# Patient Record
Sex: Female | Born: 1945 | Race: Black or African American | Hispanic: No | Marital: Married | Smoking: Former smoker
Health system: Southern US, Community
[De-identification: ages and names within clinical notes are randomized; demographics above are authoritative.]

## PROBLEM LIST (undated history)

## (undated) DIAGNOSIS — C801 Malignant (primary) neoplasm, unspecified: Secondary | ICD-10-CM

## (undated) DIAGNOSIS — M7500 Adhesive capsulitis of unspecified shoulder: Secondary | ICD-10-CM

## (undated) DIAGNOSIS — D649 Anemia, unspecified: Secondary | ICD-10-CM

## (undated) DIAGNOSIS — K219 Gastro-esophageal reflux disease without esophagitis: Secondary | ICD-10-CM

## (undated) DIAGNOSIS — E119 Type 2 diabetes mellitus without complications: Secondary | ICD-10-CM

## (undated) DIAGNOSIS — D638 Anemia in other chronic diseases classified elsewhere: Secondary | ICD-10-CM

## (undated) DIAGNOSIS — M75101 Unspecified rotator cuff tear or rupture of right shoulder, not specified as traumatic: Secondary | ICD-10-CM

## (undated) DIAGNOSIS — Z973 Presence of spectacles and contact lenses: Secondary | ICD-10-CM

## (undated) DIAGNOSIS — H409 Unspecified glaucoma: Secondary | ICD-10-CM

## (undated) DIAGNOSIS — E785 Hyperlipidemia, unspecified: Secondary | ICD-10-CM

## (undated) DIAGNOSIS — K222 Esophageal obstruction: Secondary | ICD-10-CM

## (undated) DIAGNOSIS — F419 Anxiety disorder, unspecified: Secondary | ICD-10-CM

## (undated) DIAGNOSIS — I10 Essential (primary) hypertension: Secondary | ICD-10-CM

## (undated) DIAGNOSIS — K449 Diaphragmatic hernia without obstruction or gangrene: Secondary | ICD-10-CM

## (undated) DIAGNOSIS — M1A9XX0 Chronic gout, unspecified, without tophus (tophi): Secondary | ICD-10-CM

## (undated) DIAGNOSIS — Z5189 Encounter for other specified aftercare: Secondary | ICD-10-CM

## (undated) HISTORY — DX: Diaphragmatic hernia without obstruction or gangrene: K44.9

## (undated) HISTORY — PX: COLONOSCOPY: SHX174

## (undated) HISTORY — PX: TUBAL LIGATION: SHX77

## (undated) HISTORY — DX: Anemia in other chronic diseases classified elsewhere: D63.8

## (undated) HISTORY — DX: Chronic gout, unspecified, without tophus (tophi): M1A.9XX0

## (undated) HISTORY — DX: Unspecified glaucoma: H40.9

## (undated) HISTORY — DX: Adhesive capsulitis of unspecified shoulder: M75.00

## (undated) HISTORY — DX: Unspecified rotator cuff tear or rupture of right shoulder, not specified as traumatic: M75.101

## (undated) HISTORY — DX: Encounter for other specified aftercare: Z51.89

## (undated) HISTORY — DX: Hyperlipidemia, unspecified: E78.5

## (undated) HISTORY — DX: Malignant (primary) neoplasm, unspecified: C80.1

## (undated) HISTORY — DX: Anemia, unspecified: D64.9

## (undated) HISTORY — DX: Esophageal obstruction: K22.2

## (undated) HISTORY — PX: REDUCTION MAMMAPLASTY: SUR839

## (undated) HISTORY — DX: Gastro-esophageal reflux disease without esophagitis: K21.9

## (undated) HISTORY — PX: OTHER SURGICAL HISTORY: SHX169

---

## 1988-11-18 HISTORY — PX: MASTECTOMY MODIFIED RADICAL: SUR848

## 1989-11-18 DIAGNOSIS — C801 Malignant (primary) neoplasm, unspecified: Secondary | ICD-10-CM

## 1989-11-18 HISTORY — DX: Malignant (primary) neoplasm, unspecified: C80.1

## 1998-11-18 HISTORY — PX: CATARACT EXTRACTION W/ INTRAOCULAR LENS  IMPLANT, BILATERAL: SHX1307

## 2006-10-02 ENCOUNTER — Encounter: Admission: RE | Admit: 2006-10-02 | Discharge: 2006-10-02 | Payer: Self-pay | Admitting: Obstetrics and Gynecology

## 2007-10-05 ENCOUNTER — Encounter: Admission: RE | Admit: 2007-10-05 | Discharge: 2007-10-05 | Payer: Self-pay | Admitting: Obstetrics & Gynecology

## 2008-10-05 ENCOUNTER — Encounter: Admission: RE | Admit: 2008-10-05 | Discharge: 2008-10-05 | Payer: Self-pay | Admitting: Obstetrics & Gynecology

## 2009-04-03 ENCOUNTER — Ambulatory Visit: Payer: Self-pay | Admitting: Internal Medicine

## 2009-04-03 DIAGNOSIS — E119 Type 2 diabetes mellitus without complications: Secondary | ICD-10-CM

## 2009-04-13 ENCOUNTER — Ambulatory Visit: Payer: Self-pay | Admitting: Internal Medicine

## 2009-04-13 ENCOUNTER — Encounter: Payer: Self-pay | Admitting: Internal Medicine

## 2009-04-15 ENCOUNTER — Encounter: Payer: Self-pay | Admitting: Internal Medicine

## 2009-10-09 ENCOUNTER — Encounter: Admission: RE | Admit: 2009-10-09 | Discharge: 2009-10-09 | Payer: Self-pay | Admitting: Obstetrics & Gynecology

## 2009-11-18 HISTORY — PX: EYE SURGERY: SHX253

## 2010-08-14 ENCOUNTER — Ambulatory Visit (HOSPITAL_COMMUNITY): Admission: RE | Admit: 2010-08-14 | Discharge: 2010-08-15 | Payer: Self-pay | Admitting: Ophthalmology

## 2010-10-17 ENCOUNTER — Encounter: Admission: RE | Admit: 2010-10-17 | Discharge: 2010-10-17 | Payer: Self-pay | Admitting: Obstetrics & Gynecology

## 2011-01-31 LAB — GLUCOSE, CAPILLARY
Glucose-Capillary: 111 mg/dL — ABNORMAL HIGH (ref 70–99)
Glucose-Capillary: 122 mg/dL — ABNORMAL HIGH (ref 70–99)
Glucose-Capillary: 247 mg/dL — ABNORMAL HIGH (ref 70–99)
Glucose-Capillary: 248 mg/dL — ABNORMAL HIGH (ref 70–99)

## 2011-01-31 LAB — CBC
MCH: 26.5 pg (ref 26.0–34.0)
MCHC: 32.4 g/dL (ref 30.0–36.0)
Platelets: 367 10*3/uL (ref 150–400)
RDW: 15.2 % (ref 11.5–15.5)

## 2011-01-31 LAB — BASIC METABOLIC PANEL
BUN: 18 mg/dL (ref 6–23)
CO2: 25 mEq/L (ref 19–32)
Calcium: 10 mg/dL (ref 8.4–10.5)
Creatinine, Ser: 0.92 mg/dL (ref 0.4–1.2)
GFR calc non Af Amer: >60 mL/min (ref >60)
Glucose, Bld: 172 mg/dL — ABNORMAL HIGH (ref 70–99)

## 2011-01-31 LAB — SURGICAL PCR SCREEN
MRSA, PCR: NEGATIVE
Staphylococcus aureus: NEGATIVE

## 2011-02-26 LAB — GLUCOSE, CAPILLARY: Glucose-Capillary: 152 mg/dL — ABNORMAL HIGH (ref 70–99)

## 2011-04-18 ENCOUNTER — Encounter: Payer: Self-pay | Admitting: *Deleted

## 2011-09-13 ENCOUNTER — Other Ambulatory Visit: Payer: Self-pay | Admitting: Obstetrics & Gynecology

## 2011-09-13 DIAGNOSIS — Z1231 Encounter for screening mammogram for malignant neoplasm of breast: Secondary | ICD-10-CM

## 2011-10-21 ENCOUNTER — Ambulatory Visit
Admission: RE | Admit: 2011-10-21 | Discharge: 2011-10-21 | Disposition: A | Discharge status: Home / Self Care | Payer: Self-pay | Source: Ambulatory Visit | Attending: Obstetrics & Gynecology | Admitting: Obstetrics & Gynecology

## 2011-10-21 DIAGNOSIS — Z1231 Encounter for screening mammogram for malignant neoplasm of breast: Secondary | ICD-10-CM

## 2011-11-26 ENCOUNTER — Ambulatory Visit (INDEPENDENT_AMBULATORY_CARE_PROVIDER_SITE_OTHER): Payer: Medicare Other | Admitting: Ophthalmology

## 2011-11-26 DIAGNOSIS — E1165 Type 2 diabetes mellitus with hyperglycemia: Secondary | ICD-10-CM

## 2011-11-26 DIAGNOSIS — E1139 Type 2 diabetes mellitus with other diabetic ophthalmic complication: Secondary | ICD-10-CM

## 2011-11-26 DIAGNOSIS — H43819 Vitreous degeneration, unspecified eye: Secondary | ICD-10-CM

## 2011-11-26 DIAGNOSIS — E11359 Type 2 diabetes mellitus with proliferative diabetic retinopathy without macular edema: Secondary | ICD-10-CM

## 2011-11-26 DIAGNOSIS — H33009 Unspecified retinal detachment with retinal break, unspecified eye: Secondary | ICD-10-CM

## 2012-05-25 ENCOUNTER — Ambulatory Visit (INDEPENDENT_AMBULATORY_CARE_PROVIDER_SITE_OTHER): Payer: Medicare Other | Admitting: Ophthalmology

## 2012-05-25 DIAGNOSIS — H33009 Unspecified retinal detachment with retinal break, unspecified eye: Secondary | ICD-10-CM

## 2012-05-25 DIAGNOSIS — H43819 Vitreous degeneration, unspecified eye: Secondary | ICD-10-CM

## 2012-05-25 DIAGNOSIS — E11359 Type 2 diabetes mellitus with proliferative diabetic retinopathy without macular edema: Secondary | ICD-10-CM

## 2012-05-25 DIAGNOSIS — E1139 Type 2 diabetes mellitus with other diabetic ophthalmic complication: Secondary | ICD-10-CM

## 2012-06-25 DIAGNOSIS — H02409 Unspecified ptosis of unspecified eyelid: Secondary | ICD-10-CM | POA: Insufficient documentation

## 2012-09-22 ENCOUNTER — Other Ambulatory Visit: Payer: Self-pay | Admitting: Obstetrics & Gynecology

## 2012-09-22 DIAGNOSIS — Z1231 Encounter for screening mammogram for malignant neoplasm of breast: Secondary | ICD-10-CM

## 2012-09-22 DIAGNOSIS — Z853 Personal history of malignant neoplasm of breast: Secondary | ICD-10-CM

## 2012-09-22 DIAGNOSIS — Z9011 Acquired absence of right breast and nipple: Secondary | ICD-10-CM

## 2012-10-28 ENCOUNTER — Ambulatory Visit
Admission: RE | Admit: 2012-10-28 | Discharge: 2012-10-28 | Disposition: A | Discharge status: Home / Self Care | Payer: Medicare Other | Source: Ambulatory Visit | Attending: Obstetrics & Gynecology | Admitting: Obstetrics & Gynecology

## 2012-10-28 DIAGNOSIS — Z1231 Encounter for screening mammogram for malignant neoplasm of breast: Secondary | ICD-10-CM

## 2012-10-28 DIAGNOSIS — Z9011 Acquired absence of right breast and nipple: Secondary | ICD-10-CM

## 2012-10-28 DIAGNOSIS — Z853 Personal history of malignant neoplasm of breast: Secondary | ICD-10-CM

## 2012-11-25 ENCOUNTER — Ambulatory Visit (INDEPENDENT_AMBULATORY_CARE_PROVIDER_SITE_OTHER): Payer: Medicare Other | Admitting: Ophthalmology

## 2012-11-25 DIAGNOSIS — E1139 Type 2 diabetes mellitus with other diabetic ophthalmic complication: Secondary | ICD-10-CM

## 2012-11-25 DIAGNOSIS — H33009 Unspecified retinal detachment with retinal break, unspecified eye: Secondary | ICD-10-CM

## 2012-11-25 DIAGNOSIS — I10 Essential (primary) hypertension: Secondary | ICD-10-CM

## 2012-11-25 DIAGNOSIS — H35039 Hypertensive retinopathy, unspecified eye: Secondary | ICD-10-CM

## 2012-11-25 DIAGNOSIS — E1165 Type 2 diabetes mellitus with hyperglycemia: Secondary | ICD-10-CM

## 2012-11-25 DIAGNOSIS — H26499 Other secondary cataract, unspecified eye: Secondary | ICD-10-CM

## 2012-11-25 DIAGNOSIS — H43819 Vitreous degeneration, unspecified eye: Secondary | ICD-10-CM

## 2012-11-25 DIAGNOSIS — E11359 Type 2 diabetes mellitus with proliferative diabetic retinopathy without macular edema: Secondary | ICD-10-CM

## 2012-12-11 ENCOUNTER — Ambulatory Visit (INDEPENDENT_AMBULATORY_CARE_PROVIDER_SITE_OTHER): Payer: Medicare Other | Admitting: Ophthalmology

## 2012-12-11 DIAGNOSIS — H2700 Aphakia, unspecified eye: Secondary | ICD-10-CM

## 2013-06-15 ENCOUNTER — Other Ambulatory Visit: Payer: Self-pay | Admitting: Orthopedic Surgery

## 2013-06-18 ENCOUNTER — Encounter (HOSPITAL_BASED_OUTPATIENT_CLINIC_OR_DEPARTMENT_OTHER): Payer: Self-pay | Admitting: *Deleted

## 2013-06-18 NOTE — Progress Notes (Signed)
To come in for [id number]  

## 2013-06-22 ENCOUNTER — Encounter (HOSPITAL_BASED_OUTPATIENT_CLINIC_OR_DEPARTMENT_OTHER)
Admission: RE | Admit: 2013-06-22 | Discharge: 2013-06-22 | Disposition: A | Discharge status: Home / Self Care | Payer: Medicare Other | Source: Ambulatory Visit | Attending: Orthopedic Surgery | Admitting: Orthopedic Surgery

## 2013-06-22 LAB — BASIC METABOLIC PANEL
CO2: 27 mEq/L (ref 19–32)
Calcium: 10.8 mg/dL — ABNORMAL HIGH (ref 8.4–10.5)
Creatinine, Ser: 1.22 mg/dL — ABNORMAL HIGH (ref 0.50–1.10)
Glucose, Bld: 132 mg/dL — ABNORMAL HIGH (ref 70–99)

## 2013-06-23 NOTE — H&P (Signed)
Teresa Barnes is an 67 y.o. female.   Chief Complaint: c/o chronic and progressive STS symptoms of the right long finger HPI:  She is 67 years-old, right-hand dominant, she had strained her hand when she fell in the garden 04/10/13.  She noted locking of her right long finger. She saw you for evaluation and was on a short course of prednisone without relief. She noted her sugars became rather elevated on prednisone.  She has type II diabetes. She does not recall her hemoglobin A1C.   She is now referred for an upper extremity orthopaedic consult.      Past Medical History  Diagnosis Date  . Diabetes mellitus without complication   . Hypertension   . Wears glasses     Past Surgical History  Procedure Laterality Date  . Tubal ligation    . Colonoscopy    . Eye surgery  2011    lt eye blled    History reviewed. No pertinent family history. Social History:  reports that she quit smoking about 20 years ago. She has never used smokeless tobacco. She reports that she does not drink alcohol or use illicit drugs.  Allergies: No Known Allergies  No prescriptions prior to admission    Results for orders placed during the hospital encounter of 06/24/13 (from the past 48 hour(s))  BASIC METABOLIC PANEL     Status: Abnormal   Collection Time    06/22/13 12:00 PM      Result Value Range   Sodium 138  135 - 145 mEq/L   Potassium 4.3  3.5 - 5.1 mEq/L   Chloride 100  96 - 112 mEq/L   CO2 27  19 - 32 mEq/L   Glucose, Bld 132 (*) 70 - 99 mg/dL   BUN 20  6 - 23 mg/dL   Creatinine, Ser 1.22 (*) 0.50 - 1.10 mg/dL   Calcium 10.8 (*) 8.4 - 10.5 mg/dL   GFR calc non Af Amer 44 (*) >90 mL/min   GFR calc Af Amer 52 (*) >90 mL/min   Comment:            The eGFR has been calculated     using the CKD EPI equation.     This calculation has not been     validated in all clinical     situations.     eGFR's persistently     <90 mL/min signify     possible Chronic Kidney Disease.    No results  found.   Pertinent items are noted in HPI.  Height 5\' 4"  (1.626 m), weight 86.183 kg (190 lb).  General appearance: alert Head: Normocephalic, without obvious abnormality Neck: supple, symmetrical, trachea midline Resp: clear to auscultation bilaterally Cardio: regular rate and rhythm GI: normal findings: bowel sounds normal Extremities:  Inspection of her hands reveals no deformity.  She has active triggering of her right long finger.  She has minor flexion contracture of the PIP joint.  She has full range of motion of her thumbs all fingers left hand and no sign of stenosing tenosynovitis of the first dorsal compartment.  Pulses: 2+ and symmetric Skin: normal Neurologic: Grossly normal  Assessment/Plan Impression: Right long finger STS  Plan: TO the OR for release A-1 pulley right long finger.The procedure, risks,benefits and post-op course were discussed with the patient at length and they were in agreement with the plan.  DASNOIT,Kawanna Christley J 06/23/2013, 1:35 PM   H&P documentation: 06/24/2013  -History and Physical Reviewed  -Patient  has been re-examined  -No change in the plan of care  Cammie Sickle, MD

## 2013-06-24 ENCOUNTER — Ambulatory Visit (HOSPITAL_BASED_OUTPATIENT_CLINIC_OR_DEPARTMENT_OTHER): Payer: Medicare Other | Admitting: Anesthesiology

## 2013-06-24 ENCOUNTER — Ambulatory Visit (HOSPITAL_BASED_OUTPATIENT_CLINIC_OR_DEPARTMENT_OTHER)
Admission: RE | Admit: 2013-06-24 | Discharge: 2013-06-24 | Disposition: A | Discharge status: Home / Self Care | Payer: Medicare Other | Source: Ambulatory Visit | Attending: Orthopedic Surgery | Admitting: Orthopedic Surgery

## 2013-06-24 ENCOUNTER — Encounter (HOSPITAL_BASED_OUTPATIENT_CLINIC_OR_DEPARTMENT_OTHER): Payer: Self-pay | Admitting: *Deleted

## 2013-06-24 ENCOUNTER — Encounter (HOSPITAL_BASED_OUTPATIENT_CLINIC_OR_DEPARTMENT_OTHER)
Admission: RE | Disposition: A | Discharge status: Home / Self Care | Payer: Self-pay | Source: Ambulatory Visit | Attending: Orthopedic Surgery

## 2013-06-24 ENCOUNTER — Encounter (HOSPITAL_BASED_OUTPATIENT_CLINIC_OR_DEPARTMENT_OTHER): Payer: Self-pay | Admitting: Anesthesiology

## 2013-06-24 DIAGNOSIS — Z87891 Personal history of nicotine dependence: Secondary | ICD-10-CM | POA: Insufficient documentation

## 2013-06-24 DIAGNOSIS — M729 Fibroblastic disorder, unspecified: Secondary | ICD-10-CM | POA: Insufficient documentation

## 2013-06-24 DIAGNOSIS — M24549 Contracture, unspecified hand: Secondary | ICD-10-CM | POA: Insufficient documentation

## 2013-06-24 DIAGNOSIS — I10 Essential (primary) hypertension: Secondary | ICD-10-CM | POA: Insufficient documentation

## 2013-06-24 DIAGNOSIS — E119 Type 2 diabetes mellitus without complications: Secondary | ICD-10-CM | POA: Insufficient documentation

## 2013-06-24 DIAGNOSIS — M6530 Trigger finger, unspecified finger: Secondary | ICD-10-CM | POA: Insufficient documentation

## 2013-06-24 HISTORY — PX: TRIGGER FINGER RELEASE: SHX641

## 2013-06-24 HISTORY — DX: Type 2 diabetes mellitus without complications: E11.9

## 2013-06-24 HISTORY — DX: Presence of spectacles and contact lenses: Z97.3

## 2013-06-24 HISTORY — DX: Essential (primary) hypertension: I10

## 2013-06-24 SURGERY — RELEASE, A1 PULLEY, FOR TRIGGER FINGER
Anesthesia: Monitor Anesthesia Care | Site: Hand | Laterality: Right | Wound class: Clean

## 2013-06-24 MED ORDER — HYDROCODONE-ACETAMINOPHEN 5-325 MG PO TABS: 1.0000 | ORAL_TABLET | ORAL | Status: DC | PRN

## 2013-06-24 MED ORDER — CHLORHEXIDINE GLUCONATE 4 % EX LIQD
60.0000 mL | Freq: Once | CUTANEOUS | Status: DC
Start: 2013-06-24 — End: 2013-06-24

## 2013-06-24 MED ORDER — LIDOCAINE HCL 2 % IJ SOLN
INTRAMUSCULAR | Status: DC | PRN
  Administered 2013-06-24: 2.5 mL

## 2013-06-24 MED ORDER — MIDAZOLAM HCL 5 MG/5ML IJ SOLN
INTRAMUSCULAR | Status: DC | PRN
  Administered 2013-06-24: 1 mg via INTRAVENOUS

## 2013-06-24 MED ORDER — OXYCODONE HCL 5 MG/5ML PO SOLN: 5.0000 mg | Freq: Once | ORAL | Status: DC | PRN

## 2013-06-24 MED ORDER — PROPOFOL INFUSION 10 MG/ML OPTIME
INTRAVENOUS | Status: DC | PRN
  Administered 2013-06-24: 100 ug/kg/min via INTRAVENOUS

## 2013-06-24 MED ORDER — DEXAMETHASONE SODIUM PHOSPHATE 10 MG/ML IJ SOLN
INTRAMUSCULAR | Status: DC | PRN
  Administered 2013-06-24: 10 mg via INTRAVENOUS

## 2013-06-24 MED ORDER — FENTANYL CITRATE 0.05 MG/ML IJ SOLN
INTRAMUSCULAR | Status: DC | PRN
  Administered 2013-06-24: 50 ug via INTRAVENOUS

## 2013-06-24 MED ORDER — FENTANYL CITRATE 0.05 MG/ML IJ SOLN: 50.0000 ug | INTRAMUSCULAR | Status: DC | PRN

## 2013-06-24 MED ORDER — LIDOCAINE HCL (CARDIAC) 20 MG/ML IV SOLN
INTRAVENOUS | Status: DC | PRN
  Administered 2013-06-24: 50 mg via INTRAVENOUS

## 2013-06-24 MED ORDER — HYDROMORPHONE HCL PF 1 MG/ML IJ SOLN: 0.2500 mg | INTRAMUSCULAR | Status: DC | PRN

## 2013-06-24 MED ORDER — ONDANSETRON HCL 4 MG/2ML IJ SOLN
INTRAMUSCULAR | Status: DC | PRN
  Administered 2013-06-24: 4 mg via INTRAVENOUS

## 2013-06-24 MED ORDER — ONDANSETRON HCL 4 MG/2ML IJ SOLN: 4.0000 mg | Freq: Once | INTRAMUSCULAR | Status: DC | PRN

## 2013-06-24 MED ORDER — MIDAZOLAM HCL 2 MG/2ML IJ SOLN: 1.0000 mg | INTRAMUSCULAR | Status: DC | PRN

## 2013-06-24 MED ORDER — OXYCODONE HCL 5 MG PO TABS: 5.0000 mg | ORAL_TABLET | Freq: Once | ORAL | Status: DC | PRN

## 2013-06-24 MED ORDER — LACTATED RINGERS IV SOLN
INTRAVENOUS | Status: DC
  Administered 2013-06-24: 07:00:00 via INTRAVENOUS

## 2013-06-24 SURGICAL SUPPLY — 38 items
BANDAGE ADHESIVE 1X3 (GAUZE/BANDAGES/DRESSINGS) IMPLANT
BANDAGE COBAN STERILE 2 (GAUZE/BANDAGES/DRESSINGS) ×2 IMPLANT
BLADE SURG 15 STRL LF DISP TIS (BLADE) ×1 IMPLANT
BLADE SURG 15 STRL SS (BLADE) ×1
BNDG ESMARK 4X9 LF (GAUZE/BANDAGES/DRESSINGS) ×2 IMPLANT
BRUSH SCRUB EZ PLAIN DRY (MISCELLANEOUS) ×2 IMPLANT
CLOTH BEACON ORANGE TIMEOUT ST (SAFETY) ×2 IMPLANT
CORDS BIPOLAR (ELECTRODE) IMPLANT
COVER MAYO STAND STRL (DRAPES) ×2 IMPLANT
COVER TABLE BACK 60X90 (DRAPES) ×2 IMPLANT
CUFF TOURNIQUET SINGLE 18IN (TOURNIQUET CUFF) ×2 IMPLANT
DECANTER SPIKE VIAL GLASS SM (MISCELLANEOUS) IMPLANT
DRAPE EXTREMITY T 121X128X90 (DRAPE) ×2 IMPLANT
DRAPE SURG 17X23 STRL (DRAPES) ×2 IMPLANT
GLOVE BIO SURGEON STRL SZ7 (GLOVE) ×2 IMPLANT
GLOVE BIOGEL M STRL SZ7.5 (GLOVE) IMPLANT
GLOVE BIOGEL PI IND STRL 7.5 (GLOVE) ×1 IMPLANT
GLOVE BIOGEL PI INDICATOR 7.5 (GLOVE) ×1
GLOVE EXAM NITRILE LRG STRL (GLOVE) ×2 IMPLANT
GLOVE ORTHO TXT STRL SZ7.5 (GLOVE) ×2 IMPLANT
GOWN BRE IMP PREV XXLGXLNG (GOWN DISPOSABLE) ×2 IMPLANT
GOWN PREVENTION PLUS XLARGE (GOWN DISPOSABLE) ×2 IMPLANT
NEEDLE 27GAX1X1/2 (NEEDLE) ×2 IMPLANT
PACK BASIN DAY SURGERY FS (CUSTOM PROCEDURE TRAY) ×2 IMPLANT
PADDING CAST ABS 4INX4YD NS (CAST SUPPLIES) ×1
PADDING CAST ABS COTTON 4X4 ST (CAST SUPPLIES) ×1 IMPLANT
SPONGE GAUZE 4X4 12PLY (GAUZE/BANDAGES/DRESSINGS) ×2 IMPLANT
STOCKINETTE 4X48 STRL (DRAPES) ×2 IMPLANT
STRIP CLOSURE SKIN 1/2X4 (GAUZE/BANDAGES/DRESSINGS) ×2 IMPLANT
SUT ETHILON 5 0 P 3 18 (SUTURE)
SUT NYLON ETHILON 5-0 P-3 1X18 (SUTURE) IMPLANT
SUT PROLENE 3 0 PS 2 (SUTURE) IMPLANT
SUT PROLENE 4 0 P 3 18 (SUTURE) ×2 IMPLANT
SYR 3ML 23GX1 SAFETY (SYRINGE) IMPLANT
SYR CONTROL 10ML LL (SYRINGE) ×2 IMPLANT
TOWEL OR 17X24 6PK STRL BLUE (TOWEL DISPOSABLE) ×2 IMPLANT
TRAY DSU PREP LF (CUSTOM PROCEDURE TRAY) ×2 IMPLANT
UNDERPAD 30X30 INCONTINENT (UNDERPADS AND DIAPERS) ×2 IMPLANT

## 2013-06-24 NOTE — Transfer of Care (Signed)
Immediate Anesthesia Transfer of Care Note  Patient: Teresa Barnes  Procedure(s) Performed: Procedure(s): RELEASE TRIGGER FINGER/A-1 PULLEY PIP RIGHT LONG FINGER (Right) 2 Patient Location: PACU  Anesthesia Type:MAC  Level of Consciousness: awake and alert   Airway & Oxygen Therapy: Patient Spontanous Breathing and Patient connected to face mask oxygen  Post-op Assessment: Report given to PACU RN and Post -op Vital signs reviewed and stable  Post vital signs: Reviewed and stable  Complications: No apparent anesthesia complications

## 2013-06-24 NOTE — Anesthesia Preprocedure Evaluation (Signed)
Anesthesia Evaluation  Patient identified by MRN, date of birth, ID band Patient awake    Reviewed: Allergy & Precautions, H&P , NPO status , Patient's Chart, lab work & pertinent test results  Airway Mallampati: I TM Distance: >3 FB Neck ROM: Full    Dental  (+) Teeth Intact and Dental Advisory Given   Pulmonary  breath sounds clear to auscultation        Cardiovascular hypertension, Pt. on medications Rhythm:Regular Rate:Normal     Neuro/Psych    GI/Hepatic   Endo/Other  diabetes, Well Controlled, Type 2, Oral Hypoglycemic Agents  Renal/GU      Musculoskeletal   Abdominal   Peds  Hematology   Anesthesia Other Findings   Reproductive/Obstetrics                           Anesthesia Physical Anesthesia Plan  ASA: III  Anesthesia Plan: MAC   Post-op Pain Management:    Induction: Intravenous  Airway Management Planned: Simple Face Mask  Additional Equipment:   Intra-op Plan:   Post-operative Plan: Extubation in OR  Informed Consent: I have reviewed the patients History and Physical, chart, labs and discussed the procedure including the risks, benefits and alternatives for the proposed anesthesia with the patient or authorized representative who has indicated his/her understanding and acceptance.   Dental advisory given  Plan Discussed with: CRNA, Anesthesiologist and Surgeon  Anesthesia Plan Comments:         Anesthesia Quick Evaluation

## 2013-06-24 NOTE — Op Note (Signed)
[id number] 

## 2013-06-24 NOTE — Anesthesia Postprocedure Evaluation (Signed)
  Anesthesia Post-op Note  Patient: Teresa Barnes  Procedure(s) Performed: Procedure(s): RELEASE TRIGGER FINGER/A-1 PULLEY PIP RIGHT LONG FINGER (Right)  Patient Location: PACU  Anesthesia Type:MAC  Level of Consciousness: awake, alert  and oriented  Airway and Oxygen Therapy: Patient Spontanous Breathing and Patient connected to face mask oxygen  Post-op Pain: none  Post-op Assessment: Post-op Vital signs reviewed  Post-op Vital Signs: Reviewed  Complications: No apparent anesthesia complications

## 2013-06-24 NOTE — Op Note (Signed)
Teresa Barnes                 ACCOUNT NO.:  [id number]  MEDICAL RECORD NO.:  [id number]  LOCATION:                               FACILITY:  [facility]  PHYSICIAN:  Teresa Barnes, M.D. 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  OPERATION:  Release of right long finger A1 pulley and gentle manipulation of the PIP joint.  OPERATING SURGEON:  Teresa Barnes. Brennan Karam, MD  ASSISTANT:  Teresa Barnes.  ANESTHESIA:  A 2% lidocaine flexor sheath block and palmar block right long finger supplemented by IV sedation.  SUPERVISING ANESTHESIOLOGIST:  Teresa Reid, MD.  INDICATION:  Teresa Barnes is a 67 year old woman referred through the courtesy of Teresa Barnes endocrinology/internal medicine physician who has a history of chronic stenosing tenosynovitis of Teresa right long finger at the A1 pulley.  She has a history of over using Teresa hand in the garden at home as well as background diabetes and hypertension.  She was referred by Teresa Barnes after failure of prednisone therapy for this predicament.  Clinical examination revealed a 10 degree flexion fracture of the right long finger PIP joint, palmar fibromatosis forming, and obvious triggering at the A1 pulley of the right long finger.  Due to Teresa. Kilman having an intolerance to steroids as well as with Teresa diabetes care and the flexion contracture being noted we recommended proceeding directly to release of the A1 pulley.  After detailed informed consent, she is brought to the operating room at this time.  PROCEDURE:  Teresa Barnes was  brought to room 6 of the [facility] and placed in supine position on the operating table.  In the holding area, Teresa Barnes was identified per protocol with marking pen and curiously a "restricted extremity"arm band was removed.  It was apparent that this was placed due to a nursing protocol that identified extremities that had been subjected to prior breast surgery.  We understand from a knowledge of the contemporary Medical literature that there was no risk to providing surgical services to a limb having had prior breast surgery.  After questions were invited and answered with Teresa Barnes, IV sedation was provided under Teresa Barnes' direct supervision, followed by infiltration of 2% lidocaine into the path of intended incision and the right long finger flexor sheath.  Following routine Betadine scrub and paint and application of a pneumatic tourniquet to the proximal right brachium, the right hand and arm were exsanguinated with an Esmarch bandage and the arterial tourniquet on proximal brachium inflated to 220 mmHg.  Following routine surgical time-out, procedure commenced with an oblique incision directly over the A1 pulley.  Subcutaneous tissues were carefully divided taking care to identify the pretendinous fibers of the palmar fascia.  These were dissected free and released with scissors.  The fascia overlying  the A1 pulley was excised with a rongeur followed by release of the A1 pulley along its radial border.  The flexor tendons were delivered and found to be invested in a cuff of fibrotic tenosynovium which was excised with scissors dissection.  The finger was then ranged and the flexion contracture decreased to 5 degrees. With the MP joint fully flexed, we gently manipulated the PIP joint in full extension.  Once a full range of motion of the finger was noted, the wound was repaired with intradermal 4-0 Prolene and Steri-Strips.  A compressive dressing  was applied with sterile gauze and Coban.  There were no complications.  For aftercare, Teresa Barnes was provided a prescription for Norco 5 mg 1 p.o. q.4-6 hours p.r.n. pain, 20 tablets without refill.  We will see Teresa back for followup in the office in 1 week for dressing change, suture removal, and advancement to an exercise program.     Teresa Barnes. Jamie Hafford, M.D.     RVS/MEDQ  D:  06/24/2013  T:  06/24/2013  Job:  [id number]  cc:   Teresa Barnes, M.D.

## 2013-06-24 NOTE — Brief Op Note (Signed)
06/24/2013  8:09 AM  PATIENT:  Teresa Barnes  67 y.o. female  PRE-OPERATIVE DIAGNOSIS:  RIGHT LONG STS WITH PIP CONTRACTURE  POST-OPERATIVE DIAGNOSIS:  right long stenosing tenosynovitis with PIP contracture  PROCEDURE:  Procedure(s): RELEASE TRIGGER FINGER/A-1 PULLEY PIP RIGHT LONG FINGER (Right)  SURGEON:  Surgeon(s) and Role:    * Cammie Sickle., MD - Primary  PHYSICIAN ASSISTANT:   ASSISTANTS: Registered nurse  ANESTHESIA:   IV sedation  EBL:  Total I/O In: 800 [I.V.:800] Out: -   BLOOD ADMINISTERED:none  DRAINS: none   LOCAL MEDICATIONS USED:  LIDOCAINE   SPECIMEN:  No Specimen  DISPOSITION OF SPECIMEN:  N/A  COUNTS:  YES  TOURNIQUET:    DICTATION: .Other Dictation: Dictation Number [id number]  PLAN OF CARE: Discharge to home after PACU  PATIENT DISPOSITION:  PACU - hemodynamically stable.   Delay start of Pharmacological VTE agent (>24hrs) due to surgical blood loss or risk of bleeding: not applicable

## 2013-06-25 ENCOUNTER — Encounter (HOSPITAL_BASED_OUTPATIENT_CLINIC_OR_DEPARTMENT_OTHER): Payer: Self-pay | Admitting: Orthopedic Surgery

## 2013-09-10 ENCOUNTER — Ambulatory Visit (INDEPENDENT_AMBULATORY_CARE_PROVIDER_SITE_OTHER): Payer: Medicare Other | Admitting: Ophthalmology

## 2013-09-10 DIAGNOSIS — E11359 Type 2 diabetes mellitus with proliferative diabetic retinopathy without macular edema: Secondary | ICD-10-CM

## 2013-09-10 DIAGNOSIS — H33009 Unspecified retinal detachment with retinal break, unspecified eye: Secondary | ICD-10-CM

## 2013-09-10 DIAGNOSIS — H35039 Hypertensive retinopathy, unspecified eye: Secondary | ICD-10-CM

## 2013-09-10 DIAGNOSIS — E1139 Type 2 diabetes mellitus with other diabetic ophthalmic complication: Secondary | ICD-10-CM

## 2013-09-10 DIAGNOSIS — I10 Essential (primary) hypertension: Secondary | ICD-10-CM

## 2013-09-10 DIAGNOSIS — H43819 Vitreous degeneration, unspecified eye: Secondary | ICD-10-CM

## 2013-10-04 ENCOUNTER — Other Ambulatory Visit: Payer: Self-pay

## 2013-10-04 DIAGNOSIS — Z9011 Acquired absence of right breast and nipple: Secondary | ICD-10-CM

## 2013-10-04 DIAGNOSIS — Z1231 Encounter for screening mammogram for malignant neoplasm of breast: Secondary | ICD-10-CM

## 2013-10-28 ENCOUNTER — Ambulatory Visit: Payer: Self-pay | Admitting: Podiatry

## 2013-10-29 ENCOUNTER — Ambulatory Visit
Admission: RE | Admit: 2013-10-29 | Discharge: 2013-10-29 | Disposition: A | Discharge status: Home / Self Care | Payer: Medicare Other | Source: Ambulatory Visit

## 2013-10-29 DIAGNOSIS — Z1231 Encounter for screening mammogram for malignant neoplasm of breast: Secondary | ICD-10-CM

## 2013-10-29 DIAGNOSIS — Z9011 Acquired absence of right breast and nipple: Secondary | ICD-10-CM

## 2013-11-08 ENCOUNTER — Encounter: Payer: Self-pay | Admitting: Podiatry

## 2013-11-08 ENCOUNTER — Ambulatory Visit (INDEPENDENT_AMBULATORY_CARE_PROVIDER_SITE_OTHER): Payer: Medicare Other | Admitting: Podiatry

## 2013-11-08 VITALS — BP 129/74 | HR 60 | Resp 12

## 2013-11-08 DIAGNOSIS — M79609 Pain in unspecified limb: Secondary | ICD-10-CM

## 2013-11-08 DIAGNOSIS — B351 Tinea unguium: Secondary | ICD-10-CM

## 2013-11-08 NOTE — Progress Notes (Signed)
Subjective:     Patient ID: Teresa Barnes, female   DOB: Mar 09, 1946, 67 y.o.   MRN: [id number]  HPI patient is found to have nail disease with pain 1-5 both feet   Review of Systems     Objective:   Physical Exam Neurovascular status unchanged with no health history changes noted. Nail disease with thickness and pain 1-5 both feet    Assessment:     Mycotic nail infection with pain 1-5 both feet    Plan:     Debridement of painful nail bed 1-5 both feet with no bleeding noted

## 2014-01-31 ENCOUNTER — Ambulatory Visit (INDEPENDENT_AMBULATORY_CARE_PROVIDER_SITE_OTHER): Payer: Medicare Other | Admitting: Podiatry

## 2014-01-31 ENCOUNTER — Encounter: Payer: Self-pay | Admitting: Podiatry

## 2014-01-31 VITALS — BP 109/60 | HR 66 | Resp 12

## 2014-01-31 DIAGNOSIS — B351 Tinea unguium: Secondary | ICD-10-CM

## 2014-01-31 DIAGNOSIS — M79609 Pain in unspecified limb: Secondary | ICD-10-CM

## 2014-02-02 NOTE — Progress Notes (Signed)
Subjective:     Patient ID: Teresa Barnes, female   DOB: 08/05/1946, 68 y.o.   MRN: [id number]  HPI patient presents with thick nailbeds 1-5 both feet that she can not cut and she states her sore in shoes   Review of Systems     Objective:   Physical Exam Patient has thick painful nail bed 1-5 both feet that she cannot cut    Assessment:     Mycotic nail infection with pain 1-5 both feet    Plan:     Debridement painful nailbeds 1-5 both feet with no bleeding noted

## 2014-03-09 ENCOUNTER — Encounter: Payer: Self-pay | Admitting: Internal Medicine

## 2014-03-29 ENCOUNTER — Encounter: Payer: Self-pay | Admitting: Internal Medicine

## 2014-05-09 ENCOUNTER — Ambulatory Visit (INDEPENDENT_AMBULATORY_CARE_PROVIDER_SITE_OTHER): Payer: Medicare Other | Admitting: Podiatry

## 2014-05-09 ENCOUNTER — Encounter: Payer: Self-pay | Admitting: Podiatry

## 2014-05-09 DIAGNOSIS — B351 Tinea unguium: Secondary | ICD-10-CM

## 2014-05-09 DIAGNOSIS — M79673 Pain in unspecified foot: Secondary | ICD-10-CM

## 2014-05-09 DIAGNOSIS — M79609 Pain in unspecified limb: Secondary | ICD-10-CM

## 2014-05-09 NOTE — Progress Notes (Signed)
Subjective:     Patient ID: Teresa Barnes, female   DOB: [birth year], 68 y.o.   MRN: [id number]  HPI patient presents with thick yellow nailbeds 1-5 both feet that she cannot cut herself and are painful   Review of Systems     Objective:   Physical Exam Neurovascular status found to be unchanged with thick yellow brittle toenails 1-5 both feet that are painful    Assessment:     Mycotic nail infection with pain 1-5 both feet    Plan:     Debris painful nailbeds 1-5 both feet with no iatrogenic bleeding noted

## 2014-05-09 NOTE — Patient Instructions (Signed)
Diabetes and Foot Care Diabetes may cause you to have problems because of poor blood supply (circulation) to your feet and legs. This may cause the skin on your feet to become thinner, break easier, and heal more slowly. Your skin may become dry, and the skin may peel and crack. You may also have nerve damage in your legs and feet causing decreased feeling in them. You may not notice minor injuries to your feet that could lead to infections or more serious problems. Taking care of your feet is one of the most important things you can do for yourself.  HOME CARE INSTRUCTIONS  Wear shoes at all times, even in the house. Do not go barefoot. Bare feet are easily injured.  Check your feet daily for blisters, cuts, and redness. If you cannot see the bottom of your feet, use a mirror or ask someone for help.  Wash your feet with warm water (do not use hot water) and mild soap. Then pat your feet and the areas between your toes until they are completely dry. Do not soak your feet as this can dry your skin.  Apply a moisturizing lotion or petroleum jelly (that does not contain alcohol and is unscented) to the skin on your feet and to dry, brittle toenails. Do not apply lotion between your toes.  Trim your toenails straight across. Do not dig under them or around the cuticle. File the edges of your nails with an emery board or nail file.  Do not cut corns or calluses or try to remove them with medicine.  Wear clean socks or stockings every day. Make sure they are not too tight. Do not wear knee-high stockings since they may decrease blood flow to your legs.  Wear shoes that fit properly and have enough cushioning. To break in new shoes, wear them for just a few hours a day. This prevents you from injuring your feet. Always look in your shoes before you put them on to be sure there are no objects inside.  Do not cross your legs. This may decrease the blood flow to your feet.  If you find a minor scrape,  cut, or break in the skin on your feet, keep it and the skin around it clean and dry. These areas may be cleansed with mild soap and water. Do not cleanse the area with peroxide, alcohol, or iodine.  When you remove an adhesive bandage, be sure not to damage the skin around it.  If you have a wound, look at it several times a day to make sure it is healing.  Do not use heating pads or hot water bottles. They may burn your skin. If you have lost feeling in your feet or legs, you may not know it is happening until it is too late.  Make sure your health care provider performs a complete foot exam at least annually or more often if you have foot problems. Report any cuts, sores, or bruises to your health care provider immediately. SEEK MEDICAL CARE IF:   You have an injury that is not healing.  You have cuts or breaks in the skin.  You have an ingrown nail.  You notice redness on your legs or feet.  You feel burning or tingling in your legs or feet.  You have pain or cramps in your legs and feet.  Your legs or feet are numb.  Your feet always feel cold. SEEK IMMEDIATE MEDICAL CARE IF:   There is increasing redness,   swelling, or pain in or around a wound.  There is a red line that goes up your leg.  Pus is coming from a wound.  You develop a fever or as directed by your health care provider.  You notice a bad smell coming from an ulcer or wound. Document Released: 11/01/2000 Document Revised: 07/07/2013 Document Reviewed: 04/13/2013 ExitCare Patient Information 2015 [employer]. This information is not intended to replace advice given to you by your health care provider. Make sure you discuss any questions you have with your health care provider.  

## 2014-06-01 ENCOUNTER — Ambulatory Visit (AMBULATORY_SURGERY_CENTER): Payer: Self-pay | Admitting: *Deleted

## 2014-06-01 VITALS — Ht 65.0 in | Wt 188.0 lb

## 2014-06-01 DIAGNOSIS — Z86010 Personal history of colonic polyps: Secondary | ICD-10-CM

## 2014-06-01 MED ORDER — MOVIPREP 100 G PO SOLR: ORAL | Status: DC

## 2014-06-01 NOTE — Progress Notes (Signed)
Patient denies any allergies to eggs or soy. Patient denies any problems with anesthesia/sedation. Patient denies any oxygen use at home and does not take any diet/weight loss medications. EMMI education assisgned to patient on colonoscopy, this was explained and instructions given to patient. 

## 2014-06-13 ENCOUNTER — Encounter: Payer: Self-pay | Admitting: Internal Medicine

## 2014-06-15 ENCOUNTER — Encounter: Payer: Self-pay | Admitting: Internal Medicine

## 2014-06-15 ENCOUNTER — Ambulatory Visit (AMBULATORY_SURGERY_CENTER): Payer: Medicare Other | Admitting: Internal Medicine

## 2014-06-15 VITALS — BP 104/49 | HR 45 | Temp 96.8°F | Resp 24 | Ht 65.0 in | Wt 188.0 lb

## 2014-06-15 DIAGNOSIS — Z86010 Personal history of colonic polyps: Secondary | ICD-10-CM

## 2014-06-15 LAB — GLUCOSE, CAPILLARY
GLUCOSE-CAPILLARY: 219 mg/dL — AB (ref 70–99)
Glucose-Capillary: 183 mg/dL — ABNORMAL HIGH (ref 70–99)

## 2014-06-15 MED ORDER — SODIUM CHLORIDE 0.9 % IV SOLN: 500.0000 mL | INTRAVENOUS | Status: DC

## 2014-06-15 NOTE — Patient Instructions (Signed)
YOU HAD AN ENDOSCOPIC PROCEDURE TODAY AT THE [facility]: Refer to the procedure report that was given to you for any specific questions about what was found during the examination.  If the procedure report does not answer your questions, please call your gastroenterologist to clarify.  If you requested that your care partner not be given the details of your procedure findings, then the procedure report has been included in a sealed envelope for you to review at your convenience later.  YOU SHOULD EXPECT: Some feelings of bloating in the abdomen. Passage of more gas than usual.  Walking can help get rid of the air that was put into your GI tract during the procedure and reduce the bloating. If you had a lower endoscopy (such as a colonoscopy or flexible sigmoidoscopy) you may notice spotting of blood in your stool or on the toilet paper. If you underwent a bowel prep for your procedure, then you may not have a normal bowel movement for a few days.  DIET: Your first meal following the procedure should be a light meal and then it is ok to progress to your normal diet.  A half-sandwich or bowl of soup is an example of a good first meal.  Heavy or fried foods are harder to digest and may make you feel nauseous or bloated.  Likewise meals heavy in dairy and vegetables can cause extra gas to form and this can also increase the bloating.  Drink plenty of fluids but you should avoid alcoholic beverages for 24 hours.  ACTIVITY: Your care partner should take you home directly after the procedure.  You should plan to take it easy, moving slowly for the rest of the day.  You can resume normal activity the day after the procedure however you should NOT DRIVE or use heavy machinery for 24 hours (because of the sedation medicines used during the test).    SYMPTOMS TO REPORT IMMEDIATELY: A gastroenterologist can be reached at any hour.  During normal business hours, 8:30 AM to 5:00 PM Monday through Friday,  call [phone].  After hours and on weekends, please call the GI answering service at [phone] who will take a message and have the physician on call contact you.   Following lower endoscopy (colonoscopy or flexible sigmoidoscopy):  Excessive amounts of blood in the stool  Significant tenderness or worsening of abdominal pains  Swelling of the abdomen that is new, acute  Fever of 100F or higher   FOLLOW UP: If any biopsies were taken you will be contacted by phone or by letter within the next 1-3 weeks.  Call your gastroenterologist if you have not heard about the biopsies in 3 weeks.  Our staff will call the home number listed on your records the next business day following your procedure to check on you and address any questions or concerns that you may have at that time regarding the information given to you following your procedure. This is a courtesy call and so if there is no answer at the home number and we have not heard from you through the emergency physician on call, we will assume that you have returned to your regular daily activities without incident.  SIGNATURES/CONFIDENTIALITY: You and/or your care partner have signed paperwork which will be entered into your electronic medical record.  These signatures attest to the fact that that the information above on your After Visit Summary has been reviewed and is understood.  Full responsibility of the confidentiality of   this discharge information lies with you and/or your care-partner.    Your blood sugar was 219 in the recovery room. You may resume your current medications today. Please call if any questions or concerns.

## 2014-06-15 NOTE — Op Note (Signed)
[facility]  [address] [place], [id number]   COLONOSCOPY PROCEDURE REPORT  PATIENT: Teresa, Barnes.  MR#: [id number] BIRTHDATE: Sep 21, 1946 , 67  yrs. old GENDER: Female ENDOSCOPIST: Eustace Quail, MD REFERRED [id number] Program Recall PROCEDURE DATE:  06/15/2014 PROCEDURE:   Colonoscopy, surveillance First Screening Colonoscopy - Avg.  risk and is 50 yrs.  old or older - No.  Prior Negative Screening - Now for repeat screening. N/A  History of Adenoma - Now for follow-up colonoscopy & has been > or = to 3 yrs.  Yes hx of adenoma.  Has been 3 or more years since last colonoscopy.  Polyps Removed Today? No.  Recommend repeat exam, <10 yrs? No. ASA CLASS:   Class II INDICATIONS:Patient's personal history of adenomatous colon polyps. Index 03-2009 (small TA) MEDICATIONS: MAC sedation, administered by CRNA and propofol (Diprivan) 170mg  IV  DESCRIPTION OF PROCEDURE:   After the risks benefits and alternatives of the procedure were thoroughly explained, informed consent was obtained.  A digital rectal exam revealed no abnormalities of the rectum.   The [id number]  endoscope was introduced through the anus and advanced to the cecum, which was identified by both the appendix and ileocecal valve. No adverse events experienced.   The quality of the prep was good, using MoviPrep  The instrument was then slowly withdrawn as the colon was fully examined.      COLON FINDINGS: A normal appearing cecum, ileocecal valve, and appendiceal orifice were identified.  The ascending, hepatic flexure, transverse, splenic flexure, descending, sigmoid colon and rectum appeared unremarkable.  No polyps or cancers were seen. Retroflexed views revealed no abnormalities. The time to cecum=4 minutes 41 seconds.  Withdrawal time=10 minutes 15 seconds.  The scope was withdrawn and the procedure completed.  COMPLICATIONS: There were no complications.  ENDOSCOPIC  IMPRESSION: 1. Normal colon  RECOMMENDATIONS: 1. Continue current colorectal surveillance recommendations  with a repeat colonoscopy in 10 years.   eSigned:  Eustace Quail, MD 06/15/2014 9:21 AM   cc: The Patient and Jeanann Lewandowsky, MD

## 2014-06-15 NOTE — Progress Notes (Signed)
A/ox3 pleased with MAC, report to Annette RN 

## 2014-06-15 NOTE — Progress Notes (Signed)
No complaints noted in the recovery room. Maw   

## 2014-06-16 ENCOUNTER — Telehealth: Payer: Self-pay | Admitting: *Deleted

## 2014-06-16 NOTE — Telephone Encounter (Signed)
  Follow up Call-  Call back number 06/15/2014  Post procedure Call Back phone  # [phone]  Permission to leave phone message Yes     Patient questions:  Do you have a fever, pain , or abdominal swelling? No. Pain Score  0 *  Have you tolerated food without any problems? Yes.    Have you been able to return to your normal activities? Yes.    Do you have any questions about your discharge instructions: Diet   No. Medications  No. Follow up visit  No.  Do you have questions or concerns about your Care? No.  Actions: * If pain score is 4 or above: No action needed, pain <4.

## 2014-06-22 ENCOUNTER — Ambulatory Visit (INDEPENDENT_AMBULATORY_CARE_PROVIDER_SITE_OTHER): Payer: Medicare Other | Admitting: Ophthalmology

## 2014-06-22 DIAGNOSIS — H35039 Hypertensive retinopathy, unspecified eye: Secondary | ICD-10-CM

## 2014-06-22 DIAGNOSIS — H43819 Vitreous degeneration, unspecified eye: Secondary | ICD-10-CM

## 2014-06-22 DIAGNOSIS — E11359 Type 2 diabetes mellitus with proliferative diabetic retinopathy without macular edema: Secondary | ICD-10-CM

## 2014-06-22 DIAGNOSIS — E1165 Type 2 diabetes mellitus with hyperglycemia: Secondary | ICD-10-CM | POA: Diagnosis not present

## 2014-06-22 DIAGNOSIS — E1139 Type 2 diabetes mellitus with other diabetic ophthalmic complication: Secondary | ICD-10-CM | POA: Diagnosis not present

## 2014-06-22 DIAGNOSIS — H33009 Unspecified retinal detachment with retinal break, unspecified eye: Secondary | ICD-10-CM

## 2014-06-22 DIAGNOSIS — I10 Essential (primary) hypertension: Secondary | ICD-10-CM

## 2014-07-09 ENCOUNTER — Encounter: Payer: Self-pay | Admitting: Internal Medicine

## 2014-07-27 ENCOUNTER — Encounter: Payer: Self-pay | Admitting: Podiatry

## 2014-07-27 ENCOUNTER — Ambulatory Visit (INDEPENDENT_AMBULATORY_CARE_PROVIDER_SITE_OTHER): Payer: Medicare Other | Admitting: Podiatry

## 2014-07-27 DIAGNOSIS — M79609 Pain in unspecified limb: Secondary | ICD-10-CM

## 2014-07-27 DIAGNOSIS — M79673 Pain in unspecified foot: Secondary | ICD-10-CM

## 2014-07-27 DIAGNOSIS — B351 Tinea unguium: Secondary | ICD-10-CM

## 2014-07-27 NOTE — Patient Instructions (Signed)
Diabetes and Foot Care Diabetes may cause you to have problems because of poor blood supply (circulation) to your feet and legs. This may cause the skin on your feet to become thinner, break easier, and heal more slowly. Your skin may become dry, and the skin may peel and crack. You may also have nerve damage in your legs and feet causing decreased feeling in them. You may not notice minor injuries to your feet that could lead to infections or more serious problems. Taking care of your feet is one of the most important things you can do for yourself.  HOME CARE INSTRUCTIONS  Wear shoes at all times, even in the house. Do not go barefoot. Bare feet are easily injured.  Check your feet daily for blisters, cuts, and redness. If you cannot see the bottom of your feet, use a mirror or ask someone for help.  Wash your feet with warm water (do not use hot water) and mild soap. Then pat your feet and the areas between your toes until they are completely dry. Do not soak your feet as this can dry your skin.  Apply a moisturizing lotion or petroleum jelly (that does not contain alcohol and is unscented) to the skin on your feet and to dry, brittle toenails. Do not apply lotion between your toes.  Trim your toenails straight across. Do not dig under them or around the cuticle. File the edges of your nails with an emery board or nail file.  Do not cut corns or calluses or try to remove them with medicine.  Wear clean socks or stockings every day. Make sure they are not too tight. Do not wear knee-high stockings since they may decrease blood flow to your legs.  Wear shoes that fit properly and have enough cushioning. To break in new shoes, wear them for just a few hours a day. This prevents you from injuring your feet. Always look in your shoes before you put them on to be sure there are no objects inside.  Do not cross your legs. This may decrease the blood flow to your feet.  If you find a minor scrape,  cut, or break in the skin on your feet, keep it and the skin around it clean and dry. These areas may be cleansed with mild soap and water. Do not cleanse the area with peroxide, alcohol, or iodine.  When you remove an adhesive bandage, be sure not to damage the skin around it.  If you have a wound, look at it several times a day to make sure it is healing.  Do not use heating pads or hot water bottles. They may burn your skin. If you have lost feeling in your feet or legs, you may not know it is happening until it is too late.  Make sure your health care provider performs a complete foot exam at least annually or more often if you have foot problems. Report any cuts, sores, or bruises to your health care provider immediately. SEEK MEDICAL CARE IF:   You have an injury that is not healing.  You have cuts or breaks in the skin.  You have an ingrown nail.  You notice redness on your legs or feet.  You feel burning or tingling in your legs or feet.  You have pain or cramps in your legs and feet.  Your legs or feet are numb.  Your feet always feel cold. SEEK IMMEDIATE MEDICAL CARE IF:   There is increasing redness,   swelling, or pain in or around a wound.  There is a red line that goes up your leg.  Pus is coming from a wound.  You develop a fever or as directed by your health care provider.  You notice a bad smell coming from an ulcer or wound. Document Released: 11/01/2000 Document Revised: 07/07/2013 Document Reviewed: 04/13/2013 ExitCare Patient Information 2015 [employer]. This information is not intended to replace advice given to you by your health care provider. Make sure you discuss any questions you have with your health care provider.  

## 2014-07-28 NOTE — Progress Notes (Signed)
Subjective:     Patient ID: Teresa Barnes, female   DOB: 02-Sep-1946, 68 y.o.   MRN: [id number]  HPI patient's found to have thick nailbeds 1-5 both feet that are hard for her to cut and she has tried to take care of them herself   Review of Systems     Objective:   Physical Exam Neurovascular intact with thick yellow brittle nailbeds 1-5 both feet that are painful when pressed    Assessment:     Mycotic nail infection with pain 1-5 both feet    Plan:     Debride painful nailbeds 1-5 both feet with no iatrogenic bleeding noted

## 2014-08-15 ENCOUNTER — Ambulatory Visit: Payer: Medicare Other | Admitting: Podiatry

## 2014-10-03 ENCOUNTER — Other Ambulatory Visit: Payer: Self-pay

## 2014-10-03 DIAGNOSIS — Z1231 Encounter for screening mammogram for malignant neoplasm of breast: Secondary | ICD-10-CM

## 2014-10-26 ENCOUNTER — Ambulatory Visit: Payer: Medicare Other | Admitting: Podiatry

## 2014-10-31 ENCOUNTER — Ambulatory Visit
Admission: RE | Admit: 2014-10-31 | Discharge: 2014-10-31 | Disposition: A | Discharge status: Home / Self Care | Payer: Medicare Other | Source: Ambulatory Visit

## 2014-10-31 DIAGNOSIS — Z1231 Encounter for screening mammogram for malignant neoplasm of breast: Secondary | ICD-10-CM

## 2014-11-07 ENCOUNTER — Ambulatory Visit (INDEPENDENT_AMBULATORY_CARE_PROVIDER_SITE_OTHER): Payer: Medicare Other | Admitting: Podiatry

## 2014-11-07 ENCOUNTER — Encounter: Payer: Self-pay | Admitting: Podiatry

## 2014-11-07 DIAGNOSIS — B351 Tinea unguium: Secondary | ICD-10-CM

## 2014-11-07 DIAGNOSIS — M79673 Pain in unspecified foot: Secondary | ICD-10-CM

## 2014-11-07 NOTE — Progress Notes (Signed)
Subjective:     Patient ID: Teresa Barnes, female   DOB: Sep 12, 1946, 68 y.o.   MRN: [id number]  HPI patient's found to have thick nailbeds 1-5 both feet that are hard for her to cut and she has tried to take care of them herself   Review of Systems     Objective:   Physical Exam Neurovascular intact with thick yellow brittle nailbeds 1-5 both feet that are painful when pressed    Assessment:     Mycotic nail infection with pain 1-5 both feet    Plan:     Debride painful nailbeds 1-5 both feet with no iatrogenic bleeding noted

## 2014-11-07 NOTE — Patient Instructions (Signed)
Diabetes and Foot Care Diabetes may cause you to have problems because of poor blood supply (circulation) to your feet and legs. This may cause the skin on your feet to become thinner, break easier, and heal more slowly. Your skin may become dry, and the skin may peel and crack. You may also have nerve damage in your legs and feet causing decreased feeling in them. You may not notice minor injuries to your feet that could lead to infections or more serious problems. Taking care of your feet is one of the most important things you can do for yourself.  HOME CARE INSTRUCTIONS  Wear shoes at all times, even in the house. Do not go barefoot. Bare feet are easily injured.  Check your feet daily for blisters, cuts, and redness. If you cannot see the bottom of your feet, use a mirror or ask someone for help.  Wash your feet with warm water (do not use hot water) and mild soap. Then pat your feet and the areas between your toes until they are completely dry. Do not soak your feet as this can dry your skin.  Apply a moisturizing lotion or petroleum jelly (that does not contain alcohol and is unscented) to the skin on your feet and to dry, brittle toenails. Do not apply lotion between your toes.  Trim your toenails straight across. Do not dig under them or around the cuticle. File the edges of your nails with an emery board or nail file.  Do not cut corns or calluses or try to remove them with medicine.  Wear clean socks or stockings every day. Make sure they are not too tight. Do not wear knee-high stockings since they may decrease blood flow to your legs.  Wear shoes that fit properly and have enough cushioning. To break in new shoes, wear them for just a few hours a day. This prevents you from injuring your feet. Always look in your shoes before you put them on to be sure there are no objects inside.  Do not cross your legs. This may decrease the blood flow to your feet.  If you find a minor scrape,  cut, or break in the skin on your feet, keep it and the skin around it clean and dry. These areas may be cleansed with mild soap and water. Do not cleanse the area with peroxide, alcohol, or iodine.  When you remove an adhesive bandage, be sure not to damage the skin around it.  If you have a wound, look at it several times a day to make sure it is healing.  Do not use heating pads or hot water bottles. They may burn your skin. If you have lost feeling in your feet or legs, you may not know it is happening until it is too late.  Make sure your health care provider performs a complete foot exam at least annually or more often if you have foot problems. Report any cuts, sores, or bruises to your health care provider immediately. SEEK MEDICAL CARE IF:   You have an injury that is not healing.  You have cuts or breaks in the skin.  You have an ingrown nail.  You notice redness on your legs or feet.  You feel burning or tingling in your legs or feet.  You have pain or cramps in your legs and feet.  Your legs or feet are numb.  Your feet always feel cold. SEEK IMMEDIATE MEDICAL CARE IF:   There is increasing redness,   swelling, or pain in or around a wound.  There is a red line that goes up your leg.  Pus is coming from a wound.  You develop a fever or as directed by your health care provider.  You notice a bad smell coming from an ulcer or wound. Document Released: 11/01/2000 Document Revised: 07/07/2013 Document Reviewed: 04/13/2013 ExitCare Patient Information 2015 [employer]. This information is not intended to replace advice given to you by your health care provider. Make sure you discuss any questions you have with your health care provider.  

## 2014-11-29 DIAGNOSIS — D649 Anemia, unspecified: Secondary | ICD-10-CM | POA: Diagnosis not present

## 2014-11-29 DIAGNOSIS — M150 Primary generalized (osteo)arthritis: Secondary | ICD-10-CM | POA: Diagnosis not present

## 2014-11-29 DIAGNOSIS — I10 Essential (primary) hypertension: Secondary | ICD-10-CM | POA: Diagnosis not present

## 2014-11-29 DIAGNOSIS — E119 Type 2 diabetes mellitus without complications: Secondary | ICD-10-CM | POA: Diagnosis not present

## 2014-12-14 DIAGNOSIS — I10 Essential (primary) hypertension: Secondary | ICD-10-CM | POA: Diagnosis not present

## 2014-12-14 DIAGNOSIS — D649 Anemia, unspecified: Secondary | ICD-10-CM | POA: Diagnosis not present

## 2014-12-14 DIAGNOSIS — M150 Primary generalized (osteo)arthritis: Secondary | ICD-10-CM | POA: Diagnosis not present

## 2014-12-14 DIAGNOSIS — E119 Type 2 diabetes mellitus without complications: Secondary | ICD-10-CM | POA: Diagnosis not present

## 2014-12-21 DIAGNOSIS — H5213 Myopia, bilateral: Secondary | ICD-10-CM | POA: Diagnosis not present

## 2014-12-21 DIAGNOSIS — E11349 Type 2 diabetes mellitus with severe nonproliferative diabetic retinopathy without macular edema: Secondary | ICD-10-CM | POA: Diagnosis not present

## 2014-12-21 DIAGNOSIS — H4011X1 Primary open-angle glaucoma, mild stage: Secondary | ICD-10-CM | POA: Diagnosis not present

## 2014-12-22 DIAGNOSIS — D649 Anemia, unspecified: Secondary | ICD-10-CM | POA: Diagnosis not present

## 2015-02-13 ENCOUNTER — Ambulatory Visit (INDEPENDENT_AMBULATORY_CARE_PROVIDER_SITE_OTHER): Payer: Medicare Other | Admitting: Podiatry

## 2015-02-13 DIAGNOSIS — M79673 Pain in unspecified foot: Secondary | ICD-10-CM

## 2015-02-13 DIAGNOSIS — B351 Tinea unguium: Secondary | ICD-10-CM

## 2015-02-13 NOTE — Patient Instructions (Signed)
Diabetes and Foot Care Diabetes may cause you to have problems because of poor blood supply (circulation) to your feet and legs. This may cause the skin on your feet to become thinner, break easier, and heal more slowly. Your skin may become dry, and the skin may peel and crack. You may also have nerve damage in your legs and feet causing decreased feeling in them. You may not notice minor injuries to your feet that could lead to infections or more serious problems. Taking care of your feet is one of the most important things you can do for yourself.  HOME CARE INSTRUCTIONS  Wear shoes at all times, even in the house. Do not go barefoot. Bare feet are easily injured.  Check your feet daily for blisters, cuts, and redness. If you cannot see the bottom of your feet, use a mirror or ask someone for help.  Wash your feet with warm water (do not use hot water) and mild soap. Then pat your feet and the areas between your toes until they are completely dry. Do not soak your feet as this can dry your skin.  Apply a moisturizing lotion or petroleum jelly (that does not contain alcohol and is unscented) to the skin on your feet and to dry, brittle toenails. Do not apply lotion between your toes.  Trim your toenails straight across. Do not dig under them or around the cuticle. File the edges of your nails with an emery board or nail file.  Do not cut corns or calluses or try to remove them with medicine.  Wear clean socks or stockings every day. Make sure they are not too tight. Do not wear knee-high stockings since they may decrease blood flow to your legs.  Wear shoes that fit properly and have enough cushioning. To break in new shoes, wear them for just a few hours a day. This prevents you from injuring your feet. Always look in your shoes before you put them on to be sure there are no objects inside.  Do not cross your legs. This may decrease the blood flow to your feet.  If you find a minor scrape,  cut, or break in the skin on your feet, keep it and the skin around it clean and dry. These areas may be cleansed with mild soap and water. Do not cleanse the area with peroxide, alcohol, or iodine.  When you remove an adhesive bandage, be sure not to damage the skin around it.  If you have a wound, look at it several times a day to make sure it is healing.  Do not use heating pads or hot water bottles. They may burn your skin. If you have lost feeling in your feet or legs, you may not know it is happening until it is too late.  Make sure your health care provider performs a complete foot exam at least annually or more often if you have foot problems. Report any cuts, sores, or bruises to your health care provider immediately. SEEK MEDICAL CARE IF:   You have an injury that is not healing.  You have cuts or breaks in the skin.  You have an ingrown nail.  You notice redness on your legs or feet.  You feel burning or tingling in your legs or feet.  You have pain or cramps in your legs and feet.  Your legs or feet are numb.  Your feet always feel cold. SEEK IMMEDIATE MEDICAL CARE IF:   There is increasing redness,   swelling, or pain in or around a wound.  There is a red line that goes up your leg.  Pus is coming from a wound.  You develop a fever or as directed by your health care provider.  You notice a bad smell coming from an ulcer or wound. Document Released: 11/01/2000 Document Revised: 07/07/2013 Document Reviewed: 04/13/2013 ExitCare Patient Information 2015 [employer]. This information is not intended to replace advice given to you by your health care provider. Make sure you discuss any questions you have with your health care provider.  

## 2015-02-13 NOTE — Progress Notes (Signed)
Subjective:     Patient ID: Teresa Barnes, female   DOB: [birth year], 69 y.o.   MRN: [id number]  HPI patient's found to have thick nailbeds 1-5 both feet that are hard for her to cut and she has tried to take care of them herself   Review of Systems     Objective:   Physical Exam Neurovascular intact with thick yellow brittle nailbeds 1-5 both feet that are painful when pressed    Assessment:     Mycotic nail infection with pain 1-5 both feet    Plan:     Debride painful nailbeds 1-5 both feet with no iatrogenic bleeding noted

## 2015-03-15 DIAGNOSIS — E119 Type 2 diabetes mellitus without complications: Secondary | ICD-10-CM | POA: Diagnosis not present

## 2015-03-15 DIAGNOSIS — M150 Primary generalized (osteo)arthritis: Secondary | ICD-10-CM | POA: Diagnosis not present

## 2015-03-15 DIAGNOSIS — I10 Essential (primary) hypertension: Secondary | ICD-10-CM | POA: Diagnosis not present

## 2015-03-15 DIAGNOSIS — D649 Anemia, unspecified: Secondary | ICD-10-CM | POA: Diagnosis not present

## 2015-03-20 DIAGNOSIS — M25511 Pain in right shoulder: Secondary | ICD-10-CM | POA: Diagnosis not present

## 2015-03-27 ENCOUNTER — Ambulatory Visit (INDEPENDENT_AMBULATORY_CARE_PROVIDER_SITE_OTHER): Payer: Medicare Other | Admitting: Ophthalmology

## 2015-03-27 DIAGNOSIS — I10 Essential (primary) hypertension: Secondary | ICD-10-CM | POA: Diagnosis not present

## 2015-03-27 DIAGNOSIS — H43813 Vitreous degeneration, bilateral: Secondary | ICD-10-CM | POA: Diagnosis not present

## 2015-03-27 DIAGNOSIS — E11319 Type 2 diabetes mellitus with unspecified diabetic retinopathy without macular edema: Secondary | ICD-10-CM | POA: Diagnosis not present

## 2015-03-27 DIAGNOSIS — H338 Other retinal detachments: Secondary | ICD-10-CM

## 2015-03-27 DIAGNOSIS — E11359 Type 2 diabetes mellitus with proliferative diabetic retinopathy without macular edema: Secondary | ICD-10-CM | POA: Diagnosis not present

## 2015-03-27 DIAGNOSIS — H35033 Hypertensive retinopathy, bilateral: Secondary | ICD-10-CM | POA: Diagnosis not present

## 2015-03-28 DIAGNOSIS — M25511 Pain in right shoulder: Secondary | ICD-10-CM | POA: Diagnosis not present

## 2015-04-21 DIAGNOSIS — G545 Neuralgic amyotrophy: Secondary | ICD-10-CM | POA: Diagnosis not present

## 2015-04-26 DIAGNOSIS — G545 Neuralgic amyotrophy: Secondary | ICD-10-CM | POA: Diagnosis not present

## 2015-04-26 DIAGNOSIS — M6281 Muscle weakness (generalized): Secondary | ICD-10-CM | POA: Diagnosis not present

## 2015-05-17 DIAGNOSIS — D649 Anemia, unspecified: Secondary | ICD-10-CM | POA: Diagnosis not present

## 2015-05-17 DIAGNOSIS — M150 Primary generalized (osteo)arthritis: Secondary | ICD-10-CM | POA: Diagnosis not present

## 2015-05-17 DIAGNOSIS — I10 Essential (primary) hypertension: Secondary | ICD-10-CM | POA: Diagnosis not present

## 2015-05-17 DIAGNOSIS — E119 Type 2 diabetes mellitus without complications: Secondary | ICD-10-CM | POA: Diagnosis not present

## 2015-05-17 DIAGNOSIS — E780 Pure hypercholesterolemia: Secondary | ICD-10-CM | POA: Diagnosis not present

## 2015-06-12 ENCOUNTER — Ambulatory Visit (INDEPENDENT_AMBULATORY_CARE_PROVIDER_SITE_OTHER): Payer: Medicare Other | Admitting: Podiatry

## 2015-06-12 DIAGNOSIS — M79673 Pain in unspecified foot: Secondary | ICD-10-CM | POA: Diagnosis not present

## 2015-06-12 DIAGNOSIS — B351 Tinea unguium: Secondary | ICD-10-CM | POA: Diagnosis not present

## 2015-06-12 NOTE — Progress Notes (Signed)
Subjective: 69 y.o. returns the office today for painful, elongated, thickened toenails which she is unable to trim herself. Denies any redness or drainage around the nails. Denies any acute changes since last appointment and no new complaints today. Denies any systemic complaints such as fevers, chills, nausea, vomiting.   Objective: [name] 3, NAD DP/PT pulses palpable, CRT less than 3 seconds Nails hypertrophic, dystrophic, elongated, brittle, discolored 10. There is tenderness overlying the nails 1-5 bilaterally. There is no surrounding erythema or drainage along the nail sites. No open lesions or pre-ulcerative lesions are identified. No other areas of tenderness bilateral lower extremities. No overlying edema, erythema, increased warmth. No pain with calf compression, swelling, warmth, erythema.  Assessment: Patient presents with symptomatic onychomycosis  Plan: -Treatment options including alternatives, risks, complications were discussed -Nails sharply debrided 10 without complication/bleeding. -Discussed daily foot inspection. If there are any changes, to call the office immediately.  -Follow-up in 3 months or sooner if any problems are to arise. In the meantime, encouraged to call the office with any questions, concerns, changes symptoms.   Celesta Gentile, DPM

## 2015-08-15 DIAGNOSIS — H40003 Preglaucoma, unspecified, bilateral: Secondary | ICD-10-CM | POA: Diagnosis not present

## 2015-08-15 DIAGNOSIS — E08359 Diabetes mellitus due to underlying condition with proliferative diabetic retinopathy without macular edema: Secondary | ICD-10-CM | POA: Diagnosis not present

## 2015-09-19 ENCOUNTER — Encounter: Payer: Self-pay | Admitting: Podiatry

## 2015-09-19 ENCOUNTER — Ambulatory Visit (INDEPENDENT_AMBULATORY_CARE_PROVIDER_SITE_OTHER): Payer: Medicare Other | Admitting: Podiatry

## 2015-09-19 DIAGNOSIS — M79673 Pain in unspecified foot: Secondary | ICD-10-CM

## 2015-09-19 DIAGNOSIS — B351 Tinea unguium: Secondary | ICD-10-CM | POA: Diagnosis not present

## 2015-09-19 NOTE — Progress Notes (Signed)
Patient ID: [name], female   DOB: 07-04-1946, 69 y.o.   MRN: [id number] Complaint:  Visit Type: Patient returns to my office for continued preventative foot care services. Complaint: Patient states" my nails have grown long and thick and become painful to walk and wear shoes" Patient has been diagnosed with DM with no foot complications. The patient presents for preventative foot care services. No changes to ROS  Podiatric Exam: Vascular: dorsalis pedis and posterior tibial pulses are palpable bilateral. Capillary return is immediate. Temperature gradient is WNL. Skin turgor WNL  Sensorium: Normal Semmes Weinstein monofilament test. Normal tactile sensation bilaterally. Nail Exam: Pt has thick disfigured discolored nails with subungual debris noted bilateral entire nail hallux through fifth toenails Ulcer Exam: There is no evidence of ulcer or pre-ulcerative changes or infection. Orthopedic Exam: Muscle tone and strength are WNL. No limitations in general ROM. No crepitus or effusions noted. Foot type and digits show no abnormalities. Bony prominences are unremarkable. Skin: No Porokeratosis. No infection or ulcers  Diagnosis:  Onychomycosis, , Pain in right toe, pain in left toes  Treatment & Plan Procedures and Treatment: Consent by patient was obtained for treatment procedures. The patient understood the discussion of treatment and procedures well. All questions were answered thoroughly reviewed. Debridement of mycotic and hypertrophic toenails, 1 through 5 bilateral and clearing of subungual debris. No ulceration, no infection noted.  Return Visit-Office Procedure: Patient instructed to return to the office for a follow up visit 3 months for continued evaluation and treatment.

## 2015-10-04 DIAGNOSIS — M150 Primary generalized (osteo)arthritis: Secondary | ICD-10-CM | POA: Diagnosis not present

## 2015-10-04 DIAGNOSIS — I10 Essential (primary) hypertension: Secondary | ICD-10-CM | POA: Diagnosis not present

## 2015-10-04 DIAGNOSIS — S80211A Abrasion, right knee, initial encounter: Secondary | ICD-10-CM | POA: Diagnosis not present

## 2015-10-04 DIAGNOSIS — D649 Anemia, unspecified: Secondary | ICD-10-CM | POA: Diagnosis not present

## 2015-10-04 DIAGNOSIS — E119 Type 2 diabetes mellitus without complications: Secondary | ICD-10-CM | POA: Diagnosis not present

## 2015-10-09 DIAGNOSIS — H40003 Preglaucoma, unspecified, bilateral: Secondary | ICD-10-CM | POA: Diagnosis not present

## 2015-10-10 ENCOUNTER — Other Ambulatory Visit: Payer: Self-pay

## 2015-10-10 DIAGNOSIS — Z1231 Encounter for screening mammogram for malignant neoplasm of breast: Secondary | ICD-10-CM

## 2015-10-30 DIAGNOSIS — E119 Type 2 diabetes mellitus without complications: Secondary | ICD-10-CM | POA: Diagnosis not present

## 2015-10-30 DIAGNOSIS — M150 Primary generalized (osteo)arthritis: Secondary | ICD-10-CM | POA: Diagnosis not present

## 2015-10-30 DIAGNOSIS — I10 Essential (primary) hypertension: Secondary | ICD-10-CM | POA: Diagnosis not present

## 2015-10-30 DIAGNOSIS — D649 Anemia, unspecified: Secondary | ICD-10-CM | POA: Diagnosis not present

## 2015-11-24 ENCOUNTER — Other Ambulatory Visit (INDEPENDENT_AMBULATORY_CARE_PROVIDER_SITE_OTHER): Payer: Medicare Other

## 2015-11-24 ENCOUNTER — Encounter: Payer: Self-pay | Admitting: Family Medicine

## 2015-11-24 ENCOUNTER — Ambulatory Visit (INDEPENDENT_AMBULATORY_CARE_PROVIDER_SITE_OTHER)
Admission: RE | Admit: 2015-11-24 | Discharge: 2015-11-24 | Disposition: A | Discharge status: Home / Self Care | Payer: Medicare Other | Source: Ambulatory Visit | Attending: Family Medicine | Admitting: Family Medicine

## 2015-11-24 ENCOUNTER — Ambulatory Visit (INDEPENDENT_AMBULATORY_CARE_PROVIDER_SITE_OTHER): Payer: Medicare Other | Admitting: Family Medicine

## 2015-11-24 VITALS — BP 122/64 | HR 66 | Ht 64.5 in | Wt 181.0 lb

## 2015-11-24 DIAGNOSIS — M25511 Pain in right shoulder: Secondary | ICD-10-CM

## 2015-11-24 DIAGNOSIS — M75101 Unspecified rotator cuff tear or rupture of right shoulder, not specified as traumatic: Secondary | ICD-10-CM | POA: Diagnosis not present

## 2015-11-24 DIAGNOSIS — M629 Disorder of muscle, unspecified: Secondary | ICD-10-CM | POA: Diagnosis not present

## 2015-11-24 DIAGNOSIS — M6289 Other specified disorders of muscle: Secondary | ICD-10-CM | POA: Insufficient documentation

## 2015-11-24 HISTORY — DX: Unspecified rotator cuff tear or rupture of right shoulder, not specified as traumatic: M75.101

## 2015-11-24 NOTE — Progress Notes (Signed)
Pre visit review using our clinic review tool, if applicable. No additional management support is needed unless otherwise documented below in the visit note. 

## 2015-11-24 NOTE — Progress Notes (Signed)
[name] [address] [place], [place] [address] Phone: [phone] Subjective:    I'm seeing this patient by the request  of:  Foye Spurling, MD  CC: shoulder and leg issues  [id number] Teresa Barnes is a 70 y.o. female coming in with complaint of  Right shoulder and right leg pain.  Regarding patient's right shoulder she is had this pain for multiple months. Saw another provider who did have x-rays and an MRI. He told her that there was no rotator cuff tear and she should go to formal physical therapy. When one time and unfortunately was too painful so she never went back. Patient states since then she is noticing increasing weakness and decreasing range of motion. Seems to be getting worse. Has tried over-the-counter medications with minimal benefit. States that the pain wakes her up at night. Rates the severity of pain is 8 out of 10. Becoming unmanageable at this time she states. Affecting daily activities including dressing and washing herself and shower. Past medical history is significant for breast cancer.    Past Medical History  Diagnosis Date  . Diabetes mellitus without complication ([place])   . Hypertension   . Wears glasses   . Glaucoma   . Cancer [facility])     breast   Past Surgical History  Procedure Laterality Date  . Tubal ligation    . Colonoscopy    . Eye surgery  2011    lt eye blled  . Trigger finger release Right 06/24/2013    Procedure: RELEASE TRIGGER FINGER/A-1 PULLEY PIP RIGHT LONG FINGER;  Surgeon: Cammie Sickle., MD;  Location: [facility];  Service: Orthopedics;  Laterality: Right;  . Mastectomy Right 21 years ago   Social History   Social History  . Marital Status: Married    Spouse Name: N/A  . Number of Children: N/A  . Years of Education: N/A   Social History Main Topics  . Smoking status: Former Smoker    Quit date: 06/18/1993  . Smokeless tobacco: Never Used  . Alcohol Use: No  .  Drug Use: No  . Sexual Activity: Not Asked   Other Topics Concern  . None   Social History Narrative   No Known Allergies Family History  Problem Relation Age of Onset  . Colon cancer Neg Hx   . Esophageal cancer Neg Hx   . Stomach cancer Neg Hx   . Rectal cancer Neg Hx     Past medical history, social, surgical and family history all reviewed in electronic medical record.  No pertanent information unless stated regarding to the chief complaint.   Review of Systems: No headache, visual changes, nausea, vomiting, diarrhea, constipation, dizziness, abdominal pain, skin rash, fevers, chills, night sweats, weight loss, swollen lymph nodes, body aches, joint swelling, muscle aches, chest pain, shortness of breath, mood changes.   Objective Blood pressure 122/64, pulse 66, height 5' 4.5" (1.638 m), weight 181 lb (82.101 kg), SpO2 99 %.  General: No apparent distress alert and oriented x3 mood and affect normal, dressed appropriately.  HEENT: Pupils equal, extraocular movements intact  Respiratory: Patient's speak in full sentences and does not appear short of breath  Cardiovascular: No lower extremity edema, non tender, no erythema  Skin: Warm dry intact with no signs of infection or rash on extremities or on axial skeleton.  Abdomen: Soft nontender  Neuro: Cranial nerves II through XII are intact, neurovascularly intact in all extremities with 2+  DTRs and 2+ pulses.  Lymph: No lymphadenopathy of posterior or anterior cervical chain or axillae bilaterally.  Gait normal with good balance and coordination.  MSK:  Non tender with full range of motion and good stability and symmetric strength and tone of elbows, wrist, hip, and ankles bilaterally.  Shoulder: Right Inspection reveals no abnormalities, atrophy or asymmetry. Palpation is normal with no tenderness over AC joint or bicipital groove. Does have limitation in range of motion lacking 5 of external rotation, internal rotation to  sacrum, and forward flexion passively to 140 and actively to 90 Rotator cuff strength 3+ out of 5.full strength contralateral side signs of impingement with positive Neer and Hawkin's tests, but negative empty can sign. Speeds and Yergason's tests normal. No labral pathology noted with negative Obrien's, negative clunk and good stability. Normal scapular function observed. Positive painful arc No apprehension sign Contralateral shoulder unremarkable.  Right knee shows the patient does have some tightness to the hamstring. Nontender on exam today. Full range of motion. Neurovascular intact intact distally. Deep tendon reflexes are 2+ and symmetric. Negative McMurray sign and all ligaments are intact.  MSK US performed of: Right This study was ordered, performed, and interpreted by Charlann Boxer D.O.  Shoulder:   Supraspinatus: patient has an articular side tear that seems to be near full thickness. Hypoechoic changes noted. No significant retraction noted.. Infraspinatus:  Full-thickness tear noted with atrophy. Retraction noted. Subscapularis:  Degenerative changes but no specific tear Teres Minor:  Appears normal on long and transverse views. AC joint:  Mild arthritic changes Glenohumeral Joint:  Amild to moderate arthritic changesn. Glenoid Labrum:  Indegenerative changes but no tear Biceps Tendon:  Appears normal on long and transverse views, no fraying of tendon, tendon located in intertubercular groove, no subluxation with shoulder internal or external rotation.  Impression: near full-thickness tear of the supraspinatus and full-thickness tear with mild retraction of the infraspinatus.  Procedure: Real-time Ultrasound Guided Injection of right glenohumeral joint Device: GE Logiq E  Ultrasound guided injection is preferred based studies that show increased duration, increased effect, greater accuracy, decreased procedural pain, increased response rate with ultrasound guided versus  blind injection.  Verbal informed consent obtained.  Time-out conducted.  Noted no overlying erythema, induration, or other signs of local infection.  Skin prepped in a sterile fashion.  Local anesthesia: Topical Ethyl chloride.  With sterile technique and under real time ultrasound guidance:  Joint visualized.  23g 1  inch needle inserted posterior approach. Pictures taken for needle placement. Patient did have injection of 2 cc of 1% lidocaine, 2 cc of 0.5% Marcaine, and 1.0 cc of Kenalog 40 mg/dL. Completed without difficulty  Pain immediately resolved suggesting accurate placement of the medication.  Advised to call if fevers/chills, erythema, induration, drainage, or persistent bleeding.  Images permanently stored and available for review in the ultrasound unit.  Impression: Technically successful ultrasound guided injection.  Procedure note [id number]; 15 minutes spent for Therapeutic exercises as stated in above notes.  This included exercises focusing on stretching, strengthening, with significant focus on eccentric aspects.  Basic scapular stabilization to include adduction and depression of scapula Scaption, focusing on proper movement and good control Internal and External rotation utilizing a theraband, with elbow tucked at side entire time Rows with theraban Proper technique shown and discussed handout in great detail with ATC.  All questions were discussed and answered.      Impression and Recommendations:     This case required medical decision making of moderate  complexity.      Note: This dictation was prepared with Dragon dictation along with smaller phrase technology. Any transcriptional errors that result from this process are unintentional.

## 2015-11-24 NOTE — Patient Instructions (Signed)
Good to see you.  Ice 20 minutes 2 times daily. Usually after activity and before bed. Exercises 3 times a week.  pennsaid pinkie amount topically 2 times daily as needed.  For the leg I think it is the hamstrings Try a compression sleeve for the thigh (can get at [facility] or [facility]) Try wearing good shoes Xray downstairs today.  See me again in 3 weeks

## 2015-11-24 NOTE — Assessment & Plan Note (Signed)
Patient does have a rotator cuff tear that seems to be noted today. This is different than the reported she gives me on her previous MRI.I do not have the MRI readily available at this time. We will get x-rays. My concern is she would have significant amount of arthritis that would keep her from having rotator cuff repair is a possibility. Patient elected try an injection today with good resolution of the pain but continued to have some limitation in range of motion especially the strength. Discussed with patient will try 3 weeks at home exercises, icing, topical anti-inflammatories, and patient will come back at that time and we'll discuss further. Possible need for repeat of an MRI or possibly send for surgical opinion if no significant improvement or worsening symptoms occur. Patient does respond to conservative therapy we can consider formal physical therapy.

## 2015-11-24 NOTE — Assessment & Plan Note (Signed)
Patient has some tightness. Negative straight leg and no back pain associated with it. Patient knee is unremarkable for any type of pain. Discussed with patient about different given options and patient has elected try compression, home exercises and icing. If no significant improvement we'll consider ultrasound and further workup.

## 2015-11-29 ENCOUNTER — Ambulatory Visit
Admission: RE | Admit: 2015-11-29 | Discharge: 2015-11-29 | Disposition: A | Discharge status: Home / Self Care | Payer: Medicare Other | Source: Ambulatory Visit

## 2015-11-29 DIAGNOSIS — Z1231 Encounter for screening mammogram for malignant neoplasm of breast: Secondary | ICD-10-CM

## 2015-12-15 ENCOUNTER — Ambulatory Visit (INDEPENDENT_AMBULATORY_CARE_PROVIDER_SITE_OTHER): Payer: Medicare Other | Admitting: Family Medicine

## 2015-12-15 ENCOUNTER — Encounter: Payer: Self-pay | Admitting: Family Medicine

## 2015-12-15 VITALS — BP 122/60 | HR 66 | Ht 64.5 in | Wt 178.0 lb

## 2015-12-15 DIAGNOSIS — M75101 Unspecified rotator cuff tear or rupture of right shoulder, not specified as traumatic: Secondary | ICD-10-CM

## 2015-12-15 MED ORDER — TRAMADOL HCL 50 MG PO TABS
50.0000 mg | ORAL_TABLET | Freq: Every evening | ORAL | Status: DC | PRN
Start: 2015-12-15 — End: 2016-07-30

## 2015-12-15 NOTE — Assessment & Plan Note (Signed)
Patient continues to have significant weakness as well as pain. Seems to be worsening over the course of time and has failed conservative therapy. Has tried formal physical therapy in the past but because of the pain and weakness she has not been able to participate well. Patient states that she did have an MRI greater than 6 months ago but this has changed drastically over the course of the last several months icing her. I think that she does have a full-thickness tear and I do think we need to rule out labral pathology. MR arthrogram is ordered at this time. Depending on findings we will see if patient is a surgical candidate and would get her to have this fixed. Patient's arthritis on x-ray only shows mild arthritis I do think patient could be a candidate for arthroscopic procedure.  Spent  25 minutes with patient face-to-face and had greater than 50% of counseling including as described above in assessment and plan.

## 2015-12-15 NOTE — Patient Instructions (Addendum)
I am sorry you are still hurting.  Ice can help MRI of the shoulder with dye is ordered Tramadol at night if needed Take tylenol 500mg  3 times daily See me again 1-2 days after MRI and we will discuss.

## 2015-12-15 NOTE — Progress Notes (Signed)
Pre visit review using our clinic review tool, if applicable. No additional management support is needed unless otherwise documented below in the visit note. 

## 2015-12-15 NOTE — Progress Notes (Signed)
[facility] [address] [place], [place] [address] Phone: [phone] Subjective:    I'm seeing this patient by the request  of:  Foye Spurling, MD  CC: Right shoulder follow up  [id number] [name] is a 70 y.o. female coming in with complaint of  Right shoulder pain. Patient was found to have mild arthritic changes but also had what appeared to be a full-thickness rotator cuff tear with mild retraction. Patient elected try conservative therapy. Patient was given an injection at last visit. Patient was exercises as well as icing protocol. Patient given trial of topical anti-inflammatories. Patient states the injection did not seem to help at all. States that the pain was minimally better. States that it seems to be worsening though over the course of time and has not had any improvement in strength. Patient once again did have workup previously by another physician and she states she did have an MRI of the shoulder. Date told her at that time the rotator cuff looked good. Patient's last ultrasound that was independently visualized by me again today showed the patient did have any full-thickness tear of the supraspinatus. Patient continues to have the pain is waking her up at night and affecting all daily activities. Not responding to the over-the-counter medications     Past Medical History  Diagnosis Date  . Diabetes mellitus without complication ([place])   . Hypertension   . Wears glasses   . Glaucoma   . Cancer [facility])     breast   Past Surgical History  Procedure Laterality Date  . Tubal ligation    . Colonoscopy    . Eye surgery  2011    lt eye blled  . Trigger finger release Right 06/24/2013    Procedure: RELEASE TRIGGER FINGER/A-1 PULLEY PIP RIGHT LONG FINGER;  Surgeon: Cammie Sickle., MD;  Location: [facility];  Service: Orthopedics;  Laterality: Right;  . Mastectomy Right 21 years ago   Social History   Social  History  . Marital Status: Married    Spouse Name: N/A  . Number of Children: N/A  . Years of Education: N/A   Social History Main Topics  . Smoking status: Former Smoker    Quit date: 06/18/1993  . Smokeless tobacco: Never Used  . Alcohol Use: No  . Drug Use: No  . Sexual Activity: Not Asked   Other Topics Concern  . None   Social History Narrative   No Known Allergies Family History  Problem Relation Age of Onset  . Colon cancer Neg Hx   . Esophageal cancer Neg Hx   . Stomach cancer Neg Hx   . Rectal cancer Neg Hx     Past medical history, social, surgical and family history all reviewed in electronic medical record.  No pertanent information unless stated regarding to the chief complaint.   Review of Systems: No headache, visual changes, nausea, vomiting, diarrhea, constipation, dizziness, abdominal pain, skin rash, fevers, chills, night sweats, weight loss, swollen lymph nodes, body aches, joint swelling, muscle aches, chest pain, shortness of breath, mood changes.   Objective Blood pressure 122/60, pulse 66, height 5' 4.5" (1.638 m), weight 178 lb (80.74 kg), SpO2 98 %.  General: No apparent distress alert and oriented x3 mood and affect normal, dressed appropriately.  HEENT: Pupils equal, extraocular movements intact  Respiratory: Patient's speak in full sentences and does not appear short of breath  Cardiovascular: No lower extremity edema, non tender,  no erythema  Skin: Warm dry intact with no signs of infection or rash on extremities or on axial skeleton.  Abdomen: Soft nontender  Neuro: Cranial nerves II through XII are intact, neurovascularly intact in all extremities with 2+ DTRs and 2+ pulses.  Lymph: No lymphadenopathy of posterior or anterior cervical chain or axillae bilaterally.  Gait normal with good balance and coordination.  MSK:  Non tender with full range of motion and good stability and symmetric strength and tone of elbows, wrist, hip, and ankles  bilaterally.  Shoulder: Right Inspection reveals no abnormalities, atrophy or asymmetry. Palpation is normal with no tenderness over AC joint or bicipital groove. Does have limitation in range of motion lacking 5 of external rotation, internal rotation to sacrum, and forward flexion passively to 130 and actively to 90 Rotator cuff strength 3+ out of 5.full strength contralateral side signs of impingement with positive Neer and Hawkin's tests, but negative empty can sign. Speeds and Yergason's tests normal. Positive O'Brien's and labral pathology Normal scapular function observed. Positive painful arc No apprehension sign Contralateral shoulder unremarkable. No change from previous exam and possibly some worsening          Impression and Recommendations:     This case required medical decision making of moderate complexity.

## 2015-12-26 ENCOUNTER — Ambulatory Visit: Payer: Medicare Other | Admitting: Podiatry

## 2015-12-28 ENCOUNTER — Ambulatory Visit
Admission: RE | Admit: 2015-12-28 | Discharge: 2015-12-28 | Disposition: A | Discharge status: Home / Self Care | Payer: Medicare Other | Source: Ambulatory Visit | Attending: Family Medicine | Admitting: Family Medicine

## 2015-12-28 DIAGNOSIS — M75101 Unspecified rotator cuff tear or rupture of right shoulder, not specified as traumatic: Secondary | ICD-10-CM

## 2015-12-28 DIAGNOSIS — M7531 Calcific tendinitis of right shoulder: Secondary | ICD-10-CM | POA: Diagnosis not present

## 2015-12-28 DIAGNOSIS — M25511 Pain in right shoulder: Secondary | ICD-10-CM | POA: Diagnosis not present

## 2015-12-28 MED ORDER — IOHEXOL 180 MG/ML  SOLN: 15.0000 mL | Freq: Once | INTRAMUSCULAR | Status: DC | PRN

## 2015-12-29 ENCOUNTER — Ambulatory Visit (INDEPENDENT_AMBULATORY_CARE_PROVIDER_SITE_OTHER): Payer: Medicare Other | Admitting: Ophthalmology

## 2015-12-29 DIAGNOSIS — H35033 Hypertensive retinopathy, bilateral: Secondary | ICD-10-CM

## 2015-12-29 DIAGNOSIS — E11319 Type 2 diabetes mellitus with unspecified diabetic retinopathy without macular edema: Secondary | ICD-10-CM | POA: Diagnosis not present

## 2015-12-29 DIAGNOSIS — I10 Essential (primary) hypertension: Secondary | ICD-10-CM

## 2015-12-29 DIAGNOSIS — H338 Other retinal detachments: Secondary | ICD-10-CM

## 2015-12-29 DIAGNOSIS — E113593 Type 2 diabetes mellitus with proliferative diabetic retinopathy without macular edema, bilateral: Secondary | ICD-10-CM

## 2016-01-01 ENCOUNTER — Telehealth: Payer: Self-pay | Admitting: Internal Medicine

## 2016-01-01 NOTE — Telephone Encounter (Signed)
lmovm for pt to return call.  

## 2016-01-01 NOTE — Telephone Encounter (Signed)
Pt called in and wants the results of mri but only appt this week is tomorrow at 8:30.  She can not come then.  She wants to know if he can work her in?

## 2016-01-01 NOTE — Telephone Encounter (Signed)
What about 845 on wed  Or otherwise double book anywhere there is 2 new patients in a row.

## 2016-01-02 ENCOUNTER — Ambulatory Visit (INDEPENDENT_AMBULATORY_CARE_PROVIDER_SITE_OTHER): Payer: Medicare Other | Admitting: Family Medicine

## 2016-01-02 ENCOUNTER — Encounter: Payer: Self-pay | Admitting: Family Medicine

## 2016-01-02 ENCOUNTER — Other Ambulatory Visit (INDEPENDENT_AMBULATORY_CARE_PROVIDER_SITE_OTHER): Payer: Medicare Other

## 2016-01-02 VITALS — BP 114/62 | HR 79 | Ht 64.5 in | Wt 178.0 lb

## 2016-01-02 DIAGNOSIS — M25511 Pain in right shoulder: Secondary | ICD-10-CM

## 2016-01-02 DIAGNOSIS — M129 Arthropathy, unspecified: Secondary | ICD-10-CM

## 2016-01-02 DIAGNOSIS — M19011 Primary osteoarthritis, right shoulder: Secondary | ICD-10-CM

## 2016-01-02 NOTE — Assessment & Plan Note (Signed)
Patient given injection today and tolerated the procedure well. We discussed icing regimen. We discussed home exercises. We discussed which activities to do an which was potentially avoid. We discussed continuing with the formal physical therapy. Patient will follow-up with me again in 4-6 weeks for further evaluation.  Spent  25 minutes with patient face-to-face and had greater than 50% of counseling including as described above in assessment and plan.

## 2016-01-02 NOTE — Patient Instructions (Signed)
Good to see you  Keep up with the icing Did injection in your type 3 acromion.  MRI showed arthritis of the [facility] joint and mild of shoulder.  Start the exercises again in 3 days and then do them 3 times a week.  See me again in 3 weeks.

## 2016-01-02 NOTE — Telephone Encounter (Signed)
Pt was seen today by dr smith.  

## 2016-01-02 NOTE — Progress Notes (Signed)
Pre visit review using our clinic review tool, if applicable. No additional management support is needed unless otherwise documented below in the visit note. 

## 2016-01-02 NOTE — Progress Notes (Signed)
[facility] [address], [place] [address] Phone: [phone] Subjective:    I'm seeing this patient by the request  of:  Foye Spurling, MD  CC: Right shoulder follow up  [id number] Teresa Barnes is a 70 y.o. female coming in with complaint of  Right shoulder pain. Patient was found to have mild arthritic changes but also had what appeared to be a full-thickness rotator cuff tear with mild retraction. Patient elected try conservative therapy. Patient was given an injection at last visit. Patient was exercises as well as icing protocol. Patient given trial of topical anti-inflammatories. Patient states the injection did not seem to help at all. States that the pain was minimally better. States that it seems to be worsening though over the course of time and has not had any improvement in strength. Patient once again did have workup previously by another physician and she states she did have an MRI of the shoulder. Date told her at that time the rotator cuff looked good. Patient did have a type III acromion. Mild arthritis and spurring noted. Otherwise unremarkable.     Past Medical History  Diagnosis Date  . Diabetes mellitus without complication ([place])   . Hypertension   . Wears glasses   . Glaucoma   . Cancer [facility])     breast   Past Surgical History  Procedure Laterality Date  . Tubal ligation    . Colonoscopy    . Eye surgery  2011    lt eye blled  . Trigger finger release Right 06/24/2013    Procedure: RELEASE TRIGGER FINGER/A-1 PULLEY PIP RIGHT LONG FINGER;  Surgeon: Cammie Sickle., MD;  Location: [facility];  Service: Orthopedics;  Laterality: Right;  . Mastectomy Right 21 years ago   Social History   Social History  . Marital Status: Married    Spouse Name: N/A  . Number of Children: N/A  . Years of Education: N/A   Social History Main Topics  . Smoking status: Former Smoker    Quit date: 06/18/1993  .  Smokeless tobacco: Never Used  . Alcohol Use: No  . Drug Use: No  . Sexual Activity: Not Asked   Other Topics Concern  . None   Social History Narrative   No Known Allergies Family History  Problem Relation Age of Onset  . Colon cancer Neg Hx   . Esophageal cancer Neg Hx   . Stomach cancer Neg Hx   . Rectal cancer Neg Hx     Past medical history, social, surgical and family history all reviewed in electronic medical record.  No pertanent information unless stated regarding to the chief complaint.   Review of Systems: No headache, visual changes, nausea, vomiting, diarrhea, constipation, dizziness, abdominal pain, skin rash, fevers, chills, night sweats, weight loss, swollen lymph nodes, body aches, joint swelling, muscle aches, chest pain, shortness of breath, mood changes.   Objective Blood pressure 114/62, pulse 79, height 5' 4.5" (1.638 m), weight 178 lb (80.74 kg), SpO2 98 %.  General: No apparent distress alert and oriented x3 mood and affect normal, dressed appropriately.  HEENT: Pupils equal, extraocular movements intact  Respiratory: Patient's speak in full sentences and does not appear short of breath  Cardiovascular: No lower extremity edema, non tender, no erythema  Skin: Warm dry intact with no signs of infection or rash on extremities or on axial skeleton.  Abdomen: Soft nontender  Neuro: Cranial nerves II through XII  are intact, neurovascularly intact in all extremities with 2+ DTRs and 2+ pulses.  Lymph: No lymphadenopathy of posterior or anterior cervical chain or axillae bilaterally.  Gait normal with good balance and coordination.  MSK:  Non tender with full range of motion and good stability and symmetric strength and tone of elbows, wrist, hip, and ankles bilaterally.  Shoulder: Right Inspection reveals no abnormalities, atrophy or asymmetry. Palpation is normal with no tenderness over AC joint or bicipital groove. Does have limitation in range of motion  lacking 5 of external rotation, internal rotation to sacrum, and forward flexion passively to 130 and actively to 90 Rotator cuff strength 4 out of 5.full strength contralateral side signs of impingement with positive Neer and Hawkin's tests, but negative empty can sign. Speeds and Yergason's tests normal. Negative O'Brien's and labral pathology Normal scapular function observed. Positive painful arc No apprehension sign Contralateral shoulder unremarkable. No change from previous exam and possibly some worsening   Procedure: Real-time Ultrasound Guided Injection of right acromioclavicular joint Device: GE Logiq E  Ultrasound guided injection is preferred based studies that show increased duration, increased effect, greater accuracy, decreased procedural pain, increased response rate, and decreased cost with ultrasound guided versus blind injection.  Verbal informed consent obtained.  Time-out conducted.  Noted no overlying erythema, induration, or other signs of local infection.  Skin prepped in a sterile fashion.  Local anesthesia: Topical Ethyl chloride.  With sterile technique and under real time ultrasound guidance:  With a 25-gauge 1 inch needle patient was injected with a total of 0.5 mL of 0.5% Marcaine and 0.5 mL of Kenalog 40 mg/dL this was done from a superior approach Completed without difficulty  Pain immediately resolved suggesting accurate placement of the medication.  Advised to call if fevers/chills, erythema, induration, drainage, or persistent bleeding.  Images permanently stored and available for review in the ultrasound unit.  Impression: Technically successful ultrasound guided injection.       Impression and Recommendations:     This case required medical decision making of moderate complexity.

## 2016-01-05 ENCOUNTER — Ambulatory Visit (INDEPENDENT_AMBULATORY_CARE_PROVIDER_SITE_OTHER): Payer: Medicare Other | Admitting: Podiatry

## 2016-01-05 ENCOUNTER — Encounter: Payer: Self-pay | Admitting: Podiatry

## 2016-01-05 DIAGNOSIS — M79673 Pain in unspecified foot: Secondary | ICD-10-CM | POA: Diagnosis not present

## 2016-01-05 DIAGNOSIS — B351 Tinea unguium: Secondary | ICD-10-CM

## 2016-01-05 NOTE — Progress Notes (Signed)
Patient ID: Teresa Barnes, female   DOB: 09-07-1946, 70 y.o.   MRN: [id number] Complaint:  Visit Type: Patient returns to my office for continued preventative foot care services. Complaint: Patient states" my nails have grown long and thick and become painful to walk and wear shoes" Patient has been diagnosed with DM with no foot complications. The patient presents for preventative foot care services. No changes to ROS  Podiatric Exam: Vascular: dorsalis pedis and posterior tibial pulses are palpable bilateral. Capillary return is immediate. Temperature gradient is WNL. Skin turgor WNL  Sensorium: Normal Semmes Weinstein monofilament test. Normal tactile sensation bilaterally. Nail Exam: Pt has thick disfigured discolored nails with subungual debris noted bilateral entire nail hallux through fifth toenails Ulcer Exam: There is no evidence of ulcer or pre-ulcerative changes or infection. Orthopedic Exam: Muscle tone and strength are WNL. No limitations in general ROM. No crepitus or effusions noted. Foot type and digits show no abnormalities. Bony prominences are unremarkable. Skin: No Porokeratosis. No infection or ulcers  Diagnosis:  Onychomycosis, , Pain in right toe, pain in left toes  Treatment & Plan Procedures and Treatment: Consent by patient was obtained for treatment procedures. The patient understood the discussion of treatment and procedures well. All questions were answered thoroughly reviewed. Debridement of mycotic and hypertrophic toenails, 1 through 5 bilateral and clearing of subungual debris. No ulceration, no infection noted.  Return Visit-Office Procedure: Patient instructed to return to the office for a follow up visit 3 months for continued evaluation and treatment.   Gardiner Barefoot DPM

## 2016-01-23 ENCOUNTER — Encounter: Payer: Self-pay | Admitting: Family Medicine

## 2016-01-23 ENCOUNTER — Ambulatory Visit (INDEPENDENT_AMBULATORY_CARE_PROVIDER_SITE_OTHER): Payer: Medicare Other | Admitting: Family Medicine

## 2016-01-23 VITALS — BP 126/66 | HR 87 | Wt 177.0 lb

## 2016-01-23 DIAGNOSIS — M19011 Primary osteoarthritis, right shoulder: Secondary | ICD-10-CM

## 2016-01-23 DIAGNOSIS — M75101 Unspecified rotator cuff tear or rupture of right shoulder, not specified as traumatic: Secondary | ICD-10-CM | POA: Diagnosis not present

## 2016-01-23 DIAGNOSIS — M5416 Radiculopathy, lumbar region: Secondary | ICD-10-CM | POA: Diagnosis not present

## 2016-01-23 DIAGNOSIS — M129 Arthropathy, unspecified: Secondary | ICD-10-CM

## 2016-01-23 MED ORDER — GABAPENTIN 100 MG PO CAPS: 100.0000 mg | ORAL_CAPSULE | Freq: Every day | ORAL | Status: DC

## 2016-01-23 NOTE — Progress Notes (Signed)
[name] [address], [place] [address] Phone: [phone] Subjective:    I'm seeing this patient by the request  of:  Foye Spurling, MD  CC: Right shoulder follow up  [id number] Teresa Barnes is a 70 y.o. female coming in with complaint of  Right shoulder pain. Patient was found to have mild arthritic changes but also had what appeared to be a full-thickness rotator cuff tear with mild retraction. Patient wanted acromial clavicular joint injection at last follow-up because that was where most of her pain was. States that this has improved. Continues to have some of the weakness. Still does not have any surgical intervention for the shoulder. But states that she can tolerate the pain at this time. Patient has taken her tramadol intermittently but not as severe as what it has been. States that if this is his good as it would get she would be happy with it.  Patient is also having some right leg discomfort. Denies any back pain seems to be associated with it but does have some more tightness than usual. States that after walking for some distance that she can have worsening symptoms on this right leg. Feels that it is heavy. Has weakness. Denies any chronic numbness. Patient rates the severity of discomfort as 5 out of 10. Patient is concerned cousin seems to be worsening.     Past Medical History  Diagnosis Date  . Diabetes mellitus without complication ([place])   . Hypertension   . Wears glasses   . Glaucoma   . Cancer [facility])     breast   Past Surgical History  Procedure Laterality Date  . Tubal ligation    . Colonoscopy    . Eye surgery  2011    lt eye blled  . Trigger finger release Right 06/24/2013    Procedure: RELEASE TRIGGER FINGER/A-1 PULLEY PIP RIGHT LONG FINGER;  Surgeon: Cammie Sickle., MD;  Location: [facility];  Service: Orthopedics;  Laterality: Right;  . Mastectomy Right 21 years ago   Social History    Social History  . Marital Status: Married    Spouse Name: N/A  . Number of Children: N/A  . Years of Education: N/A   Social History Main Topics  . Smoking status: Former Smoker    Quit date: 06/18/1993  . Smokeless tobacco: Never Used  . Alcohol Use: No  . Drug Use: No  . Sexual Activity: Not Asked   Other Topics Concern  . None   Social History Narrative   No Known Allergies Family History  Problem Relation Age of Onset  . Colon cancer Neg Hx   . Esophageal cancer Neg Hx   . Stomach cancer Neg Hx   . Rectal cancer Neg Hx     Past medical history, social, surgical and family history all reviewed in electronic medical record.  No pertanent information unless stated regarding to the chief complaint.   Review of Systems: No headache, visual changes, nausea, vomiting, diarrhea, constipation, dizziness, abdominal pain, skin rash, fevers, chills, night sweats, weight loss, swollen lymph nodes, body aches, joint swelling, muscle aches, chest pain, shortness of breath, mood changes.   Objective Blood pressure 126/66, pulse 87, weight 177 lb (80.287 kg), SpO2 95 %.  General: No apparent distress alert and oriented x3 mood and affect normal, dressed appropriately.  HEENT: Pupils equal, extraocular movements intact  Respiratory: Patient's speak in full sentences and does not appear short of  breath  Cardiovascular: No lower extremity edema, non tender, no erythema  Skin: Warm dry intact with no signs of infection or rash on extremities or on axial skeleton.  Abdomen: Soft nontender  Neuro: Cranial nerves II through XII are intact, neurovascularly intact in all extremities with 2+ DTRs and 2+ pulses.  Lymph: No lymphadenopathy of posterior or anterior cervical chain or axillae bilaterally.  Gait normal with good balance and coordination.  MSK:  Non tender with full range of motion and good stability and symmetric strength and tone of elbows, wrist, hip, and ankles bilaterally.   Shoulder: Right Inspection reveals no abnormalities, atrophy or asymmetry. Palpation is normal with no tenderness over AC joint or bicipital groove. Patient does have improved internal rotation to L5, for flexion of 100 mL degrees which is also improved Rotator cuff strength 4 out of 5.full strength contralateral side signs of impingement with positive Neer and Hawkin's tests, but negative empty can sign. Speeds and Yergason's tests normal. Negative O'Brien's and labral pathology Normal scapular function observed. Improved painful arc No apprehension sign Contralateral shoulder unremarkable. Improvement from previous exam   Back exam shows the patient may have a positive straight leg test on the right side. 4-5 strength of hip flexor compared to the contralateral side. Mild atrophy of the quadriceps compared to the contralateral side. Deep tendon reflexes are 2+ and symmetric to the contralateral side. Minimal tenderness over the right sacroiliac joint and the right. Paraspinal musculature on the right side.      Impression and Recommendations:     This case required medical decision making of moderate complexity.

## 2016-01-23 NOTE — Assessment & Plan Note (Signed)
Concerned with patients rotator cuff tear that likely is going to worsen over the course of time. Patient understands this and does not want have any surgical intervention so no advance imaging will be warranted. Discussed we can repeat injections as needed every 3-4 months. Did not have significant improvement with the injection so likely will hold on this until absolutely necessary.

## 2016-01-23 NOTE — Patient Instructions (Signed)
Good to see you  Thigh compression would be great and try ours.  Gabapentin 100mg  at night to help with the nerve Keep trucking along If no improvement come back in 6 weeks and we may need to make sure this is not coming from your back hip.

## 2016-01-23 NOTE — Assessment & Plan Note (Signed)
Improved after injection. Encourage patient to continue home exercise. If worsening symptoms we can repeat every 3-4 months.

## 2016-01-23 NOTE — Assessment & Plan Note (Signed)
Patient is having some lumbar radiculopathy. I believe that she may even have some possible spinal stenosis with her having worsening symptoms when she is walking and seems to be improved with rest. I do not think that there is any sign of a herniated disc with patient not having any significant back pain. We discussed the possibility of different treatment options. Patient has elected try low dose gabapentin. Warned of potential side effects. We discussed compression sleeve. We discussed home exercises to strengthen the quadriceps muscles. Patient is going to try to do all these and see how she improves. Follow-up in 4 weeks. If continuing have pain we will need to consider further imaging as well as possible EMG and nerve conduction studies.

## 2016-02-19 DIAGNOSIS — M150 Primary generalized (osteo)arthritis: Secondary | ICD-10-CM | POA: Diagnosis not present

## 2016-02-19 DIAGNOSIS — M109 Gout, unspecified: Secondary | ICD-10-CM | POA: Diagnosis not present

## 2016-02-19 DIAGNOSIS — E119 Type 2 diabetes mellitus without complications: Secondary | ICD-10-CM | POA: Diagnosis not present

## 2016-02-19 DIAGNOSIS — I10 Essential (primary) hypertension: Secondary | ICD-10-CM | POA: Diagnosis not present

## 2016-02-19 DIAGNOSIS — M1029 Drug-induced gout, multiple sites: Secondary | ICD-10-CM | POA: Diagnosis not present

## 2016-03-05 ENCOUNTER — Ambulatory Visit: Payer: Medicare Other | Admitting: Family Medicine

## 2016-03-12 ENCOUNTER — Ambulatory Visit (INDEPENDENT_AMBULATORY_CARE_PROVIDER_SITE_OTHER): Payer: Medicare Other | Admitting: Family Medicine

## 2016-03-12 ENCOUNTER — Encounter: Payer: Self-pay | Admitting: Family Medicine

## 2016-03-12 ENCOUNTER — Other Ambulatory Visit (INDEPENDENT_AMBULATORY_CARE_PROVIDER_SITE_OTHER): Payer: Medicare Other

## 2016-03-12 VITALS — BP 110/70 | HR 84 | Ht 64.5 in | Wt 177.0 lb

## 2016-03-12 DIAGNOSIS — M129 Arthropathy, unspecified: Secondary | ICD-10-CM

## 2016-03-12 DIAGNOSIS — M19011 Primary osteoarthritis, right shoulder: Secondary | ICD-10-CM

## 2016-03-12 NOTE — Progress Notes (Signed)
[name] [address] [place], [place] [address] Phone: [phone] Subjective:    I'm seeing this patient by the request  of:  Foye Spurling, MD  CC: Right shoulder follow up  [id number] Teresa Barnes is a 70 y.o. female coming in with complaint of  Right shoulder pain. Patient was found to have mild arthritic changes but also had what appeared to be a full-thickness rotator cuff tear with mild retraction that healed with follow-up MRI.patient did make some improvement with the acromioclavicular injection 2 months ago. Patient had tramadol for break through pain. Was continuing to have weakness. Patient states she is doing very well until last week. Started having increasing pain on the top of her shoulder again. Denies any numbness or tingling. States that only when she makes certain movements or lays on that side. Concerned and she does not want have a severe pain when she had previously. Denies any radiation down the arm. States that she feels that her strength is improved. Has been more active recently.  .Patient did have an MRI showing tendinosis as well as mild glenohumeral degenerative changes with a superior labral degeneration.     Past Medical History  Diagnosis Date  . Diabetes mellitus without complication ([place])   . Hypertension   . Wears glasses   . Glaucoma   . Cancer [facility])     breast   Past Surgical History  Procedure Laterality Date  . Tubal ligation    . Colonoscopy    . Eye surgery  2011    lt eye blled  . Trigger finger release Right 06/24/2013    Procedure: RELEASE TRIGGER FINGER/A-1 PULLEY PIP RIGHT LONG FINGER;  Surgeon: Cammie Sickle., MD;  Location: [facility];  Service: Orthopedics;  Laterality: Right;  . Mastectomy Right 21 years ago   Social History   Social History  . Marital Status: Married    Spouse Name: N/A  . Number of Children: N/A  . Years of Education: N/A   Social History Main  Topics  . Smoking status: Former Smoker    Quit date: 06/18/1993  . Smokeless tobacco: Never Used  . Alcohol Use: No  . Drug Use: No  . Sexual Activity: Not Asked   Other Topics Concern  . None   Social History Narrative   No Known Allergies Family History  Problem Relation Age of Onset  . Colon cancer Neg Hx   . Esophageal cancer Neg Hx   . Stomach cancer Neg Hx   . Rectal cancer Neg Hx     Past medical history, social, surgical and family history all reviewed in electronic medical record.  No pertanent information unless stated regarding to the chief complaint.   Review of Systems: No headache, visual changes, nausea, vomiting, diarrhea, constipation, dizziness, abdominal pain, skin rash, fevers, chills, night sweats, weight loss, swollen lymph nodes, body aches, joint swelling, muscle aches, chest pain, shortness of breath, mood changes.   Objective Blood pressure 110/70, pulse 84, height 5' 4.5" (1.638 m), weight 177 lb (80.287 kg), SpO2 97 %.  General: No apparent distress alert and oriented x3 mood and affect normal, dressed appropriately.  HEENT: Pupils equal, extraocular movements intact  Respiratory: Patient's speak in full sentences and does not appear short of breath  Cardiovascular: No lower extremity edema, non tender, no erythema  Skin: Warm dry intact with no signs of infection or rash on extremities or on axial skeleton.  Abdomen:  Soft nontender  Neuro: Cranial nerves II through XII are intact, neurovascularly intact in all extremities with 2+ DTRs and 2+ pulses.  Lymph: No lymphadenopathy of posterior or anterior cervical chain or axillae bilaterally.  Gait normal with good balance and coordination.  MSK:  Non tender with full range of motion and good stability and symmetric strength and tone of elbows, wrist, hip, and ankles bilaterally.  Shoulder: Right Inspection reveals no abnormalities, atrophy or asymmetry. Increasing discomfort over the acromial  clavicular joint Near full range of motion except for crossover being positive Rotator cuff strength 4+ out of 5.full strength contralateral side Negative impingement but negative empty can sign. Speeds and Yergason's tests normal. Mild positiveO'Brien's and labral pathology Normal scapular function observed. Negative painful arc No apprehension sign Contralateral shoulder unremarkable.    Procedure: Real-time Ultrasound Guided Injection of right acromioclavicular joint Device: GE Logiq E  Ultrasound guided injection is preferred based studies that show increased duration, increased effect, greater accuracy, decreased procedural pain, increased response rate, and decreased cost with ultrasound guided versus blind injection.  Verbal informed consent obtained.  Time-out conducted.  Noted no overlying erythema, induration, or other signs of local infection.  Skin prepped in a sterile fashion.  Local anesthesia: Topical Ethyl chloride.  With sterile technique and under real time ultrasound guidance: With a 25-gauge 1 inch needle patient was injected with a total of 0.5 mL of 0.5% Marcaine and 0.5 mL of Kenalog 40 mg/dL this was done from a superior approach Completed without difficulty  Pain immediately resolved suggesting accurate placement of the medication.  Advised to call if fevers/chills, erythema, induration, drainage, or persistent bleeding.  Images permanently stored and available for review in the ultrasound unit.  Impression: Technically successful ultrasound guided injection.      Impression and Recommendations:     This case required medical decision making of moderate complexity.

## 2016-03-12 NOTE — Progress Notes (Signed)
Pre visit review using our clinic review tool, if applicable. No additional management support is needed unless otherwise documented below in the visit note. 

## 2016-03-12 NOTE — Patient Instructions (Signed)
Good to see you  I am sorry the shoulder is still giving you issues.  Lets inject the [facility] joint again.  Ice still can help 10-20 minutes multiple times a day  Still avoid heavy lifting above the head and try to keep hands within peripheral vision.  See me again when you need me.  Can repeat injection every 3 months.

## 2016-03-12 NOTE — Assessment & Plan Note (Signed)
Patient given another injection today and tolerated the procedure very well. We discussed icing regimen and home exercises. We discussed which activities to do an which was potentially avoid. Patient with continue with the topical anti-inflammatories and has tramadol for breakthrough pain. We discussed icing regimen. Patient declined formal physical therapy. We'll continue to monitor. Discussed with patient that I would like to wait doing this to frequently secondary to the risk of breaking down any excess cartilage. Patient will come back and see me again more on an as-needed basis at this point.  Spent  25 minutes with patient face-to-face and had greater than 50% of counseling including as described above in assessment and plan.

## 2016-04-03 ENCOUNTER — Ambulatory Visit (INDEPENDENT_AMBULATORY_CARE_PROVIDER_SITE_OTHER): Payer: Medicare Other | Admitting: Podiatry

## 2016-04-03 DIAGNOSIS — M79673 Pain in unspecified foot: Secondary | ICD-10-CM

## 2016-04-03 DIAGNOSIS — B351 Tinea unguium: Secondary | ICD-10-CM | POA: Diagnosis not present

## 2016-04-03 NOTE — Progress Notes (Signed)
Patient ID: Teresa Barnes, female   DOB: [birth year], 70 y.o.   MRN: [id number] Complaint:  Visit Type: Patient returns to my office for continued preventative foot care services. Complaint: Patient states" my nails have grown long and thick and become painful to walk and wear shoes" Patient has been diagnosed with DM with no foot complications. The patient presents for preventative foot care services. No changes to ROS  Podiatric Exam: Vascular: dorsalis pedis and posterior tibial pulses are palpable bilateral. Capillary return is immediate. Temperature gradient is WNL. Skin turgor WNL  Sensorium: Normal Semmes Weinstein monofilament test. Normal tactile sensation bilaterally. Nail Exam: Pt has thick disfigured discolored nails with subungual debris noted bilateral entire nail hallux through fifth toenails Ulcer Exam: There is no evidence of ulcer or pre-ulcerative changes or infection. Orthopedic Exam: Muscle tone and strength are WNL. No limitations in general ROM. No crepitus or effusions noted. Foot type and digits show no abnormalities. Bony prominences are unremarkable. Skin: No Porokeratosis. No infection or ulcers  Diagnosis:  Onychomycosis, , Pain in right toe, pain in left toes  Treatment & Plan Procedures and Treatment: Consent by patient was obtained for treatment procedures. The patient understood the discussion of treatment and procedures well. All questions were answered thoroughly reviewed. Debridement of mycotic and hypertrophic toenails, 1 through 5 bilateral and clearing of subungual debris. No ulceration, no infection noted.  Return Visit-Office Procedure: Patient instructed to return to the office for a follow up visit 3 months for continued evaluation and treatment.   Gardiner Barefoot DPM

## 2016-04-12 ENCOUNTER — Ambulatory Visit: Payer: Medicare Other | Admitting: Podiatry

## 2016-04-22 DIAGNOSIS — H40003 Preglaucoma, unspecified, bilateral: Secondary | ICD-10-CM | POA: Diagnosis not present

## 2016-04-22 DIAGNOSIS — H35371 Puckering of macula, right eye: Secondary | ICD-10-CM | POA: Diagnosis not present

## 2016-06-24 ENCOUNTER — Telehealth: Payer: Self-pay | Admitting: Family Medicine

## 2016-06-24 NOTE — Progress Notes (Signed)
[name] [place], [place]  [address] Phone: [phone] Subjective:    I'm seeing this patient by the request  of:  Foye Spurling, MD  CC:  Neck pain   [id number]  Teresa Barnes is a 70 y.o. female coming in with complaint of  Right-sided neck pain. Patient states that this is new. Started 4 days ago. Does not remember doing anything out of the ordinary. States it is more of a tightness. Worse when she tries to rotate her head to the right. States localized. No radiation down the arm. No numbness. Mild increase in headache because of the tightness she states. Has tried over-the-counter medications with no significant improvement. Rates the severity pain is 8 out of 10 and seems to be avulsed worsening.     Past Medical History:  Diagnosis Date  . Cancer ([place])    breast  . Diabetes mellitus without complication ([facility])   . Glaucoma   . Hypertension   . Wears glasses    Past Surgical History:  Procedure Laterality Date  . COLONOSCOPY    . EYE SURGERY  2011   lt eye blled  . MASTECTOMY Right 21 years ago  . TRIGGER FINGER RELEASE Right 06/24/2013   Procedure: RELEASE TRIGGER FINGER/A-1 PULLEY PIP RIGHT LONG FINGER;  Surgeon: Cammie Sickle., MD;  Location: [facility];  Service: Orthopedics;  Laterality: Right;  . TUBAL LIGATION     Social History   Social History  . Marital status: Married    Spouse name: N/A  . Number of children: N/A  . Years of education: N/A   Social History Main Topics  . Smoking status: Former Smoker    Quit date: 06/18/1993  . Smokeless tobacco: Never Used  . Alcohol use No  . Drug use: No  . Sexual activity: Not on file   Other Topics Concern  . Not on file   Social History Narrative  . No narrative on file   No Known Allergies Family History  Problem Relation Age of Onset  . Colon cancer Neg Hx   . Esophageal cancer Neg Hx   . Stomach cancer Neg Hx   . Rectal cancer  Neg Hx     Past medical history, social, surgical and family history all reviewed in electronic medical record.  No pertanent information unless stated regarding to the chief complaint.   Review of Systems: No headache, visual changes, nausea, vomiting, diarrhea, constipation, dizziness, abdominal pain, skin rash, fevers, chills, night sweats, weight loss, swollen lymph nodes, body aches, joint swelling, muscle aches, chest pain, shortness of breath, mood changes.   Objective  There were no vitals taken for this visit.  General: No apparent distress alert and oriented x3 mood and affect normal, dressed appropriately.  HEENT: Pupils equal, extraocular movements intact  Respiratory: Patient's speak in full sentences and does not appear short of breath  Cardiovascular: No lower extremity edema, non tender, no erythema  Skin: Warm dry intact with no signs of infection or rash on extremities or on axial skeleton.  Abdomen: Soft nontender  Neuro: Cranial nerves II through XII are intact, neurovascularly intact in all extremities with 2+ DTRs and 2+ pulses.  Lymph: No lymphadenopathy of posterior or anterior cervical chain or axillae bilaterally.  Gait normal with good balance and coordination.  MSK:  Non tender with full range of motion and good stability and symmetric strength and tone of elbows, wrist, hip, and ankles  bilaterally.  Neck: Inspection unremarkable.patient does have some tightness in the right trapezius No palpable stepoffs. Negative Spurling's maneuver. Lacks last 5 of side bending and rotation to the right. Grip strength and sensation normal in bilateral hands Strength good C4 to T1 distribution No sensory change to C4 to T1 Negative Hoffman sign bilaterally Reflexes normal Significant tightness in the right trapezius muscle.     Impression and Recommendations:     This case required medical decision making of moderate complexity.

## 2016-06-24 NOTE — Telephone Encounter (Signed)
Patient walked in today requesting that we fit her in. She states that she is in pain and cannot wait until the next available appt which is thurdsay. Please advise

## 2016-06-24 NOTE — Telephone Encounter (Signed)
lmovm for pt to return call.  

## 2016-06-24 NOTE — Telephone Encounter (Signed)
Spoke to pt, scheduled her for tomorrow at 845am w/ Dr. [name].

## 2016-06-25 ENCOUNTER — Encounter: Payer: Self-pay | Admitting: Family Medicine

## 2016-06-25 ENCOUNTER — Ambulatory Visit (INDEPENDENT_AMBULATORY_CARE_PROVIDER_SITE_OTHER): Payer: Medicare Other | Admitting: Family Medicine

## 2016-06-25 DIAGNOSIS — M62838 Other muscle spasm: Secondary | ICD-10-CM

## 2016-06-25 DIAGNOSIS — M6248 Contracture of muscle, other site: Secondary | ICD-10-CM

## 2016-06-25 MED ORDER — TIZANIDINE HCL 4 MG PO TABS: 4.0000 mg | ORAL_TABLET | Freq: Every evening | ORAL | 2 refills | Status: AC

## 2016-06-25 MED ORDER — MELOXICAM 15 MG PO TABS: 15.0000 mg | ORAL_TABLET | Freq: Every day | ORAL | 0 refills | Status: DC

## 2016-06-25 NOTE — Patient Instructions (Signed)
Good to see you  Heat 20 minutes, ice 20 minutes then off for 2 hours.  Repeat as many times through the day.  Meloxicam daily for 10 days Zanaflex at night to help with the muscle spasm and sleep OK to do icy hot.  See me again in 7-12 days if still in pain .

## 2016-06-25 NOTE — Assessment & Plan Note (Signed)
Patient does have some tightness of the neck. We discussed icing regimen, home exercise, we discussed which x-rays to do a which was potentially avoid. Patient will try topical over-the-counter medications, given meloxicam as well as a muscle relaxer to help at night. We discussed icing regimen. Patient will do some mild range of motion exercises at home. Patient will follow-up with me again in 2 weeks if not improved and then imaging would be needed likely formal physical therapy.

## 2016-07-03 ENCOUNTER — Ambulatory Visit (INDEPENDENT_AMBULATORY_CARE_PROVIDER_SITE_OTHER): Payer: Medicare Other | Admitting: Podiatry

## 2016-07-03 DIAGNOSIS — B351 Tinea unguium: Secondary | ICD-10-CM

## 2016-07-03 DIAGNOSIS — M79673 Pain in unspecified foot: Secondary | ICD-10-CM

## 2016-07-03 NOTE — Progress Notes (Signed)
[facility] [place], [place] [address] Phone: [phone] Subjective:    I'm seeing this patient by the request  of:  Foye Spurling, MD  CC:  Neck pain f/u   [id number]  Teresa Barnes is a 70 y.o. female coming in with complaint of  Right-sided neck pain. Son has significant tightness and trigger point in the right side of the neck. Patient elected try conservative therapy. Given medications. No side effects. Mild improvement when she is taking the medicines. Still has significant tightness on the right side. Still affecting some daily activities. Difficult to get comfortable at night.     Past Medical History:  Diagnosis Date  . Cancer ([place])    breast  . Diabetes mellitus without complication ([facility])   . Glaucoma   . Hypertension   . Wears glasses    Past Surgical History:  Procedure Laterality Date  . COLONOSCOPY    . EYE SURGERY  2011   lt eye blled  . MASTECTOMY Right 21 years ago  . TRIGGER FINGER RELEASE Right 06/24/2013   Procedure: RELEASE TRIGGER FINGER/A-1 PULLEY PIP RIGHT LONG FINGER;  Surgeon: Cammie Sickle., MD;  Location: [facility];  Service: Orthopedics;  Laterality: Right;  . TUBAL LIGATION     Social History   Social History  . Marital status: Married    Spouse name: N/A  . Number of children: N/A  . Years of education: N/A   Social History Main Topics  . Smoking status: Former Smoker    Quit date: 06/18/1993  . Smokeless tobacco: Never Used  . Alcohol use No  . Drug use: No  . Sexual activity: Not on file   Other Topics Concern  . Not on file   Social History Narrative  . No narrative on file   No Known Allergies Family History  Problem Relation Age of Onset  . Colon cancer Neg Hx   . Esophageal cancer Neg Hx   . Stomach cancer Neg Hx   . Rectal cancer Neg Hx     Past medical history, social, surgical and family history all reviewed in electronic medical record.  No  pertanent information unless stated regarding to the chief complaint.   Review of Systems: No headache, visual changes, nausea, vomiting, diarrhea, constipation, dizziness, abdominal pain, skin rash, fevers, chills, night sweats, weight loss, swollen lymph nodes, body aches, joint swelling, muscle aches, chest pain, shortness of breath, mood changes.   Objective  There were no vitals taken for this visit.  General: No apparent distress alert and oriented x3 mood and affect normal, dressed appropriately.  HEENT: Pupils equal, extraocular movements intact  Respiratory: Patient's speak in full sentences and does not appear short of breath  Cardiovascular: No lower extremity edema, non tender, no erythema  Skin: Warm dry intact with no signs of infection or rash on extremities or on axial skeleton.  Abdomen: Soft nontender  Neuro: Cranial nerves II through XII are intact, neurovascularly intact in all extremities with 2+ DTRs and 2+ pulses.  Lymph: No lymphadenopathy of posterior or anterior cervical chain or axillae bilaterally.  Gait normal with good balance and coordination.  MSK:  Non tender with full range of motion and good stability and symmetric strength and tone of elbows, wrist, hip, and ankles bilaterally.  Neck: Inspection unremarkable.patient does have some tightness in the right trapezius No palpable stepoffs. Negative Spurling's maneuver. Lacks last 5 of side bending and rotation  to the right. Grip strength and sensation normal in bilateral hands Strength good C4 to T1 distribution No sensory change to C4 to T1 Negative Hoffman sign bilaterally Reflexes normal Significant tightness in the right trapezius muscle. 4 trigger points noted Mild worsening from previous exam.   Verbal consent patient was prepped with alcohol swab and with a 25-gauge half-inch needle was injected in for specific trigger points in the right trapezius muscle. Total of 3 mL of 0.5% Marcaine and 1 mL of  Kenalog 40 mg/dL used.     Impression and Recommendations:     This case required medical decision making of moderate complexity.

## 2016-07-03 NOTE — Progress Notes (Signed)
Patient ID: Teresa Barnes, female   DOB: [birth year], 70 y.o.   MRN: [id number] Complaint:  Visit Type: Patient returns to my office for continued preventative foot care services. Complaint: Patient states" my nails have grown long and thick and become painful to walk and wear shoes" Patient has been diagnosed with DM with no foot complications. The patient presents for preventative foot care services. No changes to ROS  Podiatric Exam: Vascular: dorsalis pedis and posterior tibial pulses are palpable bilateral. Capillary return is immediate. Temperature gradient is WNL. Skin turgor WNL  Sensorium: Normal Semmes Weinstein monofilament test. Normal tactile sensation bilaterally. Nail Exam: Pt has thick disfigured discolored nails with subungual debris noted bilateral entire nail hallux through fifth toenails Ulcer Exam: There is no evidence of ulcer or pre-ulcerative changes or infection. Orthopedic Exam: Muscle tone and strength are WNL. No limitations in general ROM. No crepitus or effusions noted. Foot type and digits show no abnormalities. Bony prominences are unremarkable. Skin: No Porokeratosis. No infection or ulcers  Diagnosis:  Onychomycosis, , Pain in right toe, pain in left toes  Treatment & Plan Procedures and Treatment: Consent by patient was obtained for treatment procedures. The patient understood the discussion of treatment and procedures well. All questions were answered thoroughly reviewed. Debridement of mycotic and hypertrophic toenails, 1 through 5 bilateral and clearing of subungual debris. No ulceration, no infection noted.  Return Visit-Office Procedure: Patient instructed to return to the office for a follow up visit 3 months for continued evaluation and treatment.   Gardiner Barefoot DPM

## 2016-07-04 ENCOUNTER — Encounter: Payer: Self-pay | Admitting: Family Medicine

## 2016-07-04 ENCOUNTER — Ambulatory Visit (INDEPENDENT_AMBULATORY_CARE_PROVIDER_SITE_OTHER)
Admission: RE | Admit: 2016-07-04 | Discharge: 2016-07-04 | Disposition: A | Discharge status: Home / Self Care | Payer: Medicare Other | Source: Ambulatory Visit | Attending: Family Medicine | Admitting: Family Medicine

## 2016-07-04 ENCOUNTER — Ambulatory Visit (INDEPENDENT_AMBULATORY_CARE_PROVIDER_SITE_OTHER): Payer: Medicare Other | Admitting: Family Medicine

## 2016-07-04 DIAGNOSIS — M542 Cervicalgia: Secondary | ICD-10-CM | POA: Diagnosis not present

## 2016-07-04 DIAGNOSIS — M62838 Other muscle spasm: Secondary | ICD-10-CM

## 2016-07-04 DIAGNOSIS — M25511 Pain in right shoulder: Secondary | ICD-10-CM

## 2016-07-04 DIAGNOSIS — M6248 Contracture of muscle, other site: Secondary | ICD-10-CM

## 2016-07-04 NOTE — Patient Instructions (Signed)
Good to see you  I am sorry not all the way better Continue the medicines We tried trigger injections today  Physical therapy will be calling you  Lets also get a xray of your neck  Keep up with the exercises.  See me again in 4 weeks.

## 2016-07-04 NOTE — Addendum Note (Signed)
Addended by: Douglass Rivers T on: 07/04/2016 09:23 AM   Modules accepted: Orders

## 2016-07-04 NOTE — Assessment & Plan Note (Signed)
Sent to PT

## 2016-07-04 NOTE — Assessment & Plan Note (Signed)
Responded well to the injections. I do think that there is still a possibility for some cervical radiculopathy and x-rays were ordered today. Patient is going to formal physical therapy and see if other modalities can also be beneficial. We discussed icing regimen. Patient come back and see me again in 3-4 weeks.

## 2016-07-08 DIAGNOSIS — I10 Essential (primary) hypertension: Secondary | ICD-10-CM | POA: Diagnosis not present

## 2016-07-08 DIAGNOSIS — M150 Primary generalized (osteo)arthritis: Secondary | ICD-10-CM | POA: Diagnosis not present

## 2016-07-08 DIAGNOSIS — E119 Type 2 diabetes mellitus without complications: Secondary | ICD-10-CM | POA: Diagnosis not present

## 2016-07-08 DIAGNOSIS — D649 Anemia, unspecified: Secondary | ICD-10-CM | POA: Diagnosis not present

## 2016-07-19 DIAGNOSIS — Z5189 Encounter for other specified aftercare: Secondary | ICD-10-CM

## 2016-07-19 HISTORY — DX: Encounter for other specified aftercare: Z51.89

## 2016-07-30 ENCOUNTER — Inpatient Hospital Stay (HOSPITAL_BASED_OUTPATIENT_CLINIC_OR_DEPARTMENT_OTHER)
Admission: EM | Admit: 2016-07-30 | Discharge: 2016-08-01 | DRG: 381 | Disposition: A | Discharge status: Home / Self Care | Payer: Medicare Other | Attending: Internal Medicine | Admitting: Internal Medicine

## 2016-07-30 ENCOUNTER — Emergency Department (HOSPITAL_BASED_OUTPATIENT_CLINIC_OR_DEPARTMENT_OTHER): Payer: Medicare Other

## 2016-07-30 ENCOUNTER — Encounter (HOSPITAL_BASED_OUTPATIENT_CLINIC_OR_DEPARTMENT_OTHER): Payer: Self-pay

## 2016-07-30 DIAGNOSIS — K2970 Gastritis, unspecified, without bleeding: Secondary | ICD-10-CM | POA: Diagnosis not present

## 2016-07-30 DIAGNOSIS — E1122 Type 2 diabetes mellitus with diabetic chronic kidney disease: Secondary | ICD-10-CM | POA: Diagnosis not present

## 2016-07-30 DIAGNOSIS — K209 Esophagitis, unspecified without bleeding: Secondary | ICD-10-CM

## 2016-07-30 DIAGNOSIS — K2211 Ulcer of esophagus with bleeding: Principal | ICD-10-CM | POA: Diagnosis present

## 2016-07-30 DIAGNOSIS — H409 Unspecified glaucoma: Secondary | ICD-10-CM | POA: Diagnosis not present

## 2016-07-30 DIAGNOSIS — Z79899 Other long term (current) drug therapy: Secondary | ICD-10-CM

## 2016-07-30 DIAGNOSIS — Z853 Personal history of malignant neoplasm of breast: Secondary | ICD-10-CM

## 2016-07-30 DIAGNOSIS — D62 Acute posthemorrhagic anemia: Secondary | ICD-10-CM | POA: Diagnosis not present

## 2016-07-30 DIAGNOSIS — I129 Hypertensive chronic kidney disease with stage 1 through stage 4 chronic kidney disease, or unspecified chronic kidney disease: Secondary | ICD-10-CM | POA: Diagnosis not present

## 2016-07-30 DIAGNOSIS — Z86010 Personal history of colonic polyps: Secondary | ICD-10-CM | POA: Diagnosis not present

## 2016-07-30 DIAGNOSIS — D72829 Elevated white blood cell count, unspecified: Secondary | ICD-10-CM | POA: Diagnosis not present

## 2016-07-30 DIAGNOSIS — Z23 Encounter for immunization: Secondary | ICD-10-CM

## 2016-07-30 DIAGNOSIS — E871 Hypo-osmolality and hyponatremia: Secondary | ICD-10-CM | POA: Diagnosis present

## 2016-07-30 DIAGNOSIS — Z7984 Long term (current) use of oral hypoglycemic drugs: Secondary | ICD-10-CM

## 2016-07-30 DIAGNOSIS — K921 Melena: Secondary | ICD-10-CM | POA: Diagnosis not present

## 2016-07-30 DIAGNOSIS — I10 Essential (primary) hypertension: Secondary | ICD-10-CM | POA: Diagnosis not present

## 2016-07-30 DIAGNOSIS — Z8719 Personal history of other diseases of the digestive system: Secondary | ICD-10-CM | POA: Diagnosis present

## 2016-07-30 DIAGNOSIS — K219 Gastro-esophageal reflux disease without esophagitis: Secondary | ICD-10-CM | POA: Diagnosis present

## 2016-07-30 DIAGNOSIS — R1110 Vomiting, unspecified: Secondary | ICD-10-CM | POA: Diagnosis not present

## 2016-07-30 DIAGNOSIS — N179 Acute kidney failure, unspecified: Secondary | ICD-10-CM | POA: Diagnosis not present

## 2016-07-30 DIAGNOSIS — N182 Chronic kidney disease, stage 2 (mild): Secondary | ICD-10-CM | POA: Diagnosis present

## 2016-07-30 DIAGNOSIS — Z8711 Personal history of peptic ulcer disease: Secondary | ICD-10-CM

## 2016-07-30 DIAGNOSIS — E1165 Type 2 diabetes mellitus with hyperglycemia: Secondary | ICD-10-CM | POA: Diagnosis present

## 2016-07-30 DIAGNOSIS — K449 Diaphragmatic hernia without obstruction or gangrene: Secondary | ICD-10-CM | POA: Diagnosis not present

## 2016-07-30 DIAGNOSIS — R739 Hyperglycemia, unspecified: Secondary | ICD-10-CM

## 2016-07-30 DIAGNOSIS — K2971 Gastritis, unspecified, with bleeding: Secondary | ICD-10-CM | POA: Diagnosis not present

## 2016-07-30 DIAGNOSIS — R42 Dizziness and giddiness: Secondary | ICD-10-CM | POA: Diagnosis present

## 2016-07-30 DIAGNOSIS — D649 Anemia, unspecified: Secondary | ICD-10-CM | POA: Diagnosis not present

## 2016-07-30 DIAGNOSIS — K5900 Constipation, unspecified: Secondary | ICD-10-CM | POA: Diagnosis present

## 2016-07-30 DIAGNOSIS — K922 Gastrointestinal hemorrhage, unspecified: Secondary | ICD-10-CM

## 2016-07-30 DIAGNOSIS — Z87891 Personal history of nicotine dependence: Secondary | ICD-10-CM

## 2016-07-30 LAB — CBC WITH DIFFERENTIAL/PLATELET
BASOS ABS: 0 10*3/uL (ref 0.0–0.1)
BASOS PCT: 0 %
Eosinophils Absolute: 0 10*3/uL (ref 0.0–0.7)
Eosinophils Relative: 0 %
HEMATOCRIT: 22.1 % — AB (ref 36.0–46.0)
HEMOGLOBIN: 7.3 g/dL — AB (ref 12.0–15.0)
Lymphocytes Relative: 7 %
Lymphs Abs: 0.8 10*3/uL (ref 0.7–4.0)
MCH: 29.7 pg (ref 26.0–34.0)
MCHC: 33 g/dL (ref 30.0–36.0)
MCV: 89.8 fL (ref 78.0–100.0)
Monocytes Absolute: 0.4 10*3/uL (ref 0.1–1.0)
Monocytes Relative: 3 %
NEUTROS ABS: 10.2 10*3/uL — AB (ref 1.7–7.7)
NEUTROS PCT: 90 %
Platelets: 396 10*3/uL (ref 150–400)
RBC: 2.46 MIL/uL — ABNORMAL LOW (ref 3.87–5.11)
RDW: 14.3 % (ref 11.5–15.5)
WBC: 11.4 10*3/uL — AB (ref 4.0–10.5)

## 2016-07-30 LAB — COMPREHENSIVE METABOLIC PANEL
ALK PHOS: 46 U/L (ref 38–126)
ALT: 14 U/L (ref 14–54)
ANION GAP: 13 (ref 5–15)
AST: 18 U/L (ref 15–41)
Albumin: 3.7 g/dL (ref 3.5–5.0)
BILIRUBIN TOTAL: 0.6 mg/dL (ref 0.3–1.2)
BUN: 68 mg/dL — ABNORMAL HIGH (ref 6–20)
CALCIUM: 9.8 mg/dL (ref 8.9–10.3)
CO2: 19 mmol/L — AB (ref 22–32)
Chloride: 101 mmol/L (ref 101–111)
Creatinine, Ser: 1.58 mg/dL — ABNORMAL HIGH (ref 0.44–1.00)
GFR calc Af Amer: 37 mL/min — ABNORMAL LOW (ref >60)
GFR calc non Af Amer: 32 mL/min — ABNORMAL LOW (ref >60)
Glucose, Bld: 285 mg/dL — ABNORMAL HIGH (ref 65–99)
Potassium: 4.7 mmol/L (ref 3.5–5.1)
SODIUM: 133 mmol/L — AB (ref 135–145)
TOTAL PROTEIN: 6.8 g/dL (ref 6.5–8.1)

## 2016-07-30 LAB — GLUCOSE, CAPILLARY
GLUCOSE-CAPILLARY: 173 mg/dL — AB (ref 65–99)
Glucose-Capillary: 163 mg/dL — ABNORMAL HIGH (ref 65–99)

## 2016-07-30 LAB — OCCULT BLOOD X 1 CARD TO LAB, STOOL: Fecal Occult Bld: POSITIVE — AB

## 2016-07-30 LAB — ABO/RH: ABO/RH(D): O POS

## 2016-07-30 LAB — PREPARE RBC (CROSSMATCH)

## 2016-07-30 LAB — CBG MONITORING, ED: Glucose-Capillary: 307 mg/dL — ABNORMAL HIGH (ref 65–99)

## 2016-07-30 MED ORDER — INSULIN ASPART 100 UNIT/ML ~~LOC~~ SOLN
0.0000 [IU] | Freq: Three times a day (TID) | SUBCUTANEOUS | Status: DC
  Administered 2016-07-31: 3 [IU] via SUBCUTANEOUS
  Administered 2016-07-31: 2 [IU] via SUBCUTANEOUS
  Administered 2016-08-01: 3 [IU] via SUBCUTANEOUS

## 2016-07-30 MED ORDER — ONDANSETRON HCL 4 MG PO TABS: 4.0000 mg | ORAL_TABLET | Freq: Four times a day (QID) | ORAL | Status: DC | PRN

## 2016-07-30 MED ORDER — ONDANSETRON HCL 4 MG/2ML IJ SOLN: 4.0000 mg | Freq: Four times a day (QID) | INTRAMUSCULAR | Status: DC | PRN

## 2016-07-30 MED ORDER — INFLUENZA VAC SPLIT QUAD 0.5 ML IM SUSY
0.5000 mL | PREFILLED_SYRINGE | INTRAMUSCULAR | Status: AC
Start: 2016-07-31 — End: 2016-08-01
  Administered 2016-08-01: 0.5 mL via INTRAMUSCULAR
  Filled 2016-07-30 (×3): qty 0.5

## 2016-07-30 MED ORDER — PRAVASTATIN SODIUM 20 MG PO TABS
20.0000 mg | ORAL_TABLET | Freq: Every day | ORAL | Status: DC
  Administered 2016-07-31: 20 mg via ORAL
  Filled 2016-07-30 (×2): qty 1

## 2016-07-30 MED ORDER — POLYVINYL ALCOHOL 1.4 % OP SOLN
1.0000 [drp] | Freq: Three times a day (TID) | OPHTHALMIC | Status: DC | PRN
  Administered 2016-07-30 – 2016-08-01 (×3): 1 [drp] via OPHTHALMIC
  Filled 2016-07-30: qty 15

## 2016-07-30 MED ORDER — ALLOPURINOL 100 MG PO TABS
100.0000 mg | ORAL_TABLET | Freq: Every day | ORAL | Status: DC
  Administered 2016-07-30 – 2016-08-01 (×3): 100 mg via ORAL
  Filled 2016-07-30 (×3): qty 1

## 2016-07-30 MED ORDER — SODIUM CHLORIDE 0.9 % IV BOLUS (SEPSIS)
500.0000 mL | Freq: Once | INTRAVENOUS | Status: AC
  Administered 2016-07-30: 500 mL via INTRAVENOUS

## 2016-07-30 MED ORDER — FOLIC ACID 1 MG PO TABS
1.0000 mg | ORAL_TABLET | Freq: Every day | ORAL | Status: DC
  Administered 2016-07-30 – 2016-08-01 (×3): 1 mg via ORAL
  Filled 2016-07-30 (×3): qty 1

## 2016-07-30 MED ORDER — SODIUM CHLORIDE 0.9 % IV SOLN
Freq: Once | INTRAVENOUS | Status: AC
  Administered 2016-07-30: 21:00:00 via INTRAVENOUS

## 2016-07-30 MED ORDER — ACETAMINOPHEN 325 MG PO TABS
650.0000 mg | ORAL_TABLET | Freq: Four times a day (QID) | ORAL | Status: DC | PRN
  Administered 2016-07-30: 650 mg via ORAL
  Filled 2016-07-30: qty 2

## 2016-07-30 MED ORDER — LATANOPROST 0.005 % OP SOLN
1.0000 [drp] | Freq: Every day | OPHTHALMIC | Status: DC
  Administered 2016-07-30 – 2016-07-31 (×2): 1 [drp] via OPHTHALMIC
  Filled 2016-07-30: qty 2.5

## 2016-07-30 MED ORDER — ACETAMINOPHEN 650 MG RE SUPP
650.0000 mg | Freq: Four times a day (QID) | RECTAL | Status: DC | PRN
Start: 2016-07-30 — End: 2016-08-01

## 2016-07-30 MED ORDER — PROPYLENE GLYCOL 0.6 % OP SOLN: 1.0000 [drp] | Freq: Three times a day (TID) | OPHTHALMIC | Status: DC | PRN

## 2016-07-30 MED ORDER — PANTOPRAZOLE SODIUM 40 MG IV SOLR
40.0000 mg | Freq: Two times a day (BID) | INTRAVENOUS | Status: DC
  Administered 2016-07-30 – 2016-07-31 (×2): 40 mg via INTRAVENOUS
  Filled 2016-07-30 (×2): qty 40

## 2016-07-30 MED ORDER — AMLODIPINE BESYLATE 10 MG PO TABS
10.0000 mg | ORAL_TABLET | Freq: Every day | ORAL | Status: DC
  Administered 2016-08-01: 10 mg via ORAL
  Filled 2016-07-30 (×3): qty 1

## 2016-07-30 NOTE — H&P (Signed)
History and Physical    Teresa Barnes WPY:[id number] DOB: 1946/02/02 DOA: 07/30/2016  PCP: Foye Spurling, MD Consultants:  Ophthalmology - [facility] Patient coming from: home - lives with husband and often daughter  Chief Complaint: dizzy, weak   HPI: Teresa Barnes is a 69 y.o. female with medical history significant of remote breast cancer, DM, HTN, and glaucoma.  Patient awoke this AM and felt dizzy and weak.  Felt like sugar level was up.  Son came over and checked - it was 64.  He gave her the usual diabetes medications - Victoza, Metformin and Glipizide (this is a prn medication); she did not take her other medications this AM.  Patient felt fine yesterday, no problems.  Sugar was in 120s yesterday.  Not lightheaded or SOB.  Dizzy and weak today.  Had to have assistance today to move around.  Last BM 7 days ago.  Finally pooped liquid stools x 2 today, dark like coffee.  Takes iron pills for anemia at baseline, but current Hgb is 7.3, down from usual 9 range.  Family brought one of the stools in to the ER and it looked like melena and was heme positive.  She sees Dr. Henrene Pastor for a c-scope every 5 years due to a personal h/o adenomatous polyps.  Last scope was in 05/2014 and was normal.     ED Course: Per Dr. Alvino Chapel:  Patient resents with weakness. Found to be hyperglycemic. Also has had some black stools after constipation. She is however on iron and has a history of chronic anemia.   Hemoglobin Here found to be 7. States she's been lightheaded and dizzy.  Black stool is guaiac positive. will admit to internal medicine after return of the rest of the labs.   Review of Systems: As per HPI; otherwise 10 point review of systems reviewed and negative.    Ambulatory Status:  Walks without assistance  Past Medical History:  Diagnosis Date  . Cancer ([place])    breast  . Diabetes mellitus without complication ([facility])   . Glaucoma   . Hypertension   . Wears glasses     Past Surgical  History:  Procedure Laterality Date  . COLONOSCOPY    . EYE SURGERY  2011   lt eye blled  . MASTECTOMY Right 21 years ago  . TRIGGER FINGER RELEASE Right 06/24/2013   Procedure: RELEASE TRIGGER FINGER/A-1 PULLEY PIP RIGHT LONG FINGER;  Surgeon: Cammie Sickle., MD;  Location: [facility];  Service: Orthopedics;  Laterality: Right;  . TUBAL LIGATION      Social History   Social History  . Marital status: Married    Spouse name: N/A  . Number of children: N/A  . Years of education: N/A   Occupational History  . Not on file.   Social History Main Topics  . Smoking status: Former Smoker    Quit date: 06/18/1993  . Smokeless tobacco: Never Used  . Alcohol use No  . Drug use: No  . Sexual activity: Not on file   Other Topics Concern  . Not on file   Social History Narrative  . No narrative on file    No Known Allergies  Family History  Problem Relation Age of Onset  . Colon cancer Neg Hx   . Esophageal cancer Neg Hx   . Stomach cancer Neg Hx   . Rectal cancer Neg Hx     Prior to Admission medications   Medication Sig Start  Date End Date Taking? Authorizing Provider  allopurinol (ZYLOPRIM) 100 MG tablet  10/26/13  Yes Historical Provider, MD  bimatoprost (LUMIGAN) 0.03 % ophthalmic solution 1 drop at bedtime.    Yes Historical Provider, MD  COMBIGAN 0.2-0.5 % ophthalmic solution  11/05/13  Yes Historical Provider, MD  gabapentin (NEURONTIN) 100 MG capsule Take 1 capsule (100 mg total) by mouth at bedtime. 01/23/16  Yes Lyndal Pulley, DO  glipiZIDE (GLUCOTROL) 5 MG tablet Take 5 mg by mouth daily.   Yes Historical Provider, MD  latanoprost (XALATAN) 0.005 % ophthalmic solution  03/09/16  Yes Historical Provider, MD  lovastatin (MEVACOR) 20 MG tablet  03/04/16  Yes Historical Provider, MD  metFORMIN (GLUCOPHAGE) 1000 MG tablet Take 1,000 mg by mouth 2 (two) times daily with a meal.    Yes Historical Provider, MD  NOVOFINE PLUS 32G X 4 MM MISC  01/25/16  Yes  Historical Provider, MD  VICTOZA 18 MG/3ML SOPN  04/02/16  Yes Historical Provider, MD  Exenatide (BYETTA 10 MCG PEN [place]) Inject 10 mg into the skin 2 (two) times daily.     Historical Provider, MD  HYDROcodone-acetaminophen (NORCO/VICODIN) 5-325 MG per tablet Take 1 tablet by mouth every 4 (four) hours as needed. 06/24/13   Theodis Sato, MD  lisinopril-hydrochlorothiazide (PRINZIDE,ZESTORETIC) 20-12.5 MG per tablet Take 1 tablet by mouth daily.      Historical Provider, MD  meloxicam (MOBIC) 15 MG tablet Take 1 tablet (15 mg total) by mouth daily. 06/25/16   Lyndal Pulley, DO  simvastatin (ZOCOR) 20 MG tablet Take 20 mg by mouth every evening.    Historical Provider, MD  traMADol (ULTRAM) 50 MG tablet Take 1 tablet (50 mg total) by mouth at bedtime as needed. 12/15/15   Lyndal Pulley, DO    Physical Exam: Vitals:   07/30/16 1530 07/30/16 1600 07/30/16 1700 07/30/16 1748  BP: (!) 123/45 (!) 107/49  (!) 117/50  Pulse: 94 92  97  Resp: 23 22  19   Temp:    98.2 F (36.8 C)  TempSrc:    Oral  SpO2: 98% 100%  100%  Weight:   78 kg (172 lb)   Height:          General:  Appears calm and comfortable and is NAD  Eyes:  EOMI, normal lids, iris  ENT:  grossly normal hearing, lips & tongue, mmm  Neck: no LAD, masses or thyromegaly  Cardiovascular: RRR, no m/r/g. No LE edema.   Respiratory: CTA bilaterally, no w/r/r. Normal respiratory effort.  Abdomen: soft, ntnd, NABS  Skin: no rash or induration seen on limited exam  Musculoskeletal:  grossly normal tone BUE/BLE, good ROM, no bony abnormality  Psychiatric:  grossly normal mood and affect, speech fluent and appropriate, AOx3  Neurologic: CN 2-12 grossly intact, moves all extremities in coordinated fashion, sensation intact  Labs on Admission: I have personally reviewed following labs and imaging studies  CBC:  Recent Labs Lab 07/30/16 1425  WBC 11.4*  NEUTROABS 10.2*  HGB 7.3*  HCT 22.1*  MCV 89.8  PLT 425   Basic  Metabolic Panel:  Recent Labs Lab 07/30/16 1425  NA 133*  K 4.7  CL 101  CO2 19*  GLUCOSE 285*  BUN 68*  CREATININE 1.58*  CALCIUM 9.8   GFR: Estimated Creatinine Clearance: 33 mL/min (by C-G formula based on SCr of 1.58 mg/dL). Liver Function Tests:  Recent Labs Lab 07/30/16 1425  AST 18  ALT 14  ALKPHOS 46  BILITOT 0.6  PROT 6.8  ALBUMIN 3.7   No results for input(s): LIPASE, AMYLASE in the last 168 hours. No results for input(s): AMMONIA in the last 168 hours. Coagulation Profile: No results for input(s): INR, PROTIME in the last 168 hours. Cardiac Enzymes: No results for input(s): CKTOTAL, CKMB, CKMBINDEX, TROPONINI in the last 168 hours. BNP (last 3 results) No results for input(s): PROBNP in the last 8760 hours. HbA1C: No results for input(s): HGBA1C in the last 72 hours. CBG:  Recent Labs Lab 07/30/16 1340 07/30/16 1753  GLUCAP 307* 163*   Lipid Profile: No results for input(s): CHOL, HDL, LDLCALC, TRIG, CHOLHDL, LDLDIRECT in the last 72 hours. Thyroid Function Tests: No results for input(s): TSH, T4TOTAL, FREET4, T3FREE, THYROIDAB in the last 72 hours. Anemia Panel: No results for input(s): VITAMINB12, FOLATE, FERRITIN, TIBC, IRON, RETICCTPCT in the last 72 hours. Urine analysis: No results found for: COLORURINE, APPEARANCEUR, LABSPEC, PHURINE, GLUCOSEU, HGBUR, BILIRUBINUR, KETONESUR, PROTEINUR, UROBILINOGEN, NITRITE, LEUKOCYTESUR  Creatinine Clearance: Estimated Creatinine Clearance: 33 mL/min (by C-G formula based on SCr of 1.58 mg/dL).  Sepsis Labs: @LABRCNTIP (procalcitonin:4,lacticidven:4) )No results found for this or any previous visit (from the past 240 hour(s)).   Radiological Exams on Admission: Dg Chest 2 View  Result Date: 07/30/2016 CLINICAL DATA:  Generalized weakness and vomiting starting this morning EXAM: CHEST  2 VIEW COMPARISON:  August 14, 2010 FINDINGS: The heart size and mediastinal contours are within normal limits.  There is no focal infiltrate, pulmonary edema, or pleural effusion. Calcified granulomas of the left lung base are unchanged. Surgical clips are identified in the right axilla. The visualized skeletal structures are unremarkable. IMPRESSION: No active cardiopulmonary disease. Electronically Signed   By: Abelardo Diesel M.D.   On: 07/30/2016 15:00    EKG: Not done  Assessment/Plan Active Problems:   GI bleed Other problems:    Hyponatremia    AKI    DM, suboptimally controlled  GI bleed -Patient most likely has upper GI bleeding - despite no significant abdominal pain, she has dark/tarry stools and an elevated BUN (not proportional to creatinine increase). -Patient was prescribed Mobic on 8/8 and took a few doses, but stopped it because she was concerned about its effect on her kidneys. -Prior Hgb on 06/24/13 was 9.6, now 7.3. -Will transfuse, patient consented and agrees that she would like to have blood products.  - will admit to SDU bed - GI consulted (I spoke with Dr. Carlean Purl), they will see patient in AM and see if they can perform an EGD tomorrow - NPO after midnight for possible EGD - NS at 150 mL/hr when not receiving blood products - Start IV pantoprazole 40 mg bid - Zofran IV for nausea - Avoid NSAIDs and SQ heparin/Lovenox - Maintain IV access (2 large bore IVs if possible). - Monitor closely and follow up cbc post-transfusion and in AM, transfuse as necessary.  Hyponatremia  -Mild, likely related to volume deficiency -Will rehydrate and follow  AKI -Prior creatinine in 8/14 was 1.22, currently 1.58  -While this could indicate progression of CKD, she does appear to have mild volume deficiency and this may improve with hydration -Will follow  DM -Patient reports generally good diabetes control.   -Today's hyperglycemia may be a stress response to acute illness, specifically GI bleed -Patient reports A1c <6 with her 3 non-insulin agents (including only prn  glipizide) -Will cover with SSI but likely able to discharge without changes to home medications  Glaucoma -Continue home meds  HTN -  Hold HCTZ due to AKI but continue Norvasc -Previously prescribed Lisinopril but does not appear to be taking it    DVT prophylaxis:  SCDs Code Status:  Full - confirmed with patient/family Family Communication: Husband present at the end of our visit Disposition Plan: Home once clinically improved Consults called: GI Admission status: Admit to Med Surg     Karmen Bongo MD [facility]  If 7PM-7AM, please contact night-coverage [email or web address] Password [facility]  07/30/2016, 6:25 PM

## 2016-07-30 NOTE — ED Notes (Signed)
Patient transported to X-ray 

## 2016-07-30 NOTE — ED Notes (Signed)
Triage CBG 307.

## 2016-07-30 NOTE — ED Notes (Signed)
Patient presents with weakness, blood glucose in the 300s, and constipation x 1 week.  This AM, she had nausea and vomiting, which looked like digested food. Had large BM at home.  Brought sample with because husband thought "it didn't look right."  Patient sts that she was put on iron tablets in May because of anemia and will go back to her MD in November.

## 2016-07-30 NOTE — ED Notes (Signed)
[facility]--transferring patient to room at [id number]

## 2016-07-30 NOTE — ED Provider Notes (Signed)
[facility] DEPT [facility] Provider Note   CSN: [id number] Arrival date & time: 07/30/16  1335     History   Chief Complaint Chief Complaint  Patient presents with  . Weakness    HPI Teresa Barnes is a 70 y.o. female.  HPI Patient presents with generalized weakness. States she feels lightheaded. She has had occasional nausea and vomiting. States she's had some constipation that has since turned into a black stool. She is on iron. Patient's family member states there was some red in the stool. No fevers. No cough. No chest pain. She has a history of anemia and is on B-12 folate and iron. Primary care doctor is Dr. Carlis Abbott. No headache. The patient's family member brought some black melanotic stool with her in a bag. CBG this morning was 378. Past Medical History:  Diagnosis Date  . Cancer ([place])    breast  . Diabetes mellitus without complication ([place])   . Glaucoma   . Hypertension   . Wears glasses     Patient Active Problem List   Diagnosis Date Noted  . Trigger point of right shoulder region 07/04/2016  . Muscle spasms of neck 06/25/2016  . Right lumbar radiculopathy 01/23/2016  . Arthritis of right acromioclavicular joint 01/02/2016  . Right rotator cuff tear 11/24/2015  . Hamstring tightness of right lower extremity 11/24/2015  . DM 04/03/2009    Past Surgical History:  Procedure Laterality Date  . COLONOSCOPY    . EYE SURGERY  2011   lt eye blled  . MASTECTOMY Right 21 years ago  . TRIGGER FINGER RELEASE Right 06/24/2013   Procedure: RELEASE TRIGGER FINGER/A-1 PULLEY PIP RIGHT LONG FINGER;  Surgeon: Cammie Sickle., MD;  Location: [facility];  Service: Orthopedics;  Laterality: Right;  . TUBAL LIGATION      OB History    No data available       Home Medications    Prior to Admission medications   Medication Sig Start Date End Date Taking? Authorizing Provider  allopurinol (ZYLOPRIM) 100 MG tablet  10/26/13  Yes Historical Provider, MD    bimatoprost (LUMIGAN) 0.03 % ophthalmic solution 1 drop at bedtime.    Yes Historical Provider, MD  COMBIGAN 0.2-0.5 % ophthalmic solution  11/05/13  Yes Historical Provider, MD  gabapentin (NEURONTIN) 100 MG capsule Take 1 capsule (100 mg total) by mouth at bedtime. 01/23/16  Yes Lyndal Pulley, DO  glipiZIDE (GLUCOTROL) 5 MG tablet Take 5 mg by mouth daily.   Yes Historical Provider, MD  latanoprost (XALATAN) 0.005 % ophthalmic solution  03/09/16  Yes Historical Provider, MD  lovastatin (MEVACOR) 20 MG tablet  03/04/16  Yes Historical Provider, MD  metFORMIN (GLUCOPHAGE) 1000 MG tablet Take 1,000 mg by mouth 2 (two) times daily with a meal.    Yes Historical Provider, MD  NOVOFINE PLUS 32G X 4 MM MISC  01/25/16  Yes Historical Provider, MD  VICTOZA 18 MG/3ML SOPN  04/02/16  Yes Historical Provider, MD  Exenatide (BYETTA 10 MCG PEN [place]) Inject 10 mg into the skin 2 (two) times daily.     Historical Provider, MD  HYDROcodone-acetaminophen (NORCO/VICODIN) 5-325 MG per tablet Take 1 tablet by mouth every 4 (four) hours as needed. 06/24/13   Theodis Sato, MD  lisinopril-hydrochlorothiazide (PRINZIDE,ZESTORETIC) 20-12.5 MG per tablet Take 1 tablet by mouth daily.      Historical Provider, MD  meloxicam (MOBIC) 15 MG tablet Take 1 tablet (15 mg total) by mouth daily. 06/25/16  Lyndal Pulley, DO  simvastatin (ZOCOR) 20 MG tablet Take 20 mg by mouth every evening.    Historical Provider, MD  traMADol (ULTRAM) 50 MG tablet Take 1 tablet (50 mg total) by mouth at bedtime as needed. 12/15/15   Lyndal Pulley, DO    Family History Family History  Problem Relation Age of Onset  . Colon cancer Neg Hx   . Esophageal cancer Neg Hx   . Stomach cancer Neg Hx   . Rectal cancer Neg Hx     Social History Social History  Substance Use Topics  . Smoking status: Former Smoker    Quit date: 06/18/1993  . Smokeless tobacco: Never Used  . Alcohol use No     Allergies   Review of patient's allergies indicates no  known allergies.   Review of Systems Review of Systems  Constitutional: Positive for appetite change.  HENT: Negative for drooling.   Eyes: Negative for photophobia.  Respiratory: Negative for shortness of breath.   Cardiovascular: Negative for chest pain.  Gastrointestinal: Positive for blood in stool and constipation. Negative for abdominal pain and rectal pain.  Genitourinary: Negative for flank pain.  Musculoskeletal: Negative for back pain.  Skin: Positive for pallor.  Neurological: Positive for light-headedness. Negative for headaches.  Hematological: Negative for adenopathy.  Psychiatric/Behavioral: Negative for confusion.     Physical Exam Updated Vital Signs BP 101/66 (BP Location: Left Arm)   Pulse 98   Temp 97.6 F (36.4 C) (Oral)   Resp 20   Ht 5\' 4"  (1.626 m)   Wt 172 lb (78 kg)   SpO2 100%   BMI 29.52 kg/m   Physical Exam  Constitutional: She appears well-developed.  HENT:  Head: Atraumatic.  Eyes: EOM are normal.  Neck: Neck supple.  Cardiovascular: Normal rate.   Pulmonary/Chest: Effort normal.  Abdominal: Soft. There is no tenderness. There is no guarding.  Musculoskeletal: She exhibits no deformity.  Neurological: She is alert.  Skin: Skin is warm. There is pallor.  Psychiatric: She has a normal mood and affect.     ED Treatments / Results  Labs (all labs ordered are listed, but only abnormal results are displayed) Labs Reviewed  OCCULT BLOOD X 1 CARD TO LAB, STOOL - Abnormal; Notable for the following:       Result Value   Fecal Occult Bld POSITIVE (*)    All other components within normal limits  CBC WITH DIFFERENTIAL/PLATELET - Abnormal; Notable for the following:    WBC 11.4 (*)    RBC 2.46 (*)    Hemoglobin 7.3 (*)    HCT 22.1 (*)    Neutro Abs 10.2 (*)    All other components within normal limits  CBG MONITORING, ED - Abnormal; Notable for the following:    Glucose-Capillary 307 (*)    All other components within normal limits    COMPREHENSIVE METABOLIC PANEL  URINALYSIS, ROUTINE W REFLEX MICROSCOPIC (NOT AT [facility])    EKG  EKG Interpretation  Date/Time:  Tuesday July 30 2016 14:15:47 EDT Ventricular Rate:  91 PR Interval:    QRS Duration: 72 QT Interval:  347 QTC Calculation: 427 R Axis:   87 Text Interpretation:  Sinus rhythm Borderline right axis deviation Confirmed by Alvino Chapel  MD, Trevaun Rendleman [phone]) on 07/30/2016 2:38:40 PM       Radiology Dg Chest 2 View  Result Date: 07/30/2016 CLINICAL DATA:  Generalized weakness and vomiting starting this morning EXAM: CHEST  2 VIEW COMPARISON:  August 14, 2010 FINDINGS: The heart size and mediastinal contours are within normal limits. There is no focal infiltrate, pulmonary edema, or pleural effusion. Calcified granulomas of the left lung base are unchanged. Surgical clips are identified in the right axilla. The visualized skeletal structures are unremarkable. IMPRESSION: No active cardiopulmonary disease. Electronically Signed   By: Abelardo Diesel M.D.   On: 07/30/2016 15:00    Procedures Procedures (including critical care time)  Medications Ordered in ED Medications  sodium chloride 0.9 % bolus 500 mL (500 mLs Intravenous New Bag/Given 07/30/16 1435)     Initial Impression / Assessment and Plan / ED Course  I have reviewed the triage vital signs and the nursing notes.  Pertinent labs & imaging results that were available during my care of the patient were reviewed by me and considered in my medical decision making (see chart for details).  Clinical Course    Patient resents with weakness. Found to be hyperglycemic. Also has had some black stools after constipation. She is however on iron and has a history of chronic anemia.   Hemoglobin Here found to be 7. States she's been lightheaded and dizzy.  Black stool is guaiac positive. will admit to internal medicine after return of the rest of the labs.  Final Clinical Impressions(s) / ED Diagnoses    Final diagnoses:  Gastrointestinal hemorrhage, unspecified gastritis, unspecified gastrointestinal hemorrhage type  Anemia, unspecified anemia type  Hyperglycemia    New Prescriptions New Prescriptions   No medications on file     Davonna Belling, MD 07/30/16 1511

## 2016-07-30 NOTE — Progress Notes (Signed)
Called by Dr. Alvino Chapel regarding Mrs. Teresa Barnes, 70 yo F with DM, breast Ca, HTN,who presented with weakness and hyperglycemia, found to be hyperglycemic to 300 and the emergency room, however feels like she is having a GI bleed, she was found to have melena with positive fecal occult as well as a hemoglobin of 7.3. TR[facility] was asked to admit for GI bleed, per EDP patient is stable for the floor, we'll consult GI once patient arrives here, except the telemetry.  RN, on patient arrival, please call [phone] or [phone] and let patient placement RN know of the patient's arrival and a hospitalist will be assigned to admit the patient.   Javyon Fontan M. Cruzita Lederer, MD [facility] [phone]

## 2016-07-30 NOTE — ED Triage Notes (Addendum)
PT reports waking up this morning with generalized weakness, hyperglycemia, vomiting. AM CBG 378. Pt complains of constipation.

## 2016-07-31 ENCOUNTER — Encounter (HOSPITAL_COMMUNITY): Payer: Self-pay | Admitting: *Deleted

## 2016-07-31 ENCOUNTER — Inpatient Hospital Stay (HOSPITAL_COMMUNITY): Payer: Medicare Other | Admitting: Anesthesiology

## 2016-07-31 ENCOUNTER — Encounter (HOSPITAL_COMMUNITY)
Admission: EM | Disposition: A | Discharge status: Home / Self Care | Payer: Self-pay | Source: Home / Self Care | Attending: Internal Medicine

## 2016-07-31 DIAGNOSIS — H409 Unspecified glaucoma: Secondary | ICD-10-CM | POA: Diagnosis not present

## 2016-07-31 DIAGNOSIS — K2211 Ulcer of esophagus with bleeding: Secondary | ICD-10-CM | POA: Diagnosis not present

## 2016-07-31 DIAGNOSIS — Z8711 Personal history of peptic ulcer disease: Secondary | ICD-10-CM | POA: Diagnosis not present

## 2016-07-31 DIAGNOSIS — K921 Melena: Secondary | ICD-10-CM

## 2016-07-31 DIAGNOSIS — K209 Esophagitis, unspecified without bleeding: Secondary | ICD-10-CM

## 2016-07-31 DIAGNOSIS — D62 Acute posthemorrhagic anemia: Secondary | ICD-10-CM | POA: Diagnosis not present

## 2016-07-31 DIAGNOSIS — K449 Diaphragmatic hernia without obstruction or gangrene: Secondary | ICD-10-CM | POA: Diagnosis not present

## 2016-07-31 DIAGNOSIS — Z86010 Personal history of colonic polyps: Secondary | ICD-10-CM | POA: Diagnosis not present

## 2016-07-31 DIAGNOSIS — Z853 Personal history of malignant neoplasm of breast: Secondary | ICD-10-CM | POA: Diagnosis not present

## 2016-07-31 DIAGNOSIS — D649 Anemia, unspecified: Secondary | ICD-10-CM | POA: Diagnosis not present

## 2016-07-31 HISTORY — PX: ESOPHAGOGASTRODUODENOSCOPY (EGD) WITH PROPOFOL: SHX5813

## 2016-07-31 LAB — HEMOGLOBIN AND HEMATOCRIT, BLOOD
HCT: 26.7 % — ABNORMAL LOW (ref 36.0–46.0)
HEMATOCRIT: 24.7 % — AB (ref 36.0–46.0)
Hemoglobin: 9.1 g/dL — ABNORMAL LOW (ref 12.0–15.0)
Hemoglobin: 8.4 g/dL — ABNORMAL LOW (ref 12.0–15.0)

## 2016-07-31 LAB — BASIC METABOLIC PANEL
Anion gap: 7 (ref 5–15)
BUN: 65 mg/dL — AB (ref 6–20)
CHLORIDE: 105 mmol/L (ref 101–111)
CO2: 22 mmol/L (ref 22–32)
Calcium: 9.2 mg/dL (ref 8.9–10.3)
Creatinine, Ser: 1.56 mg/dL — ABNORMAL HIGH (ref 0.44–1.00)
GFR calc Af Amer: 37 mL/min — ABNORMAL LOW (ref >60)
GFR calc non Af Amer: 32 mL/min — ABNORMAL LOW (ref >60)
GLUCOSE: 108 mg/dL — AB (ref 65–99)
POTASSIUM: 4.6 mmol/L (ref 3.5–5.1)
Sodium: 134 mmol/L — ABNORMAL LOW (ref 135–145)

## 2016-07-31 LAB — GLUCOSE, CAPILLARY
GLUCOSE-CAPILLARY: 178 mg/dL — AB (ref 65–99)
Glucose-Capillary: 135 mg/dL — ABNORMAL HIGH (ref 65–99)
Glucose-Capillary: 117 mg/dL — ABNORMAL HIGH (ref 65–99)
Glucose-Capillary: 124 mg/dL — ABNORMAL HIGH (ref 65–99)

## 2016-07-31 LAB — CBC
HEMATOCRIT: 26 % — AB (ref 36.0–46.0)
Hemoglobin: 8.8 g/dL — ABNORMAL LOW (ref 12.0–15.0)
MCH: 28.7 pg (ref 26.0–34.0)
MCHC: 33.8 g/dL (ref 30.0–36.0)
MCV: 84.7 fL (ref 78.0–100.0)
Platelets: 281 10*3/uL (ref 150–400)
RBC: 3.07 MIL/uL — ABNORMAL LOW (ref 3.87–5.11)
RDW: 15.5 % (ref 11.5–15.5)
WBC: 10.1 10*3/uL (ref 4.0–10.5)

## 2016-07-31 SURGERY — ESOPHAGOGASTRODUODENOSCOPY (EGD) WITH PROPOFOL
Anesthesia: Monitor Anesthesia Care

## 2016-07-31 MED ORDER — LIDOCAINE 2% (20 MG/ML) 5 ML SYRINGE
INTRAMUSCULAR | Status: DC | PRN
  Administered 2016-07-31: 100 mg via INTRAVENOUS

## 2016-07-31 MED ORDER — PSYLLIUM 95 % PO PACK
1.0000 | PACK | Freq: Two times a day (BID) | ORAL | Status: DC
  Administered 2016-07-31 – 2016-08-01 (×2): 1 via ORAL
  Filled 2016-07-31 (×2): qty 1

## 2016-07-31 MED ORDER — LACTATED RINGERS IV SOLN
INTRAVENOUS | Status: DC
  Administered 2016-07-31: 02:00:00 via INTRAVENOUS

## 2016-07-31 MED ORDER — PROPOFOL 500 MG/50ML IV EMUL
INTRAVENOUS | Status: DC | PRN
  Administered 2016-07-31: 140 ug/kg/min via INTRAVENOUS

## 2016-07-31 MED ORDER — PHENYLEPHRINE 40 MCG/ML (10ML) SYRINGE FOR IV PUSH (FOR BLOOD PRESSURE SUPPORT)
PREFILLED_SYRINGE | INTRAVENOUS | Status: AC
  Filled 2016-07-31: qty 10

## 2016-07-31 MED ORDER — LIDOCAINE 2% (20 MG/ML) 5 ML SYRINGE
INTRAMUSCULAR | Status: AC
  Filled 2016-07-31: qty 5

## 2016-07-31 MED ORDER — SUCRALFATE 1 GM/10ML PO SUSP
1.0000 g | Freq: Four times a day (QID) | ORAL | Status: DC
  Administered 2016-07-31 – 2016-08-01 (×4): 1 g via ORAL
  Filled 2016-07-31 (×4): qty 10

## 2016-07-31 MED ORDER — PROPOFOL 10 MG/ML IV BOLUS
INTRAVENOUS | Status: AC
  Filled 2016-07-31: qty 40

## 2016-07-31 MED ORDER — PANTOPRAZOLE SODIUM 40 MG PO TBEC
40.0000 mg | DELAYED_RELEASE_TABLET | Freq: Two times a day (BID) | ORAL | Status: DC
  Administered 2016-07-31 – 2016-08-01 (×3): 40 mg via ORAL
  Filled 2016-07-31 (×3): qty 1

## 2016-07-31 SURGICAL SUPPLY — 14 items

## 2016-07-31 NOTE — Interval H&P Note (Signed)
History and Physical Interval Note:  07/31/2016 10:34 AM  Teresa Barnes  has presented today for surgery, with the diagnosis of GI bleed  The various methods of treatment have been discussed with the patient and family. After consideration of risks, benefits and other options for treatment, the patient has consented to  Procedure(s): ESOPHAGOGASTRODUODENOSCOPY (EGD) WITH PROPOFOL (N/A) as a surgical intervention .  The patient's history has been reviewed, patient examined, no change in status, stable for surgery.  I have reviewed the patient's chart and labs.  Questions were answered to the patient's satisfaction.     Renelda Loma Teresa Barnes

## 2016-07-31 NOTE — H&P (View-Only) (Signed)
Consultation  Referring Provider: [facility] hospitalist/Dr Doyle Askew Primary Care Physician:  Foye Spurling, MD Primary Gastroenterologist:  Dr.Perry  Reason for Consultation:  GI  Bleed   HPI: Teresa Barnes is a 70 y.o. female with history of diabetes mellitus, hypertension, glaucoma, and remote breast cancer. She is known to Dr. Scarlette Shorts from prior colonoscopies. She has history of adenomatous colon polyps. She underwent last colonoscopy in July 2015 which was a normal exam. Patient has not had any prior history of GI bleeding. She presented to the hospital last evening after onset yesterday morning with weakness and dizziness. She did have an elevated blood sugar in the 360 range but symptoms persisted after correction of her blood sugar. She had not had a bowel movement for about 7 days which was quite unusual for her and when she did go yesterday she passed black tarry stool with 2 separate bowel movements. She also vomited at home 2 with dark blackish material. She denies any recent heartburn indigestion dysphagia or 9 aphasia. She has not been having any abdominal pain or nausea no recent changes in bowel habits other than this one episode. Does take an iron pill chronically for anemia. Her baseline hemoglobin is in the 9 range. Patient not been on PPI. She does take Mobitz on a daily basis for arthritic symptoms no other aspirin or OTC NSAIDs. In Evaluation in the emergency room, patient was hemodynamically stable but hemoglobin down to 7.3. Patient had brought a sample of her stool to the hospital which was melenic and heme positive. On Patient has been stable overnight, she's received 2 units of packed RBCs and hemoglobin is 8.8 today.   Past Medical History:  Diagnosis Date  . Cancer ([place]) 1991   breast  . Diabetes mellitus without complication ([place])   . Glaucoma   . Hypertension   . Wears glasses     Past Surgical History:  Procedure Laterality Date  . COLONOSCOPY    . EYE  SURGERY  2011   lt eye blled  . MASTECTOMY Right 21 years ago  . TRIGGER FINGER RELEASE Right 06/24/2013   Procedure: RELEASE TRIGGER FINGER/A-1 PULLEY PIP RIGHT LONG FINGER;  Surgeon: Cammie Sickle., MD;  Location: [facility];  Service: Orthopedics;  Laterality: Right;  . TUBAL LIGATION      Prior to Admission medications   Medication Sig Start Date End Date Taking? Authorizing Provider  allopurinol (ZYLOPRIM) 100 MG tablet Take 100 mg by mouth daily.  10/26/13  Yes Historical Provider, MD  amLODipine (NORVASC) 10 MG tablet Take 10 mg by mouth daily.  07/16/16  Yes Historical Provider, MD  bimatoprost (LUMIGAN) 0.03 % ophthalmic solution Place 1 drop into both eyes at bedtime.    Yes Historical Provider, MD  folic acid (FOLVITE) 1 MG tablet Take 1 mg by mouth daily.   Yes Historical Provider, MD  glipiZIDE (GLUCOTROL) 5 MG tablet Take 5 mg by mouth daily as needed (high blood sugar).    Yes Historical Provider, MD  hydrochlorothiazide (HYDRODIURIL) 12.5 MG tablet Take 12.5 mg by mouth daily.  07/16/16  Yes Historical Provider, MD  lovastatin (MEVACOR) 20 MG tablet Take 20 mg by mouth at bedtime.  03/04/16  Yes Historical Provider, MD  metFORMIN (GLUCOPHAGE) 1000 MG tablet Take 1,000 mg by mouth daily with breakfast.    Yes Historical Provider, MD  NOVOFINE PLUS 32G X 4 MM MISC  01/25/16  Yes Historical Provider, MD  Propylene Glycol (SYSTANE  BALANCE OP) Place 1 drop into both eyes 3 (three) times daily as needed (dry eyes).   Yes Historical Provider, MD  psyllium (REGULOID) 0.52 g capsule Take 0.52 g by mouth daily as needed (constipation).   Yes Historical Provider, MD  VICTOZA 18 MG/3ML SOPN Inject 18 mg into the skin daily.  04/02/16  Yes Historical Provider, MD  vitamin B-12 (CYANOCOBALAMIN) 250 MCG tablet Take 500 mcg by mouth daily.   Yes Historical Provider, MD    Current Facility-Administered Medications  Medication Dose Route Frequency Provider Last Rate Last Dose  .  acetaminophen (TYLENOL) tablet 650 mg  650 mg Oral Q6H PRN Karmen Bongo, MD   650 mg at 07/30/16 2118   Or  . acetaminophen (TYLENOL) suppository 650 mg  650 mg Rectal Q6H PRN Karmen Bongo, MD      . allopurinol (ZYLOPRIM) tablet 100 mg  100 mg Oral Daily Karmen Bongo, MD   100 mg at 07/30/16 2211  . amLODipine (NORVASC) tablet 10 mg  10 mg Oral Daily Karmen Bongo, MD   Stopped at 07/30/16 2210  . folic acid (FOLVITE) tablet 1 mg  1 mg Oral Daily Karmen Bongo, MD   1 mg at 07/30/16 2211  . Influenza vac split quadrivalent PF (FLUARIX) injection 0.5 mL  0.5 mL Intramuscular Tomorrow-1000 Karmen Bongo, MD      . insulin aspart (novoLOG) injection 0-15 Units  0-15 Units Subcutaneous TID WC Karmen Bongo, MD      . lactated ringers infusion   Intravenous Continuous Karmen Bongo, MD 100 mL/hr at 07/31/16 0218    . latanoprost (XALATAN) 0.005 % ophthalmic solution 1 drop  1 drop Both Eyes QHS Karmen Bongo, MD   1 drop at 07/30/16 2119  . ondansetron (ZOFRAN) tablet 4 mg  4 mg Oral Q6H PRN Karmen Bongo, MD       Or  . ondansetron [facility]) injection 4 mg  4 mg Intravenous Q6H PRN Karmen Bongo, MD      . pantoprazole (PROTONIX) injection 40 mg  40 mg Intravenous Q12H Karmen Bongo, MD   40 mg at 07/30/16 2118  . polyvinyl alcohol (LIQUIFILM TEARS) 1.4 % ophthalmic solution 1 drop  1 drop Both Eyes TID PRN Karmen Bongo, MD   1 drop at 07/30/16 2120  . pravastatin (PRAVACHOL) tablet 20 mg  20 mg Oral q1800 Karmen Bongo, MD   Stopped at 07/30/16 2212    Allergies as of 07/30/2016  . (No Known Allergies)    Family History  Problem Relation Age of Onset  . Colon cancer Neg Hx   . Esophageal cancer Neg Hx   . Stomach cancer Neg Hx   . Rectal cancer Neg Hx     Social History   Social History  . Marital status: Married    Spouse name: N/A  . Number of children: N/A  . Years of education: N/A   Occupational History  . retired    Social History Main Topics  . Smoking  status: Former Smoker    Quit date: 06/18/1993  . Smokeless tobacco: Never Used  . Alcohol use No  . Drug use: No  . Sexual activity: Not on file   Other Topics Concern  . Not on file   Social History Narrative  . No narrative on file    Review of Systems: Pertinent positive and negative review of systems were noted in the above HPI section.  All other review of systems was otherwise negative.  Physical Exam:  Vital signs in last 24 hours: Temp:  [97.6 F (36.4 C)-99 F (37.2 C)] 98.7 F (37.1 C) (09/13 0542) Pulse Rate:  [77-108] 77 (09/13 0542) Resp:  [18-23] 20 (09/13 0542) BP: (101-128)/(34-66) 120/34 (09/13 0542) SpO2:  [98 %-100 %] 100 % (09/13 0542) Weight:  [172 lb (78 kg)] 172 lb (78 kg) (09/12 1700) Last BM Date: 07/30/16 General:   Alert,  Well-developed, well-nourished, pleasant and cooperative in NAD,family at bedside Head:  Normocephalic and atraumatic. Eyes:  Sclera clear, no icterus.   Conjunctiva pink. Ears:  Normal auditory acuity. Nose:  No deformity, discharge,  or lesions. Mouth:  No deformity or lesions.   Neck:  Supple; no masses or thyromegaly. Lungs:  Clear throughout to auscultation.   No wheezes, crackles, or rhonchi. Prior right breast surgery  Heart:  Regular rate and rhythm; no murmurs, clicks, rubs,  or gallops. Abdomen:  Soft,nontender, BS active,nonpalp mass or hsm.   Rectal:  Deferred  Msk:  Symmetrical without gross deformities. . Pulses:  Normal pulses noted. Extremities:  Without clubbing or edema. Neurologic:  Alert and  oriented x4;  grossly normal neurologically. Skin:  Intact without significant lesions or rashes.. Psych:  Alert and cooperative. Normal mood and affect.  Intake/Output from previous day: 09/12 0701 - 09/13 0700 In: 772 [I.V.:170; Blood:602] Out: -  Intake/Output this shift: No intake/output data recorded.  Lab Results:  Recent Labs  07/30/16 1425 07/31/16 0529  WBC 11.4* 10.1  HGB 7.3* 8.8*  HCT 22.1*  26.0*  PLT 396 281   BMET  Recent Labs  07/30/16 1425 07/31/16 0529  NA 133* 134*  K 4.7 4.6  CL 101 105  CO2 19* 22  GLUCOSE 285* 108*  BUN 68* 65*  CREATININE 1.58* 1.56*  CALCIUM 9.8 9.2   LFT  Recent Labs  07/30/16 1425  PROT 6.8  ALBUMIN 3.7  AST 18  ALT 14  ALKPHOS 46  BILITOT 0.6   PT/INR No results for input(s): LABPROT, INR in the last 72 hours. Hepatitis Panel No results for input(s): HEPBSAG, HCVAB, HEPAIGM, HEPBIGM in the last 72 hours.    IMPRESSION:  #72 69 year old African-American female presenting with acute upper GI bleed with melena and coffee-ground emesis onset yesterday. This is in the setting of chronic prescription NSAID use. Suspect NSAID-induced gastropathy/peptic ulcer disease #2 acute on chronic anemia #3 history of adenomatous colon polyps, last colonoscopy July 2015 normal exam #4 diabetes mellitus #5 hypertension # 6 glaucoma    PLAN: Keep patient nothing by mouth this morning Continue IV Protonix 40 twice a day Serial hemoglobins every 8 hours and transfuse for hemoglobin 7.5 or less Stop meloxicam and all NSAIDs Patient is scheduled for EGD later this morning with Dr. Havery Moros. Procedure was discussed in detail with the patient and her family including risks and benefits and she is agreeable to proceed. Thank you for the consult we will follow with you   Josafat Enrico [facility]  07/31/2016, 8:49 AM

## 2016-07-31 NOTE — Progress Notes (Signed)
Patient ID: Teresa Barnes, female   DOB: [birth year], 70 y.o.   MRN: [id number]    PROGRESS NOTE    Teresa Barnes  QVZ:[id number] DOB: November 04, 1946 DOA: 07/30/2016  PCP: Foye Spurling, MD   Brief Narrative:  70 y.o.femalewith medical history significant of remote breast cancer, DM, HTN, and glaucoma, presented for evaluation dizziness and dark, tarry colored stools.   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 - secondary to the above - review of records indicated GFT in 50's about three years ago with baseline Cr 1.2  - allow clear liquids - BMP in AM    Hyponatremia - mild, pre renal - BMP in AM    Leukocytosis - appears to be reactive - no signs of an infectious etiology - CBC in AM    Essential HTN - continue home medical regimen     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   Objective: Vitals:   07/31/16 0855 07/31/16 1027 07/31/16 1140 07/31/16 1243  BP: (!) 121/45 (!) 129/43  (!) 117/43  Pulse: 79 75  66  Resp:  (!) 23  20  Temp:  98.3 F (36.8 C)  97.6 F (36.4 C)  TempSrc:  Oral  Oral  SpO2:  98% 100% 99%  Weight:  78 kg (172 lb)    Height:  5\' 4"  (1.626 m)      Intake/Output Summary (Last 24 hours) at 07/31/16 1258 Last data filed at 07/31/16 1138  Gross per 24 hour    Intake             1622 ml  Output                0 ml  Net             1622 ml   Filed Weights   07/30/16 1346 07/30/16 1700 07/31/16 1027  Weight: 78 kg (172 lb) 78 kg (172 lb) 78 kg (172 lb)    Examination:  General exam: Appears calm and comfortable  Respiratory system: Clear to auscultation. Respiratory effort normal. Cardiovascular system: S1 & S2 heard, RRR. No pedal edema. Gastrointestinal system: Abdomen is nondistended, soft and nontender. No organomegaly or masses felt. Central nervous system: Alert and oriented. No focal neurological deficits. Extremities: Symmetric 5 x 5 power. Skin: No rashes, lesions or ulcers Psychiatry: Judgement and insight appear normal. Mood & affect appropriate.   Data Reviewed: I have personally reviewed following labs and imaging studies  CBC:  Recent Labs Lab 07/30/16 1425 07/31/16 0529  WBC 11.4* 10.1  NEUTROABS 10.2*  --   HGB 7.3* 8.8*  HCT 22.1* 26.0*  MCV 89.8 84.7  PLT 396 329   Basic Metabolic Panel:  Recent Labs Lab 07/30/16 1425 07/31/16 0529  NA 133* 134*  K 4.7 4.6  CL 101 105  CO2 19* 22  GLUCOSE 285* 108*  BUN 68* 65*  CREATININE 1.58* 1.56*  CALCIUM 9.8 9.2   GFR: Estimated Creatinine Clearance: 33.4 mL/min (by C-G formula based on SCr of 1.56 mg/dL (H)). Liver Function Tests:  Recent Labs Lab 07/30/16 1425  AST 18  ALT 14  ALKPHOS 46  BILITOT 0.6  PROT 6.8  ALBUMIN 3.7   CBG:  Recent Labs Lab 07/30/16 1340 07/30/16 1753 07/30/16 2144 07/31/16 0735  GLUCAP 307* 163* 173* 135*   Radiology Studies: Dg Chest 2 View  Result Date: 07/30/2016 CLINICAL DATA:  Generalized weakness and vomiting starting this morning EXAM: CHEST  2 VIEW COMPARISON:  August 14, 2010 FINDINGS: The heart size and mediastinal contours are within normal limits. There is no focal infiltrate, pulmonary edema, or pleural effusion. Calcified granulomas of the left lung base are unchanged. Surgical clips are  identified in the right axilla. The visualized skeletal structures are unremarkable. IMPRESSION: No active cardiopulmonary disease. Electronically Signed   By: Abelardo Diesel M.D.   On: 07/30/2016 15:00      Scheduled Meds: . allopurinol  100 mg Oral Daily  . amLODipine  10 mg Oral Daily  . folic acid  1 mg Oral Daily  . Influenza vac split quadrivalent PF  0.5 mL Intramuscular Tomorrow-1000  . insulin aspart  0-15 Units Subcutaneous TID WC  . latanoprost  1 drop Both Eyes QHS  . pantoprazole  40 mg Oral BID  . pravastatin  20 mg Oral q1800  . sucralfate  1 g Oral Q6H   Continuous Infusions: . lactated ringers 100 mL/hr at 07/31/16 0218     LOS: 1 day    Time spent: 20 minutes    Faye Ramsay, MD [facility] Pager [phone]  If 7PM-7AM, please contact night-coverage [email or web address] Password [facility] 07/31/2016, 12:58 PM

## 2016-07-31 NOTE — Op Note (Signed)
[facility] Patient Name: Teresa Barnes Procedure Date: 07/31/2016 MRN: [id number] Attending MD: Carlota Raspberry. Havery Moros , MD Date of Birth: Mar 25, 1946 CSN: [id number] Age: 70 Admit Type: Inpatient Procedure:                Upper GI endoscopy Indications:              Suspected upper gastrointestinal bleeding Providers:                Remo Lipps P. Havery Moros, MD, Cleda Daub, RN, Despina Pole Tech, Technician, Danley Danker, CRNA Referring MD:              Medicines:                Monitored Anesthesia Care Complications:            No immediate complications. Estimated blood loss:                            Minimal. Estimated Blood Loss:     Estimated blood loss was minimal. Procedure:                Pre-Anesthesia Assessment:                           - Prior to the procedure, a History and Physical                            was performed, and patient medications and                            allergies were reviewed. The patient's tolerance of                            previous anesthesia was also reviewed. The risks                            and benefits of the procedure and the sedation                            options and risks were discussed with the patient.                            All questions were answered, and informed consent                            was obtained. Prior Anticoagulants: The patient has                            taken no previous anticoagulant or antiplatelet                            agents. ASA Grade Assessment: III - A patient with  severe systemic disease. After reviewing the risks                            and benefits, the patient was deemed in                            satisfactory condition to undergo the procedure.                           After obtaining informed consent, the endoscope was                            passed under direct vision. Throughout the                   procedure, the patient's blood pressure, pulse, and                            oxygen saturations were monitored continuously. The                            [id number] [id number]) scope was introduced through the                            mouth, and advanced to the second part of duodenum.                            The upper GI endoscopy was accomplished without                            difficulty. The patient tolerated the procedure                            well. Scope In: Scope Out: Findings:      Esophagogastric landmarks were identified: the Z-line was found at 33       cm, the gastroesophageal junction was found at 33 cm and the upper       extent of the gastric folds was found at 40 cm from the incisors.      A 7 cm hiatal hernia was present.      Severe esophagitis was found in the distal esophagus with large areas of       ulceration. No high risk stigmata of bleeding were noted on initial       inspection, however the patient wretched multiple times during the case       which caused some endoscopic irritation to the area, with mild oozing,       but no high risk stigmata for bleeding noted. It is possible there is       underlying Barrett's esophagus but unable to evaluate for this given       active inflammation.      The exam of the esophagus was otherwise normal.      The entire examined stomach was normal other than large hernia. The       patient had a hard time retaining air due to hiatal hernia.      The duodenal bulb and second portion of  the duodenum were normal. Impression:               - Esophagogastric landmarks identified.                           - 7 cm hiatal hernia.                           - Severe esophagitis which is the likely cause of                            the patient's recent symptoms.                           - Normal stomach.                           - Normal duodenal bulb and second portion of the                             duodenum. Moderate Sedation:      No moderate sedation, case performed with MAC Recommendation:           - Return patient to hospital ward for ongoing care.                           - Clear liquid diet, advance diet tomorrow as                            tolerated                           - Protonix 40mg  twice daily x 1 month, then 40mg                             daily                           - Liquid carafate - 10cc po q 6 hours for 1 week                           - If patient is tolerating liquids and is stable                            later today without any vomiting, can consider                            discharge later today                           - Repeat upper endoscopy as an outpatient in 3                            months for screening for Barrett's esophagus. Procedure Code(s):        --- Professional ---  41583, Esophagogastroduodenoscopy, flexible,                            transoral; diagnostic, including collection of                            specimen(s) by brushing or washing, when performed                            (separate procedure) Diagnosis Code(s):        --- Professional ---                           K44.9, Diaphragmatic hernia without obstruction or                            gangrene                           K20.9, Esophagitis, unspecified CPT copyright 2016 American Medical Association. All rights reserved. The codes documented in this report are preliminary and upon coder review may  be revised to meet current compliance requirements. Remo Lipps P. [name], MD 07/31/2016 11:46:26 AM This report has been signed electronically. Number of Addenda: 0

## 2016-07-31 NOTE — Anesthesia Postprocedure Evaluation (Signed)
Anesthesia Post Note  Patient: Teresa Barnes  Procedure(s) Performed: Procedure(s) (LRB): ESOPHAGOGASTRODUODENOSCOPY (EGD) WITH PROPOFOL (N/A)  Patient location during evaluation: Endoscopy Anesthesia Type: MAC Level of consciousness: awake Pain management: pain level controlled Vital Signs Assessment: post-procedure vital signs reviewed and stable Respiratory status: spontaneous breathing Cardiovascular status: stable Postop Assessment: no signs of nausea or vomiting Anesthetic complications: no    Last Vitals:  Vitals:   07/31/16 1027 07/31/16 1243  BP: (!) 129/43 (!) 117/43  Pulse: 75 66  Resp: (!) 23 20  Temp: 36.8 C 36.4 C    Last Pain:  Vitals:   07/31/16 1243  TempSrc: Oral  PainSc:                  Dalma Panchal

## 2016-07-31 NOTE — Anesthesia Preprocedure Evaluation (Signed)
Anesthesia Evaluation  Patient identified by MRN, date of birth, ID band Patient awake    Reviewed: Allergy & Precautions, NPO status , Patient's Chart, lab work & pertinent test results  History of Anesthesia Complications Negative for: history of anesthetic complications  Airway Mallampati: II  TM Distance: >3 FB Neck ROM: Full    Dental  (+) Teeth Intact   Pulmonary neg pulmonary ROS, former smoker,    breath sounds clear to auscultation       Cardiovascular hypertension, Pt. on medications (-) angina(-) Past MI and (-) CHF (-) dysrhythmias  Rhythm:Regular     Neuro/Psych  Neuromuscular disease negative psych ROS   GI/Hepatic negative GI ROS, Neg liver ROS,   Endo/Other  diabetes, Type 2, Oral Hypoglycemic Agents  Renal/GU negative Renal ROS     Musculoskeletal  (+) Arthritis ,   Abdominal   Peds  Hematology  (+) anemia ,   Anesthesia Other Findings   Reproductive/Obstetrics                             Anesthesia Physical Anesthesia Plan  ASA: II  Anesthesia Plan: MAC   Post-op Pain Management:    Induction: Intravenous  Airway Management Planned: Natural Airway, Nasal Cannula and Simple Face Mask  Additional Equipment: None  Intra-op Plan:   Post-operative Plan:   Informed Consent: I have reviewed the patients History and Physical, chart, labs and discussed the procedure including the risks, benefits and alternatives for the proposed anesthesia with the patient or authorized representative who has indicated his/her understanding and acceptance.   Dental advisory given  Plan Discussed with: CRNA and Surgeon  Anesthesia Plan Comments:         Anesthesia Quick Evaluation

## 2016-07-31 NOTE — Consult Note (Signed)
Consultation  Referring Provider: [facility] hospitalist/Dr Doyle Askew Primary Care Physician:  Foye Spurling, MD Primary Gastroenterologist:  Dr.Perry  Reason for Consultation:  GI  Bleed   HPI: Teresa Barnes is a 70 y.o. female with history of diabetes mellitus, hypertension, glaucoma, and remote breast cancer. She is known to Dr. Scarlette Shorts from prior colonoscopies. She has history of adenomatous colon polyps. She underwent last colonoscopy in July 2015 which was a normal exam. Patient has not had any prior history of GI bleeding. She presented to the hospital last evening after onset yesterday morning with weakness and dizziness. She did have an elevated blood sugar in the 360 range but symptoms persisted after correction of her blood sugar. She had not had a bowel movement for about 7 days which was quite unusual for her and when she did go yesterday she passed black tarry stool with 2 separate bowel movements. She also vomited at home 2 with dark blackish material. She denies any recent heartburn indigestion dysphagia or 9 aphasia. She has not been having any abdominal pain or nausea no recent changes in bowel habits other than this one episode. Does take an iron pill chronically for anemia. Her baseline hemoglobin is in the 9 range. Patient not been on PPI. She does take Mobitz on a daily basis for arthritic symptoms no other aspirin or OTC NSAIDs. In Evaluation in the emergency room, patient was hemodynamically stable but hemoglobin down to 7.3. Patient had brought a sample of her stool to the hospital which was melenic and heme positive. On Patient has been stable overnight, she's received 2 units of packed RBCs and hemoglobin is 8.8 today.   Past Medical History:  Diagnosis Date  . Cancer ([place]) 1991   breast  . Diabetes mellitus without complication ([place])   . Glaucoma   . Hypertension   . Wears glasses     Past Surgical History:  Procedure Laterality Date  . COLONOSCOPY    . EYE  SURGERY  2011   lt eye blled  . MASTECTOMY Right 21 years ago  . TRIGGER FINGER RELEASE Right 06/24/2013   Procedure: RELEASE TRIGGER FINGER/A-1 PULLEY PIP RIGHT LONG FINGER;  Surgeon: Cammie Sickle., MD;  Location: [facility];  Service: Orthopedics;  Laterality: Right;  . TUBAL LIGATION      Prior to Admission medications   Medication Sig Start Date End Date Taking? Authorizing Provider  allopurinol (ZYLOPRIM) 100 MG tablet Take 100 mg by mouth daily.  10/26/13  Yes Historical Provider, MD  amLODipine (NORVASC) 10 MG tablet Take 10 mg by mouth daily.  07/16/16  Yes Historical Provider, MD  bimatoprost (LUMIGAN) 0.03 % ophthalmic solution Place 1 drop into both eyes at bedtime.    Yes Historical Provider, MD  folic acid (FOLVITE) 1 MG tablet Take 1 mg by mouth daily.   Yes Historical Provider, MD  glipiZIDE (GLUCOTROL) 5 MG tablet Take 5 mg by mouth daily as needed (high blood sugar).    Yes Historical Provider, MD  hydrochlorothiazide (HYDRODIURIL) 12.5 MG tablet Take 12.5 mg by mouth daily.  07/16/16  Yes Historical Provider, MD  lovastatin (MEVACOR) 20 MG tablet Take 20 mg by mouth at bedtime.  03/04/16  Yes Historical Provider, MD  metFORMIN (GLUCOPHAGE) 1000 MG tablet Take 1,000 mg by mouth daily with breakfast.    Yes Historical Provider, MD  NOVOFINE PLUS 32G X 4 MM MISC  01/25/16  Yes Historical Provider, MD  Propylene Glycol (SYSTANE  BALANCE OP) Place 1 drop into both eyes 3 (three) times daily as needed (dry eyes).   Yes Historical Provider, MD  psyllium (REGULOID) 0.52 g capsule Take 0.52 g by mouth daily as needed (constipation).   Yes Historical Provider, MD  VICTOZA 18 MG/3ML SOPN Inject 18 mg into the skin daily.  04/02/16  Yes Historical Provider, MD  vitamin B-12 (CYANOCOBALAMIN) 250 MCG tablet Take 500 mcg by mouth daily.   Yes Historical Provider, MD    Current Facility-Administered Medications  Medication Dose Route Frequency Provider Last Rate Last Dose  .  acetaminophen (TYLENOL) tablet 650 mg  650 mg Oral Q6H PRN Karmen Bongo, MD   650 mg at 07/30/16 2118   Or  . acetaminophen (TYLENOL) suppository 650 mg  650 mg Rectal Q6H PRN Karmen Bongo, MD      . allopurinol (ZYLOPRIM) tablet 100 mg  100 mg Oral Daily Karmen Bongo, MD   100 mg at 07/30/16 2211  . amLODipine (NORVASC) tablet 10 mg  10 mg Oral Daily Karmen Bongo, MD   Stopped at 07/30/16 2210  . folic acid (FOLVITE) tablet 1 mg  1 mg Oral Daily Karmen Bongo, MD   1 mg at 07/30/16 2211  . Influenza vac split quadrivalent PF (FLUARIX) injection 0.5 mL  0.5 mL Intramuscular Tomorrow-1000 Karmen Bongo, MD      . insulin aspart (novoLOG) injection 0-15 Units  0-15 Units Subcutaneous TID WC Karmen Bongo, MD      . lactated ringers infusion   Intravenous Continuous Karmen Bongo, MD 100 mL/hr at 07/31/16 0218    . latanoprost (XALATAN) 0.005 % ophthalmic solution 1 drop  1 drop Both Eyes QHS Karmen Bongo, MD   1 drop at 07/30/16 2119  . ondansetron (ZOFRAN) tablet 4 mg  4 mg Oral Q6H PRN Karmen Bongo, MD       Or  . ondansetron [facility]) injection 4 mg  4 mg Intravenous Q6H PRN Karmen Bongo, MD      . pantoprazole (PROTONIX) injection 40 mg  40 mg Intravenous Q12H Karmen Bongo, MD   40 mg at 07/30/16 2118  . polyvinyl alcohol (LIQUIFILM TEARS) 1.4 % ophthalmic solution 1 drop  1 drop Both Eyes TID PRN Karmen Bongo, MD   1 drop at 07/30/16 2120  . pravastatin (PRAVACHOL) tablet 20 mg  20 mg Oral q1800 Karmen Bongo, MD   Stopped at 07/30/16 2212    Allergies as of 07/30/2016  . (No Known Allergies)    Family History  Problem Relation Age of Onset  . Colon cancer Neg Hx   . Esophageal cancer Neg Hx   . Stomach cancer Neg Hx   . Rectal cancer Neg Hx     Social History   Social History  . Marital status: Married    Spouse name: N/A  . Number of children: N/A  . Years of education: N/A   Occupational History  . retired    Social History Main Topics  . Smoking  status: Former Smoker    Quit date: 06/18/1993  . Smokeless tobacco: Never Used  . Alcohol use No  . Drug use: No  . Sexual activity: Not on file   Other Topics Concern  . Not on file   Social History Narrative  . No narrative on file    Review of Systems: Pertinent positive and negative review of systems were noted in the above HPI section.  All other review of systems was otherwise negative.  Physical Exam:  Vital signs in last 24 hours: Temp:  [97.6 F (36.4 C)-99 F (37.2 C)] 98.7 F (37.1 C) (09/13 0542) Pulse Rate:  [77-108] 77 (09/13 0542) Resp:  [18-23] 20 (09/13 0542) BP: (101-128)/(34-66) 120/34 (09/13 0542) SpO2:  [98 %-100 %] 100 % (09/13 0542) Weight:  [172 lb (78 kg)] 172 lb (78 kg) (09/12 1700) Last BM Date: 07/30/16 General:   Alert,  Well-developed, well-nourished, pleasant and cooperative in NAD,family at bedside Head:  Normocephalic and atraumatic. Eyes:  Sclera clear, no icterus.   Conjunctiva pink. Ears:  Normal auditory acuity. Nose:  No deformity, discharge,  or lesions. Mouth:  No deformity or lesions.   Neck:  Supple; no masses or thyromegaly. Lungs:  Clear throughout to auscultation.   No wheezes, crackles, or rhonchi. Prior right breast surgery  Heart:  Regular rate and rhythm; no murmurs, clicks, rubs,  or gallops. Abdomen:  Soft,nontender, BS active,nonpalp mass or hsm.   Rectal:  Deferred  Msk:  Symmetrical without gross deformities. . Pulses:  Normal pulses noted. Extremities:  Without clubbing or edema. Neurologic:  Alert and  oriented x4;  grossly normal neurologically. Skin:  Intact without significant lesions or rashes.. Psych:  Alert and cooperative. Normal mood and affect.  Intake/Output from previous day: 09/12 0701 - 09/13 0700 In: 772 [I.V.:170; Blood:602] Out: -  Intake/Output this shift: No intake/output data recorded.  Lab Results:  Recent Labs  07/30/16 1425 07/31/16 0529  WBC 11.4* 10.1  HGB 7.3* 8.8*  HCT 22.1*  26.0*  PLT 396 281   BMET  Recent Labs  07/30/16 1425 07/31/16 0529  NA 133* 134*  K 4.7 4.6  CL 101 105  CO2 19* 22  GLUCOSE 285* 108*  BUN 68* 65*  CREATININE 1.58* 1.56*  CALCIUM 9.8 9.2   LFT  Recent Labs  07/30/16 1425  PROT 6.8  ALBUMIN 3.7  AST 18  ALT 14  ALKPHOS 46  BILITOT 0.6   PT/INR No results for input(s): LABPROT, INR in the last 72 hours. Hepatitis Panel No results for input(s): HEPBSAG, HCVAB, HEPAIGM, HEPBIGM in the last 72 hours.    IMPRESSION:  #42 70 year old African-American female presenting with acute upper GI bleed with melena and coffee-ground emesis onset yesterday. This is in the setting of chronic prescription NSAID use. Suspect NSAID-induced gastropathy/peptic ulcer disease #2 acute on chronic anemia #3 history of adenomatous colon polyps, last colonoscopy July 2015 normal exam #4 diabetes mellitus #5 hypertension # 6 glaucoma    PLAN: Keep patient nothing by mouth this morning Continue IV Protonix 40 twice a day Serial hemoglobins every 8 hours and transfuse for hemoglobin 7.5 or less Stop meloxicam and all NSAIDs Patient is scheduled for EGD later this morning with Dr. Havery Moros. Procedure was discussed in detail with the patient and her family including risks and benefits and she is agreeable to proceed. Thank you for the consult we will follow with you   Burrel Legrand [facility]  07/31/2016, 8:49 AM

## 2016-07-31 NOTE — Progress Notes (Deleted)
[name] [address] [place], [place] [address] Phone: [phone] Subjective:    I'm seeing this patient by the request  of:  Foye Spurling, MD  CC:  Neck pain f/u   DUK:[id number]  Teresa Barnes is a 70 y.o. female coming in with complaint of  Right-sided neck pain. Son has significant tightness and trigger point in the right side of the neck. Patient elected try conservative therapy. Given medications. No side effects. Mild improvement when she is taking the medicines. Still has significant tightness on the right side. Still affecting some daily activities. Difficult to get comfortable at night.     Past Medical History:  Diagnosis Date  . Cancer ([place]) 1991   breast  . Diabetes mellitus without complication ([place])   . Glaucoma   . Hypertension   . Wears glasses    Past Surgical History:  Procedure Laterality Date  . COLONOSCOPY    . EYE SURGERY  2011   lt eye blled  . MASTECTOMY Right 21 years ago  . TRIGGER FINGER RELEASE Right 06/24/2013   Procedure: RELEASE TRIGGER FINGER/A-1 PULLEY PIP RIGHT LONG FINGER;  Surgeon: Cammie Sickle., MD;  Location: [facility];  Service: Orthopedics;  Laterality: Right;  . TUBAL LIGATION     Social History   Social History  . Marital status: Married    Spouse name: N/A  . Number of children: N/A  . Years of education: N/A   Occupational History  . retired    Social History Main Topics  . Smoking status: Former Smoker    Quit date: 06/18/1993  . Smokeless tobacco: Never Used  . Alcohol use No  . Drug use: No  . Sexual activity: Not on file   Other Topics Concern  . Not on file   Social History Narrative  . No narrative on file   No Known Allergies Family History  Problem Relation Age of Onset  . Colon cancer Neg Hx   . Esophageal cancer Neg Hx   . Stomach cancer Neg Hx   . Rectal cancer Neg Hx     Past medical history, social, surgical and family history all  reviewed in electronic medical record.  No pertanent information unless stated regarding to the chief complaint.   Review of Systems: No headache, visual changes, nausea, vomiting, diarrhea, constipation, dizziness, abdominal pain, skin rash, fevers, chills, night sweats, weight loss, swollen lymph nodes, body aches, joint swelling, muscle aches, chest pain, shortness of breath, mood changes.   Objective  There were no vitals taken for this visit.  General: No apparent distress alert and oriented x3 mood and affect normal, dressed appropriately.  HEENT: Pupils equal, extraocular movements intact  Respiratory: Patient's speak in full sentences and does not appear short of breath  Cardiovascular: No lower extremity edema, non tender, no erythema  Skin: Warm dry intact with no signs of infection or rash on extremities or on axial skeleton.  Abdomen: Soft nontender  Neuro: Cranial nerves II through XII are intact, neurovascularly intact in all extremities with 2+ DTRs and 2+ pulses.  Lymph: No lymphadenopathy of posterior or anterior cervical chain or axillae bilaterally.  Gait normal with good balance and coordination.  MSK:  Non tender with full range of motion and good stability and symmetric strength and tone of elbows, wrist, hip, and ankles bilaterally.  Neck: Inspection unremarkable.patient does have some tightness in the right trapezius No palpable stepoffs. Negative Spurling's maneuver.  Lacks last 5 of side bending and rotation to the right. Grip strength and sensation normal in bilateral hands Strength good C4 to T1 distribution No sensory change to C4 to T1 Negative Hoffman sign bilaterally Reflexes normal Significant tightness in the right trapezius muscle. 4 trigger points noted Mild worsening from previous exam.   Verbal consent patient was prepped with alcohol swab and with a 25-gauge half-inch needle was injected in for specific trigger points in the right trapezius muscle.  Total of 3 mL of 0.5% Marcaine and 1 mL of Kenalog 40 mg/dL used.     Impression and Recommendations:     This case required medical decision making of moderate complexity.

## 2016-07-31 NOTE — Transfer of Care (Signed)
Immediate Anesthesia Transfer of Care Note  Patient: Teresa Barnes  Procedure(s) Performed: Procedure(s): ESOPHAGOGASTRODUODENOSCOPY (EGD) WITH PROPOFOL (N/A)  Patient Location: Endoscopy Unit  Anesthesia Type:MAC  Level of Consciousness: awake and alert   Airway & Oxygen Therapy: Patient Spontanous Breathing and Patient connected to face mask oxygen  Post-op Assessment: Report given to RN and Post -op Vital signs reviewed and stable  Post vital signs: Reviewed and stable  Last Vitals:  Vitals:   07/31/16 0855 07/31/16 1027  BP: (!) 121/45 (!) 129/43  Pulse: 79 75  Resp:  (!) 23  Temp:  36.8 C    Last Pain:  Vitals:   07/31/16 1027  TempSrc: Oral  PainSc:          Complications: No apparent anesthesia complications

## 2016-08-01 ENCOUNTER — Ambulatory Visit: Payer: Medicare Other | Admitting: Family Medicine

## 2016-08-01 ENCOUNTER — Encounter (HOSPITAL_COMMUNITY): Payer: Self-pay | Admitting: Gastroenterology

## 2016-08-01 DIAGNOSIS — Z23 Encounter for immunization: Secondary | ICD-10-CM | POA: Diagnosis not present

## 2016-08-01 DIAGNOSIS — E871 Hypo-osmolality and hyponatremia: Secondary | ICD-10-CM | POA: Diagnosis not present

## 2016-08-01 DIAGNOSIS — N182 Chronic kidney disease, stage 2 (mild): Secondary | ICD-10-CM | POA: Diagnosis not present

## 2016-08-01 DIAGNOSIS — E1122 Type 2 diabetes mellitus with diabetic chronic kidney disease: Secondary | ICD-10-CM | POA: Diagnosis not present

## 2016-08-01 DIAGNOSIS — K219 Gastro-esophageal reflux disease without esophagitis: Secondary | ICD-10-CM | POA: Diagnosis not present

## 2016-08-01 DIAGNOSIS — I129 Hypertensive chronic kidney disease with stage 1 through stage 4 chronic kidney disease, or unspecified chronic kidney disease: Secondary | ICD-10-CM | POA: Diagnosis not present

## 2016-08-01 DIAGNOSIS — D72829 Elevated white blood cell count, unspecified: Secondary | ICD-10-CM | POA: Diagnosis not present

## 2016-08-01 DIAGNOSIS — Z87891 Personal history of nicotine dependence: Secondary | ICD-10-CM | POA: Diagnosis not present

## 2016-08-01 DIAGNOSIS — Z853 Personal history of malignant neoplasm of breast: Secondary | ICD-10-CM | POA: Diagnosis not present

## 2016-08-01 DIAGNOSIS — Z8711 Personal history of peptic ulcer disease: Secondary | ICD-10-CM | POA: Diagnosis not present

## 2016-08-01 DIAGNOSIS — K5900 Constipation, unspecified: Secondary | ICD-10-CM | POA: Diagnosis not present

## 2016-08-01 DIAGNOSIS — D649 Anemia, unspecified: Secondary | ICD-10-CM

## 2016-08-01 DIAGNOSIS — K449 Diaphragmatic hernia without obstruction or gangrene: Secondary | ICD-10-CM | POA: Diagnosis not present

## 2016-08-01 DIAGNOSIS — K2970 Gastritis, unspecified, without bleeding: Secondary | ICD-10-CM | POA: Diagnosis not present

## 2016-08-01 DIAGNOSIS — E1165 Type 2 diabetes mellitus with hyperglycemia: Secondary | ICD-10-CM | POA: Diagnosis not present

## 2016-08-01 DIAGNOSIS — Z86010 Personal history of colonic polyps: Secondary | ICD-10-CM | POA: Diagnosis not present

## 2016-08-01 DIAGNOSIS — Z7984 Long term (current) use of oral hypoglycemic drugs: Secondary | ICD-10-CM | POA: Diagnosis not present

## 2016-08-01 DIAGNOSIS — Z79899 Other long term (current) drug therapy: Secondary | ICD-10-CM | POA: Diagnosis not present

## 2016-08-01 DIAGNOSIS — H409 Unspecified glaucoma: Secondary | ICD-10-CM | POA: Diagnosis not present

## 2016-08-01 DIAGNOSIS — N179 Acute kidney failure, unspecified: Secondary | ICD-10-CM | POA: Diagnosis not present

## 2016-08-01 DIAGNOSIS — K2211 Ulcer of esophagus with bleeding: Secondary | ICD-10-CM | POA: Diagnosis not present

## 2016-08-01 DIAGNOSIS — D62 Acute posthemorrhagic anemia: Secondary | ICD-10-CM | POA: Diagnosis not present

## 2016-08-01 LAB — CBC
HEMATOCRIT: 25.4 % — AB (ref 36.0–46.0)
HEMOGLOBIN: 8.7 g/dL — AB (ref 12.0–15.0)
MCH: 29 pg (ref 26.0–34.0)
MCHC: 34.3 g/dL (ref 30.0–36.0)
MCV: 84.7 fL (ref 78.0–100.0)
Platelets: 294 10*3/uL (ref 150–400)
RBC: 3 MIL/uL — ABNORMAL LOW (ref 3.87–5.11)
RDW: 16.3 % — AB (ref 11.5–15.5)
WBC: 8.8 10*3/uL (ref 4.0–10.5)

## 2016-08-01 LAB — TYPE AND SCREEN
ABO/RH(D): O POS
ANTIBODY SCREEN: NEGATIVE
Unit division: 0
Unit division: 0

## 2016-08-01 LAB — BASIC METABOLIC PANEL
Anion gap: 6 (ref 5–15)
BUN: 34 mg/dL — ABNORMAL HIGH (ref 6–20)
CALCIUM: 9.4 mg/dL (ref 8.9–10.3)
CHLORIDE: 107 mmol/L (ref 101–111)
CO2: 24 mmol/L (ref 22–32)
CREATININE: 1.28 mg/dL — AB (ref 0.44–1.00)
GFR calc Af Amer: 48 mL/min — ABNORMAL LOW (ref >60)
GFR calc non Af Amer: 41 mL/min — ABNORMAL LOW (ref >60)
Glucose, Bld: 110 mg/dL — ABNORMAL HIGH (ref 65–99)
Potassium: 4.5 mmol/L (ref 3.5–5.1)
SODIUM: 137 mmol/L (ref 135–145)

## 2016-08-01 LAB — GLUCOSE, CAPILLARY
Glucose-Capillary: 119 mg/dL — ABNORMAL HIGH (ref 65–99)
Glucose-Capillary: 190 mg/dL — ABNORMAL HIGH (ref 65–99)

## 2016-08-01 LAB — HEMOGLOBIN AND HEMATOCRIT, BLOOD
HCT: 27.4 % — ABNORMAL LOW (ref 36.0–46.0)
Hemoglobin: 9.2 g/dL — ABNORMAL LOW (ref 12.0–15.0)

## 2016-08-01 MED ORDER — SUCRALFATE 1 GM/10ML PO SUSP: 1.0000 g | Freq: Four times a day (QID) | ORAL | 0 refills | Status: DC

## 2016-08-01 MED ORDER — PANTOPRAZOLE SODIUM 40 MG PO TBEC: 40.0000 mg | DELAYED_RELEASE_TABLET | Freq: Two times a day (BID) | ORAL | 1 refills | Status: DC

## 2016-08-01 NOTE — Discharge Instructions (Signed)
Anemia, Nonspecific Anemia is a condition in which the concentration of red blood cells or hemoglobin in the blood is below normal. Hemoglobin is a substance in red blood cells that carries oxygen to the tissues of the body. Anemia results in not enough oxygen reaching these tissues.  CAUSES  Common causes of anemia include:   Excessive bleeding. Bleeding may be internal or external. This includes excessive bleeding from periods (in women) or from the intestine.   Poor nutrition.   Chronic kidney, thyroid, and liver disease.  Bone marrow disorders that decrease red blood cell production.  Cancer and treatments for cancer.  HIV, AIDS, and their treatments.  Spleen problems that increase red blood cell destruction.  Blood disorders.  Excess destruction of red blood cells due to infection, medicines, and autoimmune disorders. SIGNS AND SYMPTOMS   Minor weakness.   Dizziness.   Headache.  Palpitations.   Shortness of breath, especially with exercise.   Paleness.  Cold sensitivity.  Indigestion.  Nausea.  Difficulty sleeping.  Difficulty concentrating. Symptoms may occur suddenly or they may develop slowly.  DIAGNOSIS  Additional blood tests are often needed. These help your health care provider determine the best treatment. Your health care provider will check your stool for blood and look for other causes of blood loss.  TREATMENT  Treatment varies depending on the cause of the anemia. Treatment can include:   Supplements of iron, vitamin B12, or folic acid.   Hormone medicines.   A blood transfusion. This may be needed if blood loss is severe.   Hospitalization. This may be needed if there is significant continual blood loss.   Dietary changes.  Spleen removal. HOME CARE INSTRUCTIONS Keep all follow-up appointments. It often takes many weeks to correct anemia, and having your health care provider check on your condition and your response to  treatment is very important. SEEK IMMEDIATE MEDICAL CARE IF:   You develop extreme weakness, shortness of breath, or chest pain.   You become dizzy or have trouble concentrating.  You develop heavy vaginal bleeding.   You develop a rash.   You have bloody or black, tarry stools.   You faint.   You vomit up blood.   You vomit repeatedly.   You have abdominal pain.  You have a fever or persistent symptoms for more than 2-3 days.   You have a fever and your symptoms suddenly get worse.   You are dehydrated.  MAKE SURE YOU:  Understand these instructions.  Will watch your condition.  Will get help right away if you are not doing well or get worse.   This information is not intended to replace advice given to you by your health care provider. Make sure you discuss any questions you have with your health care provider.   Document Released: 12/12/2004 Document Revised: 07/07/2013 Document Reviewed: 04/30/2013 Elsevier Interactive Patient Education 2016 Elsevier Inc.  

## 2016-08-01 NOTE — Discharge Summary (Signed)
Physician Discharge Summary  RUSSIA SCHEIDERER VOJ:[id number] DOB: 1946/01/22 DOA: 07/30/2016  PCP: Foye Spurling, MD  Admit date: 07/30/2016 Discharge date: 08/01/2016  Recommendations for Outpatient Follow-up:  1. Pt will need to follow up with PCP in 2-3 weeks post discharge 2. Pt advised to continue taking Protonix 40 mg PO BID for one month and after that needs to continue PO QD regimen  3. Plan to repeat EGD in ~ 3 months   Discharge Diagnoses:  Active Problems:   GI bleed   Acute esophagitis  Discharge Condition: Stable  Diet recommendation: Heart healthy diet discussed in details   Brief Narrative:  70 y.o.femalewith medical history significant of remote breast cancer, DM, HTN, and glaucoma, presented for evaluation dizziness and dark, tarry colored stools.   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 - secondary to the above - review of records indicated GFT in 50's about three years ago with baseline Cr 1.2  - Cr at baseline on discharge     Hyponatremia - mild, pre renal - resolved     Leukocytosis - appears to be reactive - no signs of an infectious etiology    Essential HTN - continue home medical regimen     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 There is no focal infiltrate, pulmonary edema, or pleural effusion. Calcified granulomas of the left lung base are unchanged. Surgical clips are identified in the right axilla. The visualized skeletal structures are unremarkable. IMPRESSION: No active cardiopulmonary disease. Electronically Signed   By: Abelardo Diesel M.D.   On: 07/30/2016 15:00   Dg Cervical Spine Complete  Result Date: 07/04/2016 CLINICAL DATA:  70 year old female with right neck and shoulder pain for 1 month with no known injury. Initial encounter. EXAM: CERVICAL SPINE - COMPLETE 4+ VIEW COMPARISON:  Right shoulder arthrogram 12/28/2015. FINDINGS: Normal prevertebral soft tissue contour. Mild straightening of cervical lordosis. Disc space loss and endplate spurring X9-B7 through C6-C7. Bilateral posterior element alignment is within normal limits. Cervicothoracic junction alignment is within normal limits. AP alignment within normal limits. Negative lung apices. Normal C1-C2 alignment and odontoid. Minimal left cervical carotid calcified atherosclerosis. IMPRESSION: 1.  No acute osseous abnormality in the cervical spine. 2. 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 Electronically Signed   By: Genevie Ann M.D.   On: 07/04/2016 09:41   Discharge Exam: Vitals:   07/31/16 2001 08/01/16 0500  BP: (!) 113/48 (!) 128/58  Pulse: 78 64  Resp: 20 18  Temp: 99.4 F (37.4 C) 97.8 F (36.6 C)   Vitals:   07/31/16 1140 07/31/16 1243 07/31/16  2001 08/01/16 0500  BP:  (!) 117/43 (!) 113/48 (!) 128/58  Pulse:  66 78 64  Resp:  20 20 18   Temp:  97.6 F (36.4 C) 99.4 F (37.4 C) 97.8 F (36.6 C)  TempSrc:  Oral Oral Oral  SpO2: 100% 99% 98% 100%  Weight:      Height:        General: Pt is alert, follows commands appropriately, not in acute distress Cardiovascular: Regular rate and rhythm, S1/S2  +, no murmurs, no rubs, no gallops Respiratory: Clear to auscultation bilaterally, no wheezing, no crackles, no rhonchi Abdominal: Soft, non tender, non distended, bowel sounds +, no guarding  Discharge Instructions  Discharge Instructions    Diet - low sodium heart healthy    Complete by:  As directed    Increase activity slowly    Complete by:  As directed        Medication List    TAKE these medications   allopurinol 100 MG tablet Commonly known as:  ZYLOPRIM Take 100 mg by mouth daily.   amLODipine 10 MG tablet Commonly known as:  NORVASC Take 10 mg by mouth daily.   bimatoprost 0.03 % ophthalmic solution Commonly known as:  LUMIGAN Place 1 drop into both eyes at bedtime.   folic acid 1 MG tablet Commonly known as:  FOLVITE Take 1 mg by mouth daily.   glipiZIDE 5 MG tablet Commonly known as:  GLUCOTROL Take 5 mg by mouth daily as needed (high blood sugar).   hydrochlorothiazide 12.5 MG tablet Commonly known as:  HYDRODIURIL Take 12.5 mg by mouth daily.   lovastatin 20 MG tablet Commonly known as:  MEVACOR Take 20 mg by mouth at bedtime.   metFORMIN 1000 MG tablet Commonly known as:  GLUCOPHAGE Take 1,000 mg by mouth daily with breakfast.   NOVOFINE PLUS 32G X 4 MM Misc Generic drug:  Insulin Pen Needle   pantoprazole 40 MG tablet Commonly known as:  PROTONIX Take 1 tablet (40 mg total) by mouth 2 (two) times daily. Continue taking one tablet twice daily for one month and after that continue taking one table once daily.   psyllium 0.52 g capsule Commonly known as:  REGULOID Take 0.52 g by mouth daily as needed (constipation).   sucralfate 1 GM/10ML suspension Commonly known as:  CARAFATE Take 10 mLs (1 g total) by mouth every 6 (six) hours.   SYSTANE BALANCE OP Place 1 drop into both eyes 3 (three) times daily as needed (dry eyes).   VICTOZA 18 MG/3ML Sopn Generic drug:  Liraglutide Inject 18 mg into the skin daily.   vitamin B-12 250 MCG  tablet Commonly known as:  CYANOCOBALAMIN Take 500 mcg by mouth daily.      Follow-up Information    Foye Spurling, MD .   Specialty:  Internal Medicine Contact information: [address] [place] [address] [phone]        Faye Ramsay, MD .   Specialty:  Internal Medicine Why:  call my cell with questions [phone] Contact information: [address] [place] [address] [phone]            The results of significant diagnostics from this hospitalization (including imaging, microbiology, ancillary and laboratory) are listed below for reference.     Microbiology: No results found for this or any previous visit (from the past 240 hour(s)).   Labs: Basic Metabolic Panel:  Recent Labs Lab 07/30/16 1425 07/31/16 0529 08/01/16 0103  NA 133* 134* 137  K 4.7 4.6 4.5  CL 101 105 107  CO2 19* 22 24  GLUCOSE 285* 108* 110*  BUN 68* 65* 34*  CREATININE 1.58* 1.56* 1.28*  CALCIUM 9.8 9.2 9.4   Liver Function Tests:  Recent Labs Lab 07/30/16 1425  AST 18  ALT 14  ALKPHOS 46  BILITOT 0.6  PROT 6.8  ALBUMIN 3.7   CBC:  Recent Labs Lab 07/30/16 1425 07/31/16 0529 07/31/16 1406 07/31/16 1711 08/01/16 0103 08/01/16 0842  WBC 11.4* 10.1  --   --  8.8  --   NEUTROABS 10.2*  --   --   --   --   --   HGB 7.3* 8.8* 9.1* 8.4* 8.7* 9.2*  HCT 22.1* 26.0* 26.7* 24.7* 25.4* 27.4*  MCV 89.8 84.7  --   --  84.7  --   PLT 396 281  --   --  294  --    CBG:  Recent Labs Lab 07/31/16 0735 07/31/16 1307 07/31/16 1611 07/31/16 2156 08/01/16 0800  GLUCAP 135* 178* 117* 124* 119*   SIGNED: Time coordinating discharge: 30 minutes  MAGICK-MYERS, ISKRA, MD  [facility] 08/01/2016, 11:09 AM Pager [phone]  If 7PM-7AM, please contact night-coverage [email or web address] Password [email or web address]

## 2016-08-01 NOTE — Progress Notes (Signed)
      Progress Note   Subjective  Patient did well overnight. Tolerating liquid diet. No bowel movements or vomiting. Hgb improved.    Objective   Vital signs in last 24 hours: Temp:  [97.6 F (36.4 C)-99.4 F (37.4 C)] 97.8 F (36.6 C) (09/14 0500) Pulse Rate:  [64-78] 64 (09/14 0500) Resp:  [18-23] 18 (09/14 0500) BP: (113-129)/(43-58) 128/58 (09/14 0500) SpO2:  [98 %-100 %] 100 % (09/14 0500) Weight:  [172 lb (78 kg)] 172 lb (78 kg) (09/13 1027) Last BM Date: 07/30/16 General:    AA female in NAD Heart:  Regular rate and rhythm; no murmurs Lungs: Respirations even and unlabored, lungs CTA bilaterally Abdomen:  Soft, nontender and nondistended. Normal bowel sounds. Extremities:  Without edema. Neurologic:  Alert and oriented,  grossly normal neurologically. Psych:  Cooperative. Normal mood and affect.  Intake/Output from previous day: 09/13 0701 - 09/14 0700 In: 1251.7 [I.V.:1251.7] Out: -  Intake/Output this shift: No intake/output data recorded.  Lab Results:  Recent Labs  07/30/16 1425 07/31/16 0529  07/31/16 1711 08/01/16 0103 08/01/16 0842  WBC 11.4* 10.1  --   --  8.8  --   HGB 7.3* 8.8*  < > 8.4* 8.7* 9.2*  HCT 22.1* 26.0*  < > 24.7* 25.4* 27.4*  PLT 396 281  --   --  294  --   < > = values in this interval not displayed. BMET  Recent Labs  07/30/16 1425 07/31/16 0529 08/01/16 0103  NA 133* 134* 137  K 4.7 4.6 4.5  CL 101 105 107  CO2 19* 22 24  GLUCOSE 285* 108* 110*  BUN 68* 65* 34*  CREATININE 1.58* 1.56* 1.28*  CALCIUM 9.8 9.2 9.4   LFT  Recent Labs  07/30/16 1425  PROT 6.8  ALBUMIN 3.7  AST 18  ALT 14  ALKPHOS 46  BILITOT 0.6   PT/INR No results for input(s): LABPROT, INR in the last 72 hours.  Studies/Results: Dg Chest 2 View  Result Date: 07/30/2016 CLINICAL DATA:  Generalized weakness and vomiting starting this morning EXAM: CHEST  2 VIEW COMPARISON:  August 14, 2010 FINDINGS: The heart size and mediastinal  contours are within normal limits. There is no focal infiltrate, pulmonary edema, or pleural effusion. Calcified granulomas of the left lung base are unchanged. Surgical clips are identified in the right axilla. The visualized skeletal structures are unremarkable. IMPRESSION: No active cardiopulmonary disease. Electronically Signed   By: Abelardo Diesel M.D.   On: 07/30/2016 15:00       Assessment / Plan:   70 y/o female who presented with symptoms of upper GI bleeding, also with history of frequent reflux symptoms. EGD yesterday showed large hiatal hernia with severe erosive esophagitis. She has had no further symptoms of bleeding on PPI, Hgb stable and BUN significantly downtrended.   At this time recommend the following: - advance to regular diet, stable for discharge home today - protonix 40mg  PO BID x 4 weeks, then once daily - liquid carafate 10cc po q 6 hrs PRN reflux / chest pain - repeat upper endoscopy in approximately 3 months to ensure healing and rule out Barrett's esophagus. Our office will coordinate this exam. - avoid NSAIDs  Please call with questions, concerns.   Haworth Cellar, MD [facility] Gastroenter[facility] Pager [phone]

## 2016-08-14 DIAGNOSIS — H40003 Preglaucoma, unspecified, bilateral: Secondary | ICD-10-CM | POA: Diagnosis not present

## 2016-09-04 ENCOUNTER — Ambulatory Visit: Payer: Medicare Other | Admitting: Podiatry

## 2016-09-23 DIAGNOSIS — E611 Iron deficiency: Secondary | ICD-10-CM | POA: Diagnosis not present

## 2016-09-23 DIAGNOSIS — K922 Gastrointestinal hemorrhage, unspecified: Secondary | ICD-10-CM | POA: Diagnosis not present

## 2016-09-23 DIAGNOSIS — I10 Essential (primary) hypertension: Secondary | ICD-10-CM | POA: Diagnosis not present

## 2016-09-23 DIAGNOSIS — D509 Iron deficiency anemia, unspecified: Secondary | ICD-10-CM | POA: Diagnosis not present

## 2016-09-23 DIAGNOSIS — E119 Type 2 diabetes mellitus without complications: Secondary | ICD-10-CM | POA: Diagnosis not present

## 2016-09-23 DIAGNOSIS — D649 Anemia, unspecified: Secondary | ICD-10-CM | POA: Diagnosis not present

## 2016-10-02 ENCOUNTER — Ambulatory Visit (INDEPENDENT_AMBULATORY_CARE_PROVIDER_SITE_OTHER): Payer: Medicare Other | Admitting: Ophthalmology

## 2016-10-02 DIAGNOSIS — E113592 Type 2 diabetes mellitus with proliferative diabetic retinopathy without macular edema, left eye: Secondary | ICD-10-CM

## 2016-10-02 DIAGNOSIS — H43811 Vitreous degeneration, right eye: Secondary | ICD-10-CM

## 2016-10-02 DIAGNOSIS — H338 Other retinal detachments: Secondary | ICD-10-CM | POA: Diagnosis not present

## 2016-10-02 DIAGNOSIS — H35033 Hypertensive retinopathy, bilateral: Secondary | ICD-10-CM | POA: Diagnosis not present

## 2016-10-02 DIAGNOSIS — E11311 Type 2 diabetes mellitus with unspecified diabetic retinopathy with macular edema: Secondary | ICD-10-CM

## 2016-10-02 DIAGNOSIS — E113511 Type 2 diabetes mellitus with proliferative diabetic retinopathy with macular edema, right eye: Secondary | ICD-10-CM

## 2016-10-02 DIAGNOSIS — I10 Essential (primary) hypertension: Secondary | ICD-10-CM | POA: Diagnosis not present

## 2016-10-23 ENCOUNTER — Other Ambulatory Visit: Payer: Self-pay | Admitting: Family Medicine

## 2016-10-23 DIAGNOSIS — Z1231 Encounter for screening mammogram for malignant neoplasm of breast: Secondary | ICD-10-CM

## 2016-11-04 DIAGNOSIS — D649 Anemia, unspecified: Secondary | ICD-10-CM | POA: Diagnosis not present

## 2016-11-04 DIAGNOSIS — I10 Essential (primary) hypertension: Secondary | ICD-10-CM | POA: Diagnosis not present

## 2016-11-04 DIAGNOSIS — D500 Iron deficiency anemia secondary to blood loss (chronic): Secondary | ICD-10-CM | POA: Diagnosis not present

## 2016-11-04 DIAGNOSIS — E538 Deficiency of other specified B group vitamins: Secondary | ICD-10-CM | POA: Diagnosis not present

## 2016-11-04 DIAGNOSIS — M150 Primary generalized (osteo)arthritis: Secondary | ICD-10-CM | POA: Diagnosis not present

## 2016-11-04 DIAGNOSIS — E119 Type 2 diabetes mellitus without complications: Secondary | ICD-10-CM | POA: Diagnosis not present

## 2016-11-08 ENCOUNTER — Telehealth: Payer: Self-pay | Admitting: Internal Medicine

## 2016-11-12 ENCOUNTER — Encounter: Payer: Self-pay | Admitting: Family Medicine

## 2016-11-12 ENCOUNTER — Ambulatory Visit: Payer: Self-pay

## 2016-11-12 ENCOUNTER — Ambulatory Visit (INDEPENDENT_AMBULATORY_CARE_PROVIDER_SITE_OTHER): Payer: Medicare Other | Admitting: Family Medicine

## 2016-11-12 VITALS — BP 112/60 | HR 72 | Ht 64.5 in | Wt 169.0 lb

## 2016-11-12 DIAGNOSIS — M5416 Radiculopathy, lumbar region: Secondary | ICD-10-CM | POA: Diagnosis not present

## 2016-11-12 DIAGNOSIS — M75101 Unspecified rotator cuff tear or rupture of right shoulder, not specified as traumatic: Secondary | ICD-10-CM

## 2016-11-12 DIAGNOSIS — G8929 Other chronic pain: Secondary | ICD-10-CM

## 2016-11-12 DIAGNOSIS — M25511 Pain in right shoulder: Principal | ICD-10-CM

## 2016-11-12 NOTE — Patient Instructions (Addendum)
Good to see you.  Injected your shoulder today  Ice is your friend.  We will get yuo in with physical therapy.  Keep doing everything else you are doing.  See me again in 4 weeks Happy New Year!

## 2016-11-12 NOTE — Assessment & Plan Note (Addendum)
Worsening symptoms. Patient given an injection today. We discussed with patient at great length. Patient will monitor closely. Patient will be sent to formal physical therapy. We discussed with her that I would like her to potentially consider referral to orthopedic surgery didn't discuss her surgical options which patient declined today. Patient will follow-up again with me in 4 weeks and we'll see how she is responding.

## 2016-11-12 NOTE — Telephone Encounter (Signed)
Ok to schedule EGD with Dr. Henrene Pastor

## 2016-11-12 NOTE — Assessment & Plan Note (Signed)
Patient was not evaluated for this today but would like to do more of physical therapy which she has never done in the past for.

## 2016-11-12 NOTE — Progress Notes (Signed)
[name] [place], [place] [address] Phone: [phone] Subjective:    I'm seeing this patient by the request  of:  Foye Spurling, MD  CC:  Neck pain f/u Right shoulder pain follow-up  [id number]  Teresa Barnes is a 70 y.o. female coming in with complaint of  Right-sided neck pain. Patient was found to have more of a muscle tightness we did attempt her point injections previously. Patient did have x-rays showing significant progression of arthritic changes. These were inability visualized by me. Patient is also been in formal physical therapy. Patient states never started with physical therapy. Having more pain in the right shoulder. Has known rotator cuff tear previously. Did not have it surgically repaired.   x-rays taken 07/04/2016 showed chronic disc and endplate degeneration changes from C4-C7 mild to moderate nature Patient did have an MRI of right shoulder fibular 9 2017. Patient did have a full neck thickness tear of the rotator cuff and mild degenerative changes. Patient does have superior labral degenerative changes as well.  Past Medical History:  Diagnosis Date  . Cancer ([place]) 1991   breast  . Diabetes mellitus without complication ([place])   . Glaucoma   . Hypertension   . Wears glasses    Past Surgical History:  Procedure Laterality Date  . COLONOSCOPY    . ESOPHAGOGASTRODUODENOSCOPY (EGD) WITH PROPOFOL N/A 07/31/2016   Procedure: ESOPHAGOGASTRODUODENOSCOPY (EGD) WITH PROPOFOL;  Surgeon: Manus Gunning, MD;  Location: [facility];  Service: Gastroenterology;  Laterality: N/A;  . EYE SURGERY  2011   lt eye blled  . MASTECTOMY Right 21 years ago  . TRIGGER FINGER RELEASE Right 06/24/2013   Procedure: RELEASE TRIGGER FINGER/A-1 PULLEY PIP RIGHT LONG FINGER;  Surgeon: Cammie Sickle., MD;  Location: [facility];  Service: Orthopedics;  Laterality: Right;  . TUBAL LIGATION     Social History    Social History  . Marital status: Married    Spouse name: N/A  . Number of children: N/A  . Years of education: N/A   Occupational History  . retired    Social History Main Topics  . Smoking status: Former Smoker    Quit date: 06/18/1993  . Smokeless tobacco: Never Used  . Alcohol use No  . Drug use: No  . Sexual activity: Not Asked   Other Topics Concern  . None   Social History Narrative  . None   No Known Allergies Family History  Problem Relation Age of Onset  . Colon cancer Neg Hx   . Esophageal cancer Neg Hx   . Stomach cancer Neg Hx   . Rectal cancer Neg Hx     Past medical history, social, surgical and family history all reviewed in electronic medical record.  No pertanent information unless stated regarding to the chief complaint.   Review of Systems: No headache, visual changes, nausea, vomiting, diarrhea, constipation, dizziness, abdominal pain, skin rash, fevers, chills, night sweats, weight loss, swollen lymph nodes, body aches, joint swelling, muscle aches, chest pain, shortness of breath, mood changes.   Objective  Blood pressure 112/60, pulse 72, height 5' 4.5" (1.638 m), weight 169 lb (76.7 kg), SpO2 98 %.  General: No apparent distress alert and oriented x3 mood and affect normal, dressed appropriately.  HEENT: Pupils equal, extraocular movements intact  Respiratory: Patient's speak in full sentences and does not appear short of breath  Cardiovascular: No lower extremity edema, non tender, no erythema  Skin: Warm dry intact with no signs of infection or rash on extremities or on axial skeleton.  Abdomen: Soft nontender  Neuro: Cranial nerves II through XII are intact, neurovascularly intact in all extremities with 2+ DTRs and 2+ pulses.  Lymph: No lymphadenopathy of posterior or anterior cervical chain or axillae bilaterally.  Gait normal with good balance and coordination.  MSK:  Non tender with full range of motion and good stability and symmetric  strength and tone of elbows, wrist, hip, and ankles bilaterally.  Neck: Inspection unremarkable.patient does have some tightness in the right trapezius No palpable stepoffs. Negative Spurling's maneuver. Lacks last 5 of side bending and rotation to the right. Grip strength and sensation normal in bilateral hands Strength good C4 to T1 distribution No sensory change to C4 to T1 Negative Hoffman sign bilaterally Reflexes normal Shoulder: right  Inspection reveals no abnormalities, atrophy or asymmetry. Palpation is normal with no tenderness over AC joint or bicipital groove. ROM is full in all planes. Rotator cuff strength 4 out of 5 but symmetric Positive impingement Speeds and Yergason's tests normal. No labral pathology noted with negative Obrien's, negative clunk and good stability. Normal scapular function observed. No painful arc and no drop arm sign. No apprehension sign     Procedure: Real-time Ultrasound Guided Injection of right glenohumeral joint Device: GE Logiq E  Ultrasound guided injection is preferred based studies that show increased duration, increased effect, greater accuracy, decreased procedural pain, increased response rate with ultrasound guided versus blind injection.  Verbal informed consent obtained.  Time-out conducted.  Noted no overlying erythema, induration, or other signs of local infection.  Skin prepped in a sterile fashion.  Local anesthesia: Topical Ethyl chloride.  With sterile technique and under real time ultrasound guidance:  Joint visualized.  23g 1  inch needle inserted posterior approach. Pictures taken for needle placement. Patient did have injection of 2 cc of 1% lidocaine, 2 cc of 0.5% Marcaine, and 1.0 cc of Kenalog 40 mg/dL. Completed without difficulty  Pain immediately resolved suggesting accurate placement of the medication.  Advised to call if fevers/chills, erythema, induration, drainage, or persistent bleeding.  Images  permanently stored and available for review in the ultrasound unit.  Impression: Technically successful ultrasound guided injection.      Impression and Recommendations:     This case required medical decision making of moderate complexity.

## 2016-11-13 ENCOUNTER — Encounter: Payer: Self-pay | Admitting: Internal Medicine

## 2016-11-13 ENCOUNTER — Telehealth: Payer: Self-pay | Admitting: Internal Medicine

## 2016-11-13 NOTE — Telephone Encounter (Signed)
Pt called in and said that on the order that Dr [name] put in it was suppose to have the shoulder and the hamstring???  For PT

## 2016-11-14 NOTE — Telephone Encounter (Signed)
Dx of right hamstring strain has been added to PT order.

## 2016-11-14 NOTE — Telephone Encounter (Signed)
Can we add hamstring to PT order please.

## 2016-11-26 ENCOUNTER — Telehealth: Payer: Self-pay | Admitting: Internal Medicine

## 2016-11-26 NOTE — Telephone Encounter (Signed)
Spoke with pt and let her know she could try prilosec or nexium otc. Pt states she is concerned about her kidneys and does not want to take those. Discussed with pt that she could try zantac and see if it helps her. Pt verbalized understanding.

## 2016-11-27 ENCOUNTER — Encounter: Payer: Self-pay | Admitting: Physical Therapy

## 2016-11-27 ENCOUNTER — Ambulatory Visit: Payer: Medicare Other | Attending: Physical Medicine & Rehabilitation | Admitting: Physical Therapy

## 2016-11-27 DIAGNOSIS — M25611 Stiffness of right shoulder, not elsewhere classified: Secondary | ICD-10-CM | POA: Diagnosis not present

## 2016-11-27 DIAGNOSIS — M25651 Stiffness of right hip, not elsewhere classified: Secondary | ICD-10-CM | POA: Insufficient documentation

## 2016-11-27 DIAGNOSIS — M6281 Muscle weakness (generalized): Secondary | ICD-10-CM | POA: Insufficient documentation

## 2016-11-27 DIAGNOSIS — R2689 Other abnormalities of gait and mobility: Secondary | ICD-10-CM | POA: Diagnosis present

## 2016-11-27 NOTE — Patient Instructions (Addendum)
ROM: Flexion - Wand (Supine)    Lie on back holding wand. Raise arms over head.  Repeat __10__ times per set. Do __1__ sets per session. Do ____ sessions per day. 2 http://orth.exer.us/928   Copyright  VHI. All rights reserved.   ROM: External / Internal Rotation - Wand    Holding wand with left hand palm up, push out from body with other hand, palm down. Keep both elbows bent. When stretch is felt, hold __5__ seconds. Repeat to other side, leading with same hand. Keep elbows bent. Repeat _10___ times per set. Do _1___ sets per session. Do __1__ sessions per day.  http://orth.exer.us/748   Copyright  VHI. All rights reserved.  ROM: Extension - Wand (Standing)    Stand holding wand behind back. Raise arms as far as possible. Repeat __10__ times per set. Do ___1_ sets per session. Do _1___ sessions per day.  http://orth.exer.us/930   Copyright  VHI. All rights reserved.  Piriformis Stretch, Sitting    Sit, one ankle on opposite knee, same-side hand on crossed knee. Push down on knee, keeping spine straight. Lean torso forward, with flat back, until tension is felt in hamstrings and gluteals of crossed-leg side. Hold _30__ seconds.  Repeat _2__ times per session. Do _2__ sessions per day.  Copyright  VHI. All rights reserved.  Chair Sitting    Sit at edge of seat, spine straight, one leg extended. Put a hand on each thigh and bend forward from the hip, keeping spine straight. Allow hand on extended leg to reach toward toes. Support upper body with other arm. Hold _30__ seconds. Repeat _2__ times per session. Do __2_ sessions per day.  Copyright  VHI. All rights reserved.  [facility] [address] [place], [place] [address] Phone # [phone] Fax [phone]

## 2016-11-27 NOTE — Therapy (Signed)
[facility] [address] [place], [place], [address] Phone: [phone]   Fax:  [phone]  Physical Therapy Evaluation  Patient Details  Name: Teresa Barnes MRN: [id number] Date of Birth: 1945/11/22 Referring Provider: Dr. Olevia Bowens. [name]  Encounter Date: 11/27/2016      PT End of Session - 11/27/16 1500    Visit Number 1   Number of Visits 10   Date for PT Re-Evaluation 01/22/17   PT Start Time 1500   PT Stop Time 1545   PT Time Calculation (min) 45 min   Activity Tolerance Patient tolerated treatment well   Behavior During Therapy WFL for tasks assessed/performed      Past Medical History:  Diagnosis Date  . Cancer ([place]) 1991   breast  . Diabetes mellitus without complication ([place])   . Glaucoma   . Hypertension   . Wears glasses     Past Surgical History:  Procedure Laterality Date  . COLONOSCOPY    . ESOPHAGOGASTRODUODENOSCOPY (EGD) WITH PROPOFOL N/A 07/31/2016   Procedure: ESOPHAGOGASTRODUODENOSCOPY (EGD) WITH PROPOFOL;  Surgeon: Manus Gunning, MD;  Location: [facility];  Service: Gastroenterology;  Laterality: N/A;  . EYE SURGERY  2011   lt eye blled  . MASTECTOMY Right 21 years ago  . TRIGGER FINGER RELEASE Right 06/24/2013   Procedure: RELEASE TRIGGER FINGER/A-1 PULLEY PIP RIGHT LONG FINGER;  Surgeon: Cammie Sickle., MD;  Location: [facility];  Service: Orthopedics;  Laterality: Right;  . TUBAL LIGATION      There were no vitals filed for this visit.       Subjective Assessment - 11/27/16 1510    Subjective Patient reports 3 years ago she fell and 1.5 years later had problems in right shoulder.  Patient had steriod shots in right shoulder 2 weeks ago. Patient reports when she stands up with her first step the right leg is slow.    How long can you walk comfortably? right legs drags when walking   Diagnostic tests MRI showed 2 torn RTC muscles   Patient  Stated Goals reduce stiffness in right hamstring, reach with right shoulder, walk regularly   Currently in Pain? No/denies            [facility] PT Assessment - 11/27/16 0001      Assessment   Medical Diagnosis M75.101 tear of right rotator cuff, unspecified tear extent; X44.818H Strain of right hamstring   Referring Provider Dr. Olevia Bowens. Smith   Onset Date/Surgical Date 05/18/16   Hand Dominance Right   Prior Therapy None     Precautions   Precautions Other (comment)   Precaution Comments s/p right breast cancer; no UBE; needs head elevated due to haital hernia     Restrictions   Weight Bearing Restrictions No     Balance Screen   Has the patient fallen in the past 6 months No   Has the patient had a decrease in activity level because of a fear of falling?  Yes   Is the patient reluctant to leave their home because of a fear of falling?  No     Home Environment   Living Environment Private residence   Type of [place] to enter   Entrance Stairs-Number of Steps 2   Entrance Stairs-Rails Left;Right   Home Layout Two level     Prior Function   Level of Independence Independent   Vocation Retired  Cognition   Overall Cognitive Status Within Functional Limits for tasks assessed     Observation/Other Assessments   Focus on Therapeutic Outcomes (FOTO)  47% limitation  35% limitation     Posture/Postural Control   Posture/Postural Control No significant limitations     ROM / Strength   AROM / PROM / Strength AROM;PROM;Strength     AROM   AROM Assessment Site Shoulder   Right/Left Shoulder Right   Right Shoulder Extension 35 Degrees   Right Shoulder Flexion 85 Degrees   Right Shoulder ABduction 60 Degrees   Right Shoulder Internal Rotation 30 Degrees  L2   Right Shoulder External Rotation 52 Degrees   Right Hip Extension 0   Right Hip ABduction 10     PROM   PROM Assessment Site Shoulder   Right/Left Shoulder Right   Right Shoulder  Flexion 105 Degrees   Right Shoulder ABduction 90 Degrees   Right Shoulder Internal Rotation 35 Degrees   Right Shoulder External Rotation 55 Degrees   Right Hip External Rotation  40     Strength   Strength Assessment Site Shoulder   Right/Left Shoulder Right   Right Hip Extension 3+/5   Right Hip ABduction 3+/5   Right Knee Flexion 4-/5     Right Hip   Right Hip ABduction 15     Flexibility   Soft Tissue Assessment /Muscle Length yes   Hamstrings on right   Quadriceps on right   Piriformis on right     Palpation   Palpation comment no tenderness     Transfers   Transfers Supine to Sit   Supine to Sit 4: Min assist  has to help patient up      Ambulation/Gait   Ambulation/Gait Yes   Assistive device None   Gait Pattern Decreased stance time - right;Decreased step length - right;Decreased stride length;Decreased weight shift to right                           PT Education - 11/27/16 1545    Education provided Yes   Education Details hamstring stretch, piriformis stretch, shoulder cane exercise for flexion, ER, and extension   Person(s) Educated Patient   Methods Explanation;Verbal cues;Handout;Demonstration   Comprehension Verbalized understanding;Returned demonstration          PT Short Term Goals - 11/27/16 1552      PT SHORT TERM GOAL #1   Title independent with initial HEP   Time 4   Period Weeks   Status New     PT SHORT TERM GOAL #2   Title ability to roll in bed with >/= 25% greater ease due to increase right shoulder ROM and strength   Time 4   Period Weeks   Status New     PT SHORT TERM GOAL #3   Title walk with right leg dragging >/= 25% less due to increase mobility of right hip   Time 4   Period Weeks   Status New     PT SHORT TERM GOAL #4   Title right shoulder strength >/= 3/5 making it easier to reach overhead and behind her back   Time 4   Period Weeks   Status New           PT Long Term Goals - 11/27/16  1534      PT LONG TERM GOAL #1   Title independent with HEP   Time 8   Period  Weeks   Status New     PT LONG TERM GOAL #2   Title walk without dragging right leg  after she gets up from a chair   Time 8   Period Weeks   Status New     PT LONG TERM GOAL #3   Title go up and down steps with step over step using a railing due to right hip and knee strength >/= 4/5   Time 8   Period Weeks   Status New     PT LONG TERM GOAL #4   Title right shoulder AROM for flexion and abduction >/= 120 decrease so she is able to reach overhead   Time 8   Period Weeks   Status New     PT LONG TERM GOAL #5   Title roll in bed with >/= 50% greater ease due right shoulder strength >/= 4/5   Time 8   Period Weeks   Status New     Additional Long Term Goals   Additional Long Term Goals Yes     PT LONG TERM GOAL #6   Title FOTO score is </=35% limitation   Time 8   Period Weeks   Status New               Plan - 11/27/16 1501    Clinical Impression Statement Patient is a 71 year old female who fell 2 years ago injuring her right shoulder and right hamstring.  Patient reports no pain in right hamstring and shoulder.  She had a cortisone shot in her right shoulder which abolished her pain.  Right shoulder AROM and PROM limited by 50% and  right hip ROM is limited by 25%. Right shoulder strength is 2/5.  Right hip abduction is 3+/5 and  extension is 4-/5.  Patient ambulates with a slight limp on right and decreased weightbear on right.  Patient goes up and down stairs with step to step pattern due to fear of falling and decreased weight on right leg. Patient has diificulty rolling in bed due to decreased right shoulder ROM and strength.  Patient is of moderately complex evaluation due to an evoving condition and comorbidities such as s/p right breast cancer with lymphnodes removed, glaucoma and diabetes that could impact care provided. Patient will benefit from skilled therapy to improve right  shoulder and hip strength and ROM to improve function.    Rehab Potential Good   Clinical Impairments Affecting Rehab Potential s/p right mastectomy 21 years ago; Right breast cancer; Glaucoma   PT Frequency 2x / week   PT Duration 8 weeks   PT Treatment/Interventions Electrical Stimulation;Iontophoresis 4mg /ml Dexamethasone;Moist Heat;Cryotherapy;Patient/family education;Neuromuscular re-education;Therapeutic exercise;Therapeutic activities;Manual techniques;Passive range of motion;Dry needling;Energy conservation   PT Next Visit Plan joint mobilization to right shoulder and hip, right shoulder isometrics, right shoulder and hip ROM exercises, strengthening of right shoulder and hip;    PT Home Exercise Plan right shoulder isometrics, hip abduction stretchs   Recommended Other Services None   Consulted and Agree with Plan of Care Patient      Patient will benefit from skilled therapeutic intervention in order to improve the following deficits and impairments:  Decreased mobility, Decreased strength, Pain, Decreased activity tolerance, Decreased endurance, Increased muscle spasms, Decreased range of motion  Visit Diagnosis: Stiffness of right shoulder, not elsewhere classified - Plan: PT plan of care cert/re-cert  Stiffness of right hip, not elsewhere classified - Plan: PT plan of care cert/re-cert  Other abnormalities of gait  and mobility - Plan: PT plan of care cert/re-cert  Muscle weakness (generalized) - Plan: PT plan of care cert/re-cert      G-Codes - 28/20/60 1542    Functional Assessment Tool Used FOTO score is 47% limitation  goal is 35% limitation   Functional Limitation Carrying, moving and handling objects   Carrying, Moving and Handling Objects Current Status (R5615) At least 40 percent but less than 60 percent impaired, limited or restricted   Carrying, Moving and Handling Objects Goal Status (P7943) At least 20 percent but less than 40 percent impaired, limited or  restricted       Problem List Patient Active Problem List   Diagnosis Date Noted  . Absolute anemia   . Acute esophagitis   . GI bleed 07/30/2016  . Trigger point of right shoulder region 07/04/2016  . Muscle spasms of neck 06/25/2016  . Right lumbar radiculopathy 01/23/2016  . Arthritis of right acromioclavicular joint 01/02/2016  . Right rotator cuff tear 11/24/2015  . Hamstring tightness of right lower extremity 11/24/2015  . DM 04/03/2009    Earlie Counts, PT 11/27/16 3:58 PM   [facility] [address] [place], [place], [address] Phone: [phone]   Fax:  [phone]  Name: Teresa Barnes MRN: [id number] Date of Birth: 09/29/1946

## 2016-11-29 ENCOUNTER — Other Ambulatory Visit: Payer: Self-pay | Admitting: Family Medicine

## 2016-11-29 ENCOUNTER — Other Ambulatory Visit: Payer: Self-pay | Admitting: Pediatrics

## 2016-11-29 ENCOUNTER — Ambulatory Visit: Payer: Medicare Other | Admitting: Physical Therapy

## 2016-11-29 ENCOUNTER — Encounter: Payer: Self-pay | Admitting: Physical Therapy

## 2016-11-29 ENCOUNTER — Other Ambulatory Visit: Payer: Self-pay

## 2016-11-29 DIAGNOSIS — R2689 Other abnormalities of gait and mobility: Secondary | ICD-10-CM

## 2016-11-29 DIAGNOSIS — M25611 Stiffness of right shoulder, not elsewhere classified: Secondary | ICD-10-CM

## 2016-11-29 DIAGNOSIS — Z1231 Encounter for screening mammogram for malignant neoplasm of breast: Secondary | ICD-10-CM

## 2016-11-29 DIAGNOSIS — M6281 Muscle weakness (generalized): Secondary | ICD-10-CM

## 2016-11-29 DIAGNOSIS — M25651 Stiffness of right hip, not elsewhere classified: Secondary | ICD-10-CM

## 2016-11-29 NOTE — Patient Instructions (Signed)
Tennis ball or the spikey ball at [employer] exercise  The purpose of this exercise is to release those tight muscles around the shoulder blade and under your arm pit. Find those tight, thick muscles and put the ball there. Lean into the ball and hold it as you relax and breathe. You can stay on that same 5x, or move the ball around to a new spot. Spend about 10 minutes doing this.   THEN... Do the wall walking exercise 10x.   Swings:  Hold onto chair or counter top with your LT hand, bend slightly forward letting your RT arm hang down heavy. Let it swing or cirlcle 10-20 x in any direction. Shoulder: Posterior Capsule    Sit with feet flat, head centered. Keep arm low initially, then when you can bring the arm across the chest. Use other hand to bring arm closer to chest. Keep back straight, head up. Hold __20__ seconds. Repeat __3__ times. Do ___2_ sessions per day. Do this EASY!! CAUTION: Stretch slowly and gently.  Copyright  VHI. All rights reserved.

## 2016-11-29 NOTE — Therapy (Signed)
[facility] [address] [place], [place], [address] Phone: [phone]   Fax:  [phone]  Physical Therapy Treatment  Patient Details  Name: Teresa Barnes MRN: [id number] Date of Birth: [birth year] Referring Provider: Dr. Olevia Bowens. [name]  Encounter Date: 11/29/2016      PT End of Session - 11/29/16 1009    Visit Number 2   Number of Visits 10   Date for PT Re-Evaluation 01/22/17   PT Start Time 6415   PT Stop Time 1058   PT Time Calculation (min) 56 min   Activity Tolerance Patient tolerated treatment well   Behavior During Therapy WFL for tasks assessed/performed      Past Medical History:  Diagnosis Date  . Cancer ([place]) 1991   breast  . Diabetes mellitus without complication ([place])   . Glaucoma   . Hypertension   . Wears glasses     Past Surgical History:  Procedure Laterality Date  . COLONOSCOPY    . ESOPHAGOGASTRODUODENOSCOPY (EGD) WITH PROPOFOL N/A 07/31/2016   Procedure: ESOPHAGOGASTRODUODENOSCOPY (EGD) WITH PROPOFOL;  Surgeon: Manus Gunning, MD;  [facility];  Service: Gastroenterology;  Laterality: N/A;  . EYE SURGERY  2011   lt eye blled  . MASTECTOMY Right 21 years ago  . TRIGGER FINGER RELEASE Right 06/24/2013   Procedure: RELEASE TRIGGER FINGER/A-1 PULLEY PIP RIGHT LONG FINGER;  Surgeon: Cammie Sickle., MD;  Location: [facility];  Service: Orthopedics;  Laterality: Right;  . TUBAL LIGATION      There were no vitals filed for this visit.      Subjective Assessment - 11/29/16 1008    Subjective Hard time doing shoulder exercises due to her esophagus being irritated.    Currently in Pain? No/denies   Multiple Pain Sites No                         [facility] Adult PT Treatment/Exercise - 11/29/16 0001      Knee/Hip Exercises: Stretches   Active Hamstring Stretch Right;3 reps;20 seconds     Knee/Hip Exercises: Aerobic   Nustep L1 x  6 min     Knee/Hip Exercises: Supine   Other Supine Knee/Hip Exercises Review of supine cane exs 10x each     Shoulder Exercises: Standing   Flexion --  Finger ladder 5x with 5 sec hold, PTA assist scap     Shoulder Exercises: Pulleys   Flexion --  10x with 3 sec hold for stretch     Manual Therapy   Manual Therapy Joint mobilization;Passive ROM   Joint Mobilization Inferior/posterior glides RT shoulder   Passive ROM RT shoulder flexion,scapiton, ER/IR                PT Education - 11/29/16 1028    Education provided Yes   Education Details Self soft tissue release at home with tennis ball to lats and periscapular, pendulums   Person(s) Educated Patient   Methods Explanation;Demonstration;Verbal cues;Handout;Tactile cues   Comprehension Returned demonstration;Verbalized understanding          PT Short Term Goals - 11/27/16 1552      PT SHORT TERM GOAL #1   Title independent with initial HEP   Time 4   Period Weeks   Status New     PT SHORT TERM GOAL #2   Title ability to roll in bed with >/= 25% greater ease due to increase right shoulder ROM  and strength   Time 4   Period Weeks   Status New     PT SHORT TERM GOAL #3   Title walk with right leg dragging >/= 25% less due to increase mobility of right hip   Time 4   Period Weeks   Status New     PT SHORT TERM GOAL #4   Title right shoulder strength >/= 3/5 making it easier to reach overhead and behind her back   Time 4   Period Weeks   Status New           PT Long Term Goals - 11/27/16 1534      PT LONG TERM GOAL #1   Title independent with HEP   Time 8   Period Weeks   Status New     PT LONG TERM GOAL #2   Title walk without dragging right leg  after she gets up from a chair   Time 8   Period Weeks   Status New     PT LONG TERM GOAL #3   Title go up and down steps with step over step using a railing due to right hip and knee strength >/= 4/5   Time 8   Period Weeks   Status New      PT LONG TERM GOAL #4   Title right shoulder AROM for flexion and abduction >/= 120 decrease so she is able to reach overhead   Time 8   Period Weeks   Status New     PT LONG TERM GOAL #5   Title roll in bed with >/= 50% greater ease due right shoulder strength >/= 4/5   Time 8   Period Weeks   Status New     Additional Long Term Goals   Additional Long Term Goals Yes     PT LONG TERM GOAL #6   Title FOTO score is </=35% limitation   Time 8   Period Weeks   Status New               Plan - 11/29/16 1014    Clinical Impression Statement Pt states she can only come 1x week. She was having difficulties doing cane exercises but by propping her up a little more we successfully  pereformed and reviewed them. Pt received additional HEP today and was able to perform all exercises without pain just tightness.  Joint remians tight as well as posterior soft tissues. Pt was instructed how to use a tennis ball or something like that to perform daily soft tissue release at home.  First week, so no goals met yet.    Rehab Potential Good   Clinical Impairments Affecting Rehab Potential s/p right mastectomy 21 years ago; Right breast cancer; Glaucoma   PT Frequency 2x / week   PT Duration 8 weeks   PT Treatment/Interventions Electrical Stimulation;Iontophoresis 40m/ml Dexamethasone;Moist Heat;Cryotherapy;Patient/family education;Neuromuscular re-education;Therapeutic exercise;Therapeutic activities;Manual techniques;Passive range of motion;Dry needling;Energy conservation   PT Next Visit Plan Pt reports she can only come 1x week due to her copay being high.    Consulted and Agree with Plan of Care --      Patient will benefit from skilled therapeutic intervention in order to improve the following deficits and impairments:  Decreased mobility, Decreased strength, Pain, Decreased activity tolerance, Decreased endurance, Increased muscle spasms, Decreased range of motion  Visit  Diagnosis: Stiffness of right shoulder, not elsewhere classified  Stiffness of right hip, not elsewhere classified  Other abnormalities of  gait and mobility  Muscle weakness (generalized)     Problem List Patient Active Problem List   Diagnosis Date Noted  . Absolute anemia   . Acute esophagitis   . GI bleed 07/30/2016  . Trigger point of right shoulder region 07/04/2016  . Muscle spasms of neck 06/25/2016  . Right lumbar radiculopathy 01/23/2016  . Arthritis of right acromioclavicular joint 01/02/2016  . Right rotator cuff tear 11/24/2015  . Hamstring tightness of right lower extremity 11/24/2015  . DM 04/03/2009    Avannah Decker, PTA 11/29/2016, 10:58 AM  [facility] [address] [place], [place], [address] Phone: [phone]   Fax:  [phone]  Name: ALLIENE KLUGH MRN: [id number] Date of Birth: 11-17-1946

## 2016-12-03 ENCOUNTER — Encounter: Payer: Self-pay | Admitting: Physical Therapy

## 2016-12-03 ENCOUNTER — Ambulatory Visit: Payer: Medicare Other | Admitting: Physical Therapy

## 2016-12-03 DIAGNOSIS — M25651 Stiffness of right hip, not elsewhere classified: Secondary | ICD-10-CM

## 2016-12-03 DIAGNOSIS — M25611 Stiffness of right shoulder, not elsewhere classified: Secondary | ICD-10-CM | POA: Diagnosis not present

## 2016-12-03 DIAGNOSIS — M6281 Muscle weakness (generalized): Secondary | ICD-10-CM

## 2016-12-03 DIAGNOSIS — R2689 Other abnormalities of gait and mobility: Secondary | ICD-10-CM

## 2016-12-03 NOTE — Patient Instructions (Addendum)
ABDUCTION: Standing (Active)    Stand, feet flat. Lift right leg out to side. Use __0_ lbs. Complete _2__ sets of _15__ repetitions. Perform __1 sessions per day.  http://gtsc.exer.us/110   Copyright  VHI. All rights reserved.  EXTENSION: Standing (Active)    Stand, both feet flat. Draw right leg behind body as far as possible. Use _0__ lbs. Complete _2__ sets of _15__ repetitions. Perform __1_ sessions per day.  http://gtsc.exer.us/76   Copyright  VHI. All rights reserved.  FLEXION: Standing - Stable (Active)    Stand, both feet flat. Lift right knee toward ceiling. Use __0_ lbs. Complete _2__ sets of __15_ repetitions. Perform _1__ sessions per day.  http://gtsc.exer.us/28   Copyright  VHI. All rights reserved.  [facility] [address] [place], [place] [address] Phone # [phone] Fax [phone]

## 2016-12-03 NOTE — Therapy (Signed)
[facility] [address], [place], [place], [address] Phone: [phone]   Fax:  [phone]  Physical Therapy Treatment  Patient Details  Name: Teresa Barnes MRN: [id number] Date of Birth: 1946-07-04 Referring Provider: Dr. Olevia Bowens. [name]  Encounter Date: 12/03/2016      PT End of Session - 12/03/16 1453    Visit Number 3   Number of Visits 10   Date for PT Re-Evaluation 01/22/17   Authorization Type medicare g-code on 10th visit   PT Start Time 1400   PT Stop Time 1445   PT Time Calculation (min) 45 min   Activity Tolerance Patient tolerated treatment well   Behavior During Therapy [facility] for tasks assessed/performed      Past Medical History:  Diagnosis Date  . Cancer ([place]) 1991   breast  . Diabetes mellitus without complication ([place])   . Glaucoma   . Hypertension   . Wears glasses     Past Surgical History:  Procedure Laterality Date  . COLONOSCOPY    . ESOPHAGOGASTRODUODENOSCOPY (EGD) WITH PROPOFOL N/A 07/31/2016   Procedure: ESOPHAGOGASTRODUODENOSCOPY (EGD) WITH PROPOFOL;  Surgeon: Manus Gunning, MD;  [facility];  Service: Gastroenterology;  Laterality: N/A;  . EYE SURGERY  2011   lt eye blled  . MASTECTOMY Right 21 years ago  . TRIGGER FINGER RELEASE Right 06/24/2013   Procedure: RELEASE TRIGGER FINGER/A-1 PULLEY PIP RIGHT LONG FINGER;  Surgeon: Cammie Sickle., MD;  Location: [place];  Service: Orthopedics;  Laterality: Right;  . TUBAL LIGATION      There were no vitals filed for this visit.      Subjective Assessment - 12/03/16 1404    Subjective My esphogus is coming along. My shoulder is better.  I still drag my foot.    How long can you walk comfortably? right legs drags when walking   Diagnostic tests MRI showed 2 torn RTC muscles   Patient Stated Goals reduce stiffness in right hamstring, reach with right shoulder, walk regularly   Currently  in Pain? No/denies   Multiple Pain Sites No            [facility] PT Assessment - 12/03/16 0001      AROM   Right Shoulder Extension 50 Degrees   Right Shoulder Flexion 110 Degrees   Right Shoulder ABduction 80 Degrees                     [facility] Adult PT Treatment/Exercise - 12/03/16 0001      Shoulder Exercises: Pulleys   Flexion 2 minutes   ABduction 2 minutes     Manual Therapy   Manual Therapy Joint mobilization;Soft tissue mobilization   Manual therapy comments contract relax with right shoulder ROM exercises and right hip exercises   Joint Mobilization Inferior/posterior glides RT shoulder   Passive ROM RT shoulder flexion,scapiton, ER/IR, abduction, horizontal abduction; Right hip abduction, rotation, and stretch of posterior and lateral hip                PT Education - 12/03/16 1417    Education provided Yes   Education Details standing hip flexion, abduction, and extension   Person(s) Educated Patient   Methods Explanation;Demonstration;Handout   Comprehension Verbalized understanding;Returned demonstration          PT Short Term Goals - 12/03/16 1458      PT SHORT TERM GOAL #1   Title independent with initial HEP  Time 4   Period Weeks   Status Achieved     PT SHORT TERM GOAL #2   Title ability to roll in bed with >/= 25% greater ease due to increase right shoulder ROM and strength   Time 4   Period Weeks   Status On-going  goes to the left instead of right     PT SHORT TERM GOAL #3   Title walk with right leg dragging >/= 25% less due to increase mobility of right hip   Time 4   Period Weeks   Status On-going     PT SHORT TERM GOAL #4   Title right shoulder strength >/= 3/5 making it easier to reach overhead and behind her back   Time 4   Period Weeks   Status On-going  increase ROM           PT Long Term Goals - 11/27/16 1534      PT LONG TERM GOAL #1   Title independent with HEP   Time 8   Period Weeks    Status New     PT LONG TERM GOAL #2   Title walk without dragging right leg  after she gets up from a chair   Time 8   Period Weeks   Status New     PT LONG TERM GOAL #3   Title go up and down steps with step over step using a railing due to right hip and knee strength >/= 4/5   Time 8   Period Weeks   Status New     PT LONG TERM GOAL #4   Title right shoulder AROM for flexion and abduction >/= 120 decrease so she is able to reach overhead   Time 8   Period Weeks   Status New     PT LONG TERM GOAL #5   Title roll in bed with >/= 50% greater ease due right shoulder strength >/= 4/5   Time 8   Period Weeks   Status New     Additional Long Term Goals   Additional Long Term Goals Yes     PT LONG TERM GOAL #6   Title FOTO score is </=35% limitation   Time 8   Period Weeks   Status New               Plan - 12/03/16 1454    Clinical Impression Statement Patient has increased right shoulder AROM for flexion, abduction, and extension.  Patient reports her right foot still will drag.  Patient will contract her right upper trap with right shoulder flexion and abduction. Patient has tight lateral hip capsule.  Patient will increase ROM with contract relax. Patient will benefit from skilled therapy to improve right shoulder and hip ROM and strength to restore prior function.    Rehab Potential Good   Clinical Impairments Affecting Rehab Potential s/p right mastectomy 21 years ago; Right breast cancer; Glaucoma   PT Frequency 2x / week   PT Duration 8 weeks   PT Treatment/Interventions Electrical Stimulation;Iontophoresis 4mg /ml Dexamethasone;Moist Heat;Cryotherapy;Patient/family education;Neuromuscular re-education;Therapeutic exercise;Therapeutic activities;Manual techniques;Passive range of motion;Dry needling;Energy conservation   PT Next Visit Plan Pt reports she can only come 1x week due to her copay being high. shoulder AROM in sitting with right upper trap relax ; hip  extension with bridges; right shoulder isometrics   PT Home Exercise Plan progress as needed   Consulted and Agree with Plan of Care Patient  Patient will benefit from skilled therapeutic intervention in order to improve the following deficits and impairments:  Decreased mobility, Decreased strength, Pain, Decreased activity tolerance, Decreased endurance, Increased muscle spasms, Decreased range of motion  Visit Diagnosis: Stiffness of right shoulder, not elsewhere classified  Stiffness of right hip, not elsewhere classified  Other abnormalities of gait and mobility  Muscle weakness (generalized)     Problem List Patient Active Problem List   Diagnosis Date Noted  . Absolute anemia   . Acute esophagitis   . GI bleed 07/30/2016  . Trigger point of right shoulder region 07/04/2016  . Muscle spasms of neck 06/25/2016  . Right lumbar radiculopathy 01/23/2016  . Arthritis of right acromioclavicular joint 01/02/2016  . Right rotator cuff tear 11/24/2015  . Hamstring tightness of right lower extremity 11/24/2015  . DM 04/03/2009    Earlie Counts, PT 12/03/16 3:03 PM   [facility] [address] [place], [place], [address] Phone: [phone]   Fax:  [phone]  Name: KAIANA MARION MRN: [id number] Date of Birth: 01-Jun-1946

## 2016-12-09 ENCOUNTER — Encounter: Payer: Self-pay | Admitting: Physical Therapy

## 2016-12-09 ENCOUNTER — Ambulatory Visit: Payer: Medicare Other | Admitting: Physical Therapy

## 2016-12-09 DIAGNOSIS — M25611 Stiffness of right shoulder, not elsewhere classified: Secondary | ICD-10-CM | POA: Diagnosis not present

## 2016-12-09 DIAGNOSIS — M6281 Muscle weakness (generalized): Secondary | ICD-10-CM

## 2016-12-09 DIAGNOSIS — R2689 Other abnormalities of gait and mobility: Secondary | ICD-10-CM

## 2016-12-09 DIAGNOSIS — M25651 Stiffness of right hip, not elsewhere classified: Secondary | ICD-10-CM

## 2016-12-09 NOTE — Therapy (Signed)
[facility] [address], [place], [place], [address] Phone: [phone]   Fax:  [phone]  Physical Therapy Treatment  Patient Details  Name: Teresa Barnes MRN: [id number] Date of Birth: 01-12-1946 Referring Provider: Dr. Olevia Bowens. [name]  Encounter Date: 12/09/2016      PT End of Session - 12/09/16 1445    Visit Number 4   Number of Visits 10   Date for PT Re-Evaluation 01/22/17   Authorization Type medicare g-code on 10th visit   PT Start Time 1400   PT Stop Time 1443   PT Time Calculation (min) 43 min   Activity Tolerance Patient tolerated treatment well   Behavior During Therapy [facility] for tasks assessed/performed      Past Medical History:  Diagnosis Date  . Cancer ([place]) 1991   breast  . Diabetes mellitus without complication ([place])   . Glaucoma   . Hypertension   . Wears glasses     Past Surgical History:  Procedure Laterality Date  . COLONOSCOPY    . ESOPHAGOGASTRODUODENOSCOPY (EGD) WITH PROPOFOL N/A 07/31/2016   Procedure: ESOPHAGOGASTRODUODENOSCOPY (EGD) WITH PROPOFOL;  Surgeon: Manus Gunning, MD;  [facility];  Service: Gastroenterology;  Laterality: N/A;  . EYE SURGERY  2011   lt eye blled  . MASTECTOMY Right 21 years ago  . TRIGGER FINGER RELEASE Right 06/24/2013   Procedure: RELEASE TRIGGER FINGER/A-1 PULLEY PIP RIGHT LONG FINGER;  Surgeon: Cammie Sickle., MD;  Location: [place];  Service: Orthopedics;  Laterality: Right;  . TUBAL LIGATION      There were no vitals filed for this visit.      Subjective Assessment - 12/09/16 1402    Subjective 0I see the doctor tomorrow. Right leg drags 25% less when walking. Right hamstring is not bothering me.    How long can you walk comfortably? right legs drags when walking   Diagnostic tests MRI showed 2 torn RTC muscles   Patient Stated Goals reduce stiffness in right hamstring, reach with right shoulder,  walk regularly   Currently in Pain? No/denies            [facility] PT Assessment - 12/09/16 0001      Assessment   Medical Diagnosis M75.101 tear of right rotator cuff, unspecified tear extent; X32.440N Strain of right hamstring   Referring Provider Dr. Olevia Bowens. Smith   Onset Date/Surgical Date 05/18/16   Hand Dominance Right   Prior Therapy None     Precautions   Precautions Other (comment)   Precaution Comments s/p right breast cancer; no UBE; needs head elevated due to haital hernia     Restrictions   Weight Bearing Restrictions No     Balance Screen   Has the patient fallen in the past 6 months No   Has the patient had a decrease in activity level because of a fear of falling?  No   Is the patient reluctant to leave their home because of a fear of falling?  No     Home Environment   Living Environment Private residence   Type of [place] Access Stairs to enter   Entrance Stairs-Number of Steps 2   Entrance Stairs-Rails Left;Right   Home Layout Two level     Prior Function   Level of Independence Independent   Vocation Retired     Associate Professor   Overall Cognitive Status Within Functional Limits for tasks assessed  AROM   Right Shoulder Extension 50 Degrees   Right Shoulder Flexion 110 Degrees   Right Shoulder ABduction 90 Degrees   Right Shoulder Internal Rotation 70 Degrees   Right Shoulder External Rotation 80 Degrees     PROM   Right Shoulder Flexion 130 Degrees   Right Shoulder ABduction 140 Degrees  scaption   Right Shoulder Internal Rotation 80 Degrees   Right Shoulder External Rotation 90 Degrees     Strength   Right Shoulder Flexion 3-/5   Right Shoulder Extension 4/5   Right Shoulder ABduction 3/5   Right Shoulder Internal Rotation 4/5   Right Shoulder External Rotation 4/5   Right Hip Extension 4-/5   Right Hip ABduction 4-/5   Right Knee Flexion 4/5                     [facility] Adult PT Treatment/Exercise - 12/09/16  0001      Shoulder Exercises: Supine   Flexion Strengthening;Right;10 reps;Weights  2 sets; reclined     Shoulder Exercises: Pulleys   Flexion 2 minutes   ABduction 2 minutes     Manual Therapy   Manual Therapy Soft tissue mobilization   Soft tissue mobilization right hamstring   Passive ROM RT shoulder flexion,scapiton, ER/IR, abduction, horizontal abduction; Right hip abduction, rotation, and stretch of posterior and lateral hip                PT Education - 12/09/16 1445    Education provided Yes   Education Details shoulder strengthening   Person(s) Educated Patient   Methods Explanation;Demonstration;Verbal cues;Handout   Comprehension Returned demonstration;Verbalized understanding          PT Short Term Goals - 12/09/16 1448      PT SHORT TERM GOAL #1   Title independent with initial HEP   Time 4   Period Weeks   Status Achieved     PT SHORT TERM GOAL #2   Title ability to roll in bed with >/= 25% greater ease due to increase right shoulder ROM and strength   Time 4   Period Weeks   Status Achieved     PT SHORT TERM GOAL #3   Title walk with right leg dragging >/= 25% less due to increase mobility of right hip   Time 4   Period Weeks     PT SHORT TERM GOAL #4   Status Achieved           PT Long Term Goals - 11/27/16 1534      PT LONG TERM GOAL #1   Title independent with HEP   Time 8   Period Weeks   Status New     PT LONG TERM GOAL #2   Title walk without dragging right leg  after she gets up from a chair   Time 8   Period Weeks   Status New     PT LONG TERM GOAL #3   Title go up and down steps with step over step using a railing due to right hip and knee strength >/= 4/5   Time 8   Period Weeks   Status New     PT LONG TERM GOAL #4   Title right shoulder AROM for flexion and abduction >/= 120 decrease so she is able to reach overhead   Time 8   Period Weeks   Status New     PT LONG TERM GOAL #5   Title roll in bed  with  >/= 50% greater ease due right shoulder strength >/= 4/5   Time 8   Period Weeks   Status New     Additional Long Term Goals   Additional Long Term Goals Yes     PT LONG TERM GOAL #6   Title FOTO score is </=35% limitation   Time 8   Period Weeks   Status New               Plan - 12/09/16 1446    Clinical Impression Statement Patient has increased strength of right shoulder and hip. Patient has increased ROM for right shoulder.  Patient reports her right shoulder and hip are 25% better.  She is  not draging her right leg with walking as much. Patient had no pain after therapy. Patient will benefit from skilled therapy to improve right shoulder and hip ROM and strength to restore prior function.    Rehab Potential Good   Clinical Impairments Affecting Rehab Potential s/p right mastectomy 21 years ago; Right breast cancer; Glaucoma   PT Frequency 2x / week   PT Duration 8 weeks   PT Treatment/Interventions Electrical Stimulation;Iontophoresis 4mg /ml Dexamethasone;Moist Heat;Cryotherapy;Patient/family education;Neuromuscular re-education;Therapeutic exercise;Therapeutic activities;Manual techniques;Passive range of motion;Dry needling;Energy conservation   PT Next Visit Plan Pt reports she can only come 1x week due to her copay being high. shoulder AROM in sitting with right upper trap relax ; hip extension with bridges; see what MD says   PT Home Exercise Plan progress as needed   Consulted and Agree with Plan of Care Patient      Patient will benefit from skilled therapeutic intervention in order to improve the following deficits and impairments:  Decreased mobility, Decreased strength, Pain, Decreased activity tolerance, Decreased endurance, Increased muscle spasms, Decreased range of motion  Visit Diagnosis: Stiffness of right shoulder, not elsewhere classified  Stiffness of right hip, not elsewhere classified  Other abnormalities of gait and mobility  Muscle weakness  (generalized)     Problem List Patient Active Problem List   Diagnosis Date Noted  . Absolute anemia   . Acute esophagitis   . GI bleed 07/30/2016  . Trigger point of right shoulder region 07/04/2016  . Muscle spasms of neck 06/25/2016  . Right lumbar radiculopathy 01/23/2016  . Arthritis of right acromioclavicular joint 01/02/2016  . Right rotator cuff tear 11/24/2015  . Hamstring tightness of right lower extremity 11/24/2015  . DM 04/03/2009    Earlie Counts, PT 12/09/16 2:49 PM   [facility] [address], [place], [place], [address] Phone: [phone]   Fax:  [phone]  Name: Teresa Barnes MRN: [id number] Date of Birth: [birth year]

## 2016-12-09 NOTE — Patient Instructions (Addendum)
Flexion - Supine (Dumbbell)    Lie with arms along body. Lift arms and shoulders straight up, palms forward. Repeat ___10_ times per set. Do ___2_ sets per session. Do __1__ sessions per week. Use __1__ lb weights.   Copyright  VHI. All rights reserved.   External Rotation: in Abduction - Supine (Dumbbell)    Lie with right elbow out, bent to 90, forearm down. Raise forearm, keeping elbow on surface. Repeat __10__ times per set. Do __2__ sets per session. Do _1___ sessions per week. Use _1___ lb weight.   Copyright  VHI. All rights reserved.  ABDUCTION: Sitting (Active)    Sit with arms hanging down. Lift right arm out to side and up as high as possible, keeping elbow straight. Use _1__ lbs. Complete _2__ sets of _10__ repetitions. Perform _1__ sessions per day.  Copyright  VHI. All rights reserved.  Arm Curl    Sit or stand with feet shoulder width apart, arms straight down at sides, palms forward. Inhale, then exhale while slowly curling weights toward shoulders and keeping elbows touching torso. Slowly return to starting position. Repeat __15__ times per set. Do __2__ sets per session. Do __1__ sessions per week. May be done with dumbbells, tubing or resistive band.  Copyright  VHI. All rights reserved.   [facility] [address], [place], [place] [address] Phone # [phone] Fax [phone]

## 2016-12-10 ENCOUNTER — Encounter: Payer: Self-pay | Admitting: Family Medicine

## 2016-12-10 ENCOUNTER — Ambulatory Visit (AMBULATORY_SURGERY_CENTER): Payer: Self-pay | Admitting: *Deleted

## 2016-12-10 ENCOUNTER — Ambulatory Visit (INDEPENDENT_AMBULATORY_CARE_PROVIDER_SITE_OTHER): Payer: Medicare Other | Admitting: Family Medicine

## 2016-12-10 VITALS — Ht 64.0 in | Wt 164.0 lb

## 2016-12-10 DIAGNOSIS — M75101 Unspecified rotator cuff tear or rupture of right shoulder, not specified as traumatic: Secondary | ICD-10-CM

## 2016-12-10 DIAGNOSIS — K2270 Barrett's esophagus without dysplasia: Secondary | ICD-10-CM

## 2016-12-10 NOTE — Patient Instructions (Signed)
Good to see you  You are doing great  Lets continue physical therapy for now.  I think you are making great progress.  When lifting try to keep your hands within your peripheral vision.  Ice is your friend See me again in 6-8 weeks.

## 2016-12-10 NOTE — Assessment & Plan Note (Signed)
Patient is doing relatively well after the injection previously. Doing extremely well with the physical therapy. Encourage her to finish the next 6 weeks with physical therapy. We discussed icing regimen. We discussed home exercise. Patient will continue be active. Follow-up again in 6-8 weeks.

## 2016-12-10 NOTE — Progress Notes (Signed)
No allergies to eggs or soy. No problems with anesthesia.  No oxygen use  No diet drug use  

## 2016-12-10 NOTE — Progress Notes (Signed)
[name] [place], [place] [address] Phone: [phone] Subjective:    I'm seeing this patient by the request  of:  Foye Spurling, MD  CC:  Neck pain f/u Right shoulder pain follow-up  [id number]  Teresa Barnes is a 71 y.o. female coming in with complaint of  Right-sided neck pain. Patient was found to have more of a muscle tightness we did attempt her point injections previously. Patient did have x-rays showing significant progression of arthritic changes. Patient was given an injection in the right shoulder December. Patient states that that didn't help and has been doing formal physical therapy. Is making significant improvement at this time. Trying to increase activity. Patient states that the pain is not as severe. Daily activities have become somewhat better as well.    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 Patient did have a full neck thickness tear of the rotator cuff and mild degenerative changes. Patient does have superior labral degenerative changes as well.  Past Medical History:  Diagnosis Date  . Anemia   . Blood transfusion without reported diagnosis 07/2016  . Cancer ([place]) 1991   breast  . Diabetes mellitus without complication ([place])   . GERD (gastroesophageal reflux disease)   . Glaucoma   . Hyperlipidemia   . Hypertension   . Wears glasses    Past Surgical History:  Procedure Laterality Date  . CATARACT EXTRACTION W/ INTRAOCULAR LENS  IMPLANT, BILATERAL  2000  . COLONOSCOPY    . ESOPHAGOGASTRODUODENOSCOPY (EGD) WITH PROPOFOL N/A 07/31/2016   Procedure: ESOPHAGOGASTRODUODENOSCOPY (EGD) WITH PROPOFOL;  Surgeon: Manus Gunning, MD;  [facility];  Service: Gastroenterology;  Laterality: N/A;  . EYE SURGERY  2011   lt eye blled  . MASTECTOMY Right 21 years ago  . TRIGGER  FINGER RELEASE Right 06/24/2013   Procedure: RELEASE TRIGGER FINGER/A-1 PULLEY PIP RIGHT LONG FINGER;  Surgeon: Cammie Sickle., MD;  Location: [place];  Service: Orthopedics;  Laterality: Right;  . TUBAL LIGATION     Social History   Social History  . Marital status: Married    Spouse name: N/A  . Number of children: N/A  . Years of education: N/A   Occupational History  . retired    Social History Main Topics  . Smoking status: Former Smoker    Quit date: 06/18/1993  . Smokeless tobacco: Never Used  . Alcohol use No  . Drug use: No  . Sexual activity: Not Asked   Other Topics Concern  . None   Social History Narrative  . None   No Known Allergies Family History  Problem Relation Age of Onset  . Colon cancer Neg Hx   . Esophageal cancer Neg Hx   . Stomach cancer Neg Hx   . Rectal cancer Neg Hx     Past medical history, social, surgical and family history all reviewed in electronic medical record.  No pertanent information unless stated regarding to the chief complaint.   Review of Systems: No headache, visual changes, nausea, vomiting, diarrhea, constipation, dizziness, abdominal pain, skin rash, fevers, chills, night sweats, weight loss, swollen lymph nodes,chest pain, shortness of breath, mood changes.    Objective  Blood pressure 122/62, pulse 77, height 5' 4.5" (1.638 m), weight 163 lb (73.9 kg), SpO2 97 %.  Systems examined below as of 12/10/16  General: NAD A&O x3 mood, affect normal  HEENT: Pupils equal, extraocular movements intact no nystagmus Respiratory: not short of breath at rest or with speaking Cardiovascular: No lower extremity edema, non tender Skin: Warm dry intact with no signs of infection or rash on extremities or on axial skeleton. Abdomen: Soft nontender, no masses Neuro: Cranial nerves  intact, neurovascularly intact in all extremities with 2+ DTRs and 2+ pulses. Lymph: No lymphadenopathy appreciated today  Gait normal  with good balance and coordination.  MSK: Non tender with full range of motion and good stability and symmetric strength and tone of  elbows, wrist,  knee hips and ankles bilaterally.   Neck: Inspection unremarkable. No palpable stepoffs. Negative Spurling's maneuver. Full neck range of motion Grip strength and sensation normal in bilateral hands Strength good C4 to T1 distribution No sensory change to C4 to T1 Negative Hoffman sign bilaterally Reflexes normal Shoulder: right  Inspection reveals no abnormalities, atrophy or asymmetry. Palpation is normal with no tenderness over AC joint or bicipital groove. ROM is full in all planes. Rotator cuff strength 4+ out of 5 but symmetric Positive impingement still noted but improved Speeds and Yergason's tests normal. No labral pathology noted with negative Obrien's, negative clunk and good stability. Normal scapular function observed. No painful arc and no drop arm sign. No apprehension sign Contralateral shoulder unremarkable.      Impression and Recommendations:     This case required medical decision making of moderate complexity.

## 2016-12-16 ENCOUNTER — Encounter: Payer: Self-pay | Admitting: Physical Therapy

## 2016-12-16 ENCOUNTER — Ambulatory Visit: Payer: Medicare Other | Admitting: Physical Therapy

## 2016-12-16 DIAGNOSIS — M6281 Muscle weakness (generalized): Secondary | ICD-10-CM

## 2016-12-16 DIAGNOSIS — M25611 Stiffness of right shoulder, not elsewhere classified: Secondary | ICD-10-CM

## 2016-12-16 DIAGNOSIS — R2689 Other abnormalities of gait and mobility: Secondary | ICD-10-CM

## 2016-12-16 NOTE — Therapy (Signed)
[facility] [address] [place], [place], [address] Phone: [phone]   Fax:  [phone]  Physical Therapy Treatment  Patient Details  Name: Teresa Barnes MRN: [id number] Date of Birth: 16-May-1946 Referring Provider: Dr. Olevia Bowens. [name]  Encounter Date: 12/16/2016      PT End of Session - 12/16/16 1528    Visit Number 5   Number of Visits 10   Date for PT Re-Evaluation 01/22/17   Authorization Type medicare g-code on 10th visit   PT Start Time 1445   PT Stop Time 1528   PT Time Calculation (min) 43 min   Activity Tolerance Patient tolerated treatment well   Behavior During Therapy [facility] for tasks assessed/performed      Past Medical History:  Diagnosis Date  . Anemia   . Blood transfusion without reported diagnosis 07/2016  . Cancer ([place]) 1991   breast  . Diabetes mellitus without complication ([place])   . GERD (gastroesophageal reflux disease)   . Glaucoma   . Hyperlipidemia   . Hypertension   . Wears glasses     Past Surgical History:  Procedure Laterality Date  . CATARACT EXTRACTION W/ INTRAOCULAR LENS  IMPLANT, BILATERAL  2000  . COLONOSCOPY    . ESOPHAGOGASTRODUODENOSCOPY (EGD) WITH PROPOFOL N/A 07/31/2016   Procedure: ESOPHAGOGASTRODUODENOSCOPY (EGD) WITH PROPOFOL;  Surgeon: Manus Gunning, MD;  Location: [facility];  Service: Gastroenterology;  Laterality: N/A;  . EYE SURGERY  2011   lt eye blled  . MASTECTOMY Right 21 years ago  . TRIGGER FINGER RELEASE Right 06/24/2013   Procedure: RELEASE TRIGGER FINGER/A-1 PULLEY PIP RIGHT LONG FINGER;  Surgeon: Cammie Sickle., MD;  Location: [facility];  Service: Orthopedics;  Laterality: Right;  . TUBAL LIGATION      There were no vitals filed for this visit.      Subjective Assessment - 12/16/16 1454    Subjective Patient reports she is not having hip pain.  Her walking is better.    How long can you walk comfortably?  right legs drags when walking   Diagnostic tests MRI showed 2 torn RTC muscles   Patient Stated Goals reduce stiffness in right hamstring, reach with right shoulder, walk regularly   Currently in Pain? No/denies                         [facility] Adult PT Treatment/Exercise - 12/16/16 0001      Shoulder Exercises: Seated   Flexion 20 reps;Strengthening;AAROM;Right  tactile cues to relax trap and move scapula correctly   Flexion Limitations in front of mirror   Abduction 20 reps;Strengthening;Right;AAROM  tactle cues to relax trap and move scapula correctly   ABduction Limitations in front of mirror   Other Seated Exercises shoulder shrugs moveing through the full range especially downward 20 x in front of mirror; Bilateral shoulder flexion 10x with head movement   Other Seated Exercises sit in front of mirror with PNF pattern D1 and D2 with assistance on correct motion 20x each     Shoulder Exercises: Pulleys   Flexion 2 minutes   ABduction 2 minutes     Manual Therapy   Manual Therapy Soft tissue mobilization;Joint mobilization   Joint Mobilization C6-T5 upslide and down slide glide to improve mobility of shoulder ROM in sitting   Soft tissue mobilization right interscapular area; right upper trap and right posterior axilla  PT Education - 12/16/16 1528    Education provided No          PT Short Term Goals - 12/16/16 1531      PT SHORT TERM GOAL #1   Title independent with initial HEP   Time 4   Period Weeks   Status Achieved     PT SHORT TERM GOAL #2   Title ability to roll in bed with >/= 25% greater ease due to increase right shoulder ROM and strength   Time 4   Period Weeks   Status Achieved     PT SHORT TERM GOAL #3   Title walk with right leg dragging >/= 25% less due to increase mobility of right hip   Time 4   Period Weeks   Status Achieved     PT SHORT TERM GOAL #4   Title right shoulder strength >/= 3/5 making it  easier to reach overhead and behind her back   Period Weeks           PT Long Term Goals - 11/27/16 1534      PT LONG TERM GOAL #1   Title independent with HEP   Time 8   Period Weeks   Status New     PT LONG TERM GOAL #2   Title walk without dragging right leg  after she gets up from a chair   Time 8   Period Weeks   Status New     PT LONG TERM GOAL #3   Title go up and down steps with step over step using a railing due to right hip and knee strength >/= 4/5   Time 8   Period Weeks   Status New     PT LONG TERM GOAL #4   Title right shoulder AROM for flexion and abduction >/= 120 decrease so she is able to reach overhead   Time 8   Period Weeks   Status New     PT LONG TERM GOAL #5   Title roll in bed with >/= 50% greater ease due right shoulder strength >/= 4/5   Time 8   Period Weeks   Status New     Additional Long Term Goals   Additional Long Term Goals Yes     PT LONG TERM GOAL #6   Title FOTO score is </=35% limitation   Time 8   Period Weeks   Status New               Plan - 12/16/16 1528    Clinical Impression Statement Patient had no hip pain.  Her walking has improved.  Patient will contract her upper traps with shoulder movement.  Patient has tightness in C6-T5 restricting shoulder movement.  After releasing the restrictions she was able to move her shoulder with greater ease. Patient does well with exercise in front of mirror to see where she is compensating.  Patient will benefit from skilled therapy to improve right shoulder and hip ROM and strength to restore prior function.    Rehab Potential Good   Clinical Impairments Affecting Rehab Potential s/p right mastectomy 21 years ago; Right breast cancer; Glaucoma   PT Frequency 2x / week   PT Duration 8 weeks   PT Treatment/Interventions Electrical Stimulation;Iontophoresis 4mg /ml Dexamethasone;Moist Heat;Cryotherapy;Patient/family education;Neuromuscular re-education;Therapeutic  exercise;Therapeutic activities;Manual techniques;Passive range of motion;Dry needling;Energy conservation   PT Next Visit Plan shoulder motion with relaxion of the upper trap; incorportate cervical motion with exercise, hip extension with  bridges   PT Home Exercise Plan progress as needed   Consulted and Agree with Plan of Care Patient      Patient will benefit from skilled therapeutic intervention in order to improve the following deficits and impairments:  Decreased mobility, Decreased strength, Pain, Decreased activity tolerance, Decreased endurance, Increased muscle spasms, Decreased range of motion  Visit Diagnosis: Stiffness of right shoulder, not elsewhere classified  Other abnormalities of gait and mobility  Muscle weakness (generalized)     Problem List Patient Active Problem List   Diagnosis Date Noted  . Absolute anemia   . Acute esophagitis   . GI bleed 07/30/2016  . Trigger point of right shoulder region 07/04/2016  . Muscle spasms of neck 06/25/2016  . Right lumbar radiculopathy 01/23/2016  . Arthritis of right acromioclavicular joint 01/02/2016  . Right rotator cuff tear 11/24/2015  . Hamstring tightness of right lower extremity 11/24/2015  . DM 04/03/2009    Earlie Counts, PT 12/16/16 3:32 PM   [facility] [address] [place], [place], [address] Phone: [phone]   Fax:  [phone]  Name: ARISBETH PURRINGTON MRN: [id number] Date of Birth: 10-Aug-1946

## 2016-12-19 ENCOUNTER — Ambulatory Visit (AMBULATORY_SURGERY_CENTER): Payer: Medicare Other | Admitting: Internal Medicine

## 2016-12-19 ENCOUNTER — Encounter: Payer: Self-pay | Admitting: Internal Medicine

## 2016-12-19 VITALS — BP 140/60 | HR 59 | Temp 97.3°F | Resp 11 | Ht 64.0 in | Wt 164.0 lb

## 2016-12-19 DIAGNOSIS — K2100 Gastro-esophageal reflux disease with esophagitis, without bleeding: Secondary | ICD-10-CM

## 2016-12-19 DIAGNOSIS — K2270 Barrett's esophagus without dysplasia: Secondary | ICD-10-CM

## 2016-12-19 DIAGNOSIS — K222 Esophageal obstruction: Secondary | ICD-10-CM

## 2016-12-19 LAB — GLUCOSE, CAPILLARY
GLUCOSE-CAPILLARY: 98 mg/dL (ref 65–99)
Glucose-Capillary: 116 mg/dL — ABNORMAL HIGH (ref 65–99)

## 2016-12-19 MED ORDER — SODIUM CHLORIDE 0.9 % IV SOLN: 500.0000 mL | INTRAVENOUS | Status: DC

## 2016-12-19 MED ORDER — OMEPRAZOLE 40 MG PO CPDR: 40.0000 mg | DELAYED_RELEASE_CAPSULE | Freq: Every day | ORAL | 11 refills | Status: DC

## 2016-12-19 NOTE — Patient Instructions (Signed)
YOU HAD AN ENDOSCOPIC PROCEDURE TODAY AT [facility]:   Refer to the procedure report that was given to you for any specific questions about what was found during the examination.  If the procedure report does not answer your questions, please call your gastroenterologist to clarify.  If you requested that your care partner not be given the details of your procedure findings, then the procedure report has been included in a sealed envelope for you to review at your convenience later.  YOU SHOULD EXPECT: Some feelings of bloating in the abdomen. Passage of more gas than usual.  Walking can help get rid of the air that was put into your GI tract during the procedure and reduce the bloating. If you had a lower endoscopy (such as a colonoscopy or flexible sigmoidoscopy) you may notice spotting of blood in your stool or on the toilet paper. If you underwent a bowel prep for your procedure, you may not have a normal bowel movement for a few days.  Please Note:  You might notice some irritation and congestion in your nose or some drainage.  This is from the oxygen used during your procedure.  There is no need for concern and it should clear up in a day or so.  SYMPTOMS TO REPORT IMMEDIATELY:    Following upper endoscopy (EGD)  Vomiting of blood or coffee ground material  New chest pain or pain under the shoulder blades  Painful or persistently difficult swallowing  New shortness of breath  Fever of 100F or higher  Black, tarry-looking stools  For urgent or emergent issues, a gastroenterologist can be reached at any hour by calling [phone].   DIET:  We do recommend a small meal at first, but then you may proceed to your regular diet.  Drink plenty of fluids but you should avoid alcoholic beverages for 24 hours.  ACTIVITY:  You should plan to take it easy for the rest of today and you should NOT DRIVE or use heavy machinery until tomorrow (because of the sedation medicines used  during the test).    FOLLOW UP: Our staff will call the number listed on your records the next business day following your procedure to check on you and address any questions or concerns that you may have regarding the information given to you following your procedure. If we do not reach you, we will leave a message.  However, if you are feeling well and you are not experiencing any problems, there is no need to return our call.  We will assume that you have returned to your regular daily activities without incident.  If any biopsies were taken you will be contacted by phone or by letter within the next 1-3 weeks.  Please call us at [phone] if you have not heard about the biopsies in 3 weeks.    SIGNATURES/CONFIDENTIALITY: You and/or your care partner have signed paperwork which will be entered into your electronic medical record.  These signatures attest to the fact that that the information above on your After Visit Summary has been reviewed and is understood.  Full responsibility of the confidentiality of this discharge information lies with you and/or your care-partner.  Resume medications. Information given on Hiatal Hernia and Esophagitis.

## 2016-12-19 NOTE — Progress Notes (Signed)
Patient awakening,vss,report to rn 

## 2016-12-19 NOTE — Op Note (Signed)
[place] Patient Name: Teresa Barnes Procedure Date: 12/19/2016 11:02 AM MRN: [id number] Endoscopist: Docia Chuck. Henrene Pastor , MD Age: 71 Referring MD:  Date of Birth: 11/06/1946 Gender: Female Account #: [id number] Procedure:                Upper GI endoscopy Indications:              Dysphagia, Esophageal reflux. had GI bleed                            in-hospital. Upper endoscopy with Dr. Havery Moros in                            September revealed severe erosive esophagitis with                            questionable Barrett's. Patient was to have been on                            PPI but has not been. She is still experiencing                            active reflux symptoms and dysphagia Medicines:                Monitored Anesthesia Care Procedure:                Pre-Anesthesia Assessment:                           - Prior to the procedure, a History and Physical                            was performed, and patient medications and                            allergies were reviewed. The patient's tolerance of                            previous anesthesia was also reviewed. The risks                            and benefits of the procedure and the sedation                            options and risks were discussed with the patient.                            All questions were answered, and informed consent                            was obtained. Prior Anticoagulants: The patient has                            taken no previous anticoagulant or antiplatelet  agents. ASA Grade Assessment: II - A patient with                            mild systemic disease. After reviewing the risks                            and benefits, the patient was deemed in                            satisfactory condition to undergo the procedure.                           After obtaining informed consent, the endoscope was                            passed under direct  vision. Throughout the                            procedure, the patient's blood pressure, pulse, and                            oxygen saturations were monitored continuously. The                            Model GIF-HQ190 [id number]) scope was introduced                            through the mouth, and advanced to the second part                            of duodenum. The upper GI endoscopy was                            accomplished without difficulty. The patient                            tolerated the procedure well. Scope In: Scope Out: Findings:                 LA Grade D (one or more mucosal breaks involving at                            least 75% of esophageal circumference) esophagitis                            was found.                           One moderate benign-appearing, intrinsic stenosis                            was found. This measured 1.4 cm (inner diameter)                            and was traversed.  A 5 cm hiatal hernia was present.                           The exam of the stomach was otherwise normal.                           The examined duodenum was normal. Complications:            No immediate complications. Estimated Blood Loss:     Estimated blood loss: none. Impression:               - LA Grade D reflux esophagitis.                           - Benign-appearing esophageal stenosis.                           - 5 cm hiatal hernia.                           - Normal examined duodenum.                           - No specimens collected. Recommendation:           - Prescribe omeprazole 40 mg; #30; 1 by mouth                            daily; 11 refills. Begin this today.                           - Resume previous diet.                           - Continue other medications.                           - Repeat upper endoscopy in 8 weeks to evaluate the                            response to therapy and dilate esophageal  stricture. Docia Chuck. Henrene Pastor, MD 12/19/2016 11:27:10 AM This report has been signed electronically.

## 2016-12-20 ENCOUNTER — Telehealth: Payer: Self-pay

## 2016-12-20 NOTE — Telephone Encounter (Signed)
  Follow up Call-  Call back number 12/19/2016 06/15/2014  Post procedure Call Back phone  # [phone]  Permission to leave phone message Yes Yes  Some recent data might be hidden     Patient questions:  Do you have a fever, pain , or abdominal swelling? No. Pain Score  0 *  Have you tolerated food without any problems? Yes.    Have you been able to return to your normal activities? Yes.    Do you have any questions about your discharge instructions: Diet   No. Medications  No. Follow up visit  No.  Do you have questions or concerns about your Care? No.  Actions: * If pain score is 4 or above: No action needed, pain <4.

## 2016-12-23 ENCOUNTER — Ambulatory Visit
Admission: RE | Admit: 2016-12-23 | Discharge: 2016-12-23 | Disposition: A | Discharge status: Home / Self Care | Payer: Medicare Other | Source: Ambulatory Visit | Attending: Family Medicine | Admitting: Family Medicine

## 2016-12-23 DIAGNOSIS — Z1231 Encounter for screening mammogram for malignant neoplasm of breast: Secondary | ICD-10-CM

## 2016-12-25 ENCOUNTER — Ambulatory Visit: Payer: Medicare Other | Attending: Physical Medicine & Rehabilitation | Admitting: Physical Therapy

## 2016-12-25 DIAGNOSIS — M25651 Stiffness of right hip, not elsewhere classified: Secondary | ICD-10-CM | POA: Insufficient documentation

## 2016-12-25 DIAGNOSIS — R2689 Other abnormalities of gait and mobility: Secondary | ICD-10-CM | POA: Insufficient documentation

## 2016-12-25 DIAGNOSIS — M6281 Muscle weakness (generalized): Secondary | ICD-10-CM | POA: Insufficient documentation

## 2016-12-25 DIAGNOSIS — M25611 Stiffness of right shoulder, not elsewhere classified: Secondary | ICD-10-CM | POA: Insufficient documentation

## 2017-01-01 ENCOUNTER — Ambulatory Visit: Payer: Medicare Other | Admitting: Physical Therapy

## 2017-01-01 ENCOUNTER — Encounter: Payer: Self-pay | Admitting: Physical Therapy

## 2017-01-01 DIAGNOSIS — M25611 Stiffness of right shoulder, not elsewhere classified: Secondary | ICD-10-CM

## 2017-01-01 DIAGNOSIS — M25651 Stiffness of right hip, not elsewhere classified: Secondary | ICD-10-CM

## 2017-01-01 DIAGNOSIS — M6281 Muscle weakness (generalized): Secondary | ICD-10-CM

## 2017-01-01 DIAGNOSIS — R2689 Other abnormalities of gait and mobility: Secondary | ICD-10-CM

## 2017-01-01 NOTE — Therapy (Signed)
[facility] [address] [place], [place], [address] Phone: [phone]   Fax:  [phone]  Physical Therapy Treatment  Patient Details  Name: Teresa Barnes MRN: [id number] Date of Birth: 1946-04-10 Referring Provider: Dr. Olevia Bowens. Teresa Barnes  Encounter Date: 01/01/2017      PT End of Session - 01/01/17 1418    Visit Number 6   Number of Visits 10   Date for PT Re-Evaluation 01/22/17   Authorization Type medicare g-code on 10th visit   PT Start Time 1404   PT Stop Time 1442   PT Time Calculation (min) 38 min   Activity Tolerance Patient tolerated treatment well   Behavior During Therapy WFL for tasks assessed/performed      Past Medical History:  Diagnosis Date  . Anemia   . Blood transfusion without reported diagnosis 07/2016  . Cancer ([place]) 1991   breast  . Diabetes mellitus without complication ([place])   . GERD (gastroesophageal reflux disease)   . Glaucoma   . Hyperlipidemia   . Hypertension   . Wears glasses     Past Surgical History:  Procedure Laterality Date  . CATARACT EXTRACTION W/ INTRAOCULAR LENS  IMPLANT, BILATERAL  2000  . COLONOSCOPY    . ESOPHAGOGASTRODUODENOSCOPY (EGD) WITH PROPOFOL N/A 07/31/2016   Procedure: ESOPHAGOGASTRODUODENOSCOPY (EGD) WITH PROPOFOL;  Surgeon: Manus Gunning, MD;  Location: [facility];  Service: Gastroenterology;  Laterality: N/A;  . EYE SURGERY  2011   lt eye blled  . MASTECTOMY Right 21 years ago  . TRIGGER FINGER RELEASE Right 06/24/2013   Procedure: RELEASE TRIGGER FINGER/A-1 PULLEY PIP RIGHT LONG FINGER;  Surgeon: Cammie Sickle., MD;  Location: [facility];  Service: Orthopedics;  Laterality: Right;  . TUBAL LIGATION      There were no vitals filed for this visit.      Subjective Assessment - 01/01/17 1406    Subjective MD was happy with my ROM.  I have a cold today so I may not tolerate exercise.    How long can you walk  comfortably? right legs drags when walking   Diagnostic tests MRI showed 2 torn RTC muscles   Patient Stated Goals reduce stiffness in right hamstring, reach with right shoulder, walk regularly   Multiple Pain Sites No            [facility] PT Assessment - 01/01/17 0001      Assessment   Medical Diagnosis M75.101 tear of right rotator cuff, unspecified tear extent; Y22.336P Strain of right hamstring   Referring Provider Dr. Olevia Bowens. [place]   Onset Date/Surgical Date 05/18/16   Hand Dominance Right   Prior Therapy None     Precautions   Precautions Other (comment)   Precaution Comments s/p right breast cancer; no UBE; needs head elevated due to haital hernia     Restrictions   Weight Bearing Restrictions No     Balance Screen   Has the patient fallen in the past 6 months No   Has the patient had a decrease in activity level because of a fear of falling?  No   Is the patient reluctant to leave their home because of a fear of falling?  No     Home Environment   Living Environment Private residence   Type of [place] Access Stairs to enter   Entrance Stairs-Number of Steps 2   Entrance Stairs-Rails Left;Right   Home Layout Two level  Prior Function   Level of Independence Independent   Vocation Retired     Charity fundraiser Status Within Functional Limits for tasks assessed     Observation/Other Assessments   Focus on Therapeutic Outcomes (FOTO)  42% limitation     Posture/Postural Control   Posture/Postural Control No significant limitations     AROM   Right Shoulder Extension 50 Degrees   Right Shoulder Flexion 122 Degrees   Right Shoulder ABduction 105 Degrees   Right Shoulder Internal Rotation 70 Degrees   Right Shoulder External Rotation 80 Degrees     PROM   Right Shoulder Flexion 130 Degrees   Right Shoulder ABduction 140 Degrees   Right Shoulder Internal Rotation 80 Degrees   Right Shoulder External Rotation 90 Degrees      Strength   Right Shoulder Flexion 3/5   Right Shoulder Extension 5/5   Right Shoulder ABduction 3/5   Right Shoulder Internal Rotation 4/5   Right Shoulder External Rotation 4/5   Right Hip Extension 4-/5   Right Hip ABduction 4-/5   Right Knee Flexion 5/5     Ambulation/Gait   Ambulation/Gait No                     [facility] Adult PT Treatment/Exercise - 01/01/17 0001      Knee/Hip Exercises: Aerobic   Nustep level 1 15 min  while discussing patient HEP and progress     Shoulder Exercises: Pulleys   Flexion 3 minutes   ABduction 3 minutes                PT Education - 01/01/17 1425    Education provided Yes   Education Details Verbally reviewed HEP verbally due to her feeling too weak to do too much activity   Person(s) Educated Patient   Methods Explanation   Comprehension Verbalized understanding          PT Short Term Goals - 12/16/16 1531      PT SHORT TERM GOAL #1   Title independent with initial HEP   Time 4   Period Weeks   Status Achieved     PT SHORT TERM GOAL #2   Title ability to roll in bed with >/= 25% greater ease due to increase right shoulder ROM and strength   Time 4   Period Weeks   Status Achieved     PT SHORT TERM GOAL #3   Title walk with right leg dragging >/= 25% less due to increase mobility of right hip   Time 4   Period Weeks   Status Achieved     PT SHORT TERM GOAL #4   Title right shoulder strength >/= 3/5 making it easier to reach overhead and behind her back   Period Weeks           PT Long Term Goals - 01/01/17 1407      PT LONG TERM GOAL #1   Title independent with HEP   Time 8   Period Weeks   Status Achieved     PT LONG TERM GOAL #2   Title walk without dragging right leg  after she gets up from a chair   Time 8   Period Weeks   Status Not Met  50% better and no pain     PT LONG TERM GOAL #3   Title go up and down steps with step over step using a railing due to right hip and knee  strength >/= 4/5   Time 8   Period Weeks   Status Achieved     PT LONG TERM GOAL #4   Title right shoulder AROM for flexion and abduction >/= 120 decrease so she is able to reach overhead   Time 8   Period Weeks   Status Achieved     PT LONG TERM GOAL #5   Title roll in bed with >/= 50% greater ease due right shoulder strength >/= 4/5   Time 8   Period Weeks   Status Achieved     PT LONG TERM GOAL #6   Title FOTO score is </=35% limitation   Time 8   Period Weeks   Status Not Met               Plan - 01-26-17 1407    Clinical Impression Statement Patient has increased strength of right shoulder and hip.  Patient has increased rigth shoulder ROM.  Patient FOTO improved from 47% to 42% limitation. Patient understands her HEP.  Patient  wants to be discharged  because she is happy with her progress.  Patient reports she will drag her right leg sporadically. Patient able to go up and down stairs with step over step pattern. Patient will roll to left side due to right shoulder has some pain.    Rehab Potential Good   Clinical Impairments Affecting Rehab Potential s/p right mastectomy 21 years ago; Right breast cancer; Glaucoma   PT Frequency 2x / week   PT Duration 8 weeks   PT Treatment/Interventions Electrical Stimulation;Iontophoresis 12m/ml Dexamethasone;Moist Heat;Cryotherapy;Patient/family education;Neuromuscular re-education;Therapeutic exercise;Therapeutic activities;Manual techniques;Passive range of motion;Dry needling;Energy conservation   PT [place]   Consulted and Agree with Plan of Care Patient      Patient will benefit from skilled therapeutic intervention in order to improve the following deficits and impairments:  Decreased mobility, Decreased strength, Pain, Decreased activity tolerance, Decreased endurance, Increased muscle spasms, Decreased range of motion  Visit Diagnosis: Stiffness of  right shoulder, not elsewhere classified  Muscle weakness (generalized)  Other abnormalities of gait and mobility  Stiffness of right hip, not elsewhere classified       G-Codes - 02018/03/111439    Functional Assessment Tool Used FOTO score is 42% limitation   Functional Limitation Carrying, moving and handling objects   Carrying, Moving and Handling Objects Goal Status (([id number] At least 20 percent but less than 40 percent impaired, limited or restricted   Carrying, Moving and Handling Objects Discharge Status ([id number] At least 40 percent but less than 60 percent impaired, limited or restricted      Problem List Patient Active Problem List   Diagnosis Date Noted  . Absolute anemia   . Acute esophagitis   . GI bleed 07/30/2016  . Trigger point of right shoulder region 07/04/2016  . Muscle spasms of neck 06/25/2016  . Right lumbar radiculopathy 01/23/2016  . Arthritis of right acromioclavicular joint 011-Mar-2017 . Right rotator cuff tear 11/24/2015  . Hamstring tightness of right lower extremity 11/24/2015  . DM 04/03/2009    CEarlie Counts PT 0March 11, 20182:43 PM   [facility] [address] [place] [address]Phone: [phone]  Fax:  [phone] Name: SZALAYA ASTARITAMRN: [id number] of Birth: 1Aug 17, 1947 PHYSICAL THERAPY DISCHARGE SUMMARY  Visits from Start of Care: 6  Current functional level related to  goals / functional outcomes: See above.    Remaining deficits: See above.   Education / Equipment: HEP Plan: Patient agrees to discharge.  Patient goals were partially met. Patient is being discharged due to being pleased with the current functional level.  Thank you for the referral. Earlie Counts, PT 01/01/17 2:43 PM  ?????

## 2017-01-30 ENCOUNTER — Ambulatory Visit (AMBULATORY_SURGERY_CENTER): Payer: Self-pay | Admitting: *Deleted

## 2017-01-30 VITALS — Ht 64.0 in | Wt 166.4 lb

## 2017-01-30 DIAGNOSIS — R1310 Dysphagia, unspecified: Secondary | ICD-10-CM

## 2017-01-30 NOTE — Progress Notes (Signed)
Pt denies allergies to eggs or soy products. Denies difficulty with sedation or anesthesia. Denies any diet or weight loss medications. Denies use of supplemental oxygen.  Emmi instructions refused by patient.

## 2017-01-31 ENCOUNTER — Encounter: Payer: Self-pay | Admitting: Internal Medicine

## 2017-02-03 NOTE — Progress Notes (Signed)
[name] [place], [place] [address] Phone: [phone] Subjective:    I'm seeing this patient by the request  of:  Foye Spurling, MD  CC:  Neck pain f/u Right shoulder pain follow-up  SEG:[id number]  Teresa Barnes is a 71 y.o. female coming in with complaint of  Right-sided neck pain. PAtient was found to have RTC tear as well. Was doing well with PT and HEP.  Patient states Was doing better than the right shoulder injection 11/14/2016. This is the last time we have seen patient. Patient states overall is doing better. States that she has notice me increasing range of motion of the shoulder at this time. Patient feel she has done relatively well. Stas some weakness compared to the coral side.   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 Patient did have a full neck thickness tear of the rotator cuff and mild degenerative changes. Patient does have superior labral degenerative changes as well.  Past Medical History:  Diagnosis Date  . Anemia   . Blood transfusion without reported diagnosis 07/2016  . Cancer ([place]) 1991   breast  . Diabetes mellitus without complication ([place])   . GERD (gastroesophageal reflux disease)   . Glaucoma   . Hyperlipidemia   . Hypertension   . Wears glasses    Past Surgical History:  Procedure Laterality Date  . CATARACT EXTRACTION W/ INTRAOCULAR LENS  IMPLANT, BILATERAL  2000  . COLONOSCOPY    . cyst on spine    . ESOPHAGOGASTRODUODENOSCOPY (EGD) WITH PROPOFOL N/A 07/31/2016   Procedure: ESOPHAGOGASTRODUODENOSCOPY (EGD) WITH PROPOFOL;  Surgeon: Manus Gunning, MD;  [facility];  Service: Gastroenterology;  Laterality: N/A;  . EYE SURGERY  2011   lt eye blled  . MASTECTOMY Right 21 years ago  . TRIGGER FINGER RELEASE Right 06/24/2013   Procedure: RELEASE TRIGGER  FINGER/A-1 PULLEY PIP RIGHT LONG FINGER;  Surgeon: Cammie Sickle., MD;  Location: [facility];  Service: Orthopedics;  Laterality: Right;  . TUBAL LIGATION     Social History   Social History  . Marital status: Married    Spouse name: N/A  . Number of children: N/A  . Years of education: N/A   Occupational History  . retired    Social History Main Topics  . Smoking status: Former Smoker    Quit date: 06/18/1993  . Smokeless tobacco: Never Used  . Alcohol use No  . Drug use: No  . Sexual activity: Not on file   Other Topics Concern  . Not on file   Social History Narrative  . No narrative on file   No Known Allergies Family History  Problem Relation Age of Onset  . Diabetes Mother   . Diabetes Daughter   . Diabetes Son   . Colon cancer Neg Hx     Past medical history, social, surgical and family history all reviewed in electronic medical record.  No pertanent information unless stated regarding to the chief complaint.   Review of Systems: No headache, visual changes, nausea, vomiting, diarrhea, constipation, dizziness, abdominal pain, skin rash, fevers, chills, night sweats, weight loss, swollen lymph nodes, body aches, joint swelling, muscle aches, chest pain, shortness of breath, mood changes.     Objective  There were no vitals taken for this visit.  Systems examined below as of 02/04/17  General: NAD A&O x3 mood, affect normal  HEENT: Pupils equal, extraocular movements intact no nystagmus Respiratory: not short of breath at rest or with speaking Cardiovascular: No lower extremity edema, non tender Skin: Warm dry intact with no signs of infection or rash on extremities or on axial skeleton. Abdomen: Soft nontender, no masses Neuro: Cranial nerves  intact, neurovascularly intact in all extremities with 2+ DTRs and 2+ pulses. Lymph: No lymphadenopathy appreciated today  Gait normal with good balance and coordination.  MSK: Non tender with full  range of motion and good stability and symmetric strength and tone of , elbows, wrist,  knee hips and ankles bilaterally.    Neck: Inspection unremarkable. No palpable stepoffs. Negative Spurling's maneuver.Mild limitation in right-sided rotation and left-sided side bending  Grip strength and sensation normal in bilateral hands Strength good C4 to T1 distribution No sensory change to C4 to T1 Negative Hoffman sign bilaterally Reflexes normal Shoulder: Right Inspection reveals no abnormalities, atrophy or asymmetry. Palpation is normal with no tenderness over AC joint or bicipital groove. Mild lack of forward flexion of the last 10. Rotator cuff strength 4-5 compared to the contralateral side No signs of impingement with negative Neer and Hawkin's tests, empty can sign. Speeds and Yergason's tests normal. No labral pathology noted with negative Obrien's, negative clunk and good stability. Normal scapular function observed. Mild positive painful arc         Impression and Recommendations:     This case required medical decision making of moderate complexity.

## 2017-02-04 ENCOUNTER — Encounter: Payer: Self-pay | Admitting: Family Medicine

## 2017-02-04 ENCOUNTER — Ambulatory Visit (INDEPENDENT_AMBULATORY_CARE_PROVIDER_SITE_OTHER): Payer: Medicare Other | Admitting: Family Medicine

## 2017-02-04 DIAGNOSIS — M75101 Unspecified rotator cuff tear or rupture of right shoulder, not specified as traumatic: Secondary | ICD-10-CM

## 2017-02-04 NOTE — Assessment & Plan Note (Signed)
Continues to present length patient does have a rotator cuff tear. Does have weakness compared to the contralateral side. Differential includes a cervical radiculopathy. We discussed the possibility of an MRI of the neck and epidurals patient declined. Patient about the idea of another injection but with patient not having any pain and only some weakness I do not feel he would be beneficial at this time. Encourage her to continue to do conservative therapy. Discussed icing regimen. Patient will come back and see me again as needed.

## 2017-02-11 ENCOUNTER — Ambulatory Visit (AMBULATORY_SURGERY_CENTER): Payer: Medicare Other | Admitting: Internal Medicine

## 2017-02-11 ENCOUNTER — Encounter: Payer: Self-pay | Admitting: Internal Medicine

## 2017-02-11 VITALS — BP 109/63 | HR 61 | Temp 97.1°F | Resp 17 | Ht 64.0 in | Wt 166.0 lb

## 2017-02-11 DIAGNOSIS — K222 Esophageal obstruction: Secondary | ICD-10-CM

## 2017-02-11 DIAGNOSIS — K2100 Gastro-esophageal reflux disease with esophagitis, without bleeding: Secondary | ICD-10-CM

## 2017-02-11 DIAGNOSIS — R1310 Dysphagia, unspecified: Secondary | ICD-10-CM | POA: Diagnosis present

## 2017-02-11 MED ORDER — SODIUM CHLORIDE 0.9 % IV SOLN: 500.0000 mL | INTRAVENOUS | Status: DC

## 2017-02-11 NOTE — Patient Instructions (Signed)
YOU HAD AN ENDOSCOPIC PROCEDURE TODAY AT [facility]:   Refer to the procedure report that was given to you for any specific questions about what was found during the examination.  If the procedure report does not answer your questions, please call your gastroenterologist to clarify.  If you requested that your care partner not be given the details of your procedure findings, then the procedure report has been included in a sealed envelope for you to review at your convenience later.  YOU SHOULD EXPECT: Some feelings of bloating in the abdomen. Passage of more gas than usual.  Walking can help get rid of the air that was put into your GI tract during the procedure and reduce the bloating. If you had a lower endoscopy (such as a colonoscopy or flexible sigmoidoscopy) you may notice spotting of blood in your stool or on the toilet paper. If you underwent a bowel prep for your procedure, you may not have a normal bowel movement for a few days.  Please Note:  You might notice some irritation and congestion in your nose or some drainage.  This is from the oxygen used during your procedure.  There is no need for concern and it should clear up in a day or so.  SYMPTOMS TO REPORT IMMEDIATELY:     Following upper endoscopy (EGD)  Vomiting of blood or coffee ground material  New chest pain or pain under the shoulder blades  Painful or persistently difficult swallowing  New shortness of breath  Fever of 100F or higher  Black, tarry-looking stools  For urgent or emergent issues, a gastroenterologist can be reached at any hour by calling [phone].   DIET:  FOLLOW POST DILATATION DIET TODAY  ACTIVITY:  You should plan to take it easy for the rest of today and you should NOT DRIVE or use heavy machinery until tomorrow (because of the sedation medicines used during the test).    FOLLOW UP: Our staff will call the number listed on your records the next business day following your  procedure to check on you and address any questions or concerns that you may have regarding the information given to you following your procedure. If we do not reach you, we will leave a message.  However, if you are feeling well and you are not experiencing any problems, there is no need to return our call.  We will assume that you have returned to your regular daily activities without incident.  If any biopsies were taken you will be contacted by phone or by letter within the next 1-3 weeks.  Please call us at [phone] if you have not heard about the biopsies in 3 weeks.    SIGNATURES/CONFIDENTIALITY: You and/or your care partner have signed paperwork which will be entered into your electronic medical record.  These signatures attest to the fact that that the information above on your After Visit Summary has been reviewed and is understood.  Full responsibility of the confidentiality of this discharge information lies with you and/or your care-partner.   POST DILATATION DIET HANDOUT GIVEN TO YOU TODAY,FOLLOW THIS DIET TODAY

## 2017-02-11 NOTE — Progress Notes (Signed)
Called to room to assist during endoscopic procedure.  Patient ID and intended procedure confirmed with present staff. Received instructions for my participation in the procedure from the performing physician.  

## 2017-02-11 NOTE — Op Note (Signed)
[facility] Patient Name: Teresa Barnes Procedure Date: 02/11/2017 10:29 AM MRN: [id number] Endoscopist: Docia Chuck. Henrene Pastor , MD Age: 71 Referring MD:  Date of Birth: April 30, 1946 Gender: Female Account #: [id number] Procedure:                Upper GI endoscopy, with balloon dilation of the                            esophagus Indications:              Dysphagia.Evaluate erosive esophagitis Medicines:                Monitored Anesthesia Care Procedure:                Pre-Anesthesia Assessment:                           - Prior to the procedure, a History and Physical                            was performed, and patient medications and                            allergies were reviewed. The patient's tolerance of                            previous anesthesia was also reviewed. The risks                            and benefits of the procedure and the sedation                            options and risks were discussed with the patient.                            All questions were answered, and informed consent                            was obtained. Prior Anticoagulants: The patient has                            taken no previous anticoagulant or antiplatelet                            agents. ASA Grade Assessment: II - A patient with                            mild systemic disease. After reviewing the risks                            and benefits, the patient was deemed in                            satisfactory condition to undergo the procedure.  After obtaining informed consent, the endoscope was                            passed under direct vision. Throughout the                            procedure, the patient's blood pressure, pulse, and                            oxygen saturations were monitored continuously. The                            Endoscope was introduced through the mouth, and                            advanced to the second part  of duodenum. The upper                            GI endoscopy was accomplished without difficulty.                            The patient tolerated the procedure well. Scope In: Scope Out: Findings:                 One mild benign-appearing, intrinsic stenosis was                            found 35 cm from the incisors. This measured 1.5 cm                            (inner diameter) and was traversed. A TTS dilator                            was passed through the scope. Dilation with an                            18-19-20 mm balloon dilator was performed to 20 mm.                            The dilation site was examined following endoscope                            reinsertion and showed moderate improvement in                            luminal narrowing.                           The exam of the esophagus was otherwise normal.                            Previous erosive change has healed.  A 5 cm hiatal hernia was present.                           The exam of the stomach was otherwise normal.                           The examined duodenum was normal.                           The cardia and gastric fundus were normal on                            retroflexion. Complications:            No immediate complications. Estimated Blood Loss:     Estimated blood loss: none. Impression:               - Benign-appearing esophageal stenosis. Dilated.                           - 5 cm hiatal hernia.                           - Normal examined duodenum.                           - No specimens collected. Recommendation:           - Patient has a contact number available for                            emergencies. The signs and symptoms of potential                            delayed complications were discussed with the                            patient. Return to normal activities tomorrow.                            Written discharge instructions were provided to  the                            patient.                           - Post dilation diet.                           - Continue present medications.                           - Scheduled routine office follow-up with Dr. Henrene Pastor                            in about 3 months Docia Chuck. Henrene Pastor, MD 02/11/2017 10:59:53 AM This report has been signed electronically.

## 2017-02-11 NOTE — Progress Notes (Signed)
TO PACU VSS, Report to RN.

## 2017-02-12 ENCOUNTER — Telehealth: Payer: Self-pay

## 2017-02-12 NOTE — Telephone Encounter (Signed)
  Follow up Call-  Call back number 02/11/2017 12/19/2016 06/15/2014  Post procedure Call Back phone  # [phone]  Permission to leave phone message Yes Yes Yes  Some recent data might be hidden     Patient questions:  Do you have a fever, pain , or abdominal swelling? No. Pain Score  0 *  Have you tolerated food without any problems? Yes.    Have you been able to return to your normal activities? Yes.    Do you have any questions about your discharge instructions: Diet   No. Medications  No. Follow up visit  No.  Do you have questions or concerns about your Care? No.  Actions: * If pain score is 4 or above: No action needed, pain <4.  No problems noted per pt. maw

## 2017-05-09 ENCOUNTER — Ambulatory Visit (INDEPENDENT_AMBULATORY_CARE_PROVIDER_SITE_OTHER): Payer: Medicare Other | Admitting: Internal Medicine

## 2017-05-09 ENCOUNTER — Encounter: Payer: Self-pay | Admitting: Internal Medicine

## 2017-05-09 ENCOUNTER — Ambulatory Visit: Payer: Medicare Other | Admitting: Internal Medicine

## 2017-05-09 ENCOUNTER — Encounter (INDEPENDENT_AMBULATORY_CARE_PROVIDER_SITE_OTHER): Payer: Self-pay

## 2017-05-09 VITALS — BP 90/62 | HR 60 | Ht 64.0 in | Wt 169.0 lb

## 2017-05-09 DIAGNOSIS — K2100 Gastro-esophageal reflux disease with esophagitis, without bleeding: Secondary | ICD-10-CM

## 2017-05-09 DIAGNOSIS — K222 Esophageal obstruction: Secondary | ICD-10-CM | POA: Diagnosis not present

## 2017-05-09 DIAGNOSIS — R1310 Dysphagia, unspecified: Secondary | ICD-10-CM

## 2017-05-09 NOTE — Progress Notes (Signed)
HISTORY OF PRESENT ILLNESS:  Teresa Barnes is a 71 y.o. female with past medical history as listed below who presents today for post endoscopy follow-up regarding management of GERD, complicated by erosive esophagitis and esophageal stricture. Patient had upper GI bleeding for which she underwent upper endoscopy in the hospital with Dr. Havery Moros September 2017. She was found to have severe esophagitis and placed on PPI and Carafate. She was set up to see me in follow-up and underwent follow-up upper endoscopy in February 2018. Unfortunately, she had been off her PPI. She was again found to have erosive esophagitis (grade D) and peptic stricture. She was placed on omeprazole 40 mg daily and about 6-8 weeks later (02/11/2017 underwent repeat upper endoscopy. Previously noted severe esophagitis had healed. No Barrett's. The distal esophageal stricture was dilated with a balloon up to 20 mm. She does have a moderately large hiatal hernia. She presents today for follow-up. She has been compliant with omeprazole 40 mg daily. She is delighted to report that she has had absolutely no reflux symptoms or recurrent dysphagia. No medication side effects. She did have several questions. GI review of systems is otherwise negative. Her last colonoscopy was in 2015. This was normal  REVIEW OF SYSTEMS:  All non-GI ROS negative thus otherwise stated in the history of present illness   Past Medical History:  Diagnosis Date  . Anemia   . Blood transfusion without reported diagnosis 07/2016  . Cancer ([place]) 1991   breast  . Diabetes mellitus without complication ([place])   . GERD (gastroesophageal reflux disease)   . Glaucoma   . Hyperlipidemia   . Hypertension   . Wears glasses     Past Surgical History:  Procedure Laterality Date  . CATARACT EXTRACTION W/ INTRAOCULAR LENS  IMPLANT, BILATERAL  2000  . COLONOSCOPY    . cyst on spine    . ESOPHAGOGASTRODUODENOSCOPY (EGD) WITH PROPOFOL N/A 07/31/2016   Procedure:  ESOPHAGOGASTRODUODENOSCOPY (EGD) WITH PROPOFOL;  Surgeon: Manus Gunning, MD;  [facility];  Service: Gastroenterology;  Laterality: N/A;  . EYE SURGERY  2011   lt eye blled  . MASTECTOMY Right 21 years ago  . TRIGGER FINGER RELEASE Right 06/24/2013   Procedure: RELEASE TRIGGER FINGER/A-1 PULLEY PIP RIGHT LONG FINGER;  Surgeon: Cammie Sickle., MD;  Location: [facility];  Service: Orthopedics;  Laterality: Right;  . TUBAL LIGATION      Social History Teresa Barnes  reports that she quit smoking about 23 years ago. She has never used smokeless tobacco. She reports that she does not drink alcohol or use drugs.  family history includes Diabetes in her daughter, mother, and son.  No Known Allergies     PHYSICAL EXAMINATION: Vital signs: BP 90/62   Pulse 60   Ht 5\' 4"  (1.626 m)   Wt 169 lb (76.7 kg)   BMI 29.01 kg/m   Constitutional: generally well-appearing, no acute distress Psychiatric: alert and oriented x3, cooperative Eyes: extraocular movements intact, anicteric, conjunctiva pink Mouth: oral pharynx moist, no lesions Neck: supple no lymphadenopathy Cardiovascular: heart regular rate and rhythm, no murmur Lungs: clear to auscultation bilaterally Abdomen: soft, nontender, nondistended, no obvious ascites, no peritoneal signs, normal bowel sounds, no organomegaly Rectal: Omitted Extremities: no clubbing cyanosis or lower extremity edema bilaterally Skin: no lesions on visible extremities Neuro: No focal deficits. Cranial nerves intact  ASSESSMENT:  #1. GERD, complicated by severe erosive esophagitis and peptic stricture. Currently asymptomatic post dilation on PPI #2.  Normal colonoscopy 2015   PLAN:  #1. Reflux precautions #2. Continue omeprazole 40 mg daily #3. Routine office follow-up one year #4. Repeat screening colonoscopy to be considered around 2025  15 minutes spent face-to-face with the patient. Greater than 50% a time was  use for counseling regarding her complicated GERD, its medical therapy, and the role for repeat endoscopy moving forward

## 2017-05-09 NOTE — Patient Instructions (Signed)
Please follow up in one year 

## 2017-07-02 ENCOUNTER — Ambulatory Visit (INDEPENDENT_AMBULATORY_CARE_PROVIDER_SITE_OTHER): Payer: Medicare Other | Admitting: Ophthalmology

## 2017-07-02 DIAGNOSIS — E113593 Type 2 diabetes mellitus with proliferative diabetic retinopathy without macular edema, bilateral: Secondary | ICD-10-CM | POA: Diagnosis not present

## 2017-07-02 DIAGNOSIS — H43813 Vitreous degeneration, bilateral: Secondary | ICD-10-CM

## 2017-07-02 DIAGNOSIS — E11319 Type 2 diabetes mellitus with unspecified diabetic retinopathy without macular edema: Secondary | ICD-10-CM | POA: Diagnosis not present

## 2017-07-02 DIAGNOSIS — H338 Other retinal detachments: Secondary | ICD-10-CM

## 2017-07-02 DIAGNOSIS — I10 Essential (primary) hypertension: Secondary | ICD-10-CM | POA: Diagnosis not present

## 2017-07-02 DIAGNOSIS — H35033 Hypertensive retinopathy, bilateral: Secondary | ICD-10-CM | POA: Diagnosis not present

## 2017-07-02 NOTE — Progress Notes (Signed)
[facility] [address] [place], [place] [address] Phone: [phone] Subjective:     CC:  Right leg pain   [name]  [name] is a 71 y.o. female coming in with complaint of Right leg pain.  Patient states somewhat different. Patient is having more pain localized on the knee. Some mild instability. Has not no swelling. Notices maybe some catching going on. Starting to stop her from activity. Concerned with this. Once to be active. Denies any recent injury. Rates the severity pain is 7 out of 10      Past Medical History:  Diagnosis Date  . Anemia   . Blood transfusion without reported diagnosis 07/2016  . Cancer ([place]) 1991   breast  . Diabetes mellitus without complication ([place])   . GERD (gastroesophageal reflux disease)   . Glaucoma   . Hyperlipidemia   . Hypertension   . Wears glasses    Past Surgical History:  Procedure Laterality Date  . CATARACT EXTRACTION W/ INTRAOCULAR LENS  IMPLANT, BILATERAL  2000  . COLONOSCOPY    . cyst on spine    . ESOPHAGOGASTRODUODENOSCOPY (EGD) WITH PROPOFOL N/A 07/31/2016   Procedure: ESOPHAGOGASTRODUODENOSCOPY (EGD) WITH PROPOFOL;  Surgeon: Manus Gunning, MD;  [facility];  Service: Gastroenterology;  Laterality: N/A;  . EYE SURGERY  2011   lt eye blled  . MASTECTOMY Right 21 years ago  . TRIGGER FINGER RELEASE Right 06/24/2013   Procedure: RELEASE TRIGGER FINGER/A-1 PULLEY PIP RIGHT LONG FINGER;  Surgeon: Cammie Sickle., MD;  Location: [facility];  Service: Orthopedics;  Laterality: Right;  . TUBAL LIGATION     Social History   Social History  . Marital status: Married    Spouse name: N/A  . Number of children: N/A  . Years of education: N/A   Occupational History  . retired    Social History Main Topics  . Smoking status: Former Smoker    Quit date: 06/18/1993  . Smokeless tobacco: Never Used  . Alcohol use No  . Drug use: No  . Sexual activity:  Not on file   Other Topics Concern  . Not on file   Social History Narrative  . No narrative on file   No Known Allergies Family History  Problem Relation Age of Onset  . Diabetes Mother   . Diabetes Daughter   . Diabetes Son   . Colon cancer Neg Hx      Past medical history, social, surgical and family history all reviewed in electronic medical record.  No pertanent information unless stated regarding to the chief complaint.   Review of Systems:Review of systems updated and as accurate as of 07/02/17  No headache, visual changes, nausea, vomiting, diarrhea, constipation, dizziness, abdominal pain, skin rash, fevers, chills, night sweats, weight loss, swollen lymph nodes, body aches,  chest pain, shortness of breath, mood changes. Positive muscle aches, joint swelling  Objective  There were no vitals taken for this visit. Systems examined below as of 07/02/17   General: No apparent distress alert and oriented x3 mood and affect normal, dressed appropriately.  HEENT: Pupils equal, extraocular movements intact  Respiratory: Patient's speak in full sentences and does not appear short of breath  Cardiovascular: No lower extremity edema, non tender, no erythema  Skin: Warm dry intact with no signs of infection or rash on extremities or on axial skeleton.  Abdomen: Soft nontender  Neuro: Cranial nerves II through XII are intact,  neurovascularly intact in all extremities with 2+ DTRs and 2+ pulses.  Lymph: No lymphadenopathy of posterior or anterior cervical chain or axillae bilaterally.  Gait normal with good balance and coordination.  MSK:  Non tender with full range of motion and good stability and symmetric strength and tone of shoulders, elbows, wrist, hip, and ankles bilaterally. Mild arthritis of multiple joints Knee: Right Mild valgus deformity noted.  Tender to palpation over medial and PF joint line.  ROM full in flexion and extension and lower leg rotation. instability  with valgus force.  painful patellar compression. Patellar glide with moderate crepitus. Patellar and quadriceps tendons unremarkable. Hamstring and quadriceps strength is normal. Contralateral knee shows minimal arthritic changes  After informed written and verbal consent, patient was seated on exam table. Right knee was prepped with alcohol swab and utilizing anterolateral approach, patient's right knee space was injected with 4:1  marcaine 0.5%: Kenalog 40mg /dL. Patient tolerated the procedure well without immediate complications.   Impression and Recommendations:     This case required medical decision making of moderate complexity.      Note: This dictation was prepared with Dragon dictation along with smaller phrase technology. Any transcriptional errors that result from this process are unintentional.

## 2017-07-03 ENCOUNTER — Encounter: Payer: Self-pay | Admitting: Family Medicine

## 2017-07-03 ENCOUNTER — Ambulatory Visit (INDEPENDENT_AMBULATORY_CARE_PROVIDER_SITE_OTHER): Payer: Medicare Other | Admitting: Family Medicine

## 2017-07-03 DIAGNOSIS — M1711 Unilateral primary osteoarthritis, right knee: Secondary | ICD-10-CM | POA: Insufficient documentation

## 2017-07-03 MED ORDER — GABAPENTIN 100 MG PO CAPS: 200.0000 mg | ORAL_CAPSULE | Freq: Every day | ORAL | 3 refills | Status: DC

## 2017-07-03 NOTE — Patient Instructions (Signed)
Good to see you  Teresa Barnes is your friend.  pennsaid pinkie amount topically 2 times daily as needed.   Gabapentin 200mg  at night Can try 100mg  at first  If you go to a gym biking may be better the the treadmill . See me again in 3-4 weeks.

## 2017-07-03 NOTE — Assessment & Plan Note (Signed)
She was given an injection today. We discussed icing regimen, home exercises, which activities doing which ones to avoid. Patient will start increasing activity slowly. CMP she responds. Discussed the possibility of other injections. Vision has had a history of more of a lumbar radiculopathy with radicular symptoms and will monitor for any signs and symptoms that correspond with this as well. Follow-up again in 4 weeks

## 2017-08-04 ENCOUNTER — Ambulatory Visit: Payer: Medicare Other | Admitting: Family Medicine

## 2017-08-05 NOTE — Progress Notes (Signed)
[facility] [address], [place] [address] Phone: [phone] Subjective:     CC:  Right leg pain f/u  [id number]  Teresa Barnes is a 71 y.o. female coming in with complaint of Right leg pain.  Patient had signs and symptoms that is mostly consistent with her knee arthritis. Was given an injection. Patient was given home exercises, icing regimen. Patient states Her leg pain did not seem to improve. States that she is having increasing weakness. Patient is having more the radicular symptoms again. More back spasming as well. Does like she is having worsening symptoms overall that is affecting daily activities.    that her left shoulder is hurting. Noticing decreasing range of motion. Patient states that she has not lost strength and she did on the right side when she had a rotator cuff tear. Patient states that certain Activities such as dressing has become more difficult as well. Waking her up at night.      Past Medical History:  Diagnosis Date  . Anemia   . Blood transfusion without reported diagnosis 07/2016  . Cancer ([place]) 1991   breast  . Diabetes mellitus without complication ([place])   . GERD (gastroesophageal reflux disease)   . Glaucoma   . Hyperlipidemia   . Hypertension   . Wears glasses    Past Surgical History:  Procedure Laterality Date  . CATARACT EXTRACTION W/ INTRAOCULAR LENS  IMPLANT, BILATERAL  2000  . COLONOSCOPY    . cyst on spine    . ESOPHAGOGASTRODUODENOSCOPY (EGD) WITH PROPOFOL N/A 07/31/2016   Procedure: ESOPHAGOGASTRODUODENOSCOPY (EGD) WITH PROPOFOL;  Surgeon: Manus Gunning, MD;  [facility];  Service: Gastroenterology;  Laterality: N/A;  . EYE SURGERY  2011   lt eye blled  . MASTECTOMY Right 21 years ago  . TRIGGER FINGER RELEASE Right 06/24/2013   Procedure: RELEASE TRIGGER FINGER/A-1 PULLEY PIP RIGHT LONG FINGER;  Surgeon: Cammie Sickle., MD;  Location: [facility];   Service: Orthopedics;  Laterality: Right;  . TUBAL LIGATION     Social History   Social History  . Marital status: Married    Spouse name: N/A  . Number of children: N/A  . Years of education: N/A   Occupational History  . retired    Social History Main Topics  . Smoking status: Former Smoker    Quit date: 06/18/1993  . Smokeless tobacco: Never Used  . Alcohol use No  . Drug use: No  . Sexual activity: Not Asked   Other Topics Concern  . None   Social History Narrative  . None   No Known Allergies Family History  Problem Relation Age of Onset  . Diabetes Mother   . Diabetes Daughter   . Diabetes Son   . Colon cancer Neg Hx      Past medical history, social, surgical and family history all reviewed in electronic medical record.  No pertanent information unless stated regarding to the chief complaint.   Review of Systems:Review of systems updated and as accurate as of 08/06/17  No headache, visual changes, nausea, vomiting, diarrhea, constipation, dizziness, abdominal pain, skin rash, fevers, chills, night sweats, weight loss, swollen lymph nodes, body aches,  chest pain, shortness of breath, mood changes. Positive muscle aches, joint swelling  Objective  Blood pressure 130/60, pulse (!) 57, height 5\' 4"  (1.626 m), weight 178 lb (80.7 kg), SpO2 97 %.   Systems examined below as of 08/06/17 General:  NAD A&O x3 mood, affect normal  HEENT: Pupils equal, extraocular movements intact no nystagmus Respiratory: not short of breath at rest or with speaking Cardiovascular: No lower extremity edema, non tender Skin: Warm dry intact with no signs of infection or rash on extremities or on axial skeleton. Abdomen: Soft nontender, no masses Neuro: Cranial nerves  intact, neurovascularly intact in all extremities with 2+ DTRs and 2+ pulses. Lymph: No lymphadenopathy appreciated today  Gait Antalgic MSK: Non tender with full range of motion and good stability and symmetric  strength and tone of  elbows, wrist,  knee hips and ankles bilaterally.  Mild arthritic changes of multiple joints. Right-sided upper extremity weakness compared to contralateral sign. Shoulder: Left Inspection reveals no abnormalities, atrophy or asymmetry. Diffuse tenderness to palpation Loss of range of motion. Lacking 15 of passive forward flexion, external rotation and only has internal rotation to lateral hip Rotator cuff strength normal throughout. No signs of impingement with negative Neer and Hawkin's tests, empty can sign. Speeds and Yergason's tests normal. Positive O'Brien's Normal scapular function observed. No painful arc and no drop arm sign. No apprehension sign  Contralateral shoulder and does have weakness secondary to rotator cuff tear  Back Exam:  Inspection: Unremarkable  Motion: Flexion 35 deg with worsening symptoms, Extension 45 deg, Side Bending to 45 deg bilaterally,  Rotation to 45 deg bilaterally  SLR laying: Positive right XSLR laying: Negative  Palpable tenderness: Severe tenderness to palpation on the right sign.Marland Kitchen FABER: Positive right. Sensory change: Gross sensation intact to all lumbar and sacral dermatomes.  Reflexes: 2+ at both patellar tendons, 2+ at achilles tendons, Babinski's downgoing.  Strength at foot  Mild weakness with plantarflexion of 4 out of 5 strength compared to contralateral sign.   Procedure 94709; 15 additional minutes spent for Therapeutic exercises as stated in above notes.  This included exercises focusing on stretching, strengthening, with significant focus on eccentric aspects.   Long term goals include an improvement in range of motion, strength, endurance as well as avoiding reinjury. Patient's frequency would include in 1-2 times a day, 3-5 times a week for a duration of 6-12 weeks. Shoulder Exercises that included:  Basic scapular stabilization to include adduction and depression of scapula Scaption, focusing on proper  movement and good control Internal and External rotation utilizing a theraband, with elbow tucked at side entire time Rows with theraband which was given today   Proper technique shown and discussed handout in great detail with ATC.  All questions were discussed and answered.      Impression and Recommendations:     This case required medical decision making of moderate complexity.      Note: This dictation was prepared with Dragon dictation along with smaller phrase technology. Any transcriptional errors that result from this process are unintentional.

## 2017-08-06 ENCOUNTER — Ambulatory Visit (INDEPENDENT_AMBULATORY_CARE_PROVIDER_SITE_OTHER): Payer: Medicare Other | Admitting: Family Medicine

## 2017-08-06 ENCOUNTER — Ambulatory Visit (INDEPENDENT_AMBULATORY_CARE_PROVIDER_SITE_OTHER)
Admission: RE | Admit: 2017-08-06 | Discharge: 2017-08-06 | Disposition: A | Discharge status: Home / Self Care | Payer: Medicare Other | Source: Ambulatory Visit | Attending: Family Medicine | Admitting: Family Medicine

## 2017-08-06 ENCOUNTER — Encounter: Payer: Self-pay | Admitting: Family Medicine

## 2017-08-06 VITALS — BP 130/60 | HR 57 | Ht 64.0 in | Wt 178.0 lb

## 2017-08-06 DIAGNOSIS — G8929 Other chronic pain: Secondary | ICD-10-CM

## 2017-08-06 DIAGNOSIS — M1711 Unilateral primary osteoarthritis, right knee: Secondary | ICD-10-CM

## 2017-08-06 DIAGNOSIS — M5441 Lumbago with sciatica, right side: Secondary | ICD-10-CM | POA: Diagnosis not present

## 2017-08-06 DIAGNOSIS — M7502 Adhesive capsulitis of left shoulder: Secondary | ICD-10-CM

## 2017-08-06 DIAGNOSIS — M5416 Radiculopathy, lumbar region: Secondary | ICD-10-CM | POA: Diagnosis not present

## 2017-08-06 DIAGNOSIS — M7500 Adhesive capsulitis of unspecified shoulder: Secondary | ICD-10-CM

## 2017-08-06 HISTORY — DX: Adhesive capsulitis of unspecified shoulder: M75.00

## 2017-08-06 NOTE — Assessment & Plan Note (Signed)
Seems stable. Not likely will was contributing to most of her right leg pain and more of a lumbar radiculopathy. We'll continue to monitor.

## 2017-08-06 NOTE — Patient Instructions (Addendum)
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   Ice 20 minutes 2 times daily. Usually after activity and before bed. We will get back xray today  COntinue the gabapentin and see if that helps the nerve pain  If not better by Monday call and we will get MRI of your back. Depending on that then we will consider epidural  For the shoulder see me again in 4-6 weeks.

## 2017-08-06 NOTE — Assessment & Plan Note (Signed)
Patient is having worsening radicular symptoms at this time. X-rays ordered today. We discussed if any increasing weakness we will need advance imaging such as an MRI. Patient is willing to do this and would be a candidate for epidural steroid injections are possible. We discussed the icing regimen, restarting the gabapentin on a more regular basis. Patient will follow-up with me again in 1-2 weeks

## 2017-08-06 NOTE — Assessment & Plan Note (Signed)
Noticing decreasing range of motion. Patient given injection today. Hopefully this will be beneficial. Home exercises given, discussed an icing regimen. Discuss importance of vitamin D and controller patient's blood sugars. Follow-up again in 4 weeks

## 2017-08-07 ENCOUNTER — Telehealth: Payer: Self-pay | Admitting: Family Medicine

## 2017-08-07 DIAGNOSIS — M5416 Radiculopathy, lumbar region: Secondary | ICD-10-CM

## 2017-08-07 NOTE — Telephone Encounter (Signed)
Pt called stating she was seen yesterday and her leg is still bothering her, she would like a call back  In regard,

## 2017-08-21 ENCOUNTER — Ambulatory Visit
Admission: RE | Admit: 2017-08-21 | Discharge: 2017-08-21 | Disposition: A | Discharge status: Home / Self Care | Payer: Medicare Other | Source: Ambulatory Visit | Attending: Family Medicine | Admitting: Family Medicine

## 2017-08-21 DIAGNOSIS — M5416 Radiculopathy, lumbar region: Secondary | ICD-10-CM

## 2017-08-22 ENCOUNTER — Other Ambulatory Visit: Payer: Self-pay

## 2017-08-22 DIAGNOSIS — M5416 Radiculopathy, lumbar region: Secondary | ICD-10-CM

## 2017-08-22 NOTE — Progress Notes (Signed)
Spoke with patient per result. She would like to go ahead with the epidural. Told her [facility] will be calling her.

## 2017-09-04 ENCOUNTER — Ambulatory Visit
Admission: RE | Admit: 2017-09-04 | Discharge: 2017-09-04 | Disposition: A | Discharge status: Home / Self Care | Payer: Medicare Other | Source: Ambulatory Visit | Attending: Family Medicine | Admitting: Family Medicine

## 2017-09-04 DIAGNOSIS — M5416 Radiculopathy, lumbar region: Secondary | ICD-10-CM

## 2017-09-04 MED ORDER — IOPAMIDOL (ISOVUE-M 200) INJECTION 41%
10.0000 mL | Freq: Once | INTRAMUSCULAR | Status: AC
  Administered 2017-09-04: 10 mL via EPIDURAL

## 2017-09-04 MED ORDER — METHYLPREDNISOLONE ACETATE 40 MG/ML INJ SUSP (RADIOLOG
120.0000 mg | Freq: Once | INTRAMUSCULAR | Status: AC
  Administered 2017-09-04: 120 mg via EPIDURAL

## 2017-09-04 NOTE — Discharge Instructions (Signed)

## 2017-10-27 ENCOUNTER — Other Ambulatory Visit: Payer: Self-pay | Admitting: Family Medicine

## 2017-10-28 NOTE — Telephone Encounter (Signed)
Refill done.  

## 2017-11-15 NOTE — Progress Notes (Signed)
[name] [address] [place], [place] [address] Phone: [phone] Subjective:      CC: Low back and left shoulder follow-up  [id number]  Teresa Barnes is a 71 y.o. female coming in with complaint of back pain.  Patient has been seen before and has had difficulty with her back.  Patient was having right leg pain which seemed to be more of the right knee arthritis.  Patient responded a little bit to an injection in the right knee.  Continued to have right leg pain that seems to be more of a radiculopathy.  Started on gabapentin.  Patient states she had an epidural since last visit which caused pain and swelling in both legs. She still complains of her right leg being "slower."   Patient also was seen and had more of the left shoulder pain 3 months ago.  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 She said that the injection in her shoulder has been doing well.  No pain at all at this time and full range of motion.     Past Medical History:  Diagnosis Date  . Anemia   . Blood transfusion without reported diagnosis 07/2016  . Cancer ([place]) 1991   breast  . Diabetes mellitus without complication ([place])   . GERD (gastroesophageal reflux disease)   . Glaucoma   . Hyperlipidemia   . Hypertension   . Wears glasses    Past Surgical History:  Procedure Laterality Date  . CATARACT EXTRACTION W/ INTRAOCULAR LENS  IMPLANT, BILATERAL  2000  . COLONOSCOPY    . cyst on spine    . ESOPHAGOGASTRODUODENOSCOPY (EGD) WITH PROPOFOL N/A 07/31/2016   Procedure: ESOPHAGOGASTRODUODENOSCOPY (EGD) WITH PROPOFOL;  Surgeon: Manus Gunning, MD;  [facility];  Service: Gastroenterology;  Laterality: N/A;  . EYE SURGERY  2011   lt eye blled  . MASTECTOMY Right 21 years ago  . TRIGGER FINGER RELEASE Right 06/24/2013   Procedure: RELEASE TRIGGER FINGER/A-1 PULLEY PIP RIGHT LONG FINGER;  Surgeon: Cammie Sickle., MD;  Location: [facility];  Service:  Orthopedics;  Laterality: Right;  . TUBAL LIGATION     Social History   Socioeconomic History  . Marital status: Married    Spouse name: None  . Number of children: None  . Years of education: None  . Highest education level: None  Social Needs  . Financial resource strain: None  . Food insecurity - worry: None  . Food insecurity - inability: None  . Transportation needs - medical: None  . Transportation needs - non-medical: None  Occupational History  . Occupation: retired  Tobacco Use  . Smoking status: Former Smoker    Last attempt to quit: 06/18/1993    Years since quitting: 24.4  . Smokeless tobacco: Never Used  Substance and Sexual Activity  . Alcohol use: No  . Drug use: No  . Sexual activity: None  Other Topics Concern  . None  Social History Narrative  . None   No Known Allergies Family History  Problem Relation Age of Onset  . Diabetes Mother   . Diabetes Daughter   . Diabetes Son   . Colon cancer Neg Hx      Past medical history, social, surgical and family history all reviewed in electronic medical record.  No pertanent information unless stated regarding to the chief complaint.   Review of Systems:Review of systems updated and as accurate as of 11/17/17  No headache, visual  changes, nausea, vomiting, diarrhea, constipation, dizziness, abdominal pain, skin rash, fevers, chills, night sweats, weight loss, swollen lymph nodes, body aches, joint swelling, chest pain, shortness of breath, mood changes.  Positive muscle aches  Objective  Blood pressure 124/62, pulse 63, height 5\' 4"  (1.626 m), weight 184 lb (83.5 kg), SpO2 97 %. Systems examined below as of 11/17/17   General: No apparent distress alert and oriented x3 mood and affect normal, dressed appropriately.  HEENT: Pupils equal, extraocular movements intact  Respiratory: Patient's speak in full sentences and does not appear short of breath  Cardiovascular: No lower extremity edema, non tender, no  erythema  Skin: Warm dry intact with no signs of infection or rash on extremities or on axial skeleton.  Abdomen: Soft nontender  Neuro: Cranial nerves II through XII are intact, neurovascularly intact in all extremities with 2+ DTRs and 2+ pulses.  Lymph: No lymphadenopathy of posterior or anterior cervical chain or axillae bilaterally.  Gait antalgic gait MSK:  Non tender with full range of motion and good stability and symmetric strength and tone of shoulders, elbows, wrist, , knee and ankles bilaterally.  Mild arthritic changes of multiple joints  Right hip exam shows the patient does have some discomfort over the greater trochanteric area.  Still has a positive straight leg test.  4 out of 5 strength to hip flexion and dorsiflexion of the foot compared to the contralateral side.  Deep tendon reflexes are intact.  No muscle atrophy.  Severe tenderness over the greater trochanteric area on the right side    Procedure: Real-time Ultrasound Guided Injection of right greater trochanteric bursitis secondary to patient's body habitus Device: GE Logiq Q7 Ultrasound guided injection is preferred based studies that show increased duration, increased effect, greater accuracy, decreased procedural pain, increased response rate, and decreased cost with ultrasound guided versus blind injection.  Verbal informed consent obtained.  Time-out conducted.  Noted no overlying erythema, induration, or other signs of local infection.  Skin prepped in a sterile fashion.  Local anesthesia: Topical Ethyl chloride.  With sterile technique and under real time ultrasound guidance:  Greater trochanteric area was visualized and patient's bursa was noted. A 22-gauge 3 inch needle was inserted and 4 cc of 0.5% Marcaine and 1 cc of Kenalog 40 mg/dL was injected. Pictures taken Completed without difficulty  Pain immediately resolved suggesting accurate placement of the medication.  Advised to call if fevers/chills,  erythema, induration, drainage, or persistent bleeding.  Images permanently stored and available for review in the ultrasound unit.  Impression: Technically successful ultrasound guided injection.   Impression and Recommendations:     This case required medical decision making of moderate complexity.      Note: This dictation was prepared with Dragon dictation along with smaller phrase technology. Any transcriptional errors that result from this process are unintentional.

## 2017-11-17 ENCOUNTER — Encounter: Payer: Self-pay | Admitting: Family Medicine

## 2017-11-17 ENCOUNTER — Ambulatory Visit: Payer: Medicare Other | Admitting: Family Medicine

## 2017-11-17 DIAGNOSIS — M7061 Trochanteric bursitis, right hip: Secondary | ICD-10-CM | POA: Diagnosis not present

## 2017-11-17 DIAGNOSIS — M5416 Radiculopathy, lumbar region: Secondary | ICD-10-CM | POA: Diagnosis not present

## 2017-11-17 NOTE — Assessment & Plan Note (Signed)
Patient given injection today.  New exercises working on hip abductors.  We discussed positioning with sitting as well as lying down.  We discussed icing regimen.  Follow-up again with me in 4-6 weeks

## 2017-11-17 NOTE — Patient Instructions (Signed)
Good to see you  You will do great in the long run Continue the exercises Ice is your friend.  Read about a low dose of effexor for the nerve pain  Or we can do a greater trochanteric injection in the hip if you would like The medicine I use is kenalog.  Call us [phone] when you decide Happy New Year!

## 2017-11-17 NOTE — Assessment & Plan Note (Signed)
Stable.  Patient did not respond to the epidurals.  Does not want to retry them at this time.  Patient did have some swelling of the lower extremities previously.  We discussed different medications and patient encouraged to continue with the gabapentin.  Follow-up again in 3-4 weeks.

## 2017-11-21 ENCOUNTER — Other Ambulatory Visit: Payer: Self-pay | Admitting: Family Medicine

## 2017-11-21 DIAGNOSIS — Z1231 Encounter for screening mammogram for malignant neoplasm of breast: Secondary | ICD-10-CM

## 2017-12-09 ENCOUNTER — Other Ambulatory Visit: Payer: Self-pay | Admitting: Internal Medicine

## 2017-12-09 ENCOUNTER — Encounter: Payer: Self-pay | Admitting: Family Medicine

## 2017-12-09 ENCOUNTER — Ambulatory Visit (INDEPENDENT_AMBULATORY_CARE_PROVIDER_SITE_OTHER): Payer: Medicare Other | Admitting: Family Medicine

## 2017-12-09 ENCOUNTER — Other Ambulatory Visit: Payer: Self-pay | Admitting: *Deleted

## 2017-12-09 VITALS — BP 102/58 | HR 58 | Temp 98.1°F | Ht 62.25 in | Wt 182.5 lb

## 2017-12-09 DIAGNOSIS — Z23 Encounter for immunization: Secondary | ICD-10-CM

## 2017-12-09 DIAGNOSIS — K2100 Gastro-esophageal reflux disease with esophagitis, without bleeding: Secondary | ICD-10-CM

## 2017-12-09 DIAGNOSIS — Z853 Personal history of malignant neoplasm of breast: Secondary | ICD-10-CM | POA: Insufficient documentation

## 2017-12-09 DIAGNOSIS — E1159 Type 2 diabetes mellitus with other circulatory complications: Secondary | ICD-10-CM | POA: Diagnosis not present

## 2017-12-09 DIAGNOSIS — Z1159 Encounter for screening for other viral diseases: Secondary | ICD-10-CM

## 2017-12-09 DIAGNOSIS — E119 Type 2 diabetes mellitus without complications: Secondary | ICD-10-CM | POA: Diagnosis not present

## 2017-12-09 DIAGNOSIS — I152 Hypertension secondary to endocrine disorders: Secondary | ICD-10-CM

## 2017-12-09 DIAGNOSIS — E782 Mixed hyperlipidemia: Secondary | ICD-10-CM

## 2017-12-09 DIAGNOSIS — I10 Essential (primary) hypertension: Secondary | ICD-10-CM

## 2017-12-09 DIAGNOSIS — K449 Diaphragmatic hernia without obstruction or gangrene: Secondary | ICD-10-CM | POA: Diagnosis not present

## 2017-12-09 DIAGNOSIS — H409 Unspecified glaucoma: Secondary | ICD-10-CM | POA: Insufficient documentation

## 2017-12-09 DIAGNOSIS — M1A9XX0 Chronic gout, unspecified, without tophus (tophi): Secondary | ICD-10-CM | POA: Insufficient documentation

## 2017-12-09 DIAGNOSIS — K222 Esophageal obstruction: Secondary | ICD-10-CM | POA: Insufficient documentation

## 2017-12-09 HISTORY — DX: Diaphragmatic hernia without obstruction or gangrene: K44.9

## 2017-12-09 HISTORY — DX: Chronic gout, unspecified, without tophus (tophi): M1A.9XX0

## 2017-12-09 HISTORY — DX: Esophageal obstruction: K22.2

## 2017-12-09 LAB — CBC WITH DIFFERENTIAL/PLATELET
BASOS PCT: 1.2 % (ref 0.0–3.0)
Basophils Absolute: 0.1 10*3/uL (ref 0.0–0.1)
EOS PCT: 3.8 % (ref 0.0–5.0)
Eosinophils Absolute: 0.3 10*3/uL (ref 0.0–0.7)
HEMATOCRIT: 34.2 % — AB (ref 36.0–46.0)
HEMOGLOBIN: 11.3 g/dL — AB (ref 12.0–15.0)
LYMPHS PCT: 23.6 % (ref 12.0–46.0)
Lymphs Abs: 1.6 10*3/uL (ref 0.7–4.0)
MCHC: 32.9 g/dL (ref 30.0–36.0)
MCV: 95.5 fl (ref 78.0–100.0)
MONOS PCT: 8.4 % (ref 3.0–12.0)
Monocytes Absolute: 0.6 10*3/uL (ref 0.1–1.0)
Neutro Abs: 4.4 10*3/uL (ref 1.4–7.7)
Neutrophils Relative %: 63 % (ref 43.0–77.0)
Platelets: 354 10*3/uL (ref 150.0–400.0)
RBC: 3.58 Mil/uL — ABNORMAL LOW (ref 3.87–5.11)
RDW: 15 % (ref 11.5–15.5)
WBC: 7 10*3/uL (ref 4.0–10.5)

## 2017-12-09 LAB — LIPID PANEL
CHOLESTEROL: 203 mg/dL — AB (ref 0–200)
HDL: 57.6 mg/dL (ref >39.00)
LDL Cholesterol: 125 mg/dL — ABNORMAL HIGH (ref 0–99)
NonHDL: 145.75
TRIGLYCERIDES: 103 mg/dL (ref 0.0–149.0)
Total CHOL/HDL Ratio: 4
VLDL: 20.6 mg/dL (ref 0.0–40.0)

## 2017-12-09 LAB — COMPREHENSIVE METABOLIC PANEL
ALBUMIN: 4.1 g/dL (ref 3.5–5.2)
ALK PHOS: 65 U/L (ref 39–117)
ALT: 9 U/L (ref 0–35)
AST: 13 U/L (ref 0–37)
BUN: 30 mg/dL — AB (ref 6–23)
CALCIUM: 10.2 mg/dL (ref 8.4–10.5)
CO2: 29 mEq/L (ref 19–32)
Chloride: 101 mEq/L (ref 96–112)
Creatinine, Ser: 1.46 mg/dL — ABNORMAL HIGH (ref 0.40–1.20)
GFR: 45.13 mL/min — AB (ref >60.00)
Glucose, Bld: 177 mg/dL — ABNORMAL HIGH (ref 70–99)
POTASSIUM: 4.5 meq/L (ref 3.5–5.1)
SODIUM: 137 meq/L (ref 135–145)
Total Bilirubin: 0.6 mg/dL (ref 0.2–1.2)
Total Protein: 7 g/dL (ref 6.0–8.3)

## 2017-12-09 LAB — HEMOGLOBIN A1C: Hgb A1c MFr Bld: 7.9 % — ABNORMAL HIGH (ref 4.6–6.5)

## 2017-12-09 NOTE — Patient Instructions (Addendum)
Please return in 3 months to recheck your diabetes and blood pressure  It was a pleasure meeting you today! Thank you for choosing [place] to meet your healthcare needs! I truly look forward to working with you. If you have any questions or concerns, please send me a message via Mychart or call the office at [phone].   Type 2 Diabetes Mellitus, Self Care, Adult When you have type 2 diabetes (type 2 diabetes mellitus), you must keep your blood sugar (glucose) under control. You can do this with:  Nutrition.  Exercise.  Lifestyle changes.  Medicines or insulin, if needed.  Support from your doctors and others.  How do I manage my blood sugar?  Check your blood sugar level every day, as often as told.  Call your doctor if your blood sugar is above your goal numbers for 2 tests in a row.  Have your A1c (hemoglobin A1c) level checked at least two times a year. Have it checked more often if your doctor tells you to. Your doctor will set treatment goals for you. Generally, you should have these blood sugar levels:  Before meals (preprandial): 80-130 mg/dL (4.4-7.2 mmol/L).  After meals (postprandial): lower than 180 mg/dL (10 mmol/L).  A1c level: less than 7%.  What do I need to know about high blood sugar? High blood sugar is called hyperglycemia. Know the signs of high blood sugar. Signs may include:  Feeling: ? Thirsty. ? Hungry. ? Very tired.  Needing to pee (urinate) more than usual.  Blurry vision.  What do I need to know about low blood sugar? Low blood sugar is called hypoglycemia. This is when blood sugar is at or below 70 mg/dL (3.9 mmol/L). Symptoms may include:  Feeling: ? Hungry. ? Worried or nervous (anxious). ? Sweaty and clammy. ? Confused. ? Dizzy. ? Sleepy. ? Sick to your stomach (nauseous).  Having: ? A fast heartbeat (palpitations). ? A headache. ? A change in your vision. ? Jerky movements that you cannot control  (seizure). ? Nightmares. ? Tingling or no feeling (numbness) around the mouth, lips, or tongue.  Having trouble with: ? Talking. ? Paying attention (concentrating). ? Moving (coordination). ? Sleeping.  Shaking.  Passing out (fainting).  Getting upset easily (irritability).  Treating low blood sugar  To treat low blood sugar, eat or drink something sugary right away. If you can think clearly and swallow safely, follow the 15:15 rule:  Take 15 grams of a fast-acting carb (carbohydrate). Some fast-acting carbs are: ? 1 tube of glucose gel. ? 3 sugar tablets (glucose pills). ? 6-8 pieces of hard candy. ? 4 oz (120 mL) of fruit juice. ? 4 oz (120 mL) regular (not diet) soda.  Check your blood sugar 15 minutes after you take the carb.  If your blood sugar is still at or below 70 mg/dL (3.9 mmol/L), take 15 grams of a carb again.  If your blood sugar does not go above 70 mg/dL (3.9 mmol/L) after 3 tries, get help right away.  After your blood sugar goes back to normal, eat a meal or a snack within 1 hour.  Treating very low blood sugar If your blood sugar is at or below 54 mg/dL (3 mmol/L), you have very low blood sugar (severe hypoglycemia). This is an emergency. Do not wait to see if the symptoms will go away. Get medical help right away. Call your local emergency services (911 in the [place] Do not drive yourself to the hospital. If you have very  low blood sugar and you cannot eat or drink, you may need a glucagon shot (injection). A family member or friend should learn how to check your blood sugar and how to give you a glucagon shot. Ask your doctor if you need to have a glucagon shot kit at home. What else is important to manage my diabetes? Medicine Follow these instructions about insulin and diabetes medicines:  Take them as told by your doctor.  Adjust them as told by your doctor.  Do not run out of them.  Having diabetes can raise your risk for other long-term  conditions. These include heart or kidney disease. Your doctor may prescribe medicines to help prevent problems from diabetes. Food   Make healthy food choices. These include: ? Chicken, fish, egg whites, and beans. ? Oats, whole wheat, bulgur, brown rice, quinoa, and millet. ? Fresh fruits and vegetables. ? Low-fat dairy products. ? Nuts, avocado, olive oil, and canola oil.  Make a food plan with a specialist (dietitian).  Follow instructions from your doctor about what you cannot eat or drink.  Drink enough fluid to keep your pee (urine) clear or pale yellow.  Eat healthy snacks between healthy meals.  Keep track of carbs that you eat. Read food labels. Learn food serving sizes.  Follow your sick day plan when you cannot eat or drink normally. Make this plan with your doctor so it is ready to use. Activity  Exercise at least 3 times a week.  Do not go more than 2 days without exercising.  Talk with your doctor before you start a new exercise. Your doctor may need to adjust your insulin, medicines, or food. Lifestyle   Do not use any tobacco products. These include cigarettes, chewing tobacco, and e-cigarettes.If you need help quitting, ask your doctor.  Ask your doctor how much alcohol is safe for you.  Learn to deal with stress. If you need help with this, ask your doctor. Body care  Stay up to date with your shots (immunizations).  Have your eyes and feet checked by a doctor as often as told.  Check your skin and feet every day. Check for cuts, bruises, redness, blisters, or sores.  Brush your teeth and gums two times a day.  Floss at least one time a day.  Go to the dentist least one time every 6 months.  Stay at a healthy weight. General instructions   Take over-the-counter and prescription medicines only as told by your doctor.  Share your diabetes care plan with: ? Your work or school. ? People you live with.  Check your pee (urine) for  ketones: ? When you are sick. ? As told by your doctor.  Carry a card or wear jewelry that says that you have diabetes.  Ask your doctor: ? Do I need to meet with a diabetes educator? ? Where can I find a support group for people with diabetes?  Keep all follow-up visits as told by your doctor. This is important. Where to find more information: To learn more about diabetes, visit:  American Diabetes Association: www.diabetes.org  American Association of Diabetes Educators: www.diabeteseducator.org/patient-resources  This information is not intended to replace advice given to you by your health care provider. Make sure you discuss any questions you have with your health care provider. Document Released: 02/26/2016 Document Revised: 04/11/2016 Document Reviewed: 12/08/2015 Elsevier Interactive Patient Education  [employer].

## 2017-12-09 NOTE — Progress Notes (Signed)
Subjective  CC:  Chief Complaint  Patient presents with  . Establish Care    Last Physical in November 2018, No complaints  . Diabetes    Patient would like Referral to Endocrinology.     HPI: Teresa Barnes is a 72 y.o. female who presents to [place] at [facility] today to establish care with me as a new patient. Former PCP, Dr. Jeanann Lewandowsky retired. She had been seeing him for the last 13 years. We have requested records but not yet available for my review. I did review records from GI, SM, and her hospitalization. She has the following concerns or needs:   Pleasant 72 year old female with past medical history documented below in her problem list.  She had her most recent physical in November with her prior PCP.  Overall she is doing well.  Past medical history is significant for well-controlled type 2 diabetes, hypertension, hyperlipidemia, osteoarthritis, lumbar radiculopathy treated by sports medicine, and history of erosive esophagitis and upper GI bleed that is now resolved on chronic PPI therapy.   Diabetes-patient denies symptoms of hypoglycemia.  He has been well controlled for years.  Most recent A1c was 6.1 in November.  However, status post steroid injections which elevate her sugars.  She denies peripheral neuropathy symptoms or history of foot sores.  Her diet is fair.  She remains active throughout her day.  She denies diabetic retinopathy.  She does suffer from glaucoma.  She is on metformin and Victoza.  She was instructed to use glipizide on an as-needed basis for sugars greater than 150.  I believe this was related to hyperglycemia secondary to steroid use.   Hypertension-she reports that she had been on ACE inhibitor but this was stopped.  She is on hydrochlorthiazide and amlodipine.  Her blood pressure is low but she denies symptoms of hypotension.  She denies chest pain, heart disease, symptoms of CHF.   Hyperlipidemia on statin.  Well  controlled by her report.  No myalgias.  Compliance is good   Arthritis and bursitis: I reviewed sports medicine notes.  Multiple procedures and imaging studies.  Currently she is improving.  She is on gabapentin for history of lumbar radiculopathy.   Esophagitis: Most recent EGD was about a year ago with healed esophagitis and esophageal stricture status post dilation.  She continues on PPI therapy.  She denies symptoms of reflux  We updated and reviewed the patient's past history in detail and it is documented below.  Patient Active Problem List   Diagnosis Date Noted  . Mixed hyperlipidemia 12/09/2017    Priority: High  . Hypertension associated with diabetes ([place]) 12/09/2017    Priority: High  . H/O: upper GI bleed 07/30/2016    Priority: High  . Controlled type 2 diabetes mellitus without complication, without long-term current use of insulin ([place]) 04/03/2009    Priority: High  . History of right breast cancer 12/09/2017    Priority: Medium  . Degenerative arthritis of right knee 07/03/2017    Priority: Medium  . Right lumbar radiculopathy 01/23/2016    Priority: Medium  . Arthritis of right acromioclavicular joint 01/02/2016    Priority: Medium  . Greater trochanteric bursitis of right hip 11/17/2017    Priority: Low  . Absolute anemia     Priority: Low  . Hiatal hernia 12/09/2017  . Esophageal stricture 12/09/2017  . Chronic gout 12/09/2017  . Glaucoma 12/09/2017   Health Maintenance  Topic Date Due  . Hepatitis C  Screening  1946-05-26  . URINE MICROALBUMIN  08/28/1954  . DEXA SCAN  08/28/2009  . TETANUS/TDAP  12/09/2018 (Originally 08/29/1965)  . HEMOGLOBIN A1C  03/18/2018  . OPHTHALMOLOGY EXAM  05/18/2018  . FOOT EXAM  12/09/2018  . PNA vac Low Risk Adult (2 of 2 - PPSV23) 12/09/2018  . MAMMOGRAM  12/23/2018  . COLONOSCOPY  06/15/2024  . INFLUENZA VACCINE  Completed   Immunization History  Administered Date(s) Administered  . Influenza, High Dose Seasonal  PF 08/18/2017  . Influenza,inj,Quad PF,6+ Mos 08/01/2016  . Influenza-Unspecified 08/18/2014  . Pneumococcal Conjugate-13 12/09/2017   Current Meds  Medication Sig  . allopurinol (ZYLOPRIM) 100 MG tablet Take 100 mg by mouth daily.   Marland Kitchen amLODipine (NORVASC) 10 MG tablet Take 5 mg by mouth daily.  . bimatoprost (LUMIGAN) 0.01 % SOLN Place 1 drop into both eyes nightly. 1 drop both eyes  . ferrous sulfate 325 (65 FE) MG EC tablet Take 325 mg by mouth 3 (three) times daily with meals.  . folic acid (FOLVITE) 1 MG tablet Take 1 mg by mouth daily.  Marland Kitchen gabapentin (NEURONTIN) 100 MG capsule TAKE 2 CAPSULES BY MOUTH AT BEDTIME  . hydrochlorothiazide (HYDRODIURIL) 12.5 MG tablet Take 12.5 mg by mouth daily.   Marland Kitchen lovastatin (MEVACOR) 20 MG tablet Take 20 mg by mouth at bedtime.   . metFORMIN (GLUCOPHAGE) 1000 MG tablet Take 1,000 mg by mouth daily with breakfast.   . naproxen sodium (ALEVE) 220 MG tablet Take by mouth.  Marland Kitchen NOVOFINE PLUS 32G X 4 MM MISC   . omeprazole (PRILOSEC) 40 MG capsule   . Propylene Glycol (SYSTANE BALANCE OP) Place 1 drop into both eyes 3 (three) times daily as needed (dry eyes).  Marland Kitchen VICTOZA 18 MG/3ML SOPN Inject 18 mg into the skin daily.   . vitamin B-12 (CYANOCOBALAMIN) 250 MCG tablet Take 500 mcg by mouth daily.  . [DISCONTINUED] glipiZIDE (GLUCOTROL XL) 2.5 MG 24 hr tablet Take 2.5 mg by mouth as needed (If sugar is over 150).     Allergies: Patient is allergic to prednisone and tramadol. 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 Past Surgical History Patient  has a past  surgical history that includes Tubal ligation; Colonoscopy; Eye surgery (2011); Trigger finger release (Right, 06/24/2013); Esophagogastroduodenoscopy (egd) with propofol (N/A, 07/31/2016); Cataract extraction w/ intraocular lens  implant, bilateral (2000); cyst on spine; Mastectomy modified radical (Right, 1990); and Trigger finger release. Family History: Patient family history includes Arthritis in her daughter and son; Diabetes in her daughter, mother, sister, and son; Hyperlipidemia in her daughter. Social History:  Patient  reports that she quit smoking about 24 years ago. Her smoking use included cigarettes. She has a 2.50 pack-year smoking history. she has never used smokeless tobacco. She reports that she does not drink alcohol or use drugs.  Review of Systems: Constitutional: negative for fever or malaise Ophthalmic: negative for photophobia, double vision or loss of vision Cardiovascular: negative for chest pain, dyspnea on exertion, or new LE swelling Respiratory: negative for SOB or persistent cough Gastrointestinal: negative for abdominal pain, change in bowel habits or melena Genitourinary: negative for dysuria or gross hematuria Musculoskeletal: negative for new gait disturbance or muscular weakness, + right  hip pain Integumentary: negative for new or persistent rashes Neurological: negative for TIA or stroke symptoms Psychiatric: negative for SI or delusions Allergic/Immunologic: negative for hives  Patient Care Team    Relationship Specialty Notifications Start End  Leamon Arnt, MD PCP - General Family Medicine  12/09/17   Lyndal Pulley, DO Consulting Physician Family Medicine  12/09/17   Irene Shipper, MD Consulting Physician Gastroenterology  12/09/17     Objective  Vitals: BP (!) 102/58 (BP Location: Left Arm, Patient Position: Sitting, Cuff Size: Normal)   Pulse (!) 58   Temp 98.1 F (36.7 C) (Oral)   Ht 5' 2.25" (1.581 m)   Wt 182 lb 8 oz (82.8 kg)   SpO2 97%    BMI 33.11 kg/m  General:  Well developed, well nourished, no acute distress  Psych:  Alert and oriented,normal mood and affect HEENT:  Normocephalic, atraumatic, non-icteric sclera, PERRL, oropharynx is without mass or exudate, supple neck without adenopathy, mass or thyromegaly Cardiovascular:  RRR without gallop, rub or murmur, nondisplaced PMI Respiratory:  Good breath sounds bilaterally, CTAB with normal respiratory effort Gastrointestinal: normal bowel sounds, soft, non-tender, no noted masses. No HSM MSK: no deformities, contusions. Joints are without erythema or swelling Skin:  Warm, no rashes or suspicious lesions noted Neurologic:    Mental status is normal. Gross motor and sensory exams are normal. Normal gait Diabetic Foot Exam: Appearance - no lesions, ulcers or calluses Skin - no sigificant pallor or erythema Monofilament testing - sensitive bilaterally in following locations:  Right - Great toe, medial, central, lateral ball and posterior foot intact  Left - Great toe, medial, central, lateral ball and posterior foot intact Pulses - +2 distally bilaterally   Assessment  1. Controlled type 2 diabetes mellitus without complication, without long-term current use of insulin ([place])   2. Mixed hyperlipidemia   3. Need for hepatitis C screening test   4. Hypertension associated with diabetes ([place])   5. Hiatal hernia      Plan   Diabetic control has been good: Recheck today after steroid injections.  Stop glipizide.  Continue metformin and Victoza.  Adjust medications as needed based on A1c findings.  Continue diabetic diet.  Updated Prevnar today.  Shot is up-to-date.  Check urine microalbuminuria at next visit since patient cannot give sample today.  Restart ACE inhibitor at next visit counseling done  Hypertension: Blood pressure today without symptoms.  Decrease amlodipine to 5 mg daily.  Recheck next visit and institute ACE inhibitor again at that time.  Check renal  function and electrolytes  Hyperlipidemia on statin: Check for controlled today.  Hiatal hernia and history of esophagitis and chronic PPI therapy.  Continue  Health maintenance: Screen for hepatitis C.  Annual wellness visit is up-to-date, last done November 2018.  Health screenings are up-to-date.  Will be due for Pneumovax in 1 year.  Follow up:  Return in about 3 months (around 03/09/2018) for follow up of diabetes and hypertension.  Commons side effects, risks, benefits, and alternatives for medications and treatment plan prescribed today were discussed, and the patient expressed understanding of the given instructions. Patient is instructed to call or message via MyChart if he/she has any questions or concerns regarding our treatment plan. No barriers to understanding were identified. We discussed Red Flag symptoms and signs in detail. Patient expressed understanding regarding what to do in case of urgent or emergency type symptoms.   Medication list was reconciled, printed and provided to the  patient in AVS. Patient instructions and summary information was reviewed with the patient as documented in the AVS. This note was prepared with assistance of Dragon voice recognition software. Occasional wrong-word or sound-a-like substitutions may have occurred due to the inherent limitations of voice recognition software  Orders Placed This Encounter  Procedures  . Pneumococcal conjugate vaccine 13-valent  . HgB A1c  . CBC with Differential/Platelet  . Comprehensive metabolic panel  . Hepatitis C antibody  . Lipid panel   No orders of the defined types were placed in this encounter.

## 2017-12-10 ENCOUNTER — Other Ambulatory Visit: Payer: Self-pay | Admitting: Family Medicine

## 2017-12-10 DIAGNOSIS — D649 Anemia, unspecified: Secondary | ICD-10-CM

## 2017-12-10 LAB — HEPATITIS C ANTIBODY
HEP C AB: NONREACTIVE
SIGNAL TO CUT-OFF: 0.05 (ref <1.00)

## 2017-12-10 NOTE — Progress Notes (Signed)
Please call patient: I have reviewed his/her lab results. There are several things I'd like to discuss with her about her results. Please ask her to schedule a follow up appointment in 2-3 weeks to go over the results and discuss medication changes. I'm hoping to have her old records by that time. Also, please add iron studies; i've ordered them. As well will need urine test at her follow up visit. Thanks.

## 2017-12-24 ENCOUNTER — Ambulatory Visit: Payer: Medicare Other

## 2018-01-06 ENCOUNTER — Encounter: Payer: Self-pay | Admitting: Family Medicine

## 2018-01-06 ENCOUNTER — Ambulatory Visit (INDEPENDENT_AMBULATORY_CARE_PROVIDER_SITE_OTHER): Payer: Medicare Other | Admitting: Family Medicine

## 2018-01-06 VITALS — BP 104/68 | HR 63 | Temp 97.7°F | Wt 181.2 lb

## 2018-01-06 DIAGNOSIS — D649 Anemia, unspecified: Secondary | ICD-10-CM

## 2018-01-06 DIAGNOSIS — E119 Type 2 diabetes mellitus without complications: Secondary | ICD-10-CM

## 2018-01-06 DIAGNOSIS — M899 Disorder of bone, unspecified: Secondary | ICD-10-CM | POA: Diagnosis not present

## 2018-01-06 DIAGNOSIS — I10 Essential (primary) hypertension: Secondary | ICD-10-CM | POA: Diagnosis not present

## 2018-01-06 DIAGNOSIS — E1159 Type 2 diabetes mellitus with other circulatory complications: Secondary | ICD-10-CM

## 2018-01-06 DIAGNOSIS — E782 Mixed hyperlipidemia: Secondary | ICD-10-CM | POA: Diagnosis not present

## 2018-01-06 MED ORDER — LISINOPRIL 5 MG PO TABS: 5.0000 mg | ORAL_TABLET | Freq: Every day | ORAL | 3 refills | Status: DC

## 2018-01-06 NOTE — Patient Instructions (Addendum)
Please return in 3 months to recheck diabetes and cholesterol levels, come fasting please.   If you have any questions or concerns, please don't hesitate to send me a message via MyChart or call the office at [phone]. Thank you for visiting with Teresa Barnes today! It's our pleasure caring for you.  Today, we are changing your medications: STOP amlodipine and HCTZ. START lisinopril 5mg  daily. INCREASE your metformin to twice a day.  INCREASE your lovastatin to two tablets nightly.   Diabetes Mellitus and Nutrition When you have diabetes (diabetes mellitus), it is very important to have healthy eating habits because your blood sugar (glucose) levels are greatly affected by what you eat and drink. Eating healthy foods in the appropriate amounts, at about the same times every day, can help you:  Control your blood glucose.  Lower your risk of heart disease.  Improve your blood pressure.  Reach or maintain a healthy weight.  Every person with diabetes is different, and each person has different needs for a meal plan. Your health care provider may recommend that you work with a diet and nutrition specialist (dietitian) to make a meal plan that is best for you. Your meal plan may vary depending on factors such as:  The calories you need.  The medicines you take.  Your weight.  Your blood glucose, blood pressure, and cholesterol levels.  Your activity level.  Other health conditions you have, such as heart or kidney disease.  How do carbohydrates affect me? Carbohydrates affect your blood glucose level more than any other type of food. Eating carbohydrates naturally increases the amount of glucose in your blood. Carbohydrate counting is a method for keeping track of how many carbohydrates you eat. Counting carbohydrates is important to keep your blood glucose at a healthy level, especially if you use insulin or take certain oral diabetes medicines. It is important to know how many  carbohydrates you can safely have in each meal. This is different for every person. Your dietitian can help you calculate how many carbohydrates you should have at each meal and for snack. Foods that contain carbohydrates include:  Bread, cereal, rice, pasta, and crackers.  Potatoes and corn.  Peas, beans, and lentils.  Milk and yogurt.  Fruit and juice.  Desserts, such as cakes, cookies, ice cream, and candy.  How does alcohol affect me? Alcohol can cause a sudden decrease in blood glucose (hypoglycemia), especially if you use insulin or take certain oral diabetes medicines. Hypoglycemia can be a life-threatening condition. Symptoms of hypoglycemia (sleepiness, dizziness, and confusion) are similar to symptoms of having too much alcohol. If your health care provider says that alcohol is safe for you, follow these guidelines:  Limit alcohol intake to no more than 1 drink per day for nonpregnant women and 2 drinks per day for men. One drink equals 12 oz of beer, 5 oz of wine, or 1 oz of hard liquor.  Do not drink on an empty stomach.  Keep yourself hydrated with water, diet soda, or unsweetened iced tea.  Keep in mind that regular soda, juice, and other mixers may contain a lot of sugar and must be counted as carbohydrates.  What are tips for following this plan? Reading food labels  Start by checking the serving size on the label. The amount of calories, carbohydrates, fats, and other nutrients listed on the label are based on one serving of the food. Many foods contain more than one serving per package.  Check the total grams (g)  of carbohydrates in one serving. You can calculate the number of servings of carbohydrates in one serving by dividing the total carbohydrates by 15. For example, if a food has 30 g of total carbohydrates, it would be equal to 2 servings of carbohydrates.  Check the number of grams (g) of saturated and trans fats in one serving. Choose foods that have low  or no amount of these fats.  Check the number of milligrams (mg) of sodium in one serving. Most people should limit total sodium intake to less than 2,300 mg per day.  Always check the nutrition information of foods labeled as "low-fat" or "nonfat". These foods may be higher in added sugar or refined carbohydrates and should be avoided.  Talk to your dietitian to identify your daily goals for nutrients listed on the label. Shopping  Avoid buying canned, premade, or processed foods. These foods tend to be high in fat, sodium, and added sugar.  Shop around the outside edge of the grocery store. This includes fresh fruits and vegetables, bulk grains, fresh meats, and fresh dairy. Cooking  Use low-heat cooking methods, such as baking, instead of high-heat cooking methods like deep frying.  Cook using healthy oils, such as olive, canola, or sunflower oil.  Avoid cooking with butter, cream, or high-fat meats. Meal planning  Eat meals and snacks regularly, preferably at the same times every day. Avoid going long periods of time without eating.  Eat foods high in fiber, such as fresh fruits, vegetables, beans, and whole grains. Talk to your dietitian about how many servings of carbohydrates you can eat at each meal.  Eat 4-6 ounces of lean protein each day, such as lean meat, chicken, fish, eggs, or tofu. 1 ounce is equal to 1 ounce of meat, chicken, or fish, 1 egg, or 1/4 cup of tofu.  Eat some foods each day that contain healthy fats, such as avocado, nuts, seeds, and fish. Lifestyle   Check your blood glucose regularly.  Exercise at least 30 minutes 5 or more days each week, or as told by your health care provider.  Take medicines as told by your health care provider.  Do not use any products that contain nicotine or tobacco, such as cigarettes and e-cigarettes. If you need help quitting, ask your health care provider.  Work with a Social worker or diabetes educator to identify  strategies to manage stress and any emotional and social challenges. What are some questions to ask my health care provider?  Do I need to meet with a diabetes educator?  Do I need to meet with a dietitian?  What number can I call if I have questions?  When are the best times to check my blood glucose? Where to find more information:  American Diabetes Association: diabetes.org/food-and-fitness/food  Academy of Nutrition and Dietetics: PokerClues.dk  [employer] of Diabetes and Digestive and Kidney Diseases (NIH): ContactWire.be Summary  A healthy meal plan will help you control your blood glucose and maintain a healthy lifestyle.  Working with a diet and nutrition specialist (dietitian) can help you make a meal plan that is best for you.  Keep in mind that carbohydrates and alcohol have immediate effects on your blood glucose levels. It is important to count carbohydrates and to use alcohol carefully. This information is not intended to replace advice given to you by your health care provider. Make sure you discuss any questions you have with your health care provider. Document Released: 08/01/2005 Document Revised: 12/09/2016 Document Reviewed: 12/09/2016 Elsevier  Interactive Patient Education  [employer].

## 2018-01-06 NOTE — Addendum Note (Signed)
Addended by: Katina Dung on: 01/06/2018 11:49 AM   Modules accepted: Orders

## 2018-01-06 NOTE — Progress Notes (Signed)
Subjective  CC:  Chief Complaint  Patient presents with  . Diabetes  . Hypertension  . Hyperlipidemia    HPI: Teresa Barnes is a 72 y.o. female who presents to the office today for follow up of diabetes and problems listed above in the chief complaint. First visit with me was last month; here for short term f/u to go over results and discuss several medication changes.  Diabetes follow up: Her diabetic control is reported as Unchanged. Control is fair with a1c at 7.8. In part, worse due to repetitive steroid use in recent past.  She denies exertional CP or SOB or symptomatic hypoglycemia. She denies foot sores or paresthesias.   Hyperlipidemia - ldl is above goal on lovastatin 20 nightly.   Hypertension - runs low. On amlodipine and hctz but no ace. Needs urine MAC ratio testing.   Possible ckd - not on ace. Has chronic htn and dm.   Anemia - h/o iron deficiency due to gastric blood loss on iron and vit B12, but now with normocytic. Possible anemia due to chronic disease.   Immunization History  Administered Date(s) Administered  . Influenza, High Dose Seasonal PF 08/18/2017  . Influenza,inj,Quad PF,6+ Mos 08/01/2016  . Influenza-Unspecified 08/18/2014  . Pneumococcal Conjugate-13 12/09/2017    Diabetes Related Lab Review: Lab Results  Component Value Date   HGBA1C 7.9 (H) 12/09/2017   No results found for: Derl Barrow Lab Results  Component Value Date   CREATININE 1.46 (H) 12/09/2017   BUN 30 (H) 12/09/2017   NA 137 12/09/2017   K 4.5 12/09/2017   CL 101 12/09/2017   CO2 29 12/09/2017   Lab Results  Component Value Date   CHOL 203 (H) 12/09/2017   Lab Results  Component Value Date   HDL 57.60 12/09/2017   Lab Results  Component Value Date   LDLCALC 125 (H) 12/09/2017   Lab Results  Component Value Date   TRIG 103.0 12/09/2017   Lab Results  Component Value Date   CHOLHDL 4 12/09/2017   No results found for: LDLDIRECT The 10-year ASCVD  risk score Mikey Bussing DC Jr., et al., 2013) is: 22.8%   Values used to calculate the score:     Age: 22 years     Sex: Female     Is Non-Hispanic African American: Yes     Diabetic: Yes     Tobacco smoker: No     Systolic Blood Pressure: 361 mmHg     Is BP treated: Yes     HDL Cholesterol: 57.6 mg/dL     Total Cholesterol: 203 mg/dL I have reviewed the [place], [facility] and Soc history. Patient Active Problem List   Diagnosis Date Noted  . Mixed hyperlipidemia 12/09/2017    Priority: High  . Hypertension associated with diabetes ([place]) 12/09/2017    Priority: High  . H/O: upper GI bleed 07/30/2016    Priority: High  . Controlled type 2 diabetes mellitus without complication, without long-term current use of insulin ([place]) 04/03/2009    Priority: High  . History of right breast cancer 12/09/2017    Priority: Medium  . Degenerative arthritis of right knee 07/03/2017    Priority: Medium  . Right lumbar radiculopathy 01/23/2016    Priority: Medium  . Arthritis of right acromioclavicular joint 01/02/2016    Priority: Medium  . Greater trochanteric bursitis of right hip 11/17/2017    Priority: Low  . Normochromic anemia     Priority: Low  . Hiatal hernia  12/09/2017  . Esophageal stricture 12/09/2017  . Chronic gout 12/09/2017  . Glaucoma 12/09/2017    Social History: Patient  reports that she quit smoking about 24 years ago. Her smoking use included cigarettes. She has a 2.50 pack-year smoking history. she has never used smokeless tobacco. She reports that she does not drink alcohol or use drugs.  Review of Systems: Ophthalmic: negative for eye pain, loss of vision or double vision Cardiovascular: negative for chest pain Respiratory: negative for SOB or persistent cough Gastrointestinal: negative for abdominal pain Genitourinary: negative for dysuria or gross hematuria MSK: negative for foot lesions Neurologic: negative for weakness or gait disturbance  Objective  Vitals: BP 104/68 (BP  Location: Left Arm, Patient Position: Sitting, Cuff Size: Normal)   Pulse 63   Temp 97.7 F (36.5 C) (Oral)   Wt 181 lb 3.2 oz (82.2 kg)   BMI 32.88 kg/m  General: well appearing, no acute distress   No visits with results within 1 Day(s) from this visit.  Latest known visit with results is:  Office Visit on 12/09/2017  Component Date Value Ref Range Status  . Hgb A1c MFr Bld 12/09/2017 7.9* 4.6 - 6.5 % Final  . WBC 12/09/2017 7.0  4.0 - 10.5 K/uL Final  . RBC 12/09/2017 3.58* 3.87 - 5.11 Mil/uL Final  . Hemoglobin 12/09/2017 11.3* 12.0 - 15.0 g/dL Final  . HCT 12/09/2017 34.2* 36.0 - 46.0 % Final  . MCV 12/09/2017 95.5  78.0 - 100.0 fl Final  . MCHC 12/09/2017 32.9  30.0 - 36.0 g/dL Final  . RDW 12/09/2017 15.0  11.5 - 15.5 % Final  . Platelets 12/09/2017 354.0  150.0 - 400.0 K/uL Final  . Neutrophils Relative % 12/09/2017 63.0  43.0 - 77.0 % Final  . Lymphocytes Relative 12/09/2017 23.6  12.0 - 46.0 % Final  . Monocytes Relative 12/09/2017 8.4  3.0 - 12.0 % Final  . Eosinophils Relative 12/09/2017 3.8  0.0 - 5.0 % Final  . Basophils Relative 12/09/2017 1.2  0.0 - 3.0 % Final  . Neutro Abs 12/09/2017 4.4  1.4 - 7.7 K/uL Final  . Lymphs Abs 12/09/2017 1.6  0.7 - 4.0 K/uL Final  . Monocytes Absolute 12/09/2017 0.6  0.1 - 1.0 K/uL Final  . Eosinophils Absolute 12/09/2017 0.3  0.0 - 0.7 K/uL Final  . Basophils Absolute 12/09/2017 0.1  0.0 - 0.1 K/uL Final  . Sodium 12/09/2017 137  135 - 145 mEq/L Final  . Potassium 12/09/2017 4.5  3.5 - 5.1 mEq/L Final  . Chloride 12/09/2017 101  96 - 112 mEq/L Final  . CO2 12/09/2017 29  19 - 32 mEq/L Final  . Glucose, Bld 12/09/2017 177* 70 - 99 mg/dL Final  . BUN 12/09/2017 30* 6 - 23 mg/dL Final  . Creatinine, Ser 12/09/2017 1.46* 0.40 - 1.20 mg/dL Final  . Total Bilirubin 12/09/2017 0.6  0.2 - 1.2 mg/dL Final  . Alkaline Phosphatase 12/09/2017 65  39 - 117 U/L Final  . AST 12/09/2017 13  0 - 37 U/L Final  . ALT 12/09/2017 9  0 - 35 U/L  Final  . Total Protein 12/09/2017 7.0  6.0 - 8.3 g/dL Final  . Albumin 12/09/2017 4.1  3.5 - 5.2 g/dL Final  . Calcium 12/09/2017 10.2  8.4 - 10.5 mg/dL Final  . GFR 12/09/2017 45.13* >60.00 mL/min Final  . Hepatitis C Ab 12/09/2017 NON-REACTIVE  NON-REACTI Final  . SIGNAL TO CUT-OFF 12/09/2017 0.05  <1.00 Final  . Cholesterol 12/09/2017  203* 0 - 200 mg/dL Final  . Triglycerides 12/09/2017 103.0  0.0 - 149.0 mg/dL Final  . HDL 12/09/2017 57.60  >39.00 mg/dL Final  . VLDL 12/09/2017 20.6  0.0 - 40.0 mg/dL Final  . LDL Cholesterol 12/09/2017 125* 0 - 99 mg/dL Final  . Total CHOL/HDL Ratio 12/09/2017 4   Final  . NonHDL 12/09/2017 145.75   Final     Assessment  1. Controlled type 2 diabetes mellitus without complication, without long-term current use of insulin ([place])   2. Hypertension associated with diabetes ([place])   3. Mixed hyperlipidemia   4. Normochromic anemia      Plan   Diabetes is currently poorly controlled. Increase metformin to bid. Recheck 3 months.continue victoza.  Diabetic education: ongoing education regarding chronic disease management for diabetes was given today. We continue to reinforce the ABC's of diabetic management: A1c (<7 or 8 dependent upon patient), tight blood pressure control, and cholesterol management with goal LDL < 100 minimally. We discuss diet strategies, exercise recommendations, medication options and possible side effects. At each visit, we review recommended immunizations and preventive care recommendations for diabetics and stress that good diabetic control can prevent other problems. See below for this patient's data.  Hyperlipidemia: not at goal LDL. Increase lovastatin to 40 po qhs. Recheck 12 weeks. IF myalgias, then change to crestor.   HTN: stop meds; start low dose ace and follow.   CKD: recheck renal function at next visit; start ace. Need urine sample next visit; pt could not go today.   Anemia: further work up at next lab draw. Likely  will be able to stop iron supplements  HM - ordered dexa scan. Her first..   Follow up: 3 months.   Commons side effects, risks, benefits, and alternatives for medications and treatment plan prescribed today were discussed, and the patient expressed understanding of the given instructions. Patient is instructed to call or message via MyChart if he/she has any questions or concerns regarding our treatment plan. No barriers to understanding were identified. We discussed Red Flag symptoms and signs in detail. Patient expressed understanding regarding what to do in case of urgent or emergency type symptoms.   Medication list was reconciled, printed and provided to the patient in AVS. Patient instructions and summary information was reviewed with the patient as documented in the AVS. This note was prepared with assistance of Dragon voice recognition software. Occasional wrong-word or sound-a-like substitutions may have occurred due to the inherent limitations of voice recognition software  Orders Placed This Encounter  Procedures  . Microalbumin / creatinine urine ratio   No orders of the defined types were placed in this encounter.

## 2018-01-12 ENCOUNTER — Telehealth: Payer: Self-pay | Admitting: Emergency Medicine

## 2018-01-12 DIAGNOSIS — E2839 Other primary ovarian failure: Secondary | ICD-10-CM

## 2018-01-12 NOTE — Telephone Encounter (Signed)
Estrogen deficiency. You can always use this one for postmenopausal if they don't already have the dx of osteoporosis or osteopenia (from Problem list). thanks

## 2018-01-12 NOTE — Telephone Encounter (Signed)
Dr Jonni Sanger,    Please see CRM and advise which diagnosis code to use.    Thanks  AP  Reason for CRM: thew order for the dexa has the wrong diagnosis - it can be osteopenia, osteoporosis, or estrogen deficiency  - these are the only dx that medicare will cover.  Cb [id number] option 1, then 2

## 2018-01-12 NOTE — Telephone Encounter (Signed)
Order resent to [facility] with corrected diagnostic code. Per Dr. Jonni Sanger, use Estrogen Deficiency

## 2018-01-12 NOTE — Addendum Note (Signed)
Addended bySigurd Sos on: 01/12/2018 08:38 AM   Modules accepted: Orders

## 2018-01-13 ENCOUNTER — Other Ambulatory Visit: Payer: Self-pay | Admitting: Family Medicine

## 2018-01-13 NOTE — Telephone Encounter (Signed)
Copied from [place] [id number]. Topic: Quick Communication - Rx Refill/Question >> Jan 13, 2018 10:29 AM Percell Belt A wrote: Medication:  metFORMIN (GLUCOPHAGE) 1000 MG tablet [[id number]]   Has the patient contacted their pharmacy? {no   (Agent: If no, request that the patient contact the pharmacy for the refill.)   Preferred Pharmacy (with phone number or street name): [facility] -- requesting 90 day supply    Agent: Please be advised that RX refills may take up to 3 business days. We ask that you follow-up with your pharmacy.

## 2018-01-13 NOTE — Telephone Encounter (Signed)
Pt requesting a 90 day supply of Metformin, which was not previously prescribed by Dr. Jonni Sanger.  LOV: 01/06/18  PCP: Dr. Jonni Sanger  Pharmacy: [facility]

## 2018-01-14 MED ORDER — METFORMIN HCL 1000 MG PO TABS: 1000.0000 mg | ORAL_TABLET | Freq: Two times a day (BID) | ORAL | 3 refills | Status: DC

## 2018-01-20 ENCOUNTER — Ambulatory Visit: Payer: Medicare Other

## 2018-02-10 ENCOUNTER — Ambulatory Visit: Payer: Medicare Other

## 2018-02-16 ENCOUNTER — Ambulatory Visit
Admission: RE | Admit: 2018-02-16 | Discharge: 2018-02-16 | Disposition: A | Discharge status: Home / Self Care | Payer: Medicare Other | Source: Ambulatory Visit | Attending: Family Medicine | Admitting: Family Medicine

## 2018-02-16 DIAGNOSIS — E2839 Other primary ovarian failure: Secondary | ICD-10-CM

## 2018-02-16 DIAGNOSIS — Z1231 Encounter for screening mammogram for malignant neoplasm of breast: Secondary | ICD-10-CM

## 2018-02-16 NOTE — Progress Notes (Signed)
Please call patient: I have reviewed his/her lab results. Bone density results look good. They are normal. We will repeat this in 3-5 years.

## 2018-03-09 ENCOUNTER — Other Ambulatory Visit (INDEPENDENT_AMBULATORY_CARE_PROVIDER_SITE_OTHER): Payer: Medicare Other

## 2018-03-09 ENCOUNTER — Other Ambulatory Visit: Payer: Self-pay

## 2018-03-09 ENCOUNTER — Encounter: Payer: Self-pay | Admitting: Family Medicine

## 2018-03-09 ENCOUNTER — Ambulatory Visit (INDEPENDENT_AMBULATORY_CARE_PROVIDER_SITE_OTHER): Payer: Medicare Other | Admitting: Family Medicine

## 2018-03-09 VITALS — BP 132/78 | HR 68 | Temp 98.0°F | Ht 62.0 in | Wt 183.6 lb

## 2018-03-09 DIAGNOSIS — E1159 Type 2 diabetes mellitus with other circulatory complications: Secondary | ICD-10-CM | POA: Diagnosis not present

## 2018-03-09 DIAGNOSIS — E119 Type 2 diabetes mellitus without complications: Secondary | ICD-10-CM

## 2018-03-09 DIAGNOSIS — D649 Anemia, unspecified: Secondary | ICD-10-CM

## 2018-03-09 DIAGNOSIS — M1A9XX0 Chronic gout, unspecified, without tophus (tophi): Secondary | ICD-10-CM | POA: Diagnosis not present

## 2018-03-09 DIAGNOSIS — E782 Mixed hyperlipidemia: Secondary | ICD-10-CM | POA: Diagnosis not present

## 2018-03-09 DIAGNOSIS — I10 Essential (primary) hypertension: Secondary | ICD-10-CM

## 2018-03-09 DIAGNOSIS — I152 Hypertension secondary to endocrine disorders: Secondary | ICD-10-CM

## 2018-03-09 LAB — COMPREHENSIVE METABOLIC PANEL
ALBUMIN: 4.2 g/dL (ref 3.5–5.2)
ALK PHOS: 54 U/L (ref 39–117)
ALT: 12 U/L (ref 0–35)
AST: 16 U/L (ref 0–37)
BUN: 29 mg/dL — AB (ref 6–23)
CO2: 22 mEq/L (ref 19–32)
CREATININE: 1.2 mg/dL (ref 0.40–1.20)
Calcium: 9.7 mg/dL (ref 8.4–10.5)
Chloride: 100 mEq/L (ref 96–112)
GFR: 56.55 mL/min — ABNORMAL LOW (ref >60.00)
Glucose, Bld: 146 mg/dL — ABNORMAL HIGH (ref 70–99)
POTASSIUM: 4.6 meq/L (ref 3.5–5.1)
SODIUM: 138 meq/L (ref 135–145)
TOTAL PROTEIN: 6.7 g/dL (ref 6.0–8.3)
Total Bilirubin: 0.6 mg/dL (ref 0.2–1.2)

## 2018-03-09 LAB — IRON,TIBC AND FERRITIN PANEL
%SAT: 32 % (calc) (ref 11–50)
FERRITIN: 21 ng/mL (ref 20–288)
Iron: 103 ug/dL (ref 45–160)
TIBC: 323 mcg/dL (calc) (ref 250–450)

## 2018-03-09 LAB — MICROALBUMIN / CREATININE URINE RATIO
CREATININE, U: 48.9 mg/dL
MICROALB UR: 8.1 mg/dL — AB (ref 0.0–1.9)
Microalb Creat Ratio: 16.6 mg/g (ref 0.0–30.0)

## 2018-03-09 LAB — LIPID PANEL
CHOLESTEROL: 175 mg/dL (ref 0–200)
HDL: 64.3 mg/dL (ref >39.00)
LDL Cholesterol: 95 mg/dL (ref 0–99)
NonHDL: 110.92
Total CHOL/HDL Ratio: 3
Triglycerides: 82 mg/dL (ref 0.0–149.0)
VLDL: 16.4 mg/dL (ref 0.0–40.0)

## 2018-03-09 LAB — IBC PANEL
IRON: 110 ug/dL (ref 42–145)
SATURATION RATIOS: 30.3 % (ref 20.0–50.0)
TRANSFERRIN: 259 mg/dL (ref 212.0–360.0)

## 2018-03-09 LAB — URIC ACID: URIC ACID, SERUM: 6.1 mg/dL (ref 2.4–7.0)

## 2018-03-09 LAB — VITAMIN B12: Vitamin B-12: 618 pg/mL (ref 211–911)

## 2018-03-09 LAB — FERRITIN: FERRITIN: 19.5 ng/mL (ref 10.0–291.0)

## 2018-03-09 LAB — POCT GLYCOSYLATED HEMOGLOBIN (HGB A1C): Hemoglobin A1C: 6.1

## 2018-03-09 MED ORDER — ALLOPURINOL 100 MG PO TABS: 100.0000 mg | ORAL_TABLET | Freq: Every day | ORAL | 3 refills | Status: DC

## 2018-03-09 NOTE — Progress Notes (Unsigned)
[facility] 

## 2018-03-09 NOTE — Progress Notes (Signed)
Subjective  CC:  Chief Complaint  Patient presents with  . Diabetes    needs refills on metformin & allopurinol     HPI: Teresa Barnes is a 72 y.o. female who presents to the office today for follow up of diabetes and problems listed above in the chief complaint.   Diabetes follow up: Her diabetic control is reported as Improved. Since stopping all steroids and eating well, sugars are better.  She denies exertional CP or SOB or symptomatic hypoglycemia. She denies foot sores or paresthesias.   Hyperlipidemia: f/u after doubling statin dose 8 weeks ago. Tolerating fine. Goal LDL < 70  CKD: new dx for patient. Now on ace. Check urine levels and recheck creatinine today. No edema.   HTN: stopped amlodipine and hctz and started ace. Tolerating well. bp is fairly well controlled.   Gout: last flare years ago: on allopurinol. Once daily. Needs refill.   Anemia: needs further eval. On vit b12 only. Stopped iron. Feels fine.   Immunization History  Administered Date(s) Administered  . Influenza, High Dose Seasonal PF 08/18/2017  . Influenza,inj,Quad PF,6+ Mos 08/01/2016  . Influenza-Unspecified 08/18/2014  . Pneumococcal Conjugate-13 12/09/2017    Diabetes Related Lab Review: Lab Results  Component Value Date   HGBA1C 6.1 03/09/2018   HGBA1C 7.9 (H) 12/09/2017      No results found for: Derl Barrow Lab Results  Component Value Date   CREATININE 1.46 (H) 12/09/2017   BUN 30 (H) 12/09/2017   NA 137 12/09/2017   K 4.5 12/09/2017   CL 101 12/09/2017   CO2 29 12/09/2017   Lab Results  Component Value Date   CHOL 203 (H) 12/09/2017   Lab Results  Component Value Date   HDL 57.60 12/09/2017   Lab Results  Component Value Date   LDLCALC 125 (H) 12/09/2017   Lab Results  Component Value Date   TRIG 103.0 12/09/2017   Lab Results  Component Value Date   CHOLHDL 4 12/09/2017   No results found for: LDLDIRECT The 10-year ASCVD risk score Mikey Bussing DC Jr., et  al., 2013) is: 32.2%   Values used to calculate the score:     Age: 7 years     Sex: Female     Is Non-Hispanic African American: Yes     Diabetic: Yes     Tobacco smoker: No     Systolic Blood Pressure: 621 mmHg     Is BP treated: Yes     HDL Cholesterol: 57.6 mg/dL     Total Cholesterol: 203 mg/dL I have reviewed the [place], Fam and Soc history. Patient Active Problem List   Diagnosis Date Noted  . Mixed hyperlipidemia 12/09/2017    Priority: High  . Hypertension associated with diabetes ([place]) 12/09/2017    Priority: High  . H/O: upper GI bleed 07/30/2016    Priority: High    Due to severe erosive esophagitis; 2017; healed with PPI by egd f/u; has [place] and stricture; s/p blood transfusion. Negative for [place] esophagitis   . Controlled type 2 diabetes mellitus without complication, without long-term current use of insulin ([place]) 04/03/2009    Priority: High    dxd 1990; well controlled    . History of right breast cancer 12/09/2017    Priority: Medium    S/p mastectomy 1990   . Degenerative arthritis of right knee 07/03/2017    Priority: Medium    Injected 07/03/2017   . Right lumbar radiculopathy 01/23/2016    Priority:  Medium  . Arthritis of right acromioclavicular joint 01/02/2016    Priority: Medium    Injected 01/02/2016 Repeat injection 03/12/2016   . Greater trochanteric bursitis of right hip 11/17/2017    Priority: Low    Injected November 17, 2017   . Normochromic anemia     Priority: Low  . Hiatal hernia 12/09/2017  . Esophageal stricture 12/09/2017    S/p dilation by GI: Dr. Scarlette Shorts; due to h/o severe recurrent erosive esophagitis.   . Chronic gout 12/09/2017    On allopurinol   . Glaucoma 12/09/2017    Social History: Patient  reports that she quit smoking about 24 years ago. Her smoking use included cigarettes. She has a 2.50 pack-year smoking history. She has never used smokeless tobacco. She reports that she does not drink alcohol or use  drugs.  Review of Systems: Ophthalmic: negative for eye pain, loss of vision or double vision Cardiovascular: negative for chest pain Respiratory: negative for SOB or persistent cough Gastrointestinal: negative for abdominal pain Genitourinary: negative for dysuria or gross hematuria MSK: negative for foot lesions Neurologic: negative for weakness or gait disturbance  Objective  Vitals: BP 132/78   Pulse 68   Temp 98 F (36.7 C)   Ht 5\' 2"  (1.575 m)   Wt 183 lb 9.6 oz (83.3 kg)   BMI 33.58 kg/m  General: well appearing, no acute distress  Psych:  Alert and oriented, normal mood and affect HEENT:  Normocephalic, atraumatic, moist mucous membranes, supple neck  Cardiovascular:  Nl S1 and S2, RRR without murmur, gallop or rub. no edema Respiratory:  Good breath sounds bilaterally, CTAB with normal effort, no rales Gastrointestinal: normal BS, soft, nontender Skin:  Warm, no rashes Neurologic:   Mental status is normal. normal gait Foot exam: no erythema, pallor, or cyanosis visible nl proprioception and sensation to monofilament testing bilaterally, +2 distal pulses bilaterally    Assessment  1. Controlled type 2 diabetes mellitus without complication, without long-term current use of insulin ([place])   2. Hypertension associated with diabetes ([place])   3. Mixed hyperlipidemia   4. Normochromic anemia   5. Chronic gout without tophus, unspecified cause, unspecified site      Plan   Diabetes is currently very well controlled. Continue same now.   Recheck lipids on higher dose statin. May need stronger statin to get to goal.  BP is fairly well controlled on ace. Recheck 3 months. Check renal function and electrolytes.  Anemia: start eval.   Gout: check uric acid and refilled.  Diabetic education: ongoing education regarding chronic disease management for diabetes was given today. We continue to reinforce the ABC's of diabetic management: A1c (<7 or 8 dependent upon patient),  tight blood pressure control, and cholesterol management with goal LDL < 100 minimally. We discuss diet strategies, exercise recommendations, medication options and possible side effects. At each visit, we review recommended immunizations and preventive care recommendations for diabetics and stress that good diabetic control can prevent other problems. See below for this patient's data.  Follow up: 3 months for recheck DM, HTN, lipids, and anemia.   Commons side effects, risks, benefits, and alternatives for medications and treatment plan prescribed today were discussed, and the patient expressed understanding of the given instructions. Patient is instructed to call or message via MyChart if he/she has any questions or concerns regarding our treatment plan. No barriers to understanding were identified. We discussed Red Flag symptoms and signs in detail. Patient expressed understanding regarding what to  do in case of urgent or emergency type symptoms.   Medication list was reconciled, printed and provided to the patient in AVS. Patient instructions and summary information was reviewed with the patient as documented in the AVS. This note was prepared with assistance of Dragon voice recognition software. Occasional wrong-word or sound-a-like substitutions may have occurred due to the inherent limitations of voice recognition software  Orders Placed This Encounter  Procedures  . Comprehensive metabolic panel  . Lipid panel  . Vitamin B12  . Folate RBC  . Microalbumin / creatinine urine ratio  . Uric acid  . POCT glycosylated hemoglobin (Hb A1C)   Meds ordered this encounter  Medications  . allopurinol (ZYLOPRIM) 100 MG tablet    Sig: Take 1 tablet (100 mg total) by mouth daily.    Dispense:  90 tablet    Refill:  3

## 2018-03-09 NOTE — Patient Instructions (Signed)
Please return in 3 months for recheck diabetes, blood pressure, anemia  If you have any questions or concerns, please don't hesitate to send me a message via MyChart or call the office at [phone]. Thank you for visiting with [place] today! It's our pleasure caring for you.  We did not change your medications today.

## 2018-03-10 LAB — FOLATE RBC: RBC FOLATE: 996 ng/mL (ref >280)

## 2018-03-10 NOTE — Progress Notes (Signed)
Please call patient: I have reviewed his/her lab results. Everything looks better. Renal function has improved. Iron levels are ok as are vit B12 levels. No medication changes recommended at this time. We will recheck the blood counts at her follow up visit to ensure she is stable off iron. tjhanks.

## 2018-03-18 ENCOUNTER — Telehealth: Payer: Self-pay | Admitting: Emergency Medicine

## 2018-03-18 NOTE — Telephone Encounter (Signed)
Prescription from Pharmacy for Lovastatin 20mg . Patient states she takes twice daily. I do not see where twice daily was ordered. Please advise.

## 2018-03-20 ENCOUNTER — Telehealth: Payer: Self-pay | Admitting: Family Medicine

## 2018-03-20 ENCOUNTER — Telehealth: Payer: Self-pay | Admitting: Endocrinology

## 2018-03-20 MED ORDER — LOVASTATIN 20 MG PO TABS: 40.0000 mg | ORAL_TABLET | Freq: Every day | ORAL | 3 refills | Status: DC

## 2018-03-20 NOTE — Telephone Encounter (Signed)
I can't see where I have ever seen this pt.

## 2018-03-20 NOTE — Telephone Encounter (Signed)
I informed pharmacy that patient had never been seen here.

## 2018-03-20 NOTE — Telephone Encounter (Signed)
lovastatin (MEVACOR) 20 MG tablet  [facility] pharmacy would like a call back to change the MG of the patient prescription.   Please advise   [phone] (Phone)  [place], [place] - [address]

## 2018-03-20 NOTE — Telephone Encounter (Signed)
Copied from [place] [phone]. Topic: Quick Communication - Rx Refill/Question >> Mar 20, 2018  9:50 AM Arletha Grippe wrote: Medication: lovastatin (MEVACOR) 20 MG tablet Has the patient contacted their pharmacy? Yes.   (Agent: If no, request that the patient contact the pharmacy for the refill.) Preferred Pharmacy (with phone number or street name): [facility] [address]  Agent: Please be advised that RX refills may take up to 3 business days. We ask that you follow-up with your pharmacy. Pt was told to increase dose, too soon to get refill at pharm.  Pt is now taking 40 mg

## 2018-03-20 NOTE — Telephone Encounter (Signed)
Please refill. She is on simvastatin 20mg , but takes 40mg  daily; she should be taking both pills together at night. Needs 180 with 3 refills.

## 2018-03-20 NOTE — Telephone Encounter (Signed)
Prescription sent in for 180 with 3 RF's  Alorah Mcree,  LPN

## 2018-03-20 NOTE — Telephone Encounter (Signed)
Refill sent on 45/3/19 to requested pharmacy

## 2018-03-23 ENCOUNTER — Telehealth: Payer: Self-pay | Admitting: Family Medicine

## 2018-03-23 NOTE — Telephone Encounter (Signed)
Request from Pharmacy, Insurance will not pay for 2- a day. Patient needs 40mg  tablets.   Okay to send?

## 2018-03-23 NOTE — Telephone Encounter (Signed)
Copied from [place] [id number]. Topic: Quick Communication - Rx Refill/Question >> Mar 23, 2018 11:06 AM Lennox Solders wrote: Medication: pt needs lovastatin 40 mg not 20 mg Has the patient contacted their pharmacy?yes  Preferred Pharmacy (with phone number [facility]): [facility] [address] Agent: Please be advised that RX refills may take up to 3 business days. We ask that you follow-up with your pharmacy. Pt needs new rx lovastatin 40 mg. Pt was told to increase 20 mg to 40 mg. Pt insurance will not pay for 20 mg take 2 pills

## 2018-03-23 NOTE — Telephone Encounter (Signed)
yes

## 2018-03-24 ENCOUNTER — Other Ambulatory Visit: Payer: Self-pay

## 2018-03-24 MED ORDER — LOVASTATIN 40 MG PO TABS: 40.0000 mg | ORAL_TABLET | Freq: Every day | ORAL | 0 refills | Status: DC

## 2018-03-24 NOTE — Telephone Encounter (Signed)
I do not see that I have this encounter open??

## 2018-03-24 NOTE — Telephone Encounter (Signed)
This medication has been sent to the [facility] on [address].

## 2018-03-24 NOTE — Telephone Encounter (Signed)
Please close this encounter

## 2018-03-26 ENCOUNTER — Telehealth: Payer: Self-pay | Admitting: Family Medicine

## 2018-03-26 NOTE — Telephone Encounter (Signed)
Copied from [place] [phone]. Topic: Quick Communication - See Telephone Encounter >> Mar 26, 2018  9:34 AM Ivar Drape wrote: CRM for notification. See Telephone encounter for: 03/26/18. Patient would like a refill on her [place] 74 MG/3ML SOPN medication and sent to her preferred pharmacy [facility] [address]. Patient is completely out of the medication.

## 2018-03-27 ENCOUNTER — Other Ambulatory Visit: Payer: Self-pay | Admitting: Emergency Medicine

## 2018-03-27 MED ORDER — VICTOZA 18 MG/3ML ~~LOC~~ SOPN: 1.8000 mg | PEN_INJECTOR | Freq: Every day | SUBCUTANEOUS | 3 refills | Status: DC

## 2018-03-27 NOTE — Telephone Encounter (Signed)
Medication filled on 5/10

## 2018-03-27 NOTE — Telephone Encounter (Signed)
Received and reviewed medication refill request.  Request is appropriate and was approved.  Please see medication orders for details.  

## 2018-03-27 NOTE — Telephone Encounter (Signed)
Rx Request for Victoza 18mg /36ml   Ok for Refill?

## 2018-04-07 ENCOUNTER — Ambulatory Visit: Payer: Medicare Other | Admitting: Family Medicine

## 2018-04-21 NOTE — Progress Notes (Deleted)
[name] [place], [place] [address] Phone: [phone] Subjective:    I'm seeing this patient by the request  of:    CC:   [id number]  Teresa Barnes is a 72 y.o. female coming in with complaint of ***  Onset-  Location Duration-  Character- Aggravating factors- Reliving factors-  Therapies tried-  Severity-     Past Medical History:  Diagnosis Date  . Anemia   . Blood transfusion without reported diagnosis 07/2016  . Cancer ([place]) 1991   breast  . Chronic gout 12/09/2017   On allopurinol  . Diabetes mellitus without complication ([place])   . Esophageal stricture 12/09/2017   S/p dilation by GI: Dr. Scarlette Shorts; due to h/o severe recurrent erosive esophagitis.  . 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 GERD (gastroesophageal reflux disease)   . Glaucoma   . Hiatal hernia 12/09/2017  . Hyperlipidemia   . Hypertension   . Right rotator cuff tear 11/24/2015   Injected this and 26 2017  . Wears glasses    Past Surgical History:  Procedure Laterality Date  . CATARACT EXTRACTION W/ INTRAOCULAR LENS  IMPLANT, BILATERAL  2000  . COLONOSCOPY    . cyst on spine    . ESOPHAGOGASTRODUODENOSCOPY (EGD) WITH PROPOFOL N/A 07/31/2016   Procedure: ESOPHAGOGASTRODUODENOSCOPY (EGD) WITH PROPOFOL;  Surgeon: Manus Gunning, MD;  Location: [facility];  Service: Gastroenterology;  Laterality: N/A;  . EYE SURGERY  2011   lt eye blled  . MASTECTOMY MODIFIED RADICAL Right 1990  . REDUCTION MAMMAPLASTY Left   . TRIGGER FINGER RELEASE Right 06/24/2013   Procedure: RELEASE TRIGGER FINGER/A-1 PULLEY PIP RIGHT LONG FINGER;  Surgeon: Cammie Sickle., MD;  Location: [facility];  Service: Orthopedics;  Laterality: Right;  . TRIGGER FINGER RELEASE    . TUBAL LIGATION     Social History   Socioeconomic History  . Marital status: Married    Spouse name: robert  . Number of children: 2  .  Years of education: Not on file  . Highest education level: Not on file  Occupational History  . Occupation: retired Educational psychologist; [employer]    Comment: 2006  Social Needs  . Financial resource strain: Not on file  . Food insecurity:    Worry: Not on file    Inability: Not on file  . Transportation needs:    Medical: Not on file    Non-medical: Not on file  Tobacco Use  . Smoking status: Former Smoker    Packs/day: 0.25    Years: 10.00    Pack years: 2.50    Types: Cigarettes    Last attempt to quit: 06/18/1993    Years since quitting: 24.8  . Smokeless tobacco: Never Used  Substance and Sexual Activity  . Alcohol use: No  . Drug use: No  . Sexual activity: Never  Lifestyle  . Physical activity:    Days per week: Not on file    Minutes per session: Not on file  . Stress: Not on file  Relationships  . Social connections:    Talks on phone: Not on file    Gets together: Not on file    Attends religious service: Not on file    Active member of club or organization: Not on file    Attends meetings of clubs or organizations: Not on file    Relationship status: Not on file  Other Topics Concern  . Not on file  Social History Narrative   Originally from [place], [place]; worked in [employer] for [place]; longevity in family   Allergies  Allergen Reactions  . Prednisone Nausea And Vomiting  . Tramadol Nausea Only   Family History  Problem Relation Age of Onset  . Diabetes Mother   . Diabetes Daughter   . Arthritis Daughter   . Hyperlipidemia Daughter   . Diabetes Son   . Arthritis Son   . Diabetes Sister   . Colon cancer Neg Hx   . Cancer Neg Hx   . Heart disease Neg Hx   . Breast cancer Neg Hx      Past medical history, social, surgical and family history all reviewed in electronic medical record.  No pertanent information unless stated regarding to the chief complaint.   Review of Systems:Review of systems updated and as accurate as of 04/21/18  No headache,  visual changes, nausea, vomiting, diarrhea, constipation, dizziness, abdominal pain, skin rash, fevers, chills, night sweats, weight loss, swollen lymph nodes, body aches, joint swelling, muscle aches, chest pain, shortness of breath, mood changes.   Objective  There were no vitals taken for this visit. Systems examined below as of 04/21/18   General: No apparent distress alert and oriented x3 mood and affect normal, dressed appropriately.  HEENT: Pupils equal, extraocular movements intact  Respiratory: Patient's speak in full sentences and does not appear short of breath  Cardiovascular: No lower extremity edema, non tender, no erythema  Skin: Warm dry intact with no signs of infection or rash on extremities or on axial skeleton.  Abdomen: Soft nontender  Neuro: Cranial nerves II through XII are intact, neurovascularly intact in all extremities with 2+ DTRs and 2+ pulses.  Lymph: No lymphadenopathy of posterior or anterior cervical chain or axillae bilaterally.  Gait normal with good balance and coordination.  MSK:  Non tender with full range of motion and good stability and symmetric strength and tone of shoulders, elbows, wrist, hip, knee and ankles bilaterally.     Impression and Recommendations:     This case required medical decision making of moderate complexity.      Note: This dictation was prepared with Dragon dictation along with smaller phrase technology. Any transcriptional errors that result from this process are unintentional.

## 2018-04-22 ENCOUNTER — Ambulatory Visit: Payer: Medicare Other | Admitting: Family Medicine

## 2018-04-29 ENCOUNTER — Other Ambulatory Visit: Payer: Self-pay | Admitting: Family Medicine

## 2018-04-29 MED ORDER — NOVOFINE PLUS 32G X 4 MM MISC: 2 refills | Status: DC

## 2018-04-29 NOTE — Telephone Encounter (Signed)
Rx refill request : Novofine Plus  32Gx 4 mm  Historical prescription   LOV: 03/09/18  PCP: [place]: verified

## 2018-04-29 NOTE — Telephone Encounter (Signed)
Copied from [place] [id number]. Topic: Quick Communication - Rx Refill/Question >> Apr 29, 2018 11:04 AM Neva Seat wrote: NOVOFINE PLUS 32G X 4 MM (16) [place] refills  [facility] [place], [place] [address] [place]  [place] [address] Phone: [phone] Fax: [phone]

## 2018-05-05 ENCOUNTER — Encounter: Payer: Self-pay | Admitting: Family Medicine

## 2018-05-05 ENCOUNTER — Ambulatory Visit: Payer: Medicare Other | Admitting: Family Medicine

## 2018-05-05 ENCOUNTER — Encounter

## 2018-05-05 ENCOUNTER — Ambulatory Visit: Payer: Self-pay

## 2018-05-05 VITALS — BP 124/50 | HR 67 | Ht 62.0 in | Wt 183.0 lb

## 2018-05-05 DIAGNOSIS — M25551 Pain in right hip: Secondary | ICD-10-CM

## 2018-05-05 DIAGNOSIS — M1711 Unilateral primary osteoarthritis, right knee: Secondary | ICD-10-CM | POA: Diagnosis not present

## 2018-05-05 DIAGNOSIS — M7061 Trochanteric bursitis, right hip: Secondary | ICD-10-CM

## 2018-05-05 NOTE — Assessment & Plan Note (Signed)
Significant arthritic changes noted.  Discussed icing regimen.  Likely contributing to some of the discomfort pain

## 2018-05-05 NOTE — Assessment & Plan Note (Signed)
Patient given repeat injection.  I do believe that there is underlying arthritis is likely contributing.  I also believe that there is a nerve root impingement patient does not want any type of injection does not want any surgical intervention.  Patient does have some good relief whenever she does have this injection.  We discussed icing regimen and topical anti-inflammatories.  Patient will try to make certain changes and come back and see me again in 4 weeks.

## 2018-05-05 NOTE — Patient Instructions (Signed)
Good to see you  I am sorry I do not have the magic wand.  As long as the injection works we can repeat every 3 months  Ice 20 minutes 2 times daily. Usually after activity and before bed. pennsaid pinkie amount topically 2 times daily as needed.  See me again in 3 months if you need me

## 2018-05-05 NOTE — Progress Notes (Signed)
[name] [address] [place], [place] [address] Phone: [phone] Subjective:     CC: Pain follow-up  OIN:[id number]  Teresa Barnes is a 72 y.o. female coming in with complaint of right hip pain.  Has had this before.  Has responded somewhat to greater trochanteric injections.  Last one was 7 months ago.  Also has had back pain.  Did not respond well to epidurals.  Known arthritic changes of the right knee but states that the knee is not giving her more problems.  Denies any increasing instability.  Pain seems to be worse at night.  Denies any fevers chills or any abnormal weight loss     Past Medical History:  Diagnosis Date  . Anemia   . Blood transfusion without reported diagnosis 07/2016  . Cancer ([place]) 1991   breast  . Chronic gout 12/09/2017   On allopurinol  . Diabetes mellitus without complication ([place])   . Esophageal stricture 12/09/2017   S/p dilation by GI: Dr. Scarlette Shorts; due to h/o severe recurrent erosive esophagitis.  . 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 GERD (gastroesophageal reflux disease)   . Glaucoma   . Hiatal hernia 12/09/2017  . Hyperlipidemia   . Hypertension   . Right rotator cuff tear 11/24/2015   Injected this and 26 2017  . Wears glasses    Past Surgical History:  Procedure Laterality Date  . CATARACT EXTRACTION W/ INTRAOCULAR LENS  IMPLANT, BILATERAL  2000  . COLONOSCOPY    . cyst on spine    . ESOPHAGOGASTRODUODENOSCOPY (EGD) WITH PROPOFOL N/A 07/31/2016   Procedure: ESOPHAGOGASTRODUODENOSCOPY (EGD) WITH PROPOFOL;  Surgeon: Manus Gunning, MD;  Location: [facility];  Service: Gastroenterology;  Laterality: N/A;  . EYE SURGERY  2011   lt eye blled  . MASTECTOMY MODIFIED RADICAL Right 1990  . REDUCTION MAMMAPLASTY Left   . TRIGGER FINGER RELEASE Right 06/24/2013   Procedure: RELEASE TRIGGER FINGER/A-1 PULLEY PIP RIGHT LONG FINGER;  Surgeon: Cammie Sickle., MD;   Location: [facility];  Service: Orthopedics;  Laterality: Right;  . TRIGGER FINGER RELEASE    . TUBAL LIGATION     Social History   Socioeconomic History  . Marital status: Married    Spouse name: robert  . Number of children: 2  . Years of education: Not on file  . Highest education level: Not on file  Occupational History  . Occupation: retired Educational psychologist; [employer]ologist    Comment: 2006  Social Needs  . Financial resource strain: Not on file  . Food insecurity:    Worry: Not on file    Inability: Not on file  . Transportation needs:    Medical: Not on file    Non-medical: Not on file  Tobacco Use  . Smoking status: Former Smoker    Packs/day: 0.25    Years: 10.00    Pack years: 2.50    Types: Cigarettes    Last attempt to quit: 06/18/1993    Years since quitting: 24.8  . Smokeless tobacco: Never Used  Substance and Sexual Activity  . Alcohol use: No  . Drug use: No  . Sexual activity: Never  Lifestyle  . Physical activity:    Days per week: Not on file    Minutes per session: Not on file  . Stress: Not on file  Relationships  . Social connections:    Talks on phone: Not on  file    Gets together: Not on file    Attends religious service: Not on file    Active member of club or organization: Not on file    Attends meetings of clubs or organizations: Not on file    Relationship status: Not on file  Other Topics Concern  . Not on file  Social History Narrative   Originally from [place], [place]; worked in [employer] for [place]; longevity in family   Allergies  Allergen Reactions  . Prednisone Nausea And Vomiting  . Tramadol Nausea Only   Family History  Problem Relation Age of Onset  . Diabetes Mother   . Diabetes Daughter   . Arthritis Daughter   . Hyperlipidemia Daughter   . Diabetes Son   . Arthritis Son   . Diabetes Sister   . Colon cancer Neg Hx   . Cancer Neg Hx   . Heart disease Neg Hx   . Breast cancer Neg Hx      Past medical  history, social, surgical and family history all reviewed in electronic medical record.  No pertanent information unless stated regarding to the chief complaint.   Review of Systems:Review of systems updated and as accurate as of 05/05/18  No headache, visual changes, nausea, vomiting, diarrhea, constipation, dizziness, abdominal pain, skin rash, fevers, chills, night sweats, weight loss, swollen lymph nodes,, chest pain, shortness of breath, mood changes.  Positive muscle aches, joint swelling and body aches  Objective  Blood pressure (!) 124/50, pulse 67, height 5\' 2"  (1.575 m), weight 183 lb (83 kg), SpO2 96 %. Systems examined below as of 05/05/18   General: No apparent distress alert and oriented x3 mood and affect normal, dressed appropriately.  HEENT: Pupils equal, extraocular movements intact  Respiratory: Patient's speak in full sentences and does not appear short of breath  Cardiovascular: No lower extremity edema, non tender, no erythema  Skin: Warm dry intact with no signs of infection or rash on extremities or on axial skeleton.  Abdomen: Soft nontender  Neuro: Cranial nerves II through XII are intact, neurovascularly intact in all extremities with 2+ DTRs and 2+ pulses.  Lymph: No lymphadenopathy of posterior or anterior cervical chain or axillae bilaterally.  Gait antalgic MSK:  Non tender with full range of motion and good stability and symmetric strength and tone of shoulders, elbows, wrist,  and ankles bilaterally.  Right hip exam shows severe tenderness over the right greater trochanteric area.  Mild limited range of motion in [place] as well as internal range of motion.  Patient does have significant instability of the right knee with arthritic changes.  Moderate to severe tenderness over the right sacroiliac joint and the paraspinal musculature lumbar spine   Procedure: Real-time Ultrasound Guided Injection of right greater trochanteric bursitis secondary to patient's body  habitus Device: GE Logiq Q7 Ultrasound guided injection is preferred based studies that show increased duration, increased effect, greater accuracy, decreased procedural pain, increased response rate, and decreased cost with ultrasound guided versus blind injection.  Verbal informed consent obtained.  Time-out conducted.  Noted no overlying erythema, induration, or other signs of local infection.  Skin prepped in a sterile fashion.  Local anesthesia: Topical Ethyl chloride.  With sterile technique and under real time ultrasound guidance:  Greater trochanteric area was visualized and patient's bursa was noted. A 22-gauge 3 inch needle was inserted and 4 cc of 0.5% Marcaine and 1 cc of Kenalog 40 mg/dL was injected. Pictures taken Completed without difficulty  Pain immediately  resolved suggesting accurate placement of the medication.  Advised to call if fevers/chills, erythema, induration, drainage, or persistent bleeding.  Images permanently stored and available for review in the ultrasound unit.  Impression: Technically successful ultrasound guided injection.    Impression and Recommendations:     This case required medical decision making of moderate complexity.      Note: This dictation was prepared with Dragon dictation along with smaller phrase technology. Any transcriptional errors that result from this process are unintentional.

## 2018-05-07 ENCOUNTER — Telehealth: Payer: Self-pay | Admitting: Emergency Medicine

## 2018-05-07 NOTE — Telephone Encounter (Signed)
Patient came in this afternoon stating that she was at Sports Medicine on Tuesday and her blood pressure was 124/50. She states that she had just taken her medication about an hour before. She just wanted to get her Blood Pressure Checked. Blood Pressure today was 118/60. Patient states that she is feeling fine, I advised her if she experiences any symptoms to let us know. Patient verbalized understanding.  Doloris Hall,  LPN

## 2018-05-07 NOTE — Telephone Encounter (Signed)
Noted  

## 2018-05-26 ENCOUNTER — Encounter: Payer: Self-pay | Admitting: Sports Medicine

## 2018-05-26 ENCOUNTER — Ambulatory Visit: Payer: Medicare Other | Admitting: Sports Medicine

## 2018-05-26 DIAGNOSIS — B351 Tinea unguium: Secondary | ICD-10-CM | POA: Diagnosis not present

## 2018-05-26 DIAGNOSIS — M79675 Pain in left toe(s): Secondary | ICD-10-CM

## 2018-05-26 DIAGNOSIS — M79674 Pain in right toe(s): Secondary | ICD-10-CM | POA: Diagnosis not present

## 2018-05-26 DIAGNOSIS — E119 Type 2 diabetes mellitus without complications: Secondary | ICD-10-CM | POA: Diagnosis not present

## 2018-05-26 NOTE — Progress Notes (Signed)
Subjective: Teresa Barnes is a 72 y.o. female patient with history of diabetes who presents to office today complaining of long,mildly painful nails  while ambulating in shoes; unable to trim. Patient states that she sometimes get pain at left hallux lateral corner and wants the nail trimmed. Reports the glucose reading this morning was 145. Last A1c 6.1. Patient denies any new changes in medication or new problems. Patient denies any new cramping, numbness, burning or tingling in the legs. No other issues noted.  Patient Active Problem List   Diagnosis Date Noted  . History of right breast cancer 12/09/2017  . Mixed hyperlipidemia 12/09/2017  . Hypertension associated with diabetes ([place]) 12/09/2017  . Hiatal hernia 12/09/2017  . Esophageal stricture 12/09/2017  . Chronic gout 12/09/2017  . Glaucoma 12/09/2017  . Greater trochanteric bursitis of right hip 11/17/2017  . Degenerative arthritis of right knee 07/03/2017  . Normochromic anemia   . H/O: upper GI bleed 07/30/2016  . Right lumbar radiculopathy 01/23/2016  . Arthritis of right acromioclavicular joint 01/02/2016  . Controlled type 2 diabetes mellitus without complication, without long-term current use of insulin ([place]) 04/03/2009   Current Outpatient Medications on File Prior to Visit  Medication Sig Dispense Refill  . allopurinol (ZYLOPRIM) 100 MG tablet Take 1 tablet (100 mg total) by mouth daily. 90 tablet 3  . bimatoprost (LUMIGAN) 0.01 % SOLN Place 1 drop into both eyes nightly. 1 drop both eyes    . COMBIGAN 0.2-0.5 % ophthalmic solution   3  . folic acid (FOLVITE) 1 MG tablet Take 1 mg by mouth daily.    Marland Kitchen gabapentin (NEURONTIN) 100 MG capsule TAKE 2 CAPSULES BY MOUTH AT BEDTIME 180 capsule 1  . lisinopril (PRINIVIL,ZESTRIL) 5 MG tablet Take 1 tablet (5 mg total) by mouth daily. 90 tablet 3  . lovastatin (MEVACOR) 40 MG tablet Take 1 tablet (40 mg total) by mouth at bedtime. 90 tablet 0  . metFORMIN (GLUCOPHAGE) 1000 MG  tablet Take 1 tablet (1,000 mg total) by mouth 2 (two) times daily. 90 tablet 3  . NOVOFINE PLUS 32G X 4 MM MISC Check sugars once per day. 100 each 2  . omeprazole (PRILOSEC) 40 MG capsule TAKE ONE CAPSULE BY MOUTH ONCE DAILY. 30 capsule 6  . SUPER B COMPLEX/C PO Take by mouth.    Marland Kitchen VICTOZA 18 MG/3ML SOPN Inject 0.3 mLs (1.8 mg total) into the skin daily. 9 pen 3   No current facility-administered medications on file prior to visit.    Allergies  Allergen Reactions  . Prednisone Nausea And Vomiting  . Tramadol Nausea Only    Recent Results (from the past 2160 hour(s))  Comprehensive metabolic panel     Status: Abnormal   Collection Time: 03/09/18 11:15 AM  Result Value Ref Range   Sodium 138 135 - 145 mEq/L   Potassium 4.6 3.5 - 5.1 mEq/L   Chloride 100 96 - 112 mEq/L   CO2 22 19 - 32 mEq/L   Glucose, Bld 146 (H) 70 - 99 mg/dL   BUN 29 (H) 6 - 23 mg/dL   Creatinine, Ser 1.20 0.40 - 1.20 mg/dL   Total Bilirubin 0.6 0.2 - 1.2 mg/dL   Alkaline Phosphatase 54 39 - 117 U/L   AST 16 0 - 37 U/L   ALT 12 0 - 35 U/L   Total Protein 6.7 6.0 - 8.3 g/dL   Albumin 4.2 3.5 - 5.2 g/dL   Calcium 9.7 8.4 - 10.5 mg/dL  GFR 56.55 (L) >60.00 mL/min  Lipid panel     Status: None   Collection Time: 03/09/18 11:15 AM  Result Value Ref Range   Cholesterol 175 0 - 200 mg/dL    Comment: ATP III Classification       Desirable:  < 200 mg/dL               Borderline High:  200 - 239 mg/dL          High:  > = 240 mg/dL   Triglycerides 82.0 0.0 - 149.0 mg/dL    Comment: Normal:  <150 mg/dLBorderline High:  150 - 199 mg/dL   HDL 64.30 >39.00 mg/dL   VLDL 16.4 0.0 - 40.0 mg/dL   LDL Cholesterol 95 0 - 99 mg/dL   Total CHOL/HDL Ratio 3     Comment:                Men          Women1/2 Average Risk     3.4          3.3Average Risk          5.0          4.42X Average Risk          9.6          7.13X Average Risk          15.0          11.0                       NonHDL 110.92     Comment: NOTE:  Non-HDL  goal should be 30 mg/dL higher than patient's LDL goal (i.e. LDL goal of < 70 mg/dL, would have non-HDL goal of < 100 mg/dL)  Vitamin B12     Status: None   Collection Time: 03/09/18 11:15 AM  Result Value Ref Range   Vitamin B-12 618 211 - 911 pg/mL  Folate RBC     Status: None   Collection Time: 03/09/18 11:15 AM  Result Value Ref Range   RBC Folate 996 >280 ng/mL RBC  Microalbumin / creatinine urine ratio     Status: Abnormal   Collection Time: 03/09/18 11:15 AM  Result Value Ref Range   Microalb, Ur 8.1 (H) 0.0 - 1.9 mg/dL   Creatinine,U 48.9 mg/dL   Microalb Creat Ratio 16.6 0.0 - 30.0 mg/g  Uric acid     Status: None   Collection Time: 03/09/18 11:15 AM  Result Value Ref Range   Uric Acid, Serum 6.1 2.4 - 7.0 mg/dL  POCT glycosylated hemoglobin (Hb A1C)     Status: Normal   Collection Time: 03/09/18 11:40 AM  Result Value Ref Range   Hemoglobin A1C 6.1   Iron, TIBC and Ferritin Panel     Status: None   Collection Time: 03/09/18 12:11 PM  Result Value Ref Range   Iron 103 45 - 160 mcg/dL   TIBC 323 250 - 450 mcg/dL (calc)   %SAT 32 11 - 50 % (calc)   Ferritin 21 20 - 288 ng/mL  Ferritin     Status: None   Collection Time: 03/09/18  1:23 PM  Result Value Ref Range   Ferritin 19.5 10.0 - 291.0 ng/mL  IBC panel([facility])     Status: None   Collection Time: 03/09/18  1:23 PM  Result Value Ref Range   Iron 110 42 - 145 ug/dL   Transferrin 259.0  212.0 - 360.0 mg/dL   Saturation Ratios 30.3 20.0 - 50.0 %    Objective: General: Patient is awake, alert, and oriented x 3 and in no acute distress.  Integument: Skin is warm, dry and supple bilateral. Nails are tender, long, thickened and dystrophic with subungual debris, consistent with onychomycosis, 1-5 bilateral. No signs of infection. No open lesions or preulcerative lesions present bilateral. Remaining integument unremarkable.  Vasculature:  Dorsalis Pedis pulse 1/4 bilateral. Posterior Tibial pulse  1/4 bilateral.   Capillary fill time <3 sec 1-5 bilateral. Positive hair growth to the level of the digits. Temperature gradient within normal limits. No varicosities present bilateral. No edema present bilateral.   Neurology: The patient has intact sensation measured with a 5.07/10g Semmes Weinstein Monofilament at all pedal sites bilateral . Vibratory sensation diminished bilateral with tuning fork. No Babinski sign present bilateral.   Musculoskeletal: No symptomatic pedal deformities noted bilateral. Muscular strength 5/5 in all lower extremity muscular groups bilateral without pain on range of motion . No tenderness with calf compression bilateral.  Assessment and Plan: Problem List Items Addressed This Visit    None    Visit Diagnoses    Pain due to onychomycosis of toenails of both feet    -  Primary   Diabetes mellitus without complication ([place])          -Examined patient. -Discussed and educated patient on diabetic foot care, especially with  regards to the vascular, neurological and musculoskeletal systems.  -Stressed the importance of good glycemic control and the detriment of not  controlling glucose levels in relation to the foot. -Mechanically debrided all nails 1-5 bilateral using sterile nail nipper and filed with dremel without incident  -Answered all patient questions -Patient to return  in 3 months for at risk foot care -Patient advised to call the office if any problems or questions arise in the meantime.  Landis Martins, DPM

## 2018-06-08 ENCOUNTER — Ambulatory Visit: Payer: Medicare Other | Admitting: Family Medicine

## 2018-06-10 ENCOUNTER — Other Ambulatory Visit: Payer: Self-pay | Admitting: Family Medicine

## 2018-06-22 ENCOUNTER — Ambulatory Visit (INDEPENDENT_AMBULATORY_CARE_PROVIDER_SITE_OTHER): Payer: Medicare Other | Admitting: Family Medicine

## 2018-06-22 ENCOUNTER — Other Ambulatory Visit: Payer: Self-pay

## 2018-06-22 ENCOUNTER — Encounter: Payer: Self-pay | Admitting: Family Medicine

## 2018-06-22 VITALS — BP 118/72 | HR 78 | Temp 98.1°F | Ht 62.0 in | Wt 181.8 lb

## 2018-06-22 DIAGNOSIS — E782 Mixed hyperlipidemia: Secondary | ICD-10-CM | POA: Diagnosis not present

## 2018-06-22 DIAGNOSIS — E119 Type 2 diabetes mellitus without complications: Secondary | ICD-10-CM

## 2018-06-22 DIAGNOSIS — E1159 Type 2 diabetes mellitus with other circulatory complications: Secondary | ICD-10-CM | POA: Diagnosis not present

## 2018-06-22 DIAGNOSIS — I10 Essential (primary) hypertension: Secondary | ICD-10-CM | POA: Diagnosis not present

## 2018-06-22 DIAGNOSIS — D649 Anemia, unspecified: Secondary | ICD-10-CM | POA: Diagnosis not present

## 2018-06-22 LAB — POCT GLYCOSYLATED HEMOGLOBIN (HGB A1C): HEMOGLOBIN A1C: 6.3 % — AB (ref 4.0–5.6)

## 2018-06-22 LAB — CBC WITH DIFFERENTIAL/PLATELET
BASOS ABS: 0.1 10*3/uL (ref 0.0–0.1)
Basophils Relative: 1.1 % (ref 0.0–3.0)
EOS ABS: 0.2 10*3/uL (ref 0.0–0.7)
EOS PCT: 2.9 % (ref 0.0–5.0)
HCT: 31.1 % — ABNORMAL LOW (ref 36.0–46.0)
HEMOGLOBIN: 10.5 g/dL — AB (ref 12.0–15.0)
LYMPHS ABS: 2.3 10*3/uL (ref 0.7–4.0)
Lymphocytes Relative: 32.6 % (ref 12.0–46.0)
MCHC: 33.6 g/dL (ref 30.0–36.0)
MCV: 95.5 fl (ref 78.0–100.0)
MONO ABS: 0.4 10*3/uL (ref 0.1–1.0)
Monocytes Relative: 5.9 % (ref 3.0–12.0)
NEUTROS PCT: 57.5 % (ref 43.0–77.0)
Neutro Abs: 4.1 10*3/uL (ref 1.4–7.7)
Platelets: 362 10*3/uL (ref 150.0–400.0)
RBC: 3.26 Mil/uL — ABNORMAL LOW (ref 3.87–5.11)
RDW: 14.3 % (ref 11.5–15.5)
WBC: 7.1 10*3/uL (ref 4.0–10.5)

## 2018-06-22 MED ORDER — LOVASTATIN 40 MG PO TABS: 40.0000 mg | ORAL_TABLET | Freq: Every day | ORAL | 3 refills | Status: DC

## 2018-06-22 NOTE — Assessment & Plan Note (Signed)
Good control. On ace. Stable renal function by most recent labs. Nl potassium

## 2018-06-22 NOTE — Patient Instructions (Signed)
Please return in 3 months for diabetes follow up   If you have any questions or concerns, please don't hesitate to send me a message via MyChart or call the office at [phone]. Thank you for visiting with [place] today! It's our pleasure caring for you.  We will call you with your lab test results.  Your diabetes looks great!

## 2018-06-22 NOTE — Assessment & Plan Note (Signed)
Good control on current meds. No changes today. Nl foot exam. Eye exam up to date. bp at goal. Lipids at goal. Recheck 3 months.

## 2018-06-22 NOTE — Assessment & Plan Note (Signed)
On vit B complex but has been off iron for 3-4 months. Energy is good. No melena. Due for recheck of iron studies and cbc.

## 2018-06-22 NOTE — Progress Notes (Signed)
Subjective  CC:  Chief Complaint  Patient presents with  . Diabetes    last A1c, 03/09/2018, doing well     HPI: Teresa Barnes is a 72 y.o. female who presents to the office today for follow up of diabetes and problems listed above in the chief complaint. And f/u htn, hld and anemiz  Diabetes follow up: Her diabetic control is reported as Unchanged. Tolerating meds and feels fine. imms up to date. Due flu next month. Eye exam fine: sees retina specialist and reg Ophtho.  She denies exertional CP or SOB or symptomatic hypoglycemia. She denies foot sores or paresthesias.   HTN and HLD remain well controlled on meds. No AEs. No cp or sob. No palpitations or sxs of CHF  Anemia: due for recheck. Borderline low iron on last check in April.   Assessment  1. Controlled type 2 diabetes mellitus without complication, without long-term current use of insulin ([place])   2. Normochromic anemia   3. Hypertension associated with diabetes ([place])   4. Mixed hyperlipidemia      Plan   Diabetes is currently very well controlled. See below  Anemia: recheck lab work  HTN an HLD well controlled. No change in meds. See belwo.   Follow up: Return in about 3 months (around 09/22/2018) for follow up of diabetes and hypertension.. Orders Placed This Encounter  Procedures  . Iron, TIBC and Ferritin Panel  . CBC with Differential/Platelet  . POCT glycosylated hemoglobin (Hb A1C)   Meds ordered this encounter  Medications  . lovastatin (MEVACOR) 40 MG tablet    Sig: Take 1 tablet (40 mg total) by mouth at bedtime.    Dispense:  90 tablet    Refill:  3      Immunization History  Administered Date(s) Administered  . Influenza, High Dose Seasonal PF 08/18/2017  . Influenza,inj,Quad PF,6+ Mos 08/01/2016  . Influenza-Unspecified 08/18/2014  . Pneumococcal Conjugate-13 12/09/2017    Diabetes Related Lab Review: Lab Results  Component Value Date   HGBA1C 6.3 (A) 06/22/2018   HGBA1C 6.1  03/09/2018   HGBA1C 7.9 (H) 12/09/2017    Lab Results  Component Value Date   MICROALBUR 8.1 (H) 03/09/2018   Lab Results  Component Value Date   CREATININE 1.20 03/09/2018   BUN 29 (H) 03/09/2018   NA 138 03/09/2018   K 4.6 03/09/2018   CL 100 03/09/2018   CO2 22 03/09/2018   Lab Results  Component Value Date   CHOL 175 03/09/2018   CHOL 203 (H) 12/09/2017   Lab Results  Component Value Date   HDL 64.30 03/09/2018   HDL 57.60 12/09/2017   Lab Results  Component Value Date   LDLCALC 95 03/09/2018   LDLCALC 125 (H) 12/09/2017   Lab Results  Component Value Date   TRIG 82.0 03/09/2018   TRIG 103.0 12/09/2017   Lab Results  Component Value Date   CHOLHDL 3 03/09/2018   CHOLHDL 4 12/09/2017   No results found for: LDLDIRECT The 10-year ASCVD risk score Mikey Bussing DC Jr., et al., 2013) is: 25%   Values used to calculate the score:     Age: 80 years     Sex: Female     Is Non-Hispanic African American: Yes     Diabetic: Yes     Tobacco smoker: No     Systolic Blood Pressure: 382 mmHg     Is BP treated: Yes     HDL Cholesterol: 64.3 mg/dL  Total Cholesterol: 175 mg/dL I have reviewed the PMH, Fam and Soc history. Patient Active Problem List   Diagnosis Date Noted  . Mixed hyperlipidemia 12/09/2017    Priority: High  . Hypertension associated with diabetes ([place]) 12/09/2017    Priority: High  . H/O: upper GI bleed 07/30/2016    Priority: High    Due to severe erosive esophagitis; 2017; healed with PPI by egd f/u; has [place] and stricture; s/p blood transfusion. Negative for Barretts esophagitis   . Controlled type 2 diabetes mellitus without complication, without long-term current use of insulin ([place]) 04/03/2009    Priority: High    dxd 1990; well controlled    . History of right breast cancer 12/09/2017    Priority: Medium    S/p mastectomy 1990   . Degenerative arthritis of right knee 07/03/2017    Priority: Medium    Injected 07/03/2017   . Right  lumbar radiculopathy 01/23/2016    Priority: Medium  . Arthritis of right acromioclavicular joint 01/02/2016    Priority: Medium    Injected 01/02/2016 Repeat injection 03/12/2016   . Greater trochanteric bursitis of right hip 11/17/2017    Priority: Low    Injected November 17, 2017 Repeat injection May 05, 2018   . Normochromic anemia     Priority: Low  . Hiatal hernia 12/09/2017  . Esophageal stricture 12/09/2017    S/p dilation by GI: Dr. Scarlette Shorts; due to h/o severe recurrent erosive esophagitis.   . Chronic gout 12/09/2017    On allopurinol   . Glaucoma 12/09/2017    Social History: Patient  reports that she quit smoking about 25 years ago. Her smoking use included cigarettes. She has a 2.50 pack-year smoking history. She has never used smokeless tobacco. She reports that she does not drink alcohol or use drugs.  Review of Systems: Ophthalmic: negative for eye pain, loss of vision or double vision Cardiovascular: negative for chest pain Respiratory: negative for SOB or persistent cough Gastrointestinal: negative for abdominal pain Genitourinary: negative for dysuria or gross hematuria MSK: negative for foot lesions Neurologic: negative for weakness or gait disturbance Wt Readings from Last 3 Encounters:  06/22/18 181 lb 12.8 oz (82.5 kg)  05/05/18 183 lb (83 kg)  03/09/18 183 lb 9.6 oz (83.3 kg)    Objective  Vitals: BP 118/72   Pulse 78   Temp 98.1 F (36.7 C)   Ht 5\' 2"  (1.575 m)   Wt 181 lb 12.8 oz (82.5 kg)   SpO2 98%   BMI 33.25 kg/m  General: well appearing, no acute distress  Psych:  Alert and oriented, normal mood and affect HEENT:  Normocephalic, atraumatic, moist mucous membranes, supple neck  Cardiovascular:  Nl S1 and S2, RRR without murmur, gallop or rub. no edema Respiratory:  Good breath sounds bilaterally, CTAB with normal effort, no rales Gastrointestinal: normal BS, soft, nontender Skin:  Warm, no rashes Neurologic:   Mental status  is normal. normal gait Foot exam: no erythema, pallor, or cyanosis visible nl proprioception and sensation to monofilament testing bilaterally, +2 distal pulses bilaterally    Diabetic education: ongoing education regarding chronic disease management for diabetes was given today. We continue to reinforce the ABC's of diabetic management: A1c (<7 or 8 dependent upon patient), tight blood pressure control, and cholesterol management with goal LDL < 100 minimally. We discuss diet strategies, exercise recommendations, medication options and possible side effects. At each visit, we review recommended immunizations and preventive care recommendations for diabetics and  stress that good diabetic control can prevent other problems. See below for this patient's data.    Commons side effects, risks, benefits, and alternatives for medications and treatment plan prescribed today were discussed, and the patient expressed understanding of the given instructions. Patient is instructed to call or message via MyChart if he/she has any questions or concerns regarding our treatment plan. No barriers to understanding were identified. We discussed Red Flag symptoms and signs in detail. Patient expressed understanding regarding what to do in case of urgent or emergency type symptoms.   Medication list was reconciled, printed and provided to the patient in AVS. Patient instructions and summary information was reviewed with the patient as documented in the AVS. This note was prepared with assistance of Dragon voice recognition software. Occasional wrong-word or sound-a-like substitutions may have occurred due to the inherent limitations of voice recognition software

## 2018-06-23 LAB — IRON,TIBC AND FERRITIN PANEL
%SAT: 23 % (ref 16–45)
FERRITIN: 20 ng/mL (ref 16–288)
Iron: 69 ug/dL (ref 45–160)
TIBC: 301 mcg/dL (calc) (ref 250–450)

## 2018-06-23 NOTE — Progress Notes (Signed)
Please call patient: I have reviewed his/her lab results. Anemia is mildly worsening; iron studies are ok but a little lower. Please continue with vit B12 complex and add back oral iron (otc) once daily. This should keep her levels stable. Thanks.

## 2018-07-02 ENCOUNTER — Encounter (INDEPENDENT_AMBULATORY_CARE_PROVIDER_SITE_OTHER): Payer: Medicare Other | Admitting: Ophthalmology

## 2018-07-02 DIAGNOSIS — H35033 Hypertensive retinopathy, bilateral: Secondary | ICD-10-CM | POA: Diagnosis not present

## 2018-07-02 DIAGNOSIS — I10 Essential (primary) hypertension: Secondary | ICD-10-CM | POA: Diagnosis not present

## 2018-07-02 DIAGNOSIS — H338 Other retinal detachments: Secondary | ICD-10-CM

## 2018-07-02 DIAGNOSIS — E113593 Type 2 diabetes mellitus with proliferative diabetic retinopathy without macular edema, bilateral: Secondary | ICD-10-CM

## 2018-07-02 DIAGNOSIS — H43811 Vitreous degeneration, right eye: Secondary | ICD-10-CM

## 2018-07-02 DIAGNOSIS — E11319 Type 2 diabetes mellitus with unspecified diabetic retinopathy without macular edema: Secondary | ICD-10-CM | POA: Diagnosis not present

## 2018-07-10 ENCOUNTER — Other Ambulatory Visit: Payer: Self-pay | Admitting: Internal Medicine

## 2018-07-10 DIAGNOSIS — K2100 Gastro-esophageal reflux disease with esophagitis, without bleeding: Secondary | ICD-10-CM

## 2018-07-14 NOTE — Progress Notes (Signed)
Subjective:   Teresa Barnes is a 72 y.o. female who presents for Medicare Annual (Subsequent) preventive examination.  Review of Systems:  No ROS.  Medicare Wellness Visit. Additional risk factors are reflected in the social history.  Cardiac Risk Factors include: advanced age (>35men, >23 women);obesity (BMI >30kg/m2);diabetes mellitus;hypertension;dyslipidemia;sedentary lifestyle;family history of premature cardiovascular disease   Sleep patterns: Sleeps 8 hours.  Home Safety/Smoke Alarms: Feels safe in home. Smoke alarms in place.  Living environment; residence and Firearm Safety: Lives with husband in 2 story home, bedroom downstairs.  Seat Belt Safety/Bike Helmet: Wears seat belt.   Female:   Pap-N/A      Mammo-02/16/2018, BI-RADS CATEGORY  1: Negative.         Dexa scan-02/16/2018, normal.        CCS-Colonoscopy 06/15/2014, normal. Recall 10 years.      Objective:     Vitals: BP (!) 122/58 (BP Location: Left Arm, Patient Position: Sitting, Cuff Size: Normal)   Pulse 63   Ht 5\' 2"  (1.575 m)   Wt 184 lb 2 oz (83.5 kg)   SpO2 98%   BMI 33.68 kg/m   Body mass index is 33.68 kg/m.  Advanced Directives 07/15/2018 02/11/2017 01/30/2017 12/10/2016 11/27/2016 07/30/2016 06/15/2014  Does Patient Have a Medical Advance Directive? No No No No Yes No Patient does not have advance directive  Type of Advance Directive - - - - Living will - -  Does patient want to make changes to medical advance directive? - - - - No - Patient declined - -  Would patient like information on creating a medical advance directive? No - Patient declined - - - - No - patient declined information -    Tobacco Social History   Tobacco Use  Smoking Status Former Smoker  . Packs/day: 0.25  . Years: 10.00  . Pack years: 2.50  . Types: Cigarettes  . Last attempt to quit: 06/18/1993  . Years since quitting: 25.0  Smokeless Tobacco Never Used     Counseling given: Not Answered    Past Medical History:    Diagnosis Date  . Anemia   . Blood transfusion without reported diagnosis 07/2016  . Cancer ([place]) 1991   breast  . Chronic gout 12/09/2017   On allopurinol  . Diabetes mellitus without complication ([place])   . Esophageal stricture 12/09/2017   S/p dilation by GI: Dr. Scarlette Shorts; due to h/o severe recurrent erosive esophagitis.  . 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 GERD (gastroesophageal reflux disease)   . Glaucoma   . Hiatal hernia 12/09/2017  . Hyperlipidemia   . Hypertension   . Right rotator cuff tear 11/24/2015   Injected this and 26 2017  . Wears glasses    Past Surgical History:  Procedure Laterality Date  . CATARACT EXTRACTION W/ INTRAOCULAR LENS  IMPLANT, BILATERAL  2000  . COLONOSCOPY    . cyst on spine    . ESOPHAGOGASTRODUODENOSCOPY (EGD) WITH PROPOFOL N/A 07/31/2016   Procedure: ESOPHAGOGASTRODUODENOSCOPY (EGD) WITH PROPOFOL;  Surgeon: Manus Gunning, MD;  Location: [facility];  Service: Gastroenterology;  Laterality: N/A;  . EYE SURGERY  2011   lt eye blled  . MASTECTOMY MODIFIED RADICAL Right 1990  . REDUCTION MAMMAPLASTY Left   . TRIGGER FINGER RELEASE Right 06/24/2013   Procedure: RELEASE TRIGGER FINGER/A-1 PULLEY PIP RIGHT LONG FINGER;  Surgeon: Cammie Sickle., MD;  Location: [facility];  Service: Orthopedics;  Laterality: Right;  . TRIGGER FINGER RELEASE    . TUBAL LIGATION     Family History  Problem Relation Age of Onset  . Diabetes Mother   . Diabetes Daughter   . Arthritis Daughter   . Hyperlipidemia Daughter   . Diabetes Son   . Arthritis Son   . Diabetes Sister   . Colon cancer Neg Hx   . Cancer Neg Hx   . Heart disease Neg Hx   . Breast cancer Neg Hx    Social History   Socioeconomic History  . Marital status: Married    Spouse name: robert  . Number of children: 2  . Years of education: Not on file  . Highest education level: Not on file  Occupational History  .  Occupation: retired Educational psychologist; [employer]    Comment: 2006  Social Needs  . Financial resource strain: Not on file  . Food insecurity:    Worry: Not on file    Inability: Not on file  . Transportation needs:    Medical: Not on file    Non-medical: Not on file  Tobacco Use  . Smoking status: Former Smoker    Packs/day: 0.25    Years: 10.00    Pack years: 2.50    Types: Cigarettes    Last attempt to quit: 06/18/1993    Years since quitting: 25.0  . Smokeless tobacco: Never Used  Substance and Sexual Activity  . Alcohol use: No  . Drug use: No  . Sexual activity: Never  Lifestyle  . Physical activity:    Days per week: Not on file    Minutes per session: Not on file  . Stress: Not on file  Relationships  . Social connections:    Talks on phone: Not on file    Gets together: Not on file    Attends religious service: Not on file    Active member of club or organization: Not on file    Attends meetings of clubs or organizations: Not on file    Relationship status: Not on file  Other Topics Concern  . Not on file  Social History Narrative   Originally from [place], [place]; worked in [employer] for [place]; longevity in family    Outpatient Encounter Medications as of 07/15/2018  Medication Sig  . allopurinol (ZYLOPRIM) 100 MG tablet Take 1 tablet (100 mg total) by mouth daily.  . bimatoprost (LUMIGAN) 0.01 % SOLN Place 1 drop into both eyes nightly. 1 drop both eyes  . COMBIGAN 0.2-0.5 % ophthalmic solution   . folic acid (FOLVITE) 1 MG tablet Take 1 mg by mouth daily.  Marland Kitchen gabapentin (NEURONTIN) 100 MG capsule TAKE 2 CAPSULES BY MOUTH AT BEDTIME  . lisinopril (PRINIVIL,ZESTRIL) 5 MG tablet Take 1 tablet (5 mg total) by mouth daily.  Marland Kitchen lovastatin (MEVACOR) 40 MG tablet Take 1 tablet (40 mg total) by mouth at bedtime.  . metFORMIN (GLUCOPHAGE) 1000 MG tablet Take 1 tablet (1,000 mg total) by mouth 2 (two) times daily.  Marland Kitchen NOVOFINE PLUS 32G X 4 MM MISC Check sugars once per day.  Marland Kitchen  omeprazole (PRILOSEC) 40 MG capsule TAKE 1 CAPSULE BY MOUTH ONCE DAILY  . SUPER B COMPLEX/C PO Take by mouth.  Marland Kitchen VICTOZA 18 MG/3ML SOPN Inject 0.3 mLs (1.8 mg total) into the skin daily.   No facility-administered encounter medications on file as of 07/15/2018.     Activities of Daily Living In your present state of health, do you have any  difficulty performing the following activities: 07/15/2018  Hearing? N  Vision? N  Difficulty concentrating or making decisions? N  Walking or climbing stairs? N  Dressing or bathing? N  Doing errands, shopping? N  Preparing Food and eating ? N  Using the Toilet? N  In the past six months, have you accidently leaked urine? N  Do you have problems with loss of bowel control? N  Managing your Medications? N  Managing your Finances? N  Housekeeping or managing your Housekeeping? N  Some recent data might be hidden    Patient Care Team: Leamon Arnt, MD as PCP - General (Family Medicine) Lyndal Pulley, DO as Consulting Physician (Family Medicine) Irene Shipper, MD as Consulting Physician (Gastroenterology) Hayden Pedro, MD as Consulting Physician (Ophthalmology)    Assessment:   This is a routine wellness examination for Preston.  Exercise Activities and Dietary recommendations Current Exercise Habits: The patient does not participate in regular exercise at present(Typically attends [employer]- walking and bike. ), Exercise limited by: orthopedic condition(s)   Diet (meal preparation, eat out, water intake, caffeinated beverages, dairy products, fruits and vegetables): Drinks water and occasional soda.   Brunch: Boiled egg; salads Dinner:  Protein and vegetables.     Goals    . Weight (lb) < 165 lb (74.8 kg)     Lose weight by watching calorie intake.        Fall Risk Fall Risk  07/15/2018 12/09/2017  Falls in the past year? No No    Depression Screen PHQ 2/9 Scores 07/15/2018 03/09/2018 12/09/2017  PHQ - 2 Score 0 0 0  PHQ- 9 Score  - 0 -     Cognitive Function MMSE - Mini Mental State Exam 07/15/2018  Orientation to time 5  Orientation to Place 5  Registration 3  Attention/ Calculation 5  Recall 0  Language- name 2 objects 2  Language- repeat 1  Language- follow 3 step command 3  Language- read & follow direction 1  Write a sentence 1  Copy design 1  Total score 27        Immunization History  Administered Date(s) Administered  . Influenza, High Dose Seasonal PF 08/18/2017, 07/15/2018  . Influenza,inj,Quad PF,6+ Mos 08/01/2016  . Influenza-Unspecified 08/18/2014  . Pneumococcal Conjugate-13 12/09/2017    Screening Tests Health Maintenance  Topic Date Due  . INFLUENZA VACCINE  06/18/2018  . PNA vac Low Risk Adult (2 of 2 - PPSV23) 12/09/2018  . HEMOGLOBIN A1C  12/23/2018  . MAMMOGRAM  02/17/2019  . OPHTHALMOLOGY EXAM  05/19/2019  . FOOT EXAM  06/23/2019  . DEXA SCAN  02/17/2023  . COLONOSCOPY  06/15/2024  . Hepatitis C Screening  Completed       Plan:    Continue doing brain stimulating activities (puzzles, reading, adult coloring books, staying active) to keep memory sharp.   I have personally reviewed and noted the following in the patient's chart:   . Medical and social history . Use of alcohol, tobacco or illicit drugs  . Current medications and supplements . Functional ability and status . Nutritional status . Physical activity . Advanced directives . List of other physicians . Hospitalizations, surgeries, and ER visits in previous 12 months . Vitals . Screenings to include cognitive, depression, and falls . Referrals and appointments  In addition, I have reviewed and discussed with patient certain preventive protocols, quality metrics, and best practice recommendations. A written personalized care plan for preventive services as well as general preventive  health recommendations were provided to patient.     Gerilyn Nestle, RN  07/15/2018  PCP Notes: -Pt requesting  prescription (hard copy) for mastectomy bra, telephone note sent.

## 2018-07-15 ENCOUNTER — Ambulatory Visit (INDEPENDENT_AMBULATORY_CARE_PROVIDER_SITE_OTHER): Payer: Medicare Other

## 2018-07-15 ENCOUNTER — Other Ambulatory Visit: Payer: Self-pay

## 2018-07-15 ENCOUNTER — Telehealth: Payer: Self-pay

## 2018-07-15 VITALS — BP 122/58 | HR 63 | Ht 62.0 in | Wt 184.1 lb

## 2018-07-15 DIAGNOSIS — E669 Obesity, unspecified: Secondary | ICD-10-CM | POA: Diagnosis not present

## 2018-07-15 DIAGNOSIS — Z0000 Encounter for general adult medical examination without abnormal findings: Secondary | ICD-10-CM | POA: Diagnosis not present

## 2018-07-15 DIAGNOSIS — Z23 Encounter for immunization: Secondary | ICD-10-CM

## 2018-07-15 NOTE — Telephone Encounter (Signed)
Pt in for AWV today, requesting prescription (hard copy) for Right Mastectomy bra.

## 2018-07-15 NOTE — Progress Notes (Signed)
I have reviewed the documentation from the recent AWV done by Kim Broome; I agree with the documentation and will follow up on any recommendations or abnormal findings as suggested.  

## 2018-07-15 NOTE — Patient Instructions (Addendum)
Continue doing brain stimulating activities (puzzles, reading, adult coloring books, staying active) to keep memory sharp.   Health Maintenance, Female Adopting a healthy lifestyle and getting preventive care can go a long way to promote health and wellness. Talk with your health care provider about what schedule of regular examinations is right for you. This is a good chance for you to check in with your provider about disease prevention and staying healthy. In between checkups, there are plenty of things you can do on your own. Experts have done a lot of research about which lifestyle changes and preventive measures are most likely to keep you healthy. Ask your health care provider for more information. Weight and diet Eat a healthy diet  Be sure to include plenty of vegetables, fruits, low-fat dairy products, and lean protein.  Do not eat a lot of foods high in solid fats, added sugars, or salt.  Get regular exercise. This is one of the most important things you can do for your health. ? Most adults should exercise for at least 150 minutes each week. The exercise should increase your heart rate and make you sweat (moderate-intensity exercise). ? Most adults should also do strengthening exercises at least twice a week. This is in addition to the moderate-intensity exercise.  Maintain a healthy weight  Body mass index (BMI) is a measurement that can be used to identify possible weight problems. It estimates body fat based on height and weight. Your health care provider can help determine your BMI and help you achieve or maintain a healthy weight.  For females 67 years of age and older: ? A BMI below 18.5 is considered underweight. ? A BMI of 18.5 to 24.9 is normal. ? A BMI of 25 to 29.9 is considered overweight. ? A BMI of 30 and above is considered obese.  Watch levels of cholesterol and blood lipids  You should start having your blood tested for lipids and cholesterol at 74 years of  age, then have this test every 5 years.  You may need to have your cholesterol levels checked more often if: ? Your lipid or cholesterol levels are high. ? You are older than 74 years of age. ? You are at high risk for heart disease.  Cancer screening Lung Cancer  Lung cancer screening is recommended for adults 6-47 years old who are at high risk for lung cancer because of a history of smoking.  A yearly low-dose CT scan of the lungs is recommended for people who: ? Currently smoke. ? Have quit within the past 15 years. ? Have at least a 30-pack-year history of smoking. A pack year is smoking an average of one pack of cigarettes a day for 1 year.  Yearly screening should continue until it has been 15 years since you quit.  Yearly screening should stop if you develop a health problem that would prevent you from having lung cancer treatment.  Breast Cancer  Practice breast self-awareness. This means understanding how your breasts normally appear and feel.  It also means doing regular breast self-exams. Let your health care provider know about any changes, no matter how small.  If you are in your 20s or 30s, you should have a clinical breast exam (CBE) by a health care provider every 1-3 years as part of a regular health exam.  If you are 21 or older, have a CBE every year. Also consider having a breast X-ray (mammogram) every year.  If you have a family history of  breast cancer, talk to your health care provider about genetic screening.  If you are at high risk for breast cancer, talk to your health care provider about having an MRI and a mammogram every year.  Breast cancer gene (BRCA) assessment is recommended for women who have family members with BRCA-related cancers. BRCA-related cancers include: ? Breast. ? Ovarian. ? Tubal. ? Peritoneal cancers.  Results of the assessment will determine the need for genetic counseling and BRCA1 and BRCA2 testing.  Cervical  Cancer Your health care provider may recommend that you be screened regularly for cancer of the pelvic organs (ovaries, uterus, and vagina). This screening involves a pelvic examination, including checking for microscopic changes to the surface of your cervix (Pap test). You may be encouraged to have this screening done every 3 years, beginning at age 12.  For women ages 62-65, health care providers may recommend pelvic exams and Pap testing every 3 years, or they may recommend the Pap and pelvic exam, combined with testing for human papilloma virus (HPV), every 5 years. Some types of HPV increase your risk of cervical cancer. Testing for HPV may also be done on women of any age with unclear Pap test results.  Other health care providers may not recommend any screening for nonpregnant women who are considered low risk for pelvic cancer and who do not have symptoms. Ask your health care provider if a screening pelvic exam is right for you.  If you have had past treatment for cervical cancer or a condition that could lead to cancer, you need Pap tests and screening for cancer for at least 20 years after your treatment. If Pap tests have been discontinued, your risk factors (such as having a new sexual partner) need to be reassessed to determine if screening should resume. Some women have medical problems that increase the chance of getting cervical cancer. In these cases, your health care provider may recommend more frequent screening and Pap tests.  Colorectal Cancer  This type of cancer can be detected and often prevented.  Routine colorectal cancer screening usually begins at 74 years of age and continues through 74 years of age.  Your health care provider may recommend screening at an earlier age if you have risk factors for colon cancer.  Your health care provider may also recommend using home test kits to check for hidden blood in the stool.  A small camera at the end of a tube can be used to  examine your colon directly (sigmoidoscopy or colonoscopy). This is done to check for the earliest forms of colorectal cancer.  Routine screening usually begins at age 46.  Direct examination of the colon should be repeated every 5-10 years through 74 years of age. However, you may need to be screened more often if early forms of precancerous polyps or small growths are found.  Skin Cancer  Check your skin from head to toe regularly.  Tell your health care provider about any new moles or changes in moles, especially if there is a change in a mole's shape or color.  Also tell your health care provider if you have a mole that is larger than the size of a pencil eraser.  Always use sunscreen. Apply sunscreen liberally and repeatedly throughout the day.  Protect yourself by wearing long sleeves, pants, a wide-brimmed hat, and sunglasses whenever you are outside.  Heart disease, diabetes, and high blood pressure  High blood pressure causes heart disease and increases the risk of stroke. High blood  blood pressure is more likely to develop in: ? People who have blood pressure in the high end of the normal range (130-139/85-89 mm Hg). ? People who are overweight or obese. ? People who are African American.  If you are 18-39 years of age, have your blood pressure checked every 3-5 years. If you are 40 years of age or older, have your blood pressure checked every year. You should have your blood pressure measured twice-once when you are at a hospital or clinic, and once when you are not at a hospital or clinic. Record the average of the two measurements. To check your blood pressure when you are not at a hospital or clinic, you can use: ? An automated blood pressure machine at a pharmacy. ? A home blood pressure monitor.  If you are between 55 years and 79 years old, ask your health care provider if you should take aspirin to prevent strokes.  Have regular diabetes screenings. This involves taking a  blood sample to check your fasting blood sugar level. ? If you are at a normal weight and have a low risk for diabetes, have this test once every three years after 74 years of age. ? If you are overweight and have a high risk for diabetes, consider being tested at a younger age or more often. Preventing infection Hepatitis B  If you have a higher risk for hepatitis B, you should be screened for this virus. You are considered at high risk for hepatitis B if: ? You were born in a country where hepatitis B is common. Ask your health care provider which countries are considered high risk. ? Your parents were born in a high-risk country, and you have not been immunized against hepatitis B (hepatitis B vaccine). ? You have HIV or AIDS. ? You use needles to inject street drugs. ? You live with someone who has hepatitis B. ? You have had sex with someone who has hepatitis B. ? You get hemodialysis treatment. ? You take certain medicines for conditions, including cancer, organ transplantation, and autoimmune conditions.  Hepatitis C  Blood testing is recommended for: ? Everyone born from 1945 through 1965. ? Anyone with known risk factors for hepatitis C.  Sexually transmitted infections (STIs)  You should be screened for sexually transmitted infections (STIs) including gonorrhea and chlamydia if: ? You are sexually active and are younger than 74 years of age. ? You are older than 74 years of age and your health care provider tells you that you are at risk for this type of infection. ? Your sexual activity has changed since you were last screened and you are at an increased risk for chlamydia or gonorrhea. Ask your health care provider if you are at risk.  If you do not have HIV, but are at risk, it may be recommended that you take a prescription medicine daily to prevent HIV infection. This is called pre-exposure prophylaxis (PrEP). You are considered at risk if: ? You are sexually active and  do not regularly use condoms or know the HIV status of your partner(s). ? You take drugs by injection. ? You are sexually active with a partner who has HIV.  Talk with your health care provider about whether you are at high risk of being infected with HIV. If you choose to begin PrEP, you should first be tested for HIV. You should then be tested every 3 months for as long as you are taking PrEP. Pregnancy  If you are   and you may become pregnant, ask your health care provider about preconception counseling.  If you may become pregnant, take 400 to 800 micrograms (mcg) of folic acid every day.  If you want to prevent pregnancy, talk to your health care provider about birth control (contraception). Osteoporosis and menopause  Osteoporosis is a disease in which the bones lose minerals and strength with aging. This can result in serious bone fractures. Your risk for osteoporosis can be identified using a bone density scan.  If you are 76 years of age or older, or if you are at risk for osteoporosis and fractures, ask your health care provider if you should be screened.  Ask your health care provider whether you should take a calcium or vitamin D supplement to lower your risk for osteoporosis.  Menopause may have certain physical symptoms and risks.  Hormone replacement therapy may reduce some of these symptoms and risks. Talk to your health care provider about whether hormone replacement therapy is right for you. Follow these instructions at home:  Schedule regular health, dental, and eye exams.  Stay current with your immunizations.  Do not use any tobacco products including cigarettes, chewing tobacco, or electronic cigarettes.  If you are pregnant, do not drink alcohol.  If you are breastfeeding, limit how much and how often you drink alcohol.  Limit alcohol intake to no more than 1 drink per day for nonpregnant women. One drink equals 12 ounces of beer, 5 ounces of  wine, or 1 ounces of hard liquor.  Do not use street drugs.  Do not share needles.  Ask your health care provider for help if you need support or information about quitting drugs.  Tell your health care provider if you often feel depressed.  Tell your health care provider if you have ever been abused or do not feel safe at home. This information is not intended to replace advice given to you by your health care provider. Make sure you discuss any questions you have with your health care provider. Document Released: 05/20/2011 Document Revised: 04/11/2016 Document Reviewed: 08/08/2015 Elsevier Interactive Patient Education  [employer].

## 2018-07-16 NOTE — Telephone Encounter (Signed)
RX written; please notify pt: ready for pick up or can fax to medical supply store of her choice.

## 2018-07-16 NOTE — Telephone Encounter (Signed)
Patient informed. She will stop and pick it up today.   Doloris Hall,  LPN

## 2018-07-16 NOTE — Telephone Encounter (Signed)
LM for Patient to [facility]  CRM Created.   Doloris Hall,  LPN

## 2018-07-28 ENCOUNTER — Ambulatory Visit: Payer: Medicare Other | Admitting: Sports Medicine

## 2018-07-28 ENCOUNTER — Encounter: Payer: Self-pay | Admitting: Sports Medicine

## 2018-07-28 DIAGNOSIS — M79674 Pain in right toe(s): Secondary | ICD-10-CM

## 2018-07-28 DIAGNOSIS — M79675 Pain in left toe(s): Secondary | ICD-10-CM

## 2018-07-28 DIAGNOSIS — E119 Type 2 diabetes mellitus without complications: Secondary | ICD-10-CM

## 2018-07-28 DIAGNOSIS — B351 Tinea unguium: Secondary | ICD-10-CM | POA: Diagnosis not present

## 2018-07-28 NOTE — Progress Notes (Signed)
Subjective: Teresa Barnes is a 72 y.o. female patient with history of diabetes who returns to office today complaining of long,mildly painful nails  while ambulating in shoes; unable to trim. Patient states that she had wanted to come in sooner because of her left great toe now seems like it was growing into the skin. Reports the glucose reading this morning was 132. Last A1c 6.3. Patient denies any new changes in medication or new problems since last visit no changes with medical history.  Patient Active Problem List   Diagnosis Date Noted  . History of right breast cancer 12/09/2017  . Mixed hyperlipidemia 12/09/2017  . Hypertension associated with diabetes ([place]) 12/09/2017  . Hiatal hernia 12/09/2017  . Esophageal stricture 12/09/2017  . Chronic gout 12/09/2017  . Glaucoma 12/09/2017  . Greater trochanteric bursitis of right hip 11/17/2017  . Degenerative arthritis of right knee 07/03/2017  . Normochromic anemia   . H/O: upper GI bleed 07/30/2016  . Right lumbar radiculopathy 01/23/2016  . Arthritis of right acromioclavicular joint 01/02/2016  . Controlled type 2 diabetes mellitus without complication, without long-term current use of insulin ([place]) 04/03/2009   Current Outpatient Medications on File Prior to Visit  Medication Sig Dispense Refill  . allopurinol (ZYLOPRIM) 100 MG tablet Take 1 tablet (100 mg total) by mouth daily. 90 tablet 3  . bimatoprost (LUMIGAN) 0.01 % SOLN Place 1 drop into both eyes nightly. 1 drop both eyes    . COMBIGAN 0.2-0.5 % ophthalmic solution   3  . folic acid (FOLVITE) 1 MG tablet Take 1 mg by mouth daily.    Marland Kitchen gabapentin (NEURONTIN) 100 MG capsule TAKE 2 CAPSULES BY MOUTH AT BEDTIME 180 capsule 1  . lisinopril (PRINIVIL,ZESTRIL) 5 MG tablet Take 1 tablet (5 mg total) by mouth daily. 90 tablet 3  . lovastatin (MEVACOR) 40 MG tablet Take 1 tablet (40 mg total) by mouth at bedtime. 90 tablet 3  . metFORMIN (GLUCOPHAGE) 1000 MG tablet Take 1 tablet (1,000  mg total) by mouth 2 (two) times daily. 90 tablet 3  . NOVOFINE PLUS 32G X 4 MM MISC Check sugars once per day. 100 each 2  . omeprazole (PRILOSEC) 40 MG capsule TAKE 1 CAPSULE BY MOUTH ONCE DAILY 30 capsule 2  . SUPER B COMPLEX/C PO Take by mouth.    Marland Kitchen VICTOZA 18 MG/3ML SOPN Inject 0.3 mLs (1.8 mg total) into the skin daily. 9 pen 3   No current facility-administered medications on file prior to visit.    Allergies  Allergen Reactions  . Prednisone Nausea And Vomiting  . Tramadol Nausea Only    Recent Results (from the past 2160 hour(s))  POCT glycosylated hemoglobin (Hb A1C)     Status: Abnormal   Collection Time: 06/22/18 11:46 AM  Result Value Ref Range   Hemoglobin A1C 6.3 (A) 4.0 - 5.6 %   HbA1c POC (<> result, manual entry)  4.0 - 5.6 %   HbA1c, POC (prediabetic range)  5.7 - 6.4 %   HbA1c, POC (controlled diabetic range)  0.0 - 7.0 %  Iron, TIBC and Ferritin Panel     Status: None   Collection Time: 06/22/18 12:11 PM  Result Value Ref Range   Iron 69 45 - 160 mcg/dL   TIBC 301 250 - 450 mcg/dL (calc)   %SAT 23 16 - 45 % (calc)   Ferritin 20 16 - 288 ng/mL  CBC with Differential/Platelet     Status: Abnormal   Collection Time:  06/22/18 12:11 PM  Result Value Ref Range   WBC 7.1 4.0 - 10.5 K/uL   RBC 3.26 (L) 3.87 - 5.11 Mil/uL   Hemoglobin 10.5 (L) 12.0 - 15.0 g/dL   HCT 31.1 (L) 36.0 - 46.0 %   MCV 95.5 78.0 - 100.0 fl   MCHC 33.6 30.0 - 36.0 g/dL   RDW 14.3 11.5 - 15.5 %   Platelets 362.0 150.0 - 400.0 K/uL   Neutrophils Relative % 57.5 43.0 - 77.0 %   Lymphocytes Relative 32.6 12.0 - 46.0 %   Monocytes Relative 5.9 3.0 - 12.0 %   Eosinophils Relative 2.9 0.0 - 5.0 %   Basophils Relative 1.1 0.0 - 3.0 %   Neutro Abs 4.1 1.4 - 7.7 K/uL   Lymphs Abs 2.3 0.7 - 4.0 K/uL   Monocytes Absolute 0.4 0.1 - 1.0 K/uL   Eosinophils Absolute 0.2 0.0 - 0.7 K/uL   Basophils Absolute 0.1 0.0 - 0.1 K/uL    Objective: General: Patient is awake, alert, and oriented x 3 and  in no acute distress.  Integument: Skin is warm, dry and supple bilateral. Nails are tender, long, thickened and dystrophic with subungual debris, consistent with onychomycosis, 1-5 bilateral with mild incurvation left great toe without signs of infection. No open lesions or preulcerative lesions present bilateral. Remaining integument unremarkable.  Vasculature:  Dorsalis Pedis pulse 1/4 bilateral. Posterior Tibial pulse  1/4 bilateral.  Capillary fill time <3 sec 1-5 bilateral. Positive hair growth to the level of the digits. Temperature gradient within normal limits. No varicosities present bilateral. No edema present bilateral.   Neurology: The patient has intact sensation measured with a 5.07/10g Semmes Weinstein Monofilament at all pedal sites bilateral . Vibratory sensation diminished bilateral with tuning fork. No Babinski sign present bilateral.   Musculoskeletal: No symptomatic pedal deformities noted bilateral. Muscular strength 5/5 in all lower extremity muscular groups bilateral without pain on range of motion . No tenderness with calf compression bilateral.  Assessment and Plan: Problem List Items Addressed This Visit    None    Visit Diagnoses    Pain due to onychomycosis of toenails of both feet    -  Primary   Diabetes mellitus without complication ([place])          -Examined patient. -Discussed and educated patient on diabetic foot care, especially with  regards to the vascular, neurological and musculoskeletal systems.  -Stressed the importance of good glycemic control and the detriment of not  controlling glucose levels in relation to the foot. -Mechanically debrided all nails 1-5 bilateral using sterile nail nipper and filed with dremel without incident  -Answered all patient questions -Patient to return  in 2.5 months for at risk foot care -Patient advised to call the office if any problems or questions arise in the meantime.  Landis Martins, DPM

## 2018-07-29 DIAGNOSIS — C50911 Malignant neoplasm of unspecified site of right female breast: Secondary | ICD-10-CM | POA: Diagnosis not present

## 2018-07-29 DIAGNOSIS — Z9012 Acquired absence of left breast and nipple: Secondary | ICD-10-CM | POA: Diagnosis not present

## 2018-08-04 NOTE — Progress Notes (Signed)
[facility] [place], [place] [address] Phone: [phone] Subjective:    I Teresa Barnes am serving as a Education administrator for Dr. Hulan Saas.   CC: Right hip pain, right knee pain, left shoulder pain  IWP:[id number]  Teresa Barnes is a 72 y.o. female coming in with complaint of right hip pain. Right knee is giving out and gets stiff. Still has numbness. Left shoulder is sore. Last hip injection didn't help.  Has been seen previously for more of a greater trochanteric bursitis.  Patient did have an injection 3 months ago.  Patient also has back pain but does not want another epidural at this time.  Feels limits the right leg further down the hip and words pain.  Worsening pain near the lateral aspect of the knee.  Denies fevers or chills.  States that it feels like an increasing instability. Left shoulder has been having worsening symptoms again as well.  Has had a frozen shoulder previously.  Feels like the range of motion is trying to decrease again.  Right side has a rotator cuff tear.  Patient is looking for some relief from this unrelenting pain.    Past Medical History:  Diagnosis Date  . Anemia   . Blood transfusion without reported diagnosis 07/2016  . Cancer ([place]) 1991   breast  . Chronic gout 12/09/2017   On allopurinol  . Diabetes mellitus without complication ([place])   . Esophageal stricture 12/09/2017   S/p dilation by GI: Dr. Scarlette Shorts; due to h/o severe recurrent erosive esophagitis.  . 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 GERD (gastroesophageal reflux disease)   . Glaucoma   . Hiatal hernia 12/09/2017  . Hyperlipidemia   . Hypertension   . Right rotator cuff tear 11/24/2015   Injected this and 26 2017  . Wears glasses    Past Surgical History:  Procedure Laterality Date  . CATARACT EXTRACTION W/ INTRAOCULAR LENS  IMPLANT, BILATERAL  2000  . COLONOSCOPY    . cyst on spine    .  ESOPHAGOGASTRODUODENOSCOPY (EGD) WITH PROPOFOL N/A 07/31/2016   Procedure: ESOPHAGOGASTRODUODENOSCOPY (EGD) WITH PROPOFOL;  Surgeon: Manus Gunning, MD;  Location: [facility];  Service: Gastroenterology;  Laterality: N/A;  . EYE SURGERY  2011   lt eye blled  . MASTECTOMY MODIFIED RADICAL Right 1990  . REDUCTION MAMMAPLASTY Left   . TRIGGER FINGER RELEASE Right 06/24/2013   Procedure: RELEASE TRIGGER FINGER/A-1 PULLEY PIP RIGHT LONG FINGER;  Surgeon: Cammie Sickle., MD;  Location: [facility];  Service: Orthopedics;  Laterality: Right;  . TRIGGER FINGER RELEASE    . TUBAL LIGATION     Social History   Socioeconomic History  . Marital status: Married    Spouse name: robert  . Number of children: 2  . Years of education: Not on file  . Highest education level: Not on file  Occupational History  . Occupation: retired Educational psychologist; Well[employer] geologist    Comment: 2006  Social Needs  . Financial resource strain: Not on file  . Food insecurity:    Worry: Not on file    Inability: Not on file  . Transportation needs:    Medical: Not on file    Non-medical: Not on file  Tobacco Use  . Smoking status: Former Smoker    Packs/day: 0.25    Years: 10.00    Pack years: 2.50    Types: Cigarettes  Last attempt to quit: 06/18/1993    Years since quitting: 25.1  . Smokeless tobacco: Never Used  Substance and Sexual Activity  . Alcohol use: No  . Drug use: No  . Sexual activity: Never  Lifestyle  . Physical activity:    Days per week: Not on file    Minutes per session: Not on file  . Stress: Not on file  Relationships  . Social connections:    Talks on phone: Not on file    Gets together: Not on file    Attends religious service: Not on file    Active member of club or organization: Not on file    Attends meetings of clubs or organizations: Not on file    Relationship status: Not on file  Other Topics Concern  . Not on file  Social History Narrative    Originally from [place], [place]; worked in [employer] for [employer]; longevity in family   Allergies  Allergen Reactions  . Prednisone Nausea And Vomiting  . Tramadol Nausea Only   Family History  Problem Relation Age of Onset  . Diabetes Mother   . Diabetes Daughter   . Arthritis Daughter   . Hyperlipidemia Daughter   . Diabetes Son   . Arthritis Son   . Diabetes Sister   . Colon cancer Neg Hx   . Cancer Neg Hx   . Heart disease Neg Hx   . Breast cancer Neg Hx     Current Outpatient Medications (Endocrine & Metabolic):  .  metFORMIN (GLUCOPHAGE) 1000 MG tablet, Take 1 tablet (1,000 mg total) by mouth 2 (two) times daily. Marland Kitchen  VICTOZA 18 MG/3ML SOPN, Inject 0.3 mLs (1.8 mg total) into the skin daily.  Current Outpatient Medications (Cardiovascular):  .  lisinopril (PRINIVIL,ZESTRIL) 5 MG tablet, Take 1 tablet (5 mg total) by mouth daily. Marland Kitchen  lovastatin (MEVACOR) 40 MG tablet, Take 1 tablet (40 mg total) by mouth at bedtime.   Current Outpatient Medications (Analgesics):  .  allopurinol (ZYLOPRIM) 100 MG tablet, Take 1 tablet (100 mg total) by mouth daily.  Current Outpatient Medications (Hematological):  .  folic acid (FOLVITE) 1 MG tablet, Take 1 mg by mouth daily.  Current Outpatient Medications (Other):  .  bimatoprost (LUMIGAN) 0.01 % SOLN, Place 1 drop into both eyes nightly. 1 drop both eyes .  COMBIGAN 0.2-0.5 % ophthalmic solution,  .  gabapentin (NEURONTIN) 100 MG capsule, TAKE 2 CAPSULES BY MOUTH AT BEDTIME .  NOVOFINE PLUS 32G X 4 MM MISC, Check sugars once per day. Marland Kitchen  omeprazole (PRILOSEC) 40 MG capsule, TAKE 1 CAPSULE BY MOUTH ONCE DAILY .  SUPER B COMPLEX/C PO, Take by mouth.    Past medical history, social, surgical and family history all reviewed in electronic medical record.  No pertanent information unless stated regarding to the chief complaint.   Review of Systems:  No headache, visual changes, nausea, vomiting, diarrhea, constipation, dizziness, abdominal pain,  skin rash, fevers, chills, night sweats, weight loss, swollen lymph nodes, body aches, joint swelling, chest pain, shortness of breath, mood changes.  Positive muscle aches  Objective  Blood pressure 100/60, pulse (!) 59, height 5\' 2"  (1.575 m), weight 183 lb (83 kg), SpO2 96 %.     General: No apparent distress alert and oriented x3 mood and affect normal, dressed appropriately.  HEENT: Pupils equal, extraocular movements intact  Respiratory: Patient's speak in full sentences and does not appear short of breath  Cardiovascular: Trace lower extremity edema, non tender,  no erythema  Skin: Warm dry intact with no signs of infection or rash on extremities or on axial skeleton.  Abdomen: Soft nontender  Neuro: Cranial nerves II through XII are intact, neurovascularly intact in all extremities with 2+ DTRs and 2+ pulses.  Lymph: No lymphadenopathy of posterior or anterior cervical chain or axillae bilaterally.  Gait antalgic gait walking with the aid of a cane MSK:  Non tender with full range of motion and good stability and symmetric strength and tone of  elbows, wrist, hip, and ankles bilaterally.  Right knee does show some crepitus noted.  Mild instability with valgus force.  Near full range of motion.  Mild positive McMurray's. Right hip still moderately tender to palpation over the greater trochanteric area with mild limited range of motion in all planes. Left shoulder exam shows mild loss of the last 10 degrees of forward flexion, only 5 degrees of external rotation.  Positive impingement.  4+ out of 5 strength.  Patient does have weakness still of the right shoulder when comparatively.  Procedure: Real-time Ultrasound Guided Injection of left glenohumeral joint Device: GE Logiq E  Ultrasound guided injection is preferred based studies that show increased duration, increased effect, greater accuracy, decreased procedural pain, increased response rate with ultrasound guided versus blind  injection.  Verbal informed consent obtained.  Time-out conducted.  Noted no overlying erythema, induration, or other signs of local infection.  Skin prepped in a sterile fashion.  Local anesthesia: Topical Ethyl chloride.  With sterile technique and under real time ultrasound guidance:  Joint visualized.  21g 2 inch needle inserted posterior approach. Pictures taken for needle placement. Patient did have injection of 2 cc of 0.5% Marcaine, and 1cc of Kenalog 40 mg/dL. Completed without difficulty  Pain immediately resolved suggesting accurate placement of the medication.  Advised to call if fevers/chills, erythema, induration, drainage, or persistent bleeding.  Images permanently stored and available for review in the ultrasound unit.  Impression: Technically successful ultrasound guided injection.  After informed written and verbal consent, patient was seated on exam table. Right knee was prepped with alcohol swab and utilizing anterolateral approach, patient's right knee space was injected with 4:1  marcaine 0.5%: Kenalog 40mg /dL. Patient tolerated the procedure well without immediate complications.   Impression and Recommendations:     This case required medical decision making of moderate complexity. The above documentation has been reviewed and is accurate and complete Lyndal Pulley, DO       Note: This dictation was prepared with Dragon dictation along with smaller phrase technology. Any transcriptional errors that result from this process are unintentional.

## 2018-08-05 ENCOUNTER — Ambulatory Visit: Payer: Medicare Other | Admitting: Family Medicine

## 2018-08-05 ENCOUNTER — Encounter: Payer: Self-pay | Admitting: Family Medicine

## 2018-08-05 ENCOUNTER — Ambulatory Visit: Payer: Self-pay

## 2018-08-05 VITALS — BP 100/60 | HR 59 | Ht 62.0 in | Wt 183.0 lb

## 2018-08-05 DIAGNOSIS — M7502 Adhesive capsulitis of left shoulder: Secondary | ICD-10-CM | POA: Insufficient documentation

## 2018-08-05 DIAGNOSIS — M25519 Pain in unspecified shoulder: Principal | ICD-10-CM

## 2018-08-05 DIAGNOSIS — M5416 Radiculopathy, lumbar region: Secondary | ICD-10-CM

## 2018-08-05 DIAGNOSIS — M1711 Unilateral primary osteoarthritis, right knee: Secondary | ICD-10-CM

## 2018-08-05 DIAGNOSIS — M7061 Trochanteric bursitis, right hip: Secondary | ICD-10-CM | POA: Diagnosis not present

## 2018-08-05 DIAGNOSIS — G8929 Other chronic pain: Secondary | ICD-10-CM

## 2018-08-05 NOTE — Assessment & Plan Note (Signed)
Injected today.  Tolerated the procedure well.  Discussed icing regimen.  Monitor blood sugars.  Likely secondary to the diabetes.  Follow-up again in 4 to 8 weeks

## 2018-08-05 NOTE — Assessment & Plan Note (Signed)
Stable.  Did not respond as well to the injection.  Concern for lumbar radiculopathy which is in the differential and likely contributing.  Encourage patient to increase gabapentin short-term and see how patient responds.

## 2018-08-05 NOTE — Patient Instructions (Signed)
Good to see you  Sorry you are hurting so  Injected the shoulder and the knee.  I really hope it helps soon.  Watch blood sugars a little bit closer for 3 days  Ice 20 minutes 2 times daily. Usually after activity and before bed. Exercises 3 times a week.  See me again in 6 weeks to make sure you are doing better

## 2018-08-05 NOTE — Assessment & Plan Note (Signed)
Known arthritic changes.  Given injection.  Tolerated the procedure well.  Could be candidate for Visco supplementation if needed.  Discussed icing regimen and home exercises.  Follow-up again in 4 to 8 weeks

## 2018-08-12 DIAGNOSIS — C50911 Malignant neoplasm of unspecified site of right female breast: Secondary | ICD-10-CM | POA: Diagnosis not present

## 2018-09-08 NOTE — Progress Notes (Signed)
[name] [place],  [address] Phone: [phone] Subjective:    I Teresa Barnes am serving as a Education administrator for Dr. Hulan Saas.   I'm seeing this patient by the request  of:    CC: Shoulder pain.    Seems to be doing relatively well overall.  Patient was having more leg weakness.  States that is very severe.  We do believe that this is probably more of a lumbar radiculopathy.  Patient states that the knee injection did not help at all.  Rates the severity pain is 9 out of 10.  XAJ:[id number]  Teresa Barnes is a 72 y.o. female coming in with complaint of shoulder pain. Shoulder is doing better. Her leg is still painful. Injection last visit did not help. Leg is weak and stiff.       Past Medical History:  Diagnosis Date  . Anemia   . Blood transfusion without reported diagnosis 07/2016  . Cancer ([place]) 1991   breast  . Chronic gout 12/09/2017   On allopurinol  . Diabetes mellitus without complication ([place])   . Esophageal stricture 12/09/2017   S/p dilation by GI: Dr. Scarlette Shorts; due to h/o severe recurrent erosive esophagitis.  . 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 GERD (gastroesophageal reflux disease)   . Glaucoma   . Hiatal hernia 12/09/2017  . Hyperlipidemia   . Hypertension   . Right rotator cuff tear 11/24/2015   Injected this and 26 2017  . Wears glasses    Past Surgical History:  Procedure Laterality Date  . CATARACT EXTRACTION W/ INTRAOCULAR LENS  IMPLANT, BILATERAL  2000  . COLONOSCOPY    . cyst on spine    . ESOPHAGOGASTRODUODENOSCOPY (EGD) WITH PROPOFOL N/A 07/31/2016   Procedure: ESOPHAGOGASTRODUODENOSCOPY (EGD) WITH PROPOFOL;  Surgeon: Manus Gunning, MD;  [facility]: [facility];  Service: Gastroenterology;  Laterality: N/A;  . EYE SURGERY  2011   lt eye blled  . MASTECTOMY MODIFIED RADICAL Right 1990  . REDUCTION MAMMAPLASTY Left   . TRIGGER FINGER RELEASE Right  06/24/2013   Procedure: RELEASE TRIGGER FINGER/A-1 Barnes PIP RIGHT LONG FINGER;  Surgeon: Cammie Sickle., MD;  Location: [facility];  Service: Orthopedics;  Laterality: Right;  . TRIGGER FINGER RELEASE    . TUBAL LIGATION     Social History   Socioeconomic History  . Marital status: Married    Spouse name: robert  . Number of children: 2  . Years of education: Not on file  . Highest education level: Not on file  Occupational History  . Occupation: retired Educational psychologist; Wellsite geologist    Comment: 2006  Social Needs  . Financial resource strain: Not on file  . Food insecurity:    Worry: Not on file    Inability: Not on file  . Transportation needs:    Medical: Not on file    Non-medical: Not on file  Tobacco Use  . Smoking status: Former Smoker    Packs/day: 0.25    Years: 10.00    Pack years: 2.50    Types: Cigarettes    Last attempt to quit: 06/18/1993    Years since quitting: 25.2  . Smokeless tobacco: Never Used  Substance and Sexual Activity  . Alcohol use: No  . Drug use: No  . Sexual activity: Never  Lifestyle  . Physical activity:    Days per week: Not on file  Minutes per session: Not on file  . Stress: Not on file  Relationships  . Social connections:    Talks on phone: Not on file    Gets together: Not on file    Attends religious service: Not on file    Active member of club or organization: Not on file    Attends meetings of clubs or organizations: Not on file    Relationship status: Not on file  Other Topics Concern  . Not on file  Social History Narrative   Originally from [place], [place]; worked in [employer] for [employer]; longevity in family   Allergies  Allergen Reactions  . Prednisone Nausea And Vomiting  . Tramadol Nausea Only   Family History  Problem Relation Age of Onset  . Diabetes Mother   . Diabetes Daughter   . Arthritis Daughter   . Hyperlipidemia Daughter   . Diabetes Son   . Arthritis Son   . Diabetes Sister   .  Colon cancer Neg Hx   . Cancer Neg Hx   . Heart disease Neg Hx   . Breast cancer Neg Hx     Current Outpatient Medications (Endocrine & Metabolic):  .  metFORMIN (GLUCOPHAGE) 1000 MG tablet, Take 1 tablet (1,000 mg total) by mouth 2 (two) times daily. Marland Kitchen  VICTOZA 18 MG/3ML SOPN, Inject 0.3 mLs (1.8 mg total) into the skin daily.  Current Outpatient Medications (Cardiovascular):  .  lisinopril (PRINIVIL,ZESTRIL) 5 MG tablet, Take 1 tablet (5 mg total) by mouth daily. Marland Kitchen  lovastatin (MEVACOR) 40 MG tablet, Take 1 tablet (40 mg total) by mouth at bedtime.   Current Outpatient Medications (Analgesics):  .  allopurinol (ZYLOPRIM) 100 MG tablet, Take 1 tablet (100 mg total) by mouth daily.  Current Outpatient Medications (Hematological):  .  folic acid (FOLVITE) 1 MG tablet, Take 1 mg by mouth daily.  Current Outpatient Medications (Other):  .  bimatoprost (LUMIGAN) 0.01 % SOLN, Place 1 drop into both eyes nightly. 1 drop both eyes .  COMBIGAN 0.2-0.5 % ophthalmic solution,  .  gabapentin (NEURONTIN) 100 MG capsule, TAKE 2 CAPSULES BY MOUTH AT BEDTIME .  NOVOFINE PLUS 32G X 4 MM MISC, Check sugars once per day. Marland Kitchen  omeprazole (PRILOSEC) 40 MG capsule, TAKE 1 CAPSULE BY MOUTH ONCE DAILY .  SUPER B COMPLEX/C PO, Take by mouth.    Past medical history, social, surgical and family history all reviewed in electronic medical record.  No pertanent information unless stated regarding to the chief complaint.   Review of Systems:  No headache, visual changes, nausea, vomiting, diarrhea, constipation, dizziness, abdominal pain, skin rash, fevers, chills, night sweats, weight loss, swollen lymph nodes, body aches, joint swelling,  chest pain, shortness of breath, mood changes.  Positive muscle aches  Objective  Blood pressure 116/60, pulse 64, height 5\' 2"  (1.575 m), weight 181 lb (82.1 kg), SpO2 97 %.   General: No apparent distress alert and oriented x3 mood and affect normal, dressed  appropriately.  HEENT: Pupils equal, extraocular movements intact  Respiratory: Patient's speak in full sentences and does not appear short of breath  Cardiovascular: No lower extremity edema, non tender, no erythema  Skin: Warm dry intact with no signs of infection or rash on extremities or on axial skeleton.  Abdomen: Soft nontender  Neuro: Cranial nerves II through XII are intact, neurovascularly intact in all extremities with 2+ DTRs and 2+ pulses.  Lymph: No lymphadenopathy of posterior or anterior cervical chain or axillae bilaterally.  Gait normal with good balance and coordination.  MSK:  Nn tender with limited range of motion and good stability and symmetric strength and tone of shoulders, elbows, wrist, hip, knee and ankles bilaterally.   Back Exam:  Inspection: Loss of lordosis Motion: Flexion 45 deg, Extension 25 deg, Side Bending to 35 deg bilaterally,  Rotation to 45 deg bilaterally  SLR laying: Negative  XSLR laying: Negative  Palpable tenderness: Tender to palpation in the paraspinal musculature lumbar spine right greater than left. FABER: Positive right Corky Sox. Sensory change: Gross sensation intact to all lumbar and sacral dermatomes.  Reflexes: 2+ at both patellar tendons, 2+ at achilles tendons, Babinski's downgoing.  Strength at foot  Plantar-flexion: 5/5 Dorsi-flexion: 5/5 Eversion: 5/5 Inversion: 5/5  Leg strength  Quad: 5/5 Hamstring: 5/5 Hip flexor: 5/5 Hip abductors: 5/5  Gait unremarkable.    Impression and Recommendations:     This case required medical decision making of moderate complexity. The above documentation has been reviewed and is accurate and complete Teresa Pulley, DO       Note: This dictation was prepared with Dragon dictation along with smaller phrase technology. Any transcriptional errors that result from this process are unintentional.

## 2018-09-09 ENCOUNTER — Ambulatory Visit: Payer: Medicare Other | Admitting: Family Medicine

## 2018-09-09 ENCOUNTER — Encounter: Payer: Self-pay | Admitting: Family Medicine

## 2018-09-09 DIAGNOSIS — M5416 Radiculopathy, lumbar region: Secondary | ICD-10-CM | POA: Diagnosis not present

## 2018-09-09 DIAGNOSIS — M1711 Unilateral primary osteoarthritis, right knee: Secondary | ICD-10-CM | POA: Diagnosis not present

## 2018-09-09 NOTE — Patient Instructions (Signed)
Good to see you  Piriformis injection could help  We will get you scheduled tomorrow  If you do not want to do it please call [phone] and reschedule.

## 2018-09-09 NOTE — Progress Notes (Signed)
[name] [place], [place] [address] Phone: [phone] Subjective:    I [name] am serving as a Education administrator for Dr. Hulan Saas.    CC: Right back and hip pain follow-up  PRF:[id number]  Teresa Barnes is a 72 y.o. female coming in with complaint of hip pain. Here for piriformis injection.  Patient was seen earlier in the week.  Wanted to have the piriformis injection but did not have a driver.  Patient is still having the same pain.  Seems to come from the back.  MRI did show potential nerve impingement.  Patient had side effects to the epidural and would like to avoid that ever again.  Patient still states that the leg pain seems to be the worst.  Attempted a knee injection with no significant benefit.     Past Medical History:  Diagnosis Date  . Anemia   . Blood transfusion without reported diagnosis 07/2016  . Cancer ([place]) 1991   breast  . Chronic gout 12/09/2017   On allopurinol  . Diabetes mellitus without complication ([place])   . Esophageal stricture 12/09/2017   S/p dilation by GI: Dr. Scarlette Shorts; due to h/o severe recurrent erosive esophagitis.  . 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 GERD (gastroesophageal reflux disease)   . Glaucoma   . Hiatal hernia 12/09/2017  . Hyperlipidemia   . Hypertension   . Right rotator cuff tear 11/24/2015   Injected this and 26 2017  . Wears glasses    Past Surgical History:  Procedure Laterality Date  . CATARACT EXTRACTION W/ INTRAOCULAR LENS  IMPLANT, BILATERAL  2000  . COLONOSCOPY    . cyst on spine    . ESOPHAGOGASTRODUODENOSCOPY (EGD) WITH PROPOFOL N/A 07/31/2016   Procedure: ESOPHAGOGASTRODUODENOSCOPY (EGD) WITH PROPOFOL;  Surgeon: Manus Gunning, MD;  Location: [facility];  Service: Gastroenterology;  Laterality: N/A;  . EYE SURGERY  2011   lt eye blled  . MASTECTOMY MODIFIED RADICAL Right 1990  . REDUCTION MAMMAPLASTY Left   . TRIGGER  FINGER RELEASE Right 06/24/2013   Procedure: RELEASE TRIGGER FINGER/A-1 PULLEY PIP RIGHT LONG FINGER;  Surgeon: Cammie Sickle., MD;  Location: [facility];  Service: Orthopedics;  Laterality: Right;  . TRIGGER FINGER RELEASE    . TUBAL LIGATION     Social History   Socioeconomic History  . Marital status: Married    Spouse name: robert  . Number of children: 2  . Years of education: Not on file  . Highest education level: Not on file  Occupational History  . Occupation: retired Educational psychologist; [employer]ologist    Comment: 2006  Social Needs  . Financial resource strain: Not on file  . Food insecurity:    Worry: Not on file    Inability: Not on file  . Transportation needs:    Medical: Not on file    Non-medical: Not on file  Tobacco Use  . Smoking status: Former Smoker    Packs/day: 0.25    Years: 10.00    Pack years: 2.50    Types: Cigarettes    Last attempt to quit: 06/18/1993    Years since quitting: 25.2  . Smokeless tobacco: Never Used  Substance and Sexual Activity  . Alcohol use: No  . Drug use: No  . Sexual activity: Never  Lifestyle  . Physical activity:    Days per week: Not on file  Minutes per session: Not on file  . Stress: Not on file  Relationships  . Social connections:    Talks on phone: Not on file    Gets together: Not on file    Attends religious service: Not on file    Active member of club or organization: Not on file    Attends meetings of clubs or organizations: Not on file    Relationship status: Not on file  Other Topics Concern  . Not on file  Social History Narrative   Originally from [place], [place]; worked in [employer] for [place]; longevity in family   Allergies  Allergen Reactions  . Prednisone Nausea And Vomiting  . Tramadol Nausea Only   Family History  Problem Relation Age of Onset  . Diabetes Mother   . Diabetes Daughter   . Arthritis Daughter   . Hyperlipidemia Daughter   . Diabetes Son   . Arthritis Son   .  Diabetes Sister   . Colon cancer Neg Hx   . Cancer Neg Hx   . Heart disease Neg Hx   . Breast cancer Neg Hx     Current Outpatient Medications (Endocrine & Metabolic):  .  metFORMIN (GLUCOPHAGE) 1000 MG tablet, Take 1 tablet (1,000 mg total) by mouth 2 (two) times daily. Marland Kitchen  VICTOZA 18 MG/3ML SOPN, Inject 0.3 mLs (1.8 mg total) into the skin daily.  Current Outpatient Medications (Cardiovascular):  .  lisinopril (PRINIVIL,ZESTRIL) 5 MG tablet, Take 1 tablet (5 mg total) by mouth daily. Marland Kitchen  lovastatin (MEVACOR) 40 MG tablet, Take 1 tablet (40 mg total) by mouth at bedtime.   Current Outpatient Medications (Analgesics):  .  allopurinol (ZYLOPRIM) 100 MG tablet, Take 1 tablet (100 mg total) by mouth daily.  Current Outpatient Medications (Hematological):  .  folic acid (FOLVITE) 1 MG tablet, Take 1 mg by mouth daily.  Current Outpatient Medications (Other):  .  bimatoprost (LUMIGAN) 0.01 % SOLN, Place 1 drop into both eyes nightly. 1 drop both eyes .  COMBIGAN 0.2-0.5 % ophthalmic solution,  .  gabapentin (NEURONTIN) 100 MG capsule, TAKE 2 CAPSULES BY MOUTH AT BEDTIME .  NOVOFINE PLUS 32G X 4 MM MISC, Check sugars once per day. Marland Kitchen  omeprazole (PRILOSEC) 40 MG capsule, TAKE 1 CAPSULE BY MOUTH ONCE DAILY .  SUPER B COMPLEX/C PO, Take by mouth.    Past medical history, social, surgical and family history all reviewed in electronic medical record.  No pertanent information unless stated regarding to the chief complaint.   Review of Systems:  No headache, visual changes, nausea, vomiting, diarrhea, constipation, dizziness, abdominal pain, skin rash, fevers, chills, night sweats, weight loss, swollen lymph nodes, body aches, joint swelling, muscle aches, chest pain, shortness of breath, mood changes.   Objective  There were no vitals taken for this visit. Systems examined below as of    General: No apparent distress alert and oriented x3 mood and affect normal, dressed appropriately.    HEENT: Pupils equal, extraocular movements intact  Respiratory: Patient's speak in full sentences and does not appear short of breath  Cardiovascular: No lower extremity edema, non tender, no erythema  Skin: Warm dry intact with no signs of infection or rash on extremities or on axial skeleton.  Abdomen: Soft nontender  Neuro: Cranial nerves II through XII are intact, neurovascularly intact in all extremities with 2+ DTRs and 2+ pulses.  Lymph: No lymphadenopathy of posterior or anterior cervical chain or axillae bilaterally.  Gait really antalgic MSK:  tender with full range of motion and good stability and symmetric strength and tone of shoulders, elbows, wrist, hip, knee and ankles bilaterally.  Knee: Right valgus deformity noted.  Abnormal thigh to calf ratio.  Tender to palpation over medial and PF joint line.  ROM full in flexion and extension and lower leg rotation. instability with valgus force.  painful patellar compression. Patellar glide with moderate crepitus. Patellar and quadriceps tendons unremarkable. Hamstring and quadriceps strength is normal. Contralateral knee shows mild arthritic changes.  Back exam does have loss of lordosis.  Patient does have a positive Faber test.  Severe tenderness over the piriformis but also over the paraspinal musculature lumbar spine.  Procedure: Real-time Ultrasound Guided Injection of right piriformis tendon sheath Device: GE Logiq Q7 Ultrasound guided injection is preferred based studies that show increased duration, increased effect, greater accuracy, decreased procedural pain, increased response rate, and decreased cost with ultrasound guided versus blind injection.  Verbal informed consent obtained.  Time-out conducted.  Noted no overlying erythema, induration, or other signs of local infection.  Skin prepped in a sterile fashion.  Local anesthesia: Topical Ethyl chloride.  With sterile technique and under real time ultrasound  guidance: With a 23-gauge 2 inch needle patient was injected with 1 cc of 0.5% Marcaine and 1 cc of Kenalog 40 mg/mL Completed without difficulty  Pain immediately resolved suggesting accurate placement of the medication.  Advised to call if fevers/chills, erythema, induration, drainage, or persistent bleeding.  Images permanently stored and available for review in the ultrasound unit.  Impression: Technically successful ultrasound guided injection.    Impression and Recommendations:     This case required medical decision making of moderate complexity. The above documentation has been reviewed and is accurate and complete Lyndal Pulley, DO       Note: This dictation was prepared with Dragon dictation along with smaller phrase technology. Any transcriptional errors that result from this process are unintentional.

## 2018-09-09 NOTE — Assessment & Plan Note (Signed)
No improvement with the injection.

## 2018-09-09 NOTE — Assessment & Plan Note (Signed)
I believe the patient's right leg pain is more secondary to the lumbar radiculopathy.  Patient is more concerned that this could be from somewhere else.  We attempted an injection of the knee with no significant improvement.  Patient has responded only mildly to the gabapentin.  We discussed the possibility of a piriformis injection but patient wanted this to be set up at a later date.  We discussed icing regimen and home exercises.  Discussed core strengthening.  Patient would want to avoid any type of surgical intervention and does not want another epidural secondary to having significant amount of pain and trouble with blood glucose afterwards.  Patient will follow-up for the piriformis injection if she elects to do that at a later date.  Spent  25 minutes with patient face-to-face and had greater than 50% of counseling including as described above in assessment and plan.

## 2018-09-10 ENCOUNTER — Ambulatory Visit: Payer: Medicare Other | Admitting: Family Medicine

## 2018-09-10 ENCOUNTER — Ambulatory Visit: Payer: Self-pay

## 2018-09-10 ENCOUNTER — Encounter: Payer: Self-pay | Admitting: Family Medicine

## 2018-09-10 VITALS — BP 140/60 | HR 70 | Ht 62.0 in | Wt 181.0 lb

## 2018-09-10 DIAGNOSIS — M25551 Pain in right hip: Secondary | ICD-10-CM | POA: Diagnosis not present

## 2018-09-10 DIAGNOSIS — G5701 Lesion of sciatic nerve, right lower limb: Secondary | ICD-10-CM | POA: Diagnosis not present

## 2018-09-10 NOTE — Assessment & Plan Note (Signed)
Right piriformis syndrome.  Discussed icing regimen.  Patient given injection today.  Tolerated the procedure well.  Patient wants to avoid any epidural again.  Hoping the patient will get improvement.  Restart the exercises on Monday.  Follow-up with me again in 4 weeks

## 2018-09-10 NOTE — Patient Instructions (Addendum)
Good to see you  Take the weekend off Start the exercises again on Monday 3 times a week  See me again in 4 weeks

## 2018-09-17 ENCOUNTER — Ambulatory Visit: Payer: Medicare Other | Admitting: Family Medicine

## 2018-09-18 ENCOUNTER — Other Ambulatory Visit: Payer: Self-pay | Admitting: Family Medicine

## 2018-09-24 ENCOUNTER — Other Ambulatory Visit: Payer: Self-pay

## 2018-09-24 ENCOUNTER — Ambulatory Visit (INDEPENDENT_AMBULATORY_CARE_PROVIDER_SITE_OTHER): Payer: Medicare Other | Admitting: Family Medicine

## 2018-09-24 ENCOUNTER — Encounter: Payer: Self-pay | Admitting: Family Medicine

## 2018-09-24 VITALS — BP 122/76 | HR 70 | Temp 98.0°F | Ht 62.0 in | Wt 176.9 lb

## 2018-09-24 DIAGNOSIS — E782 Mixed hyperlipidemia: Secondary | ICD-10-CM

## 2018-09-24 DIAGNOSIS — I10 Essential (primary) hypertension: Secondary | ICD-10-CM

## 2018-09-24 DIAGNOSIS — E119 Type 2 diabetes mellitus without complications: Secondary | ICD-10-CM | POA: Diagnosis not present

## 2018-09-24 DIAGNOSIS — D638 Anemia in other chronic diseases classified elsewhere: Secondary | ICD-10-CM | POA: Diagnosis not present

## 2018-09-24 DIAGNOSIS — E1159 Type 2 diabetes mellitus with other circulatory complications: Secondary | ICD-10-CM

## 2018-09-24 HISTORY — DX: Anemia in other chronic diseases classified elsewhere: D63.8

## 2018-09-24 LAB — POCT GLYCOSYLATED HEMOGLOBIN (HGB A1C): Hemoglobin A1C: 6.2 % — AB (ref 4.0–5.6)

## 2018-09-24 MED ORDER — ZOSTER VAC RECOMB ADJUVANTED 50 MCG/0.5ML IM SUSR: 0.5000 mL | Freq: Once | INTRAMUSCULAR | 0 refills | Status: AC

## 2018-09-24 MED ORDER — VICTOZA 18 MG/3ML ~~LOC~~ SOPN: 1.2000 mg | PEN_INJECTOR | Freq: Every day | SUBCUTANEOUS | 3 refills | Status: DC

## 2018-09-24 NOTE — Patient Instructions (Signed)
Please return in 3 months for diabetes follow up  We are decreasing our victoza dose to 1.2mg  daily.  Keep taking your metformin twice a day.  Keep taking the iron.  If you have any questions or concerns, please don't hesitate to send me a message via MyChart or call the office at [phone]. Thank you for visiting with [place] today! It's our pleasure caring for you.

## 2018-09-24 NOTE — Progress Notes (Signed)
Subjective  CC:  Chief Complaint  Patient presents with  . Diabetes    last a1c done on 06/22/2018= 6.3, doing well     HPI: Teresa Barnes is a 72 y.o. female who presents to the office today for follow up of diabetes and problems listed above in the chief complaint.   Diabetes follow up: Her diabetic control is reported as stable. . Had steroid injection a few weeks ago: fasting sugars have elevated to max of 170. Had been stable at 110-120s. Worries about getting low sugars so was only taking metformin once a day. Takes victoza daily.  She denies exertional CP or SOB or symptomatic hypoglycemia. She denies foot sores or paresthesias.   Anemia: chronic disease +/- iron deficiency. On iron supplements now. Has h/o UGI bleed but denies current UGI sxs or melena. Tolerating iron qod. Energy is good  HTN has been well controlled  HLD: tolerating statin.   Assessment  1. Controlled type 2 diabetes mellitus without complication, without long-term current use of insulin ([place])   2. Hypertension associated with diabetes ([place])   3. Anemia of chronic disease   4. Mixed hyperlipidemia      Plan   Diabetes is currently very well controlled. Will decrease victoza dosing. Goals a1c 6.5-7. Education given. Eye exam is up to date; has another one next week. Pneumovax today. Flu shot up to date .  shingrix RX given.   bp is well controlled.   Lipids are at goal on statin.    Follow up: Return in about 3 months (around 12/25/2018) for follow up of diabetes and hypertension.. Orders Placed This Encounter  Procedures  . Pneumococcal polysaccharide vaccine 23-valent greater than or equal to 2yo subcutaneous/IM  . POCT glycosylated hemoglobin (Hb A1C)   Meds ordered this encounter  Medications  . VICTOZA 18 MG/3ML SOPN    Sig: Inject 0.2 mLs (1.2 mg total) into the skin daily.    Dispense:  9 pen    Refill:  3  . Zoster Vaccine Adjuvanted [facility]) injection    Sig: Inject 0.5 mLs into  the muscle once for 1 dose. Please give 2nd dose 2-6 months after first dose    Dispense:  2 each    Refill:  0      Immunization History  Administered Date(s) Administered  . Influenza, High Dose Seasonal PF 08/18/2017, 07/15/2018  . Influenza,inj,Quad PF,6+ Mos 08/01/2016  . Influenza-Unspecified 08/18/2014  . Pneumococcal Conjugate-13 12/09/2017  . Pneumococcal Polysaccharide-23 09/24/2018    Diabetes Related Lab Review: Lab Results  Component Value Date   HGBA1C 6.2 (A) 09/24/2018   HGBA1C 6.3 (A) 06/22/2018   HGBA1C 6.1 03/09/2018    Lab Results  Component Value Date   MICROALBUR 8.1 (H) 03/09/2018   Lab Results  Component Value Date   CREATININE 1.20 03/09/2018   BUN 29 (H) 03/09/2018   NA 138 03/09/2018   K 4.6 03/09/2018   CL 100 03/09/2018   CO2 22 03/09/2018   Lab Results  Component Value Date   CHOL 175 03/09/2018   CHOL 203 (H) 12/09/2017   Lab Results  Component Value Date   HDL 64.30 03/09/2018   HDL 57.60 12/09/2017   Lab Results  Component Value Date   LDLCALC 95 03/09/2018   LDLCALC 125 (H) 12/09/2017   Lab Results  Component Value Date   TRIG 82.0 03/09/2018   TRIG 103.0 12/09/2017   Lab Results  Component Value Date   CHOLHDL 3  03/09/2018   CHOLHDL 4 12/09/2017   No results found for: LDLDIRECT The 10-year ASCVD risk score Mikey Bussing DC Jr., et al., 2013) is: 27.8%   Values used to calculate the score:     Age: 5 years     Sex: Female     Is Non-Hispanic African American: Yes     Diabetic: Yes     Tobacco smoker: No     Systolic Blood Pressure: 937 mmHg     Is BP treated: Yes     HDL Cholesterol: 64.3 mg/dL     Total Cholesterol: 175 mg/dL I have reviewed the PMH, Fam and Soc history. Patient Active Problem List   Diagnosis Date Noted  . Mixed hyperlipidemia 12/09/2017    Priority: High  . Hypertension associated with diabetes ([place]) 12/09/2017    Priority: High  . H/O: upper GI bleed 07/30/2016    Priority: High    Due to  severe erosive esophagitis; 2017; healed with PPI by egd f/u; has [place] and stricture; s/p blood transfusion. Negative for [place]ts esophagitis   . Controlled type 2 diabetes mellitus without complication, without long-term current use of insulin ([place]) 04/03/2009    Priority: High    dxd 1990; well controlled    . Anemia of chronic disease 09/24/2018    Priority: Medium  . History of right breast cancer 12/09/2017    Priority: Medium    S/p mastectomy 1990   . Hiatal hernia 12/09/2017    Priority: Medium  . Esophageal stricture 12/09/2017    Priority: Medium    S/p dilation by GI: Dr. Scarlette Shorts; due to h/o severe recurrent erosive esophagitis.   . Chronic gout 12/09/2017    Priority: Medium    On allopurinol   . Glaucoma 12/09/2017    Priority: Medium  . Degenerative arthritis of right knee 07/03/2017    Priority: Medium    Injected 07/03/2017' Injected August 05, 2018   . Right lumbar radiculopathy 01/23/2016    Priority: Medium  . Arthritis of right acromioclavicular joint 01/02/2016    Priority: Medium    Injected 01/02/2016 Repeat injection 03/12/2016   . Greater trochanteric bursitis of right hip 11/17/2017    Priority: Low    Injected November 17, 2017 Repeat injection May 05, 2018   . Normochromic anemia     Priority: Low  . Piriformis syndrome of right side 09/10/2018  . Adhesive bursitis of left shoulder 08/05/2018    Injected August 05, 2018     Social History: Patient  reports that she quit smoking about 25 years ago. Her smoking use included cigarettes. She has a 2.50 pack-year smoking history. She has never used smokeless tobacco. She reports that she does not drink alcohol or use drugs.  Review of Systems: Ophthalmic: negative for eye pain, loss of vision or double vision Cardiovascular: negative for chest pain Respiratory: negative for SOB or persistent cough Gastrointestinal: negative for abdominal pain Genitourinary: negative for dysuria  or gross hematuria MSK: negative for foot lesions Neurologic: negative for weakness or gait disturbance  Objective  Vitals: BP 122/76   Pulse 70   Temp 98 F (36.7 C)   Ht 5\' 2"  (1.575 m)   Wt 176 lb 14.4 oz (80.2 kg)   SpO2 98%   BMI 32.36 kg/m  General: well appearing, no acute distress  Psych:  Alert and oriented, normal mood and affect HEENT:  Normocephalic, atraumatic, moist mucous membranes, supple neck  Cardiovascular:  Nl S1 and S2, RRR without  murmur, gallop or rub. no edema Respiratory:  Good breath sounds bilaterally, CTAB with normal effort, no rales    Diabetic education: ongoing education regarding chronic disease management for diabetes was given today. We continue to reinforce the ABC's of diabetic management: A1c (<7 or 8 dependent upon patient), tight blood pressure control, and cholesterol management with goal LDL < 100 minimally. We discuss diet strategies, exercise recommendations, medication options and possible side effects. At each visit, we review recommended immunizations and preventive care recommendations for diabetics and stress that good diabetic control can prevent other problems. See below for this patient's data.    Commons side effects, risks, benefits, and alternatives for medications and treatment plan prescribed today were discussed, and the patient expressed understanding of the given instructions. Patient is instructed to call or message via MyChart if he/she has any questions or concerns regarding our treatment plan. No barriers to understanding were identified. We discussed Red Flag symptoms and signs in detail. Patient expressed understanding regarding what to do in case of urgent or emergency type symptoms.   Medication list was reconciled, printed and provided to the patient in AVS. Patient instructions and summary information was reviewed with the patient as documented in the AVS. This note was prepared with assistance of Dragon voice  recognition software. Occasional wrong-word or sound-a-like substitutions may have occurred due to the inherent limitations of voice recognition software

## 2018-09-29 ENCOUNTER — Ambulatory Visit: Payer: Medicare Other | Admitting: Sports Medicine

## 2018-10-06 ENCOUNTER — Ambulatory Visit: Payer: Medicare Other | Admitting: Sports Medicine

## 2018-10-06 ENCOUNTER — Encounter: Payer: Self-pay | Admitting: Sports Medicine

## 2018-10-06 DIAGNOSIS — E119 Type 2 diabetes mellitus without complications: Secondary | ICD-10-CM | POA: Diagnosis not present

## 2018-10-06 DIAGNOSIS — M79675 Pain in left toe(s): Secondary | ICD-10-CM

## 2018-10-06 DIAGNOSIS — B351 Tinea unguium: Secondary | ICD-10-CM | POA: Diagnosis not present

## 2018-10-06 DIAGNOSIS — M79674 Pain in right toe(s): Secondary | ICD-10-CM | POA: Diagnosis not present

## 2018-10-06 NOTE — Progress Notes (Signed)
Subjective: Teresa Barnes is a 72 y.o. female patient with history of diabetes who returns to office today complaining of long,mildly painful nails  while ambulating in shoes; unable to trim. Patient denies any new complaints or issues with nails. Reports the glucose reading this morning was 110. Last A1c 6.2. Patient denies any new changes in medication or new problems since last visit no changes with medical history.  Patient Active Problem List   Diagnosis Date Noted  . Anemia of chronic disease 09/24/2018  . Piriformis syndrome of right side 09/10/2018  . Adhesive bursitis of left shoulder 08/05/2018  . History of right breast cancer 12/09/2017  . Mixed hyperlipidemia 12/09/2017  . Hypertension associated with diabetes ([place]) 12/09/2017  . Hiatal hernia 12/09/2017  . Esophageal stricture 12/09/2017  . Chronic gout 12/09/2017  . Glaucoma 12/09/2017  . Greater trochanteric bursitis of right hip 11/17/2017  . Degenerative arthritis of right knee 07/03/2017  . Normochromic anemia   . H/O: upper GI bleed 07/30/2016  . Right lumbar radiculopathy 01/23/2016  . Arthritis of right acromioclavicular joint 01/02/2016  . Controlled type 2 diabetes mellitus without complication, without long-term current use of insulin ([place]) 04/03/2009   Current Outpatient Medications on File Prior to Visit  Medication Sig Dispense Refill  . allopurinol (ZYLOPRIM) 100 MG tablet Take 1 tablet (100 mg total) by mouth daily. 90 tablet 3  . bimatoprost (LUMIGAN) 0.01 % SOLN Place 1 drop into both eyes nightly. 1 drop both eyes    . COMBIGAN 0.2-0.5 % ophthalmic solution   3  . folic acid (FOLVITE) 1 MG tablet Take 1 mg by mouth daily.    Marland Kitchen gabapentin (NEURONTIN) 100 MG capsule TAKE 2 CAPSULES BY MOUTH AT BEDTIME 180 capsule 1  . lisinopril (PRINIVIL,ZESTRIL) 5 MG tablet Take 1 tablet (5 mg total) by mouth daily. 90 tablet 3  . lovastatin (MEVACOR) 40 MG tablet Take 1 tablet (40 mg total) by mouth at bedtime. 90  tablet 3  . metFORMIN (GLUCOPHAGE) 1000 MG tablet TAKE 1 TABLET BY MOUTH TWICE DAILY 180 tablet 1  . NOVOFINE PLUS 32G X 4 MM MISC Check sugars once per day. 100 each 2  . omeprazole (PRILOSEC) 40 MG capsule TAKE 1 CAPSULE BY MOUTH ONCE DAILY 30 capsule 2  . SUPER B COMPLEX/C PO Take by mouth.    Marland Kitchen VICTOZA 18 MG/3ML SOPN Inject 0.2 mLs (1.2 mg total) into the skin daily. 9 pen 3   No current facility-administered medications on file prior to visit.    Allergies  Allergen Reactions  . Prednisone Nausea And Vomiting  . Tramadol Nausea Only    Recent Results (from the past 2160 hour(s))  POCT glycosylated hemoglobin (Hb A1C)     Status: Abnormal   Collection Time: 09/24/18 11:29 AM  Result Value Ref Range   Hemoglobin A1C 6.2 (A) 4.0 - 5.6 %   HbA1c POC (<> result, manual entry)     HbA1c, POC (prediabetic range)     HbA1c, POC (controlled diabetic range)      Objective: General: Patient is awake, alert, and oriented x 3 and in no acute distress.  Integument: Skin is warm, dry and supple bilateral. Nails are tender, long, thickened and dystrophic with subungual debris, consistent with onychomycosis, 1-5 bilateral with mild incurvation left great toe without signs of infection. No open lesions or preulcerative lesions present bilateral. Remaining integument unremarkable.  Vasculature:  Dorsalis Pedis pulse 1/4 bilateral. Posterior Tibial pulse  1/4 bilateral.  Capillary  fill time <3 sec 1-5 bilateral. Positive hair growth to the level of the digits. Temperature gradient within normal limits. No varicosities present bilateral. No edema present bilateral.   Neurology: The patient has intact sensation measured with a 5.07/10g Semmes Weinstein Monofilament at all pedal sites bilateral . Vibratory sensation diminished bilateral with tuning fork. No Babinski sign present bilateral.   Musculoskeletal: No symptomatic pedal deformities noted bilateral. Muscular strength 5/5 in all lower  extremity muscular groups bilateral without pain on range of motion . No tenderness with calf compression bilateral.  Assessment and Plan: Problem List Items Addressed This Visit    None    Visit Diagnoses    Pain due to onychomycosis of toenails of both feet    -  Primary   Diabetes mellitus without complication ([place])         -Examined patient. -Discussed and educated patient on diabetic foot care, especially with  regards to the vascular, neurological and musculoskeletal systems.  -Mechanically debrided all nails 1-5 bilateral using sterile nail nipper and filed with dremel without incident  -Answered all patient questions -Patient to return  in 2.5 months for at risk foot care -Patient advised to call the office if any problems or questions arise in the meantime.  Landis Martins, DPM

## 2018-10-13 ENCOUNTER — Encounter: Payer: Self-pay | Admitting: Family Medicine

## 2018-10-13 ENCOUNTER — Ambulatory Visit: Payer: Medicare Other | Admitting: Family Medicine

## 2018-10-13 DIAGNOSIS — M5416 Radiculopathy, lumbar region: Secondary | ICD-10-CM | POA: Diagnosis not present

## 2018-10-13 NOTE — Patient Instructions (Signed)
Good to see you  Ice is yoru friend Keep trucking along Try calcium pyruvate 1500mg  daily and see if it helps See me again in 4 weeks Happy holidays!

## 2018-10-13 NOTE — Assessment & Plan Note (Signed)
Radicular symptoms.  Discussed icing regimen and home exercise.  Discussed posture and ergonomics.  Discussed avoiding certain activities.  We discussed different treatment options.  I do believe that a lot of the pain is coming from the back and patient does not want surgical intervention or any other epidural or nerve root injections.  We discussed the possibility of a sacroiliac injection which patient declined.  We did discuss that this would likely be short-lived.  Continue the same medications including the allopurinol as well as the gabapentin.  May need to increase dosing but patient also declined that today.  Patient declined formal physical therapy.  Patient will follow-up with me again in 4 weeks to discuss treatments again.  Spent  25 minutes with patient face-to-face and had greater than 50% of counseling including as described above in assessment and plan.

## 2018-10-13 NOTE — Progress Notes (Signed)
[name] [address], [place] [address] Phone: [phone] Subjective:    I Teresa Barnes am serving as a Education administrator for Dr. Hulan Saas.   CC: Right hip pain  SEG:[id number]  Teresa Barnes is a 72 y.o. female coming in with complaint of right hip pain. Hip is painful.  Patient has been seen previously.  Attempted a piriformis injection.  Do feel that patient does have more of a sciatic pain that is likely coming from the back.  Patient did not respond well to an epidural previously. MRI from 2018 was independently visualized by me showing moderate to severe facet degenerative changes at L5-S1 with L5 nerve root impingement.  Patient once again is adamant she wants to avoid any type of surgical intervention. Onset-  Location Duration-  Character- Aggravating factors- Reliving factors-  Therapies tried-  Severity-     Past Medical History:  Diagnosis Date  . Anemia   . Anemia of chronic disease 09/24/2018  . Blood transfusion without reported diagnosis 07/2016  . Cancer ([place]) 1991   breast  . Chronic gout 12/09/2017   On allopurinol  . Diabetes mellitus without complication ([place])   . Esophageal stricture 12/09/2017   S/p dilation by GI: Dr. Scarlette Shorts; due to h/o severe recurrent erosive esophagitis.  . 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 GERD (gastroesophageal reflux disease)   . Glaucoma   . Hiatal hernia 12/09/2017  . Hyperlipidemia   . Hypertension   . Right rotator cuff tear 11/24/2015   Injected this and 26 2017  . Wears glasses    Past Surgical History:  Procedure Laterality Date  . CATARACT EXTRACTION W/ INTRAOCULAR LENS  IMPLANT, BILATERAL  2000  . COLONOSCOPY    . cyst on spine    . ESOPHAGOGASTRODUODENOSCOPY (EGD) WITH PROPOFOL N/A 07/31/2016   Procedure: ESOPHAGOGASTRODUODENOSCOPY (EGD) WITH PROPOFOL;  Surgeon: Manus Gunning, MD;  Location: [facility];  Service:  Gastroenterology;  Laterality: N/A;  . EYE SURGERY  2011   lt eye blled  . MASTECTOMY MODIFIED RADICAL Right 1990  . REDUCTION MAMMAPLASTY Left   . TRIGGER FINGER RELEASE Right 06/24/2013   Procedure: RELEASE TRIGGER FINGER/A-1 PULLEY PIP RIGHT LONG FINGER;  Surgeon: Cammie Sickle., MD;  Location: [facility];  Service: Orthopedics;  Laterality: Right;  . TRIGGER FINGER RELEASE    . TUBAL LIGATION     Social History   Socioeconomic History  . Marital status: Married    Spouse name: robert  . Number of children: 2  . Years of education: Not on file  . Highest education level: Not on file  Occupational History  . Occupation: retired Educational psychologist; Wellsite geologist    Comment: 2006  Social Needs  . Financial resource strain: Not on file  . Food insecurity:    Worry: Not on file    Inability: Not on file  . Transportation needs:    Medical: Not on file    Non-medical: Not on file  Tobacco Use  . Smoking status: Former Smoker    Packs/day: 0.25    Years: 10.00    Pack years: 2.50    Types: Cigarettes    Last attempt to quit: 06/18/1993    Years since quitting: 25.3  . Smokeless tobacco: Never Used  Substance and Sexual Activity  . Alcohol use: No  . Drug use: No  . Sexual activity: Never  Lifestyle  .  Physical activity:    Days per week: Not on file    Minutes per session: Not on file  . Stress: Not on file  Relationships  . Social connections:    Talks on phone: Not on file    Gets together: Not on file    Attends religious service: Not on file    Active member of club or organization: Not on file    Attends meetings of clubs or organizations: Not on file    Relationship status: Not on file  Other Topics Concern  . Not on file  Social History Narrative   Originally from [place], [place]; worked in [employer] for [place]; longevity in family   Allergies  Allergen Reactions  . Prednisone Nausea And Vomiting  . Tramadol Nausea Only   Family History  Problem  Relation Age of Onset  . Diabetes Mother   . Diabetes Daughter   . Arthritis Daughter   . Hyperlipidemia Daughter   . Diabetes Son   . Arthritis Son   . Diabetes Sister   . Colon cancer Neg Hx   . Cancer Neg Hx   . Heart disease Neg Hx   . Breast cancer Neg Hx     Current Outpatient Medications (Endocrine & Metabolic):  .  metFORMIN (GLUCOPHAGE) 1000 MG tablet, TAKE 1 TABLET BY MOUTH TWICE DAILY .  VICTOZA 18 MG/3ML SOPN, Inject 0.2 mLs (1.2 mg total) into the skin daily.  Current Outpatient Medications (Cardiovascular):  .  lisinopril (PRINIVIL,ZESTRIL) 5 MG tablet, Take 1 tablet (5 mg total) by mouth daily. Marland Kitchen  lovastatin (MEVACOR) 40 MG tablet, Take 1 tablet (40 mg total) by mouth at bedtime.   Current Outpatient Medications (Analgesics):  .  allopurinol (ZYLOPRIM) 100 MG tablet, Take 1 tablet (100 mg total) by mouth daily.  Current Outpatient Medications (Hematological):  .  folic acid (FOLVITE) 1 MG tablet, Take 1 mg by mouth daily.  Current Outpatient Medications (Other):  .  bimatoprost (LUMIGAN) 0.01 % SOLN, Place 1 drop into both eyes nightly. 1 drop both eyes .  COMBIGAN 0.2-0.5 % ophthalmic solution,  .  gabapentin (NEURONTIN) 100 MG capsule, TAKE 2 CAPSULES BY MOUTH AT BEDTIME .  NOVOFINE PLUS 32G X 4 MM MISC, Check sugars once per day. Marland Kitchen  omeprazole (PRILOSEC) 40 MG capsule, TAKE 1 CAPSULE BY MOUTH ONCE DAILY .  SUPER B COMPLEX/C PO, Take by mouth.    Past medical history, social, surgical and family history all reviewed in electronic medical record.  No pertanent information unless stated regarding to the chief complaint.   Review of Systems:  No headache, visual changes, nausea, vomiting, diarrhea, constipation, dizziness, abdominal pain, skin rash, fevers, chills, night sweats, weight loss, swollen lymph nodes, body aches, joint swelling, chest pain, shortness of breath, mood changes.  Positive muscle aches  Objective  Blood pressure 100/60, pulse 62,  height 5\' 2"  (1.575 m), weight 180 lb (81.6 kg), SpO2 96 %.   General: No apparent distress alert and oriented x3 mood and affect normal, dressed appropriately.  HEENT: Pupils equal, extraocular movements intact  Respiratory: Patient's speak in full sentences and does not appear short of breath  Cardiovascular: No lower extremity edema, non tender, no erythema  Skin: Warm dry intact with no signs of infection or rash on extremities or on axial skeleton.  Abdomen: Soft nontender  Neuro: Cranial nerves II through XII are intact, neurovascularly intact in all extremities with 2+ DTRs and 2+ pulses.  Lymph: No lymphadenopathy of posterior  or anterior cervical chain or axillae bilaterally.  Gait antalgic gait walking with the aid of a cane MSK:  tender with full range of motion and good stability and symmetric strength and tone of shoulders, elbows, wrist, hip, knee and ankles bilaterally.  Moderate arthritic changes of multiple joints  Back exam still shows severe tenderness to palpation the paraspinal musculature lumbar spine.  Mild positive straight leg test at 25 degrees with radicular symptoms in the L5 and S1 distribution.  Mild limitation in all planes of the hip.  4 out of 5 strength of the leg compared to the contralateral side especially with plantarflexion and dorsiflexion.     Impression and Recommendations:     This case required medical decision making of moderate complexity. The above documentation has been reviewed and is accurate and complete Lyndal Pulley, DO       Note: This dictation was prepared with Dragon dictation along with smaller phrase technology. Any transcriptional errors that result from this process are unintentional.

## 2018-11-09 ENCOUNTER — Ambulatory Visit: Payer: Medicare Other | Admitting: Family Medicine

## 2018-12-15 ENCOUNTER — Ambulatory Visit: Payer: Medicare Other | Admitting: Podiatry

## 2018-12-15 DIAGNOSIS — B351 Tinea unguium: Secondary | ICD-10-CM

## 2018-12-15 DIAGNOSIS — E119 Type 2 diabetes mellitus without complications: Secondary | ICD-10-CM

## 2018-12-15 DIAGNOSIS — M79674 Pain in right toe(s): Secondary | ICD-10-CM | POA: Diagnosis not present

## 2018-12-15 DIAGNOSIS — M79675 Pain in left toe(s): Secondary | ICD-10-CM | POA: Diagnosis not present

## 2018-12-21 ENCOUNTER — Other Ambulatory Visit: Payer: Self-pay | Admitting: Family Medicine

## 2018-12-25 ENCOUNTER — Ambulatory Visit: Payer: Medicare Other | Admitting: Family Medicine

## 2018-12-25 ENCOUNTER — Encounter: Payer: Self-pay | Admitting: Podiatry

## 2018-12-25 NOTE — Progress Notes (Signed)
Subjective: Teresa Barnes presents today with painful, thick toenails 1-5 b/l that she cannot cut and which interfere with daily activities.  Pain is aggravated when wearing enclosed shoe gear.  Teresa Barnes voices no new concerns on today's visit.  She states she uses tea tree oil on her toenails.   Teresa Arnt, MD is her PCP. Last visit 09/24/2018.   Current Outpatient Medications:  .  allopurinol (ZYLOPRIM) 100 MG tablet, Take 1 tablet (100 mg total) by mouth daily., Disp: 90 tablet, Rfl: 3 .  bimatoprost (LUMIGAN) 0.01 % SOLN, Place 1 drop into both eyes nightly. 1 drop both eyes, Disp: , Rfl:  .  COMBIGAN 0.2-0.5 % ophthalmic solution, , Disp: , Rfl: 3 .  folic acid (FOLVITE) 1 MG tablet, Take 1 mg by mouth daily., Disp: , Rfl:  .  gabapentin (NEURONTIN) 100 MG capsule, TAKE 2 CAPSULES BY MOUTH AT BEDTIME, Disp: 180 capsule, Rfl: 0 .  lisinopril (PRINIVIL,ZESTRIL) 5 MG tablet, Take 1 tablet (5 mg total) by mouth daily., Disp: 90 tablet, Rfl: 3 .  lovastatin (MEVACOR) 40 MG tablet, Take 1 tablet (40 mg total) by mouth at bedtime., Disp: 90 tablet, Rfl: 3 .  metFORMIN (GLUCOPHAGE) 1000 MG tablet, TAKE 1 TABLET BY MOUTH TWICE DAILY, Disp: 180 tablet, Rfl: 1 .  NOVOFINE PLUS 32G X 4 MM MISC, Check sugars once per day., Disp: 100 each, Rfl: 2 .  omeprazole (PRILOSEC) 40 MG capsule, TAKE 1 CAPSULE BY MOUTH ONCE DAILY, Disp: 30 capsule, Rfl: 2 .  SUPER B COMPLEX/C PO, Take by mouth., Disp: , Rfl:  .  VICTOZA 18 MG/3ML SOPN, Inject 0.2 mLs (1.2 mg total) into the skin daily., Disp: 9 pen, Rfl: 3  Allergies  Allergen Reactions  . Prednisone Nausea And Vomiting  . Tramadol Nausea Only    Objective:  Vascular Examination: Capillary refill time <3 seconds  x 10 digits  Dorsalis pedis and Posterior tibial pulses are 1/4 b/l  Digital hair present x 10 digits  Skin temperature gradient WNL b/l  No edema BLE  Dermatological Examination: Skin with normal turgor, texture and tone  b/l  Toenails 1-5 b/l discolored, thick, dystrophic with subungual debris and pain with palpation to nailbeds due to thickness of nails. Incurvated nailplate left hallux lateral border with tenderness to palpation. No erythema, no edema, no drainage.  Musculoskeletal: Muscle strength 5/5 to all LE muscle groups  No gross bony deformities b/l.  No pain, crepitus or joint limitation noted with ROM.   Neurological: Sensation intact with 10 gram monofilament. Vibratory sensation diminished b/l.  Assessment: Painful onychomycosis toenails 1-5 b/l   Plan: 1. Toenails 1-5 b/l were debrided in length and girth without iatrogenic bleeding. Offending nail plate left hallux debrided and curretaged. Border cleansed with alcohol. Triple antibiotic ointment applied. Patient instructed to apply antiobiotic ointment to toe once daily for one week. Call the office if any problems arise. 2. Patient to continue soft, supportive shoe gear 3. Patient to report any pedal injuries to medical professional immediately. 4. Follow up 3 months.  5. Patient/POA to call should there be a concern in the interim.

## 2018-12-28 ENCOUNTER — Other Ambulatory Visit: Payer: Self-pay

## 2018-12-28 ENCOUNTER — Ambulatory Visit (INDEPENDENT_AMBULATORY_CARE_PROVIDER_SITE_OTHER): Payer: Medicare Other | Admitting: Family Medicine

## 2018-12-28 ENCOUNTER — Encounter: Payer: Self-pay | Admitting: Family Medicine

## 2018-12-28 VITALS — BP 122/70 | HR 64 | Temp 98.2°F | Resp 16 | Ht 62.0 in | Wt 181.6 lb

## 2018-12-28 DIAGNOSIS — M5416 Radiculopathy, lumbar region: Secondary | ICD-10-CM | POA: Diagnosis not present

## 2018-12-28 DIAGNOSIS — I10 Essential (primary) hypertension: Secondary | ICD-10-CM

## 2018-12-28 DIAGNOSIS — E1159 Type 2 diabetes mellitus with other circulatory complications: Secondary | ICD-10-CM | POA: Diagnosis not present

## 2018-12-28 DIAGNOSIS — E119 Type 2 diabetes mellitus without complications: Secondary | ICD-10-CM

## 2018-12-28 DIAGNOSIS — I152 Hypertension secondary to endocrine disorders: Secondary | ICD-10-CM

## 2018-12-28 LAB — POCT GLYCOSYLATED HEMOGLOBIN (HGB A1C): Hemoglobin A1C: 6.2 % — AB (ref 4.0–5.6)

## 2018-12-28 MED ORDER — VICTOZA 18 MG/3ML ~~LOC~~ SOPN: 1.8000 mg | PEN_INJECTOR | Freq: Every day | SUBCUTANEOUS | 3 refills | Status: DC

## 2018-12-28 MED ORDER — GABAPENTIN 100 MG PO CAPS: 300.0000 mg | ORAL_CAPSULE | Freq: Every day | ORAL | 0 refills | Status: DC

## 2018-12-28 NOTE — Progress Notes (Signed)
Subjective  CC:  Chief Complaint  Patient presents with  . Diabetes  . Hypertension    HPI: Teresa Barnes is a 73 y.o. female who presents to the office today for follow up of diabetes and problems listed above in the chief complaint.   Diabetes follow up: Her diabetic control is reported as Unchanged.  It has been very well controlled and at last visit we were going to decrease her Victoza dosing.  However, she has been struggling with lumbar radiculopathy and has had several steroid burst and epidural steroid injection.  She feared hyperglycemia so never went down on her Victoza dosing.  She feels her diabetes is doing fine. She denies exertional CP or SOB or symptomatic hypoglycemia. She denies foot sores or paresthesias.  Eye exam is up-to-date.  She denies symptoms of peripheral neuropathy  Hypertension has been well controlled on medications.  She denies symptoms of low blood pressures.  Lumbar radiculopathy managed by sports medicine.  Unfortunately not improving.  Discussed options of care.  She is taking low-dose gabapentin. Wt Readings from Last 3 Encounters:  12/28/18 181 lb 9.6 oz (82.4 kg)  10/13/18 180 lb (81.6 kg)  09/24/18 176 lb 14.4 oz (80.2 kg)    BP Readings from Last 3 Encounters:  12/28/18 122/70  10/13/18 100/60  09/24/18 122/76    Assessment  1. Controlled type 2 diabetes mellitus without complication, without long-term current use of insulin ([place])   2. Hypertension associated with diabetes ([place])   3. Right lumbar radiculopathy      Plan   Diabetes is currently very well controlled.  Continue current medications although she could decrease her Victoza as we discussed.  She will monitor her sugars and decide.  Immunizations are up-to-date.  Eye exam is next week.  She will be due for blood work in 3 months  Hypertension is well controlled.  Continue current medications.  Lumbar radiculopathy: Active, try increasing the gabapentin to 300 mg nightly  then up from there.  Patient declines other oral medications.  Follow up: Return in about 8 weeks (around 02/22/2019) for complete physical.. Orders Placed This Encounter  Procedures  . POCT glycosylated hemoglobin (Hb A1C)   Meds ordered this encounter  Medications  . VICTOZA 18 MG/3ML SOPN    Sig: Inject 0.3 mLs (1.8 mg total) into the skin daily.    Dispense:  9 pen    Refill:  3  . gabapentin (NEURONTIN) 100 MG capsule    Sig: Take 3 capsules (300 mg total) by mouth at bedtime.    Dispense:  180 capsule    Refill:  0      Immunization History  Administered Date(s) Administered  . Influenza, High Dose Seasonal PF 08/18/2017, 07/15/2018  . Influenza,inj,Quad PF,6+ Mos 08/01/2016  . Influenza-Unspecified 08/18/2014  . Pneumococcal Conjugate-13 12/09/2017  . Pneumococcal Polysaccharide-23 09/24/2018  . Zoster Recombinat (Shingrix) 10/21/2018    Diabetes Related Lab Review: Lab Results  Component Value Date   HGBA1C 6.2 (A) 12/28/2018   HGBA1C 6.2 (A) 09/24/2018   HGBA1C 6.3 (A) 06/22/2018    Lab Results  Component Value Date   MICROALBUR 8.1 (H) 03/09/2018   Lab Results  Component Value Date   CREATININE 1.20 03/09/2018   BUN 29 (H) 03/09/2018   NA 138 03/09/2018   K 4.6 03/09/2018   CL 100 03/09/2018   CO2 22 03/09/2018   Lab Results  Component Value Date   CHOL 175 03/09/2018   CHOL 203 (  H) 12/09/2017   Lab Results  Component Value Date   HDL 64.30 03/09/2018   HDL 57.60 12/09/2017   Lab Results  Component Value Date   LDLCALC 95 03/09/2018   LDLCALC 125 (H) 12/09/2017   Lab Results  Component Value Date   TRIG 82.0 03/09/2018   TRIG 103.0 12/09/2017   Lab Results  Component Value Date   CHOLHDL 3 03/09/2018   CHOLHDL 4 12/09/2017   No results found for: LDLDIRECT The 10-year ASCVD risk score Mikey Bussing DC Jr., et al., 2013) is: 27.8%   Values used to calculate the score:     Age: 32 years     Sex: Female     Is Non-Hispanic African American:  Yes     Diabetic: Yes     Tobacco smoker: No     Systolic Blood Pressure: 952 mmHg     Is BP treated: Yes     HDL Cholesterol: 64.3 mg/dL     Total Cholesterol: 175 mg/dL I have reviewed the PMH, Fam and Soc history. Patient Active Problem List   Diagnosis Date Noted  . Mixed hyperlipidemia 12/09/2017    Priority: High  . Hypertension associated with diabetes ([place]) 12/09/2017    Priority: High  . H/O: upper GI bleed 07/30/2016    Priority: High    Due to severe erosive esophagitis; 2017; healed with PPI by egd f/u; has [place] and stricture; s/p blood transfusion. Negative for Barretts esophagitis   . Controlled type 2 diabetes mellitus without complication, without long-term current use of insulin ([place]) 04/03/2009    Priority: High    dxd 1990; well controlled    . Anemia of chronic disease 09/24/2018    Priority: Medium  . History of right breast cancer 12/09/2017    Priority: Medium    S/p mastectomy 1990   . Hiatal hernia 12/09/2017    Priority: Medium  . Esophageal stricture 12/09/2017    Priority: Medium    S/p dilation by GI: Dr. Scarlette Shorts; due to h/o severe recurrent erosive esophagitis.   . Chronic gout 12/09/2017    Priority: Medium    On allopurinol   . Glaucoma 12/09/2017    Priority: Medium  . Degenerative arthritis of right knee 07/03/2017    Priority: Medium    Injected 07/03/2017' Injected August 05, 2018   . Right lumbar radiculopathy 01/23/2016    Priority: Medium  . Arthritis of right acromioclavicular joint 01/02/2016    Priority: Medium    Injected 01/02/2016 Repeat injection 03/12/2016   . Greater trochanteric bursitis of right hip 11/17/2017    Priority: Low    Injected November 17, 2017 Repeat injection May 05, 2018   . Normochromic anemia     Priority: Low  . Piriformis syndrome of right side 09/10/2018  . Adhesive bursitis of left shoulder 08/05/2018    Injected August 05, 2018     Social History: Patient  reports that  she quit smoking about 25 years ago. Her smoking use included cigarettes. She has a 2.50 pack-year smoking history. She has never used smokeless tobacco. She reports that she does not drink alcohol or use drugs.  Review of Systems: Ophthalmic: negative for eye pain, loss of vision or double vision Cardiovascular: negative for chest pain Respiratory: negative for SOB or persistent cough Gastrointestinal: negative for abdominal pain Genitourinary: negative for dysuria or gross hematuria MSK: negative for foot lesions Neurologic: negative for weakness or gait disturbance  Objective  Vitals: BP 122/70  Pulse 64   Temp 98.2 F (36.8 C) (Oral)   Resp 16   Ht 5\' 2"  (1.575 m)   Wt 181 lb 9.6 oz (82.4 kg)   SpO2 98%   BMI 33.22 kg/m  General: well appearing, no acute distress  Psych:  Alert and oriented, normal mood and affect HEENT:  Normocephalic, atraumatic, moist mucous membranes, supple neck  Cardiovascular:  Nl S1 and S2, RRR without murmur, gallop or rub. no edema Respiratory:  Good breath sounds bilaterally, CTAB with normal effort, no rales Gastrointestinal: normal BS, soft, nontender Skin:  Warm, no rashes     Diabetic education: ongoing education regarding chronic disease management for diabetes was given today. We continue to reinforce the ABC's of diabetic management: A1c (<7 or 8 dependent upon patient), tight blood pressure control, and cholesterol management with goal LDL < 100 minimally. We discuss diet strategies, exercise recommendations, medication options and possible side effects. At each visit, we review recommended immunizations and preventive care recommendations for diabetics and stress that good diabetic control can prevent other problems. See below for this patient's data.    Commons side effects, risks, benefits, and alternatives for medications and treatment plan prescribed today were discussed, and the patient expressed understanding of the given  instructions. Patient is instructed to call or message via MyChart if he/she has any questions or concerns regarding our treatment plan. No barriers to understanding were identified. We discussed Red Flag symptoms and signs in detail. Patient expressed understanding regarding what to do in case of urgent or emergency type symptoms.   Medication list was reconciled, printed and provided to the patient in AVS. Patient instructions and summary information was reviewed with the patient as documented in the AVS. This note was prepared with assistance of Dragon voice recognition software. Occasional wrong-word or sound-a-like substitutions may have occurred due to the inherent limitations of voice recognition software

## 2018-12-28 NOTE — Patient Instructions (Addendum)
Please return in April 2020 for your annual complete physical; please come fasting.  Your diabetes continues to do well! Try increasing your gabapentin to 300mg  (3 tabs) nightly for your back pain.  Feel better.   If you have any questions or concerns, please don't hesitate to send me a message via MyChart or call the office at [phone]. Thank you for visiting with [place] today! It's our pleasure caring for you.

## 2018-12-31 ENCOUNTER — Other Ambulatory Visit: Payer: Self-pay | Admitting: Family Medicine

## 2019-01-04 DIAGNOSIS — H40003 Preglaucoma, unspecified, bilateral: Secondary | ICD-10-CM | POA: Diagnosis not present

## 2019-01-04 DIAGNOSIS — E113593 Type 2 diabetes mellitus with proliferative diabetic retinopathy without macular edema, bilateral: Secondary | ICD-10-CM | POA: Diagnosis not present

## 2019-01-14 ENCOUNTER — Telehealth: Payer: Self-pay | Admitting: *Deleted

## 2019-01-14 MED ORDER — DICLOFENAC SODIUM 2 % TD SOLN: TRANSDERMAL | 3 refills | Status: DC

## 2019-01-14 NOTE — Telephone Encounter (Signed)
lmovm for pt to return call.  

## 2019-01-14 NOTE — Telephone Encounter (Signed)
Copied from [place] [phone]. Topic: General - Other >> Jan 11, 2019 10:53 AM Windy Kalata wrote: Reason for CRM: Pt states she was given Pennsaid samples and it is working for her, she would like to know if she can get a prescription called in for it?  [place], [place] - [address] [phone] (Phone) [phone] (Fax)    Call back is [phone] >> Jan 13, 2019 11:56 AM Scherrie Gerlach wrote: Pt states she is still waiting on this Rx for Pennsaid. Pt states if she cannot get Rx ,could she get more samples. Pt states she is using on her sciatica and it helps

## 2019-01-14 NOTE — Telephone Encounter (Signed)
Spoke to pt, advised her we will send rx to pharmacy but her insurance will probably not cover it. If it doesn't then we can giver her more samples.  Pt understood.

## 2019-01-20 ENCOUNTER — Other Ambulatory Visit: Payer: Self-pay | Admitting: Family Medicine

## 2019-01-20 DIAGNOSIS — Z1231 Encounter for screening mammogram for malignant neoplasm of breast: Secondary | ICD-10-CM

## 2019-01-21 ENCOUNTER — Encounter: Payer: Self-pay | Admitting: Family Medicine

## 2019-01-21 ENCOUNTER — Ambulatory Visit: Payer: Medicare Other | Admitting: Family Medicine

## 2019-01-21 ENCOUNTER — Ambulatory Visit: Payer: Self-pay

## 2019-01-21 VITALS — BP 128/58 | HR 54 | Ht 62.0 in | Wt 184.0 lb

## 2019-01-21 DIAGNOSIS — M79641 Pain in right hand: Secondary | ICD-10-CM

## 2019-01-21 DIAGNOSIS — M5416 Radiculopathy, lumbar region: Secondary | ICD-10-CM | POA: Diagnosis not present

## 2019-01-21 DIAGNOSIS — M1A00X0 Idiopathic chronic gout, unspecified site, without tophus (tophi): Secondary | ICD-10-CM | POA: Diagnosis not present

## 2019-01-21 MED ORDER — DOXYCYCLINE HYCLATE 100 MG PO TABS: 100.0000 mg | ORAL_TABLET | Freq: Two times a day (BID) | ORAL | 0 refills | Status: AC

## 2019-01-21 MED ORDER — MELOXICAM 7.5 MG PO TABS: 7.5000 mg | ORAL_TABLET | Freq: Every day | ORAL | 1 refills | Status: DC

## 2019-01-21 NOTE — Assessment & Plan Note (Addendum)
Patient does have chronic gout.  Seems to be in the fifth finger.  Discussed with patient in great length.  Concern for type of injections will also cover with doxycycline.  3-day course of colchicine but warned cannot do long time but if not completely resolved given meloxicam at a lower dose.  We discussed prednisone which patient declined.  Patient is also a diabetic.  Patient will be following up with me again in 1 to 2 weeks.  Continue the allopurinol and may need to increase from 100 mg to 300 mg at follow-up

## 2019-01-21 NOTE — Assessment & Plan Note (Signed)
Patient is having increasing weakness noted of the right lower extremity.  Discussed with patient again about the possibility of surgical intervention or injections.  Patient wants to avoid those when possible.

## 2019-01-21 NOTE — Patient Instructions (Addendum)
Good to see you  Doxycycline 2 times a day for 1 week Colchicine 2 times a day for 3 days  IF not better after the 3 days then start the meloxicam daily for 5 days- gave you more then that  See me again 2-3 weeks

## 2019-01-21 NOTE — Progress Notes (Signed)
[name] [place] [place], [place] [address] Phone: [phone] Subjective:    I'm seeing this patient by the request  of:    CC: Right fifth finger pain  [id number]    Update 01/21/2019: Teresa Barnes is a 73 y.o. female coming in with complaint of back and leg pain. She said that her leg is still tingling since the last visit. Antalgic gait.  Patient states that this is not the reason why she is here though.  Woke up today with pain in her 5th finger on the right hand. Pain is 10/10 today. Finger is swollen and red with no abrasions, scrapes, lacerations. Light pressure increases pain.       Past Medical History:  Diagnosis Date  . Anemia   . Anemia of chronic disease 09/24/2018  . Blood transfusion without reported diagnosis 07/2016  . Cancer ([place]) 1991   breast  . Chronic gout 12/09/2017   On allopurinol  . Diabetes mellitus without complication ([place])   . Esophageal stricture 12/09/2017   S/p dilation by GI: Dr. Scarlette Shorts; due to h/o severe recurrent erosive esophagitis.  . 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 GERD (gastroesophageal reflux disease)   . Glaucoma   . Hiatal hernia 12/09/2017  . Hyperlipidemia   . Hypertension   . Right rotator cuff tear 11/24/2015   Injected this and 26 2017  . Wears glasses    Past Surgical History:  Procedure Laterality Date  . CATARACT EXTRACTION W/ INTRAOCULAR LENS  IMPLANT, BILATERAL  2000  . COLONOSCOPY    . cyst on spine    . ESOPHAGOGASTRODUODENOSCOPY (EGD) WITH PROPOFOL N/A 07/31/2016   Procedure: ESOPHAGOGASTRODUODENOSCOPY (EGD) WITH PROPOFOL;  Surgeon: Manus Gunning, MD;  Location: [facility];  Service: Gastroenterology;  Laterality: N/A;  . EYE SURGERY  2011   lt eye blled  . MASTECTOMY MODIFIED RADICAL Right 1990  . REDUCTION MAMMAPLASTY Left   . TRIGGER FINGER RELEASE Right 06/24/2013   Procedure: RELEASE TRIGGER FINGER/A-1 PULLEY PIP  RIGHT LONG FINGER;  Surgeon: Cammie Sickle., MD;  Location: [facility];  Service: Orthopedics;  Laterality: Right;  . TRIGGER FINGER RELEASE    . TUBAL LIGATION     Social History   Socioeconomic History  . Marital status: Married    Spouse name: robert  . Number of children: 2  . Years of education: Not on file  . Highest education level: Not on file  Occupational History  . Occupation: retired Educational psychologist; [employer]    Comment: 2006  Social Needs  . Financial resource strain: Not on file  . Food insecurity:    Worry: Not on file    Inability: Not on file  . Transportation needs:    Medical: Not on file    Non-medical: Not on file  Tobacco Use  . Smoking status: Former Smoker    Packs/day: 0.25    Years: 10.00    Pack years: 2.50    Types: Cigarettes    Last attempt to quit: 06/18/1993    Years since quitting: 25.6  . Smokeless tobacco: Never Used  Substance and Sexual Activity  . Alcohol use: No  . Drug use: No  . Sexual activity: Never  Lifestyle  . Physical activity:    Days per week: Not on file    Minutes per session: Not on file  . Stress: Not on file  Relationships  .  Social connections:    Talks on phone: Not on file    Gets together: Not on file    Attends religious service: Not on file    Active member of club or organization: Not on file    Attends meetings of clubs or organizations: Not on file    Relationship status: Not on file  Other Topics Concern  . Not on file  Social History Narrative   Originally from [place], [place]; worked in [employer] for [place]; longevity in family   Allergies  Allergen Reactions  . Prednisone Nausea And Vomiting  . Tramadol Nausea Only   Family History  Problem Relation Age of Onset  . Diabetes Mother   . Diabetes Daughter   . Arthritis Daughter   . Hyperlipidemia Daughter   . Diabetes Son   . Arthritis Son   . Diabetes Sister   . Colon cancer Neg Hx   . Cancer Neg Hx   . Heart disease Neg  Hx   . Breast cancer Neg Hx     Current Outpatient Medications (Endocrine & Metabolic):  .  metFORMIN (GLUCOPHAGE) 1000 MG tablet, TAKE 1 TABLET BY MOUTH TWICE DAILY .  VICTOZA 18 MG/3ML SOPN, Inject 0.3 mLs (1.8 mg total) into the skin daily.  Current Outpatient Medications (Cardiovascular):  .  lisinopril (PRINIVIL,ZESTRIL) 5 MG tablet, TAKE 1 TABLET BY MOUTH ONCE DAILY .  lovastatin (MEVACOR) 40 MG tablet, Take 1 tablet (40 mg total) by mouth at bedtime.   Current Outpatient Medications (Analgesics):  .  allopurinol (ZYLOPRIM) 100 MG tablet, Take 1 tablet (100 mg total) by mouth daily. .  meloxicam (MOBIC) 7.5 MG tablet, Take 1 tablet (7.5 mg total) by mouth daily.  Current Outpatient Medications (Hematological):  .  folic acid (FOLVITE) 1 MG tablet, Take 1 mg by mouth daily.  Current Outpatient Medications (Other):  .  bimatoprost (LUMIGAN) 0.01 % SOLN, Place 1 drop into both eyes nightly. 1 drop both eyes .  COMBIGAN 0.2-0.5 % ophthalmic solution,  .  Diclofenac Sodium (PENNSAID) 2 % SOLN, Apply 1 pump twice daily as needed. .  gabapentin (NEURONTIN) 100 MG capsule, Take 3 capsules (300 mg total) by mouth at bedtime. Marland Kitchen  NOVOFINE PLUS 32G X 4 MM MISC, Check sugars once per day. Marland Kitchen  omeprazole (PRILOSEC) 40 MG capsule, TAKE 1 CAPSULE BY MOUTH ONCE DAILY .  SUPER B COMPLEX/C PO, Take by mouth. .  doxycycline (VIBRA-TABS) 100 MG tablet, Take 1 tablet (100 mg total) by mouth 2 (two) times daily for 7 days.    Past medical history, social, surgical and family history all reviewed in electronic medical record.  No pertanent information unless stated regarding to the chief complaint.   Review of Systems:  No headache, visual changes, nausea, vomiting, diarrhea, constipation, dizziness, abdominal pain, skin rash, fevers, chills, night sweats, weight loss, swollen lymph nodes, body aches, joint swelling,  chest pain, shortness of breath, mood changes.  Positive muscle aches  Objective    Blood pressure (!) 128/58, pulse (!) 54, height 5\' 2"  (1.575 m), weight 184 lb (83.5 kg), SpO2 99 %.    General: No apparent distress alert and oriented x3 mood and affect normal, dressed appropriately.  HEENT: Pupils equal, extraocular movements intact  Respiratory: Patient's speak in full sentences and does not appear short of breath  Cardiovascular: No lower extremity edema, non tender, no erythema  Skin: Warm dry intact with no signs of infection or rash on extremities or on axial skeleton.  Abdomen: Soft nontender  Neuro: Cranial nerves II through XII are intact, neurovascularly intact in all extremities with 2+ DTRs and 2+ pulses.  Lymph: No lymphadenopathy of posterior or anterior cervical chain or axillae bilaterally.  Gait severely antalgic MSK:  tender with limited range of motion and stability and symmetric strength and tone of shoulders, elbows, significant weakness noted of the right lower extremity Right hand exam shows the patient's fifth finger significantly swollen red and warm to touch.  No sign of any type of breakdown of the skin though.  Severely tender to even light palpation.   Impression and Recommendations:     This case required medical decision making of moderate complexity. The above documentation has been reviewed and is accurate and complete Lyndal Pulley, DO       Note: This dictation was prepared with Dragon dictation along with smaller phrase technology. Any transcriptional errors that result from this process are unintentional.

## 2019-02-10 ENCOUNTER — Ambulatory Visit: Payer: Medicare Other | Admitting: Family Medicine

## 2019-02-16 ENCOUNTER — Other Ambulatory Visit: Payer: Self-pay

## 2019-02-16 ENCOUNTER — Encounter: Payer: Self-pay | Admitting: Podiatry

## 2019-02-16 ENCOUNTER — Ambulatory Visit: Payer: Medicare Other | Admitting: Podiatry

## 2019-02-16 VITALS — Temp 98.0°F

## 2019-02-16 DIAGNOSIS — E119 Type 2 diabetes mellitus without complications: Secondary | ICD-10-CM | POA: Diagnosis not present

## 2019-02-16 DIAGNOSIS — M79674 Pain in right toe(s): Secondary | ICD-10-CM | POA: Diagnosis not present

## 2019-02-16 DIAGNOSIS — B351 Tinea unguium: Secondary | ICD-10-CM

## 2019-02-16 DIAGNOSIS — M79675 Pain in left toe(s): Secondary | ICD-10-CM

## 2019-02-16 MED ORDER — NONFORMULARY OR COMPOUNDED ITEM: 1.0000 g | Freq: Every day | 3 refills | Status: DC

## 2019-02-16 NOTE — Progress Notes (Signed)
Subjective: Patient presents today with diabetes for preventative care of painful, discolored, thick toenails which interfere with daily activities. Pain is aggravated when wearing enclosed shoe gear. Pain is getting progressively worse and relieved with periodic professional debridement.  Leamon Arnt, MD is her PCP and last visit was 12/28/2018.   Current Outpatient Medications:  .  allopurinol (ZYLOPRIM) 100 MG tablet, Take 1 tablet (100 mg total) by mouth daily., Disp: 90 tablet, Rfl: 3 .  bimatoprost (LUMIGAN) 0.01 % SOLN, Place 1 drop into both eyes nightly. 1 drop both eyes, Disp: , Rfl:  .  COMBIGAN 0.2-0.5 % ophthalmic solution, , Disp: , Rfl: 3 .  Diclofenac Sodium (PENNSAID) 2 % SOLN, Apply 1 pump twice daily as needed., Disp: 518 g, Rfl: 3 .  folic acid (FOLVITE) 1 MG tablet, Take 1 mg by mouth daily., Disp: , Rfl:  .  gabapentin (NEURONTIN) 100 MG capsule, Take 3 capsules (300 mg total) by mouth at bedtime., Disp: 180 capsule, Rfl: 0 .  lisinopril (PRINIVIL,ZESTRIL) 5 MG tablet, TAKE 1 TABLET BY MOUTH ONCE DAILY, Disp: 90 tablet, Rfl: 0 .  lovastatin (MEVACOR) 40 MG tablet, Take 1 tablet (40 mg total) by mouth at bedtime., Disp: 90 tablet, Rfl: 3 .  meloxicam (MOBIC) 7.5 MG tablet, Take 1 tablet (7.5 mg total) by mouth daily., Disp: 30 tablet, Rfl: 1 .  metFORMIN (GLUCOPHAGE) 1000 MG tablet, TAKE 1 TABLET BY MOUTH TWICE DAILY, Disp: 180 tablet, Rfl: 1 .  NONFORMULARY OR COMPOUNDED ITEM, Apply 1-2 g topically daily. [facility]  lacquer: Fluconazole 2%, Terbinafine 1%, DMSO, Disp: 120 each, Rfl: 3 .  NOVOFINE PLUS 32G X 4 MM MISC, Check sugars once per day., Disp: 100 each, Rfl: 2 .  omeprazole (PRILOSEC) 40 MG capsule, TAKE 1 CAPSULE BY MOUTH ONCE DAILY, Disp: 30 capsule, Rfl: 2 .  SUPER B COMPLEX/C PO, Take by mouth., Disp: , Rfl:  .  VICTOZA 18 MG/3ML SOPN, Inject 0.3 mLs (1.8 mg total) into the skin daily., Disp: 9 pen, Rfl: 3   Allergies  Allergen Reactions  .  Prednisone Nausea And Vomiting  . Tramadol Nausea Only     Objective:  Vascular Examination: Capillary refill time <3 seconds x 10 digits.  Dorsalis pedis pulses 1/4 b/l.  Posterior tibial pulses 1/4 b/l.  Digital hair present x 10 digits.  Skin temperature gradient WNL  b/l  Dermatological Examination: Skin with normal turgor, texture and tone b/l  Toenails 1-5 b/l discolored, thick, dystrophic with subungual debris and pain with palpation to nailbeds due to thickness of nails.  Musculoskeletal: Muscle strength 5/5 to all LE muscle groups  Neurological: Sensation intact with 10 gram monofilament.  Vibratory sensation diminished  Assessment: 1. Painful onychomycosis toenails 1-5 b/l 2. NIDDM  Plan: 1. Toenails 1-5 b/l were debrided in length and girth without iatrogenic bleeding. Offending nail border debrided and curretaged left great toe. Border cleansed with alcohol. Antibiotic ointment applied. No further treatment required by patient. Rx written for nonformulary compounding topical antifungal: [facility]: Antifungal cream - Terbinafine 3%, Fluconazole 2%, Tea Tree Oil 5%, Urea 10%, Ibuprofen 2% in DMSO Suspension #48ml. Apply to the affected nail(s) at bedtime. 2. Patient to continue soft, supportive shoe gear daily. 3. Patient to report any pedal injuries to medical professional immediately. 4. Follow up 3 months. 5. Patient/POA to call should there be a concern in the interim.

## 2019-02-19 ENCOUNTER — Ambulatory Visit: Payer: Medicare Other

## 2019-02-25 ENCOUNTER — Other Ambulatory Visit: Payer: Self-pay | Admitting: Family Medicine

## 2019-03-02 DIAGNOSIS — M545 Low back pain: Secondary | ICD-10-CM | POA: Diagnosis not present

## 2019-03-09 ENCOUNTER — Other Ambulatory Visit: Payer: Self-pay | Admitting: Neurological Surgery

## 2019-03-09 DIAGNOSIS — M545 Low back pain, unspecified: Secondary | ICD-10-CM

## 2019-03-10 ENCOUNTER — Other Ambulatory Visit: Payer: Self-pay

## 2019-03-10 ENCOUNTER — Ambulatory Visit
Admission: RE | Admit: 2019-03-10 | Discharge: 2019-03-10 | Disposition: A | Discharge status: Home / Self Care | Payer: Medicare Other | Source: Ambulatory Visit | Attending: Neurological Surgery | Admitting: Neurological Surgery

## 2019-03-10 DIAGNOSIS — M48061 Spinal stenosis, lumbar region without neurogenic claudication: Secondary | ICD-10-CM | POA: Diagnosis not present

## 2019-03-10 DIAGNOSIS — M545 Low back pain, unspecified: Secondary | ICD-10-CM

## 2019-03-15 ENCOUNTER — Other Ambulatory Visit: Payer: Medicare Other

## 2019-03-18 DIAGNOSIS — S199XXA Unspecified injury of neck, initial encounter: Secondary | ICD-10-CM | POA: Diagnosis not present

## 2019-03-18 DIAGNOSIS — G959 Disease of spinal cord, unspecified: Secondary | ICD-10-CM | POA: Diagnosis not present

## 2019-03-18 DIAGNOSIS — M545 Low back pain: Secondary | ICD-10-CM | POA: Diagnosis not present

## 2019-03-19 ENCOUNTER — Other Ambulatory Visit: Payer: Self-pay | Admitting: Neurological Surgery

## 2019-03-23 NOTE — Pre-Procedure Instructions (Signed)
Teresa Barnes  03/23/2019      [place], [place] [address] [place] [address] Phone: [phone] Fax: [phone]    Your procedure is scheduled on Friday May 8th.  Report to [facility] Admitting Entrance "A" at 7:45 A.M.  Call this number if you have problems the morning of surgery:  [phone]  Call [phone] if you have any questions prior to your surgery date Monday-Friday 8am-4pm   Remember:  Do not eat or drink after midnight.     Take these medicines the morning of surgery with A SIP OF WATER  allopurinol (ZYLOPRIM) COMBIGAN 0.2-0.5 % ophthalmic solution omeprazole (PRILOSEC)  Propylene Glycol (SYSTANE BALANCE)  As of today,  STOP taking any Aspirin(unless otherwise instructed by your surgeon), Aleve, Naproxen, Ibuprofen, Motrin, Advil, Goody's, BC's, all herbal medications, fish oil, and all vitamins.   HOW TO MANAGE YOUR DIABETES BEFORE AND AFTER SURGERY  Why is it important to control my blood sugar before and after surgery? . Improving blood sugar levels before and after surgery helps healing and can limit problems. . A way of improving blood sugar control is eating a healthy diet by: o  Eating less sugar and carbohydrates o  Increasing activity/exercise o  Talking with your doctor about reaching your blood sugar goals . High blood sugars (greater than 180 mg/dL) can raise your risk of infections and slow your recovery, so you will need to focus on controlling your diabetes during the weeks before surgery. . Make sure that the doctor who takes care of your diabetes knows about your planned surgery including the date and location.  How do I manage my blood sugar before surgery? . Check your blood sugar at least 4 times a day, starting 2 days before surgery, to make sure that the level is not too high or low. o Check your blood sugar the morning of your surgery when you wake up and  every 2 hours until you get to the Short Stay unit. . If your blood sugar is less than 70 mg/dL, you will need to treat for low blood sugar: o Do not take insulin. o Treat a low blood sugar (less than 70 mg/dL) with  cup of clear juice (cranberry or apple), 4 glucose tablets, OR glucose gel. Recheck blood sugar in 15 minutes after treatment (to make sure it is greater than 70 mg/dL). If your blood sugar is not greater than 70 mg/dL on recheck, call [phone] o  for further instructions. . Report your blood sugar to the short stay nurse when you get to Short Stay.  . If you are admitted to the hospital after surgery: o Your blood sugar will be checked by the staff and you will probably be given insulin after surgery (instead of oral diabetes medicines) to make sure you have good blood sugar levels. o The goal for blood sugar control after surgery is 80-180 mg/dL.     WHAT DO I DO ABOUT MY DIABETES MEDICATION?   Marland Kitchen Do not take oral diabetes medicines (pills):metFORMIN (GLUCOPHAGE) or glipiZIDE (GLUCOTROL XL) the morning of surgery.  . THE NIGHT BEFORE SURGERY, DO NOT take evening dose of Victoza; DO NOT take evening dose of glipiZIDE (GLUCOTROL XL)     . THE MORNING OF SURGERY, DO NOT take Victoza     Do not wear jewelry, make-up or nail polish.  Do not wear lotions, powders, or perfumes, or deodorant.  Do not  shave 48 hours prior to surgery.    Do not bring valuables to the hospital.  [facility] is not responsible for any belongings or valuables.  If you are a smoker, DO NOT smoke 24 hours prior to surgery.  REMEMBER THAT YOU MUST HAVE SOMEONE TO TRANSPORT YOU HOME AFTER YOUR SURGERY, AND REMAIN WITH YOU FOR 24 HOURS IF YOU ARE DISCHARGED THE SAME DAY.    Contacts, dentures or bridgework may not be worn into surgery.  Leave your suitcase in the car.  After surgery it may be brought to your room.  For patients admitted to the hospital, discharge time will be determined by your  treatment team.  Patients discharged the day of surgery will not be allowed to drive home.  [facility]- Preparing For Surgery  Before surgery, you can play an important role. Because skin is not sterile, your skin needs to be as free of germs as possible. You can reduce the number of germs on your skin by washing with CHG (chlorahexidine gluconate) Soap before surgery.  CHG is an antiseptic cleaner which kills germs and bonds with the skin to continue killing germs even after washing.    Oral Hygiene is also important to reduce your risk of infection.  Remember - BRUSH YOUR TEETH THE MORNING OF SURGERY WITH YOUR REGULAR TOOTHPASTE  Please do not use if you have an allergy to CHG or antibacterial soaps. If your skin becomes reddened/irritated stop using the CHG.  Do not shave (including legs and underarms) for at least 48 hours prior to first CHG shower. It is OK to shave your face.  Please follow these instructions carefully.   1. Shower the NIGHT BEFORE SURGERY and the MORNING OF SURGERY with CHG.   2. If you chose to wash your hair, wash your hair first as usual with your normal shampoo.  3. After you shampoo, rinse your hair and body thoroughly to remove the shampoo.  4. Use CHG as you would any other liquid soap. You can apply CHG directly to the skin and wash gently with a scrungie or a clean washcloth.   5. Apply the CHG Soap to your body ONLY FROM THE NECK DOWN.  Do not use on open wounds or open sores. Avoid contact with your eyes, ears, mouth and genitals (private parts). Wash Face and genitals (private parts)  with your normal soap.  6. Wash thoroughly, paying special attention to the area where your surgery will be performed.  7. Thoroughly rinse your body with warm water from the neck down.  8. DO NOT shower/wash with your normal soap after using and rinsing off the CHG Soap.  9. Pat yourself dry with a CLEAN TOWEL.  10. Wear CLEAN PAJAMAS to bed the night before surgery,  wear comfortable clothes the morning of surgery  11. Place CLEAN SHEETS on your bed the night of your first shower and DO NOT SLEEP WITH PETS.    Day of Surgery: Shower as stated above. Do not apply any deodorants/lotions.  Please wear clean clothes to the hospital/surgery center.   Remember to brush your teeth WITH YOUR REGULAR TOOTHPASTE.   Please read over the following fact sheets that you were given.

## 2019-03-24 ENCOUNTER — Encounter (HOSPITAL_COMMUNITY): Payer: Self-pay

## 2019-03-24 ENCOUNTER — Encounter (HOSPITAL_COMMUNITY)
Admission: RE | Admit: 2019-03-24 | Discharge: 2019-03-24 | Disposition: A | Discharge status: Home / Self Care | Payer: Medicare Other | Source: Ambulatory Visit | Attending: Neurological Surgery | Admitting: Neurological Surgery

## 2019-03-24 ENCOUNTER — Other Ambulatory Visit: Payer: Self-pay

## 2019-03-24 DIAGNOSIS — Z853 Personal history of malignant neoplasm of breast: Secondary | ICD-10-CM | POA: Diagnosis not present

## 2019-03-24 DIAGNOSIS — M4712 Other spondylosis with myelopathy, cervical region: Secondary | ICD-10-CM | POA: Diagnosis not present

## 2019-03-24 DIAGNOSIS — G9589 Other specified diseases of spinal cord: Secondary | ICD-10-CM | POA: Diagnosis not present

## 2019-03-24 DIAGNOSIS — Z7984 Long term (current) use of oral hypoglycemic drugs: Secondary | ICD-10-CM | POA: Diagnosis not present

## 2019-03-24 DIAGNOSIS — M1A9XX0 Chronic gout, unspecified, without tophus (tophi): Secondary | ICD-10-CM | POA: Diagnosis not present

## 2019-03-24 DIAGNOSIS — Z87891 Personal history of nicotine dependence: Secondary | ICD-10-CM | POA: Diagnosis not present

## 2019-03-24 DIAGNOSIS — E782 Mixed hyperlipidemia: Secondary | ICD-10-CM | POA: Insufficient documentation

## 2019-03-24 DIAGNOSIS — K5900 Constipation, unspecified: Secondary | ICD-10-CM | POA: Diagnosis not present

## 2019-03-24 DIAGNOSIS — H409 Unspecified glaucoma: Secondary | ICD-10-CM | POA: Diagnosis not present

## 2019-03-24 DIAGNOSIS — Z9011 Acquired absence of right breast and nipple: Secondary | ICD-10-CM | POA: Diagnosis not present

## 2019-03-24 DIAGNOSIS — M5001 Cervical disc disorder with myelopathy,  high cervical region: Secondary | ICD-10-CM | POA: Diagnosis not present

## 2019-03-24 DIAGNOSIS — K219 Gastro-esophageal reflux disease without esophagitis: Secondary | ICD-10-CM | POA: Diagnosis not present

## 2019-03-24 DIAGNOSIS — Z8719 Personal history of other diseases of the digestive system: Secondary | ICD-10-CM | POA: Diagnosis not present

## 2019-03-24 DIAGNOSIS — I10 Essential (primary) hypertension: Secondary | ICD-10-CM | POA: Insufficient documentation

## 2019-03-24 DIAGNOSIS — K592 Neurogenic bowel, not elsewhere classified: Secondary | ICD-10-CM | POA: Diagnosis not present

## 2019-03-24 DIAGNOSIS — Z9842 Cataract extraction status, left eye: Secondary | ICD-10-CM | POA: Diagnosis not present

## 2019-03-24 DIAGNOSIS — M2578 Osteophyte, vertebrae: Secondary | ICD-10-CM | POA: Diagnosis not present

## 2019-03-24 DIAGNOSIS — E785 Hyperlipidemia, unspecified: Secondary | ICD-10-CM | POA: Diagnosis not present

## 2019-03-24 DIAGNOSIS — Z888 Allergy status to other drugs, medicaments and biological substances status: Secondary | ICD-10-CM | POA: Diagnosis not present

## 2019-03-24 DIAGNOSIS — Z885 Allergy status to narcotic agent status: Secondary | ICD-10-CM | POA: Diagnosis not present

## 2019-03-24 DIAGNOSIS — M4802 Spinal stenosis, cervical region: Secondary | ICD-10-CM

## 2019-03-24 DIAGNOSIS — E1142 Type 2 diabetes mellitus with diabetic polyneuropathy: Secondary | ICD-10-CM | POA: Diagnosis not present

## 2019-03-24 DIAGNOSIS — Z01818 Encounter for other preprocedural examination: Secondary | ICD-10-CM | POA: Insufficient documentation

## 2019-03-24 DIAGNOSIS — Z9841 Cataract extraction status, right eye: Secondary | ICD-10-CM | POA: Diagnosis not present

## 2019-03-24 DIAGNOSIS — Z961 Presence of intraocular lens: Secondary | ICD-10-CM | POA: Diagnosis not present

## 2019-03-24 DIAGNOSIS — D638 Anemia in other chronic diseases classified elsewhere: Secondary | ICD-10-CM | POA: Diagnosis not present

## 2019-03-24 HISTORY — DX: Anxiety disorder, unspecified: F41.9

## 2019-03-24 LAB — CBC WITH DIFFERENTIAL/PLATELET
Abs Immature Granulocytes: 0.01 10*3/uL (ref 0.00–0.07)
Basophils Absolute: 0.1 10*3/uL (ref 0.0–0.1)
Basophils Relative: 1 %
Eosinophils Absolute: 0.4 10*3/uL (ref 0.0–0.5)
Eosinophils Relative: 6 %
HCT: 33.8 % — ABNORMAL LOW (ref 36.0–46.0)
Hemoglobin: 10.7 g/dL — ABNORMAL LOW (ref 12.0–15.0)
Immature Granulocytes: 0 %
Lymphocytes Relative: 35 %
Lymphs Abs: 2.4 10*3/uL (ref 0.7–4.0)
MCH: 30 pg (ref 26.0–34.0)
MCHC: 31.7 g/dL (ref 30.0–36.0)
MCV: 94.7 fL (ref 80.0–100.0)
Monocytes Absolute: 0.5 10*3/uL (ref 0.1–1.0)
Monocytes Relative: 7 %
Neutro Abs: 3.4 10*3/uL (ref 1.7–7.7)
Neutrophils Relative %: 51 %
Platelets: 341 10*3/uL (ref 150–400)
RBC: 3.57 MIL/uL — ABNORMAL LOW (ref 3.87–5.11)
RDW: 13.6 % (ref 11.5–15.5)
WBC: 6.8 10*3/uL (ref 4.0–10.5)
nRBC: 0 % (ref 0.0–0.2)

## 2019-03-24 LAB — PROTIME-INR
INR: 1 (ref 0.8–1.2)
Prothrombin Time: 12.6 seconds (ref 11.4–15.2)

## 2019-03-24 LAB — TYPE AND SCREEN
ABO/RH(D): O POS
Antibody Screen: NEGATIVE

## 2019-03-24 LAB — BASIC METABOLIC PANEL
Anion gap: 12 (ref 5–15)
BUN: 24 mg/dL — ABNORMAL HIGH (ref 8–23)
CO2: 23 mmol/L (ref 22–32)
Calcium: 9.9 mg/dL (ref 8.9–10.3)
Chloride: 103 mmol/L (ref 98–111)
Creatinine, Ser: 1.35 mg/dL — ABNORMAL HIGH (ref 0.44–1.00)
GFR calc Af Amer: 45 mL/min — ABNORMAL LOW (ref >60)
GFR calc non Af Amer: 39 mL/min — ABNORMAL LOW (ref >60)
Glucose, Bld: 100 mg/dL — ABNORMAL HIGH (ref 70–99)
Potassium: 4.5 mmol/L (ref 3.5–5.1)
Sodium: 138 mmol/L (ref 135–145)

## 2019-03-24 LAB — HEMOGLOBIN A1C
Hgb A1c MFr Bld: 6.6 % — ABNORMAL HIGH (ref 4.8–5.6)
Mean Plasma Glucose: 142.72 mg/dL

## 2019-03-24 LAB — SURGICAL PCR SCREEN
MRSA, PCR: NEGATIVE
Staphylococcus aureus: NEGATIVE

## 2019-03-24 LAB — GLUCOSE, CAPILLARY: Glucose-Capillary: 95 mg/dL (ref 70–99)

## 2019-03-24 LAB — ABO/RH: ABO/RH(D): O POS

## 2019-03-24 MED ORDER — CHLORHEXIDINE GLUCONATE CLOTH 2 % EX PADS: 6.0000 | MEDICATED_PAD | Freq: Once | CUTANEOUS | Status: DC

## 2019-03-26 ENCOUNTER — Inpatient Hospital Stay (HOSPITAL_COMMUNITY)
Admission: RE | Admit: 2019-03-26 | Discharge: 2019-03-29 | DRG: 472 | Disposition: A | Discharge status: Rehabilitation Facility | Payer: Medicare Other | Attending: Neurological Surgery | Admitting: Neurological Surgery

## 2019-03-26 ENCOUNTER — Inpatient Hospital Stay (HOSPITAL_COMMUNITY)
Admission: RE | Disposition: A | Discharge status: Rehabilitation Facility | Payer: Self-pay | Source: Home / Self Care | Attending: Neurological Surgery

## 2019-03-26 ENCOUNTER — Inpatient Hospital Stay (HOSPITAL_COMMUNITY): Payer: Medicare Other | Admitting: Anesthesiology

## 2019-03-26 ENCOUNTER — Encounter (HOSPITAL_COMMUNITY): Payer: Self-pay | Admitting: Neurological Surgery

## 2019-03-26 ENCOUNTER — Other Ambulatory Visit: Payer: Self-pay

## 2019-03-26 ENCOUNTER — Inpatient Hospital Stay (HOSPITAL_COMMUNITY): Payer: Medicare Other | Admitting: Physician Assistant

## 2019-03-26 ENCOUNTER — Inpatient Hospital Stay (HOSPITAL_COMMUNITY): Payer: Medicare Other

## 2019-03-26 DIAGNOSIS — G959 Disease of spinal cord, unspecified: Secondary | ICD-10-CM | POA: Diagnosis not present

## 2019-03-26 DIAGNOSIS — M4712 Other spondylosis with myelopathy, cervical region: Secondary | ICD-10-CM | POA: Diagnosis not present

## 2019-03-26 DIAGNOSIS — E669 Obesity, unspecified: Secondary | ICD-10-CM | POA: Diagnosis not present

## 2019-03-26 DIAGNOSIS — E119 Type 2 diabetes mellitus without complications: Secondary | ICD-10-CM | POA: Diagnosis not present

## 2019-03-26 DIAGNOSIS — M4322 Fusion of spine, cervical region: Secondary | ICD-10-CM | POA: Diagnosis not present

## 2019-03-26 DIAGNOSIS — M1A9XX0 Chronic gout, unspecified, without tophus (tophi): Secondary | ICD-10-CM | POA: Diagnosis present

## 2019-03-26 DIAGNOSIS — H409 Unspecified glaucoma: Secondary | ICD-10-CM | POA: Diagnosis not present

## 2019-03-26 DIAGNOSIS — Z79899 Other long term (current) drug therapy: Secondary | ICD-10-CM

## 2019-03-26 DIAGNOSIS — Z853 Personal history of malignant neoplasm of breast: Secondary | ICD-10-CM | POA: Diagnosis not present

## 2019-03-26 DIAGNOSIS — Z4789 Encounter for other orthopedic aftercare: Secondary | ICD-10-CM | POA: Diagnosis not present

## 2019-03-26 DIAGNOSIS — D638 Anemia in other chronic diseases classified elsewhere: Secondary | ICD-10-CM | POA: Diagnosis present

## 2019-03-26 DIAGNOSIS — Z981 Arthrodesis status: Secondary | ICD-10-CM

## 2019-03-26 DIAGNOSIS — M4802 Spinal stenosis, cervical region: Principal | ICD-10-CM | POA: Diagnosis present

## 2019-03-26 DIAGNOSIS — K5900 Constipation, unspecified: Secondary | ICD-10-CM | POA: Diagnosis present

## 2019-03-26 DIAGNOSIS — Z885 Allergy status to narcotic agent status: Secondary | ICD-10-CM

## 2019-03-26 DIAGNOSIS — Z9842 Cataract extraction status, left eye: Secondary | ICD-10-CM

## 2019-03-26 DIAGNOSIS — E1142 Type 2 diabetes mellitus with diabetic polyneuropathy: Secondary | ICD-10-CM | POA: Diagnosis present

## 2019-03-26 DIAGNOSIS — M2578 Osteophyte, vertebrae: Secondary | ICD-10-CM | POA: Diagnosis present

## 2019-03-26 DIAGNOSIS — Z8719 Personal history of other diseases of the digestive system: Secondary | ICD-10-CM

## 2019-03-26 DIAGNOSIS — E785 Hyperlipidemia, unspecified: Secondary | ICD-10-CM | POA: Diagnosis not present

## 2019-03-26 DIAGNOSIS — K449 Diaphragmatic hernia without obstruction or gangrene: Secondary | ICD-10-CM | POA: Diagnosis not present

## 2019-03-26 DIAGNOSIS — M75101 Unspecified rotator cuff tear or rupture of right shoulder, not specified as traumatic: Secondary | ICD-10-CM | POA: Diagnosis not present

## 2019-03-26 DIAGNOSIS — K222 Esophageal obstruction: Secondary | ICD-10-CM | POA: Diagnosis not present

## 2019-03-26 DIAGNOSIS — Z7984 Long term (current) use of oral hypoglycemic drugs: Secondary | ICD-10-CM | POA: Diagnosis not present

## 2019-03-26 DIAGNOSIS — K592 Neurogenic bowel, not elsewhere classified: Secondary | ICD-10-CM | POA: Diagnosis not present

## 2019-03-26 DIAGNOSIS — M5001 Cervical disc disorder with myelopathy,  high cervical region: Secondary | ICD-10-CM | POA: Diagnosis present

## 2019-03-26 DIAGNOSIS — G9589 Other specified diseases of spinal cord: Secondary | ICD-10-CM | POA: Diagnosis not present

## 2019-03-26 DIAGNOSIS — M7501 Adhesive capsulitis of right shoulder: Secondary | ICD-10-CM | POA: Diagnosis not present

## 2019-03-26 DIAGNOSIS — K219 Gastro-esophageal reflux disease without esophagitis: Secondary | ICD-10-CM | POA: Diagnosis not present

## 2019-03-26 DIAGNOSIS — Z9841 Cataract extraction status, right eye: Secondary | ICD-10-CM | POA: Diagnosis not present

## 2019-03-26 DIAGNOSIS — Z961 Presence of intraocular lens: Secondary | ICD-10-CM | POA: Diagnosis present

## 2019-03-26 DIAGNOSIS — E1169 Type 2 diabetes mellitus with other specified complication: Secondary | ICD-10-CM | POA: Diagnosis not present

## 2019-03-26 DIAGNOSIS — W19XXXA Unspecified fall, initial encounter: Secondary | ICD-10-CM | POA: Diagnosis present

## 2019-03-26 DIAGNOSIS — E782 Mixed hyperlipidemia: Secondary | ICD-10-CM | POA: Diagnosis not present

## 2019-03-26 DIAGNOSIS — Z419 Encounter for procedure for purposes other than remedying health state, unspecified: Secondary | ICD-10-CM

## 2019-03-26 DIAGNOSIS — Z833 Family history of diabetes mellitus: Secondary | ICD-10-CM | POA: Diagnosis not present

## 2019-03-26 DIAGNOSIS — Z9011 Acquired absence of right breast and nipple: Secondary | ICD-10-CM | POA: Diagnosis not present

## 2019-03-26 DIAGNOSIS — R200 Anesthesia of skin: Secondary | ICD-10-CM | POA: Diagnosis present

## 2019-03-26 DIAGNOSIS — M109 Gout, unspecified: Secondary | ICD-10-CM | POA: Diagnosis not present

## 2019-03-26 DIAGNOSIS — Z87891 Personal history of nicotine dependence: Secondary | ICD-10-CM

## 2019-03-26 DIAGNOSIS — I10 Essential (primary) hypertension: Secondary | ICD-10-CM | POA: Diagnosis present

## 2019-03-26 DIAGNOSIS — Z888 Allergy status to other drugs, medicaments and biological substances status: Secondary | ICD-10-CM | POA: Diagnosis not present

## 2019-03-26 DIAGNOSIS — G992 Myelopathy in diseases classified elsewhere: Secondary | ICD-10-CM | POA: Diagnosis not present

## 2019-03-26 HISTORY — PX: ANTERIOR CERVICAL DECOMP/DISCECTOMY FUSION: SHX1161

## 2019-03-26 LAB — GLUCOSE, CAPILLARY
Glucose-Capillary: 130 mg/dL — ABNORMAL HIGH (ref 70–99)
Glucose-Capillary: 159 mg/dL — ABNORMAL HIGH (ref 70–99)
Glucose-Capillary: 202 mg/dL — ABNORMAL HIGH (ref 70–99)
Glucose-Capillary: 161 mg/dL — ABNORMAL HIGH (ref 70–99)

## 2019-03-26 SURGERY — ANTERIOR CERVICAL DECOMPRESSION/DISCECTOMY FUSION 3 LEVELS
Anesthesia: General

## 2019-03-26 MED ORDER — MENTHOL 3 MG MT LOZG
1.0000 | LOZENGE | OROMUCOSAL | Status: DC | PRN
  Filled 2019-03-26: qty 9

## 2019-03-26 MED ORDER — MEPERIDINE HCL 25 MG/ML IJ SOLN: 6.2500 mg | INTRAMUSCULAR | Status: DC | PRN

## 2019-03-26 MED ORDER — DEXAMETHASONE 4 MG PO TABS
4.0000 mg | ORAL_TABLET | Freq: Four times a day (QID) | ORAL | Status: DC
  Administered 2019-03-26 – 2019-03-29 (×6): 4 mg via ORAL
  Filled 2019-03-26 (×6): qty 1

## 2019-03-26 MED ORDER — SODIUM CHLORIDE 0.9 % IV SOLN: 250.0000 mL | INTRAVENOUS | Status: DC

## 2019-03-26 MED ORDER — SODIUM CHLORIDE 0.9% FLUSH: 3.0000 mL | INTRAVENOUS | Status: DC | PRN

## 2019-03-26 MED ORDER — LISINOPRIL 5 MG PO TABS
5.0000 mg | ORAL_TABLET | Freq: Every day | ORAL | Status: DC
  Administered 2019-03-26 – 2019-03-29 (×4): 5 mg via ORAL
  Filled 2019-03-26 (×4): qty 1

## 2019-03-26 MED ORDER — FENTANYL CITRATE (PF) 100 MCG/2ML IJ SOLN
25.0000 ug | INTRAMUSCULAR | Status: DC | PRN
  Administered 2019-03-26: 25 ug via INTRAVENOUS
  Administered 2019-03-26: 50 ug via INTRAVENOUS

## 2019-03-26 MED ORDER — OXYCODONE HCL 5 MG PO TABS
ORAL_TABLET | ORAL | Status: AC
  Filled 2019-03-26: qty 1

## 2019-03-26 MED ORDER — THROMBIN 5000 UNITS EX SOLR
OROMUCOSAL | Status: DC | PRN
  Administered 2019-03-26 (×2): via TOPICAL

## 2019-03-26 MED ORDER — ONDANSETRON HCL 4 MG/2ML IJ SOLN
INTRAMUSCULAR | Status: DC | PRN
  Administered 2019-03-26: 4 mg via INTRAVENOUS

## 2019-03-26 MED ORDER — ACETAMINOPHEN 325 MG PO TABS: 325.0000 mg | ORAL_TABLET | ORAL | Status: DC | PRN

## 2019-03-26 MED ORDER — ACETAMINOPHEN 325 MG PO TABS
650.0000 mg | ORAL_TABLET | ORAL | Status: DC | PRN
  Administered 2019-03-28: 650 mg via ORAL
  Filled 2019-03-26: qty 2

## 2019-03-26 MED ORDER — PHENOL 1.4 % MT LIQD
1.0000 | OROMUCOSAL | Status: DC | PRN
  Administered 2019-03-26: 1 via OROMUCOSAL
  Filled 2019-03-26: qty 177

## 2019-03-26 MED ORDER — ONDANSETRON HCL 4 MG/2ML IJ SOLN: 4.0000 mg | Freq: Once | INTRAMUSCULAR | Status: DC | PRN

## 2019-03-26 MED ORDER — SODIUM CHLORIDE 0.9 % IV SOLN
INTRAVENOUS | Status: DC | PRN
  Administered 2019-03-26: 11:00:00 50 ug/min via INTRAVENOUS

## 2019-03-26 MED ORDER — FENTANYL CITRATE (PF) 250 MCG/5ML IJ SOLN
INTRAMUSCULAR | Status: AC
  Filled 2019-03-26: qty 5

## 2019-03-26 MED ORDER — METHOCARBAMOL 1000 MG/10ML IJ SOLN
500.0000 mg | Freq: Four times a day (QID) | INTRAVENOUS | Status: DC | PRN
  Filled 2019-03-26: qty 5

## 2019-03-26 MED ORDER — PROPOFOL 10 MG/ML IV BOLUS
INTRAVENOUS | Status: AC
  Filled 2019-03-26: qty 20

## 2019-03-26 MED ORDER — OXYCODONE HCL 5 MG/5ML PO SOLN: 5.0000 mg | Freq: Once | ORAL | Status: AC | PRN

## 2019-03-26 MED ORDER — LIDOCAINE 2% (20 MG/ML) 5 ML SYRINGE
INTRAMUSCULAR | Status: DC | PRN
  Administered 2019-03-26: 80 mg via INTRAVENOUS

## 2019-03-26 MED ORDER — ONDANSETRON HCL 4 MG/2ML IJ SOLN
INTRAMUSCULAR | Status: AC
  Filled 2019-03-26: qty 2

## 2019-03-26 MED ORDER — DEXAMETHASONE SODIUM PHOSPHATE 10 MG/ML IJ SOLN
INTRAMUSCULAR | Status: DC | PRN
  Administered 2019-03-26: 10 mg via INTRAVENOUS

## 2019-03-26 MED ORDER — GLIPIZIDE ER 5 MG PO TB24
5.0000 mg | ORAL_TABLET | Freq: Every day | ORAL | Status: DC | PRN
  Administered 2019-03-26 – 2019-03-27 (×2): 5 mg via ORAL
  Filled 2019-03-26 (×3): qty 1

## 2019-03-26 MED ORDER — CEFAZOLIN SODIUM-DEXTROSE 2-4 GM/100ML-% IV SOLN
2.0000 g | INTRAVENOUS | Status: DC
  Filled 2019-03-26: qty 100

## 2019-03-26 MED ORDER — BUPIVACAINE HCL (PF) 0.25 % IJ SOLN
INTRAMUSCULAR | Status: AC
  Filled 2019-03-26: qty 30

## 2019-03-26 MED ORDER — FENTANYL CITRATE (PF) 100 MCG/2ML IJ SOLN
INTRAMUSCULAR | Status: AC
  Filled 2019-03-26: qty 2

## 2019-03-26 MED ORDER — PROPOFOL 10 MG/ML IV BOLUS
INTRAVENOUS | Status: DC | PRN
  Administered 2019-03-26: 140 mg via INTRAVENOUS

## 2019-03-26 MED ORDER — INSULIN ASPART 100 UNIT/ML ~~LOC~~ SOLN
0.0000 [IU] | Freq: Three times a day (TID) | SUBCUTANEOUS | Status: DC
  Administered 2019-03-26: 5 [IU] via SUBCUTANEOUS
  Administered 2019-03-27: 3 [IU] via SUBCUTANEOUS
  Administered 2019-03-27: 5 [IU] via SUBCUTANEOUS
  Administered 2019-03-27 – 2019-03-28 (×2): 2 [IU] via SUBCUTANEOUS
  Administered 2019-03-29: 5 [IU] via SUBCUTANEOUS
  Administered 2019-03-29: 2 [IU] via SUBCUTANEOUS

## 2019-03-26 MED ORDER — BRIMONIDINE TARTRATE-TIMOLOL 0.2-0.5 % OP SOLN: 1.0000 [drp] | Freq: Two times a day (BID) | OPHTHALMIC | Status: DC

## 2019-03-26 MED ORDER — 0.9 % SODIUM CHLORIDE (POUR BTL) OPTIME
TOPICAL | Status: DC | PRN
  Administered 2019-03-26: 1000 mL

## 2019-03-26 MED ORDER — ACETAMINOPHEN 160 MG/5ML PO SOLN: 325.0000 mg | ORAL | Status: DC | PRN

## 2019-03-26 MED ORDER — SODIUM CHLORIDE 0.9 % IV SOLN
INTRAVENOUS | Status: DC | PRN
  Administered 2019-03-26: 11:00:00

## 2019-03-26 MED ORDER — BRIMONIDINE TARTRATE 0.2 % OP SOLN
1.0000 [drp] | Freq: Two times a day (BID) | OPHTHALMIC | Status: DC
  Administered 2019-03-26 – 2019-03-29 (×4): 1 [drp] via OPHTHALMIC
  Filled 2019-03-26: qty 5

## 2019-03-26 MED ORDER — LATANOPROST 0.005 % OP SOLN
1.0000 [drp] | Freq: Every day | OPHTHALMIC | Status: DC
  Administered 2019-03-26 – 2019-03-28 (×3): 1 [drp] via OPHTHALMIC
  Filled 2019-03-26: qty 2.5

## 2019-03-26 MED ORDER — SENNA 8.6 MG PO TABS
1.0000 | ORAL_TABLET | Freq: Two times a day (BID) | ORAL | Status: DC
  Administered 2019-03-27 – 2019-03-28 (×3): 8.6 mg via ORAL
  Filled 2019-03-26 (×5): qty 1

## 2019-03-26 MED ORDER — OXYCODONE HCL 5 MG PO TABS
5.0000 mg | ORAL_TABLET | Freq: Once | ORAL | Status: AC | PRN
  Administered 2019-03-26: 5 mg via ORAL

## 2019-03-26 MED ORDER — DEXAMETHASONE SODIUM PHOSPHATE 4 MG/ML IJ SOLN
4.0000 mg | Freq: Four times a day (QID) | INTRAMUSCULAR | Status: DC
  Administered 2019-03-28 – 2019-03-29 (×6): 4 mg via INTRAVENOUS
  Filled 2019-03-26 (×6): qty 1

## 2019-03-26 MED ORDER — CEFAZOLIN SODIUM-DEXTROSE 2-3 GM-%(50ML) IV SOLR
INTRAVENOUS | Status: DC | PRN
  Administered 2019-03-26: 2 g via INTRAVENOUS

## 2019-03-26 MED ORDER — LACTATED RINGERS IV SOLN
INTRAVENOUS | Status: DC
  Administered 2019-03-26 (×2): via INTRAVENOUS

## 2019-03-26 MED ORDER — THROMBIN 20000 UNITS EX SOLR
CUTANEOUS | Status: DC | PRN
  Administered 2019-03-26: 11:00:00 via TOPICAL

## 2019-03-26 MED ORDER — ONDANSETRON HCL 4 MG PO TABS: 4.0000 mg | ORAL_TABLET | Freq: Four times a day (QID) | ORAL | Status: DC | PRN

## 2019-03-26 MED ORDER — BUPIVACAINE HCL (PF) 0.25 % IJ SOLN
INTRAMUSCULAR | Status: DC | PRN
  Administered 2019-03-26: 5 mL

## 2019-03-26 MED ORDER — SUCCINYLCHOLINE CHLORIDE 20 MG/ML IJ SOLN
INTRAMUSCULAR | Status: DC | PRN
  Administered 2019-03-26: 100 mg via INTRAVENOUS

## 2019-03-26 MED ORDER — SODIUM CHLORIDE 0.9% FLUSH
3.0000 mL | Freq: Two times a day (BID) | INTRAVENOUS | Status: DC
  Administered 2019-03-26 – 2019-03-29 (×5): 3 mL via INTRAVENOUS

## 2019-03-26 MED ORDER — ACETAMINOPHEN 650 MG RE SUPP: 650.0000 mg | RECTAL | Status: DC | PRN

## 2019-03-26 MED ORDER — POTASSIUM CHLORIDE IN NACL 20-0.9 MEQ/L-% IV SOLN
INTRAVENOUS | Status: DC
  Administered 2019-03-26 – 2019-03-28 (×4): via INTRAVENOUS
  Filled 2019-03-26 (×4): qty 1000

## 2019-03-26 MED ORDER — METHOCARBAMOL 500 MG PO TABS
500.0000 mg | ORAL_TABLET | Freq: Four times a day (QID) | ORAL | Status: DC | PRN
  Administered 2019-03-26 – 2019-03-29 (×7): 500 mg via ORAL
  Filled 2019-03-26 (×6): qty 1

## 2019-03-26 MED ORDER — CEFAZOLIN SODIUM-DEXTROSE 2-4 GM/100ML-% IV SOLN
2.0000 g | Freq: Three times a day (TID) | INTRAVENOUS | Status: AC
  Administered 2019-03-26 (×2): 2 g via INTRAVENOUS
  Filled 2019-03-26 (×2): qty 100

## 2019-03-26 MED ORDER — METHOCARBAMOL 500 MG PO TABS
ORAL_TABLET | ORAL | Status: AC
  Filled 2019-03-26: qty 1

## 2019-03-26 MED ORDER — SUGAMMADEX SODIUM 200 MG/2ML IV SOLN
INTRAVENOUS | Status: DC | PRN
  Administered 2019-03-26: 163.2 mg via INTRAVENOUS

## 2019-03-26 MED ORDER — FENTANYL CITRATE (PF) 250 MCG/5ML IJ SOLN
INTRAMUSCULAR | Status: DC | PRN
  Administered 2019-03-26: 50 ug via INTRAVENOUS
  Administered 2019-03-26: 100 ug via INTRAVENOUS

## 2019-03-26 MED ORDER — ROCURONIUM BROMIDE 10 MG/ML (PF) SYRINGE
PREFILLED_SYRINGE | INTRAVENOUS | Status: DC | PRN
  Administered 2019-03-26: 50 mg via INTRAVENOUS

## 2019-03-26 MED ORDER — FOLIC ACID 1 MG PO TABS
1.0000 mg | ORAL_TABLET | Freq: Every day | ORAL | Status: DC
  Administered 2019-03-26 – 2019-03-29 (×4): 1 mg via ORAL
  Filled 2019-03-26 (×4): qty 1

## 2019-03-26 MED ORDER — TIMOLOL MALEATE 0.5 % OP SOLN
1.0000 [drp] | Freq: Two times a day (BID) | OPHTHALMIC | Status: DC
  Administered 2019-03-27 – 2019-03-28 (×2): 1 [drp] via OPHTHALMIC
  Filled 2019-03-26: qty 5

## 2019-03-26 MED ORDER — THROMBIN 20000 UNITS EX SOLR
CUTANEOUS | Status: AC
  Filled 2019-03-26: qty 20000

## 2019-03-26 MED ORDER — OXYCODONE HCL 5 MG PO TABS
5.0000 mg | ORAL_TABLET | ORAL | Status: DC | PRN
  Administered 2019-03-27 – 2019-03-28 (×4): 5 mg via ORAL
  Filled 2019-03-26 (×4): qty 1

## 2019-03-26 MED ORDER — ALLOPURINOL 100 MG PO TABS
100.0000 mg | ORAL_TABLET | Freq: Every day | ORAL | Status: DC
  Administered 2019-03-26 – 2019-03-29 (×4): 100 mg via ORAL
  Filled 2019-03-26 (×4): qty 1

## 2019-03-26 MED ORDER — THROMBIN 5000 UNITS EX SOLR
CUTANEOUS | Status: AC
  Filled 2019-03-26: qty 5000

## 2019-03-26 MED ORDER — METFORMIN HCL 500 MG PO TABS
1000.0000 mg | ORAL_TABLET | Freq: Two times a day (BID) | ORAL | Status: DC
  Administered 2019-03-26 – 2019-03-29 (×7): 1000 mg via ORAL
  Filled 2019-03-26 (×7): qty 2

## 2019-03-26 MED ORDER — MORPHINE SULFATE (PF) 2 MG/ML IV SOLN
2.0000 mg | INTRAVENOUS | Status: DC | PRN
  Administered 2019-03-26 – 2019-03-29 (×4): 2 mg via INTRAVENOUS
  Filled 2019-03-26 (×5): qty 1

## 2019-03-26 MED ORDER — CEFAZOLIN SODIUM-DEXTROSE 2-4 GM/100ML-% IV SOLN
INTRAVENOUS | Status: AC
  Filled 2019-03-26: qty 100

## 2019-03-26 MED ORDER — ONDANSETRON HCL 4 MG/2ML IJ SOLN: 4.0000 mg | Freq: Four times a day (QID) | INTRAMUSCULAR | Status: DC | PRN

## 2019-03-26 SURGICAL SUPPLY — 56 items
BAG DECANTER FOR FLEXI CONT (MISCELLANEOUS) ×3 IMPLANT
BASKET BONE COLLECTION (BASKET) ×3 IMPLANT
BENZOIN TINCTURE PRP APPL 2/3 (GAUZE/BANDAGES/DRESSINGS) ×3 IMPLANT
BIT DRILL 14MM (INSTRUMENTS) ×1 IMPLANT
BUR MATCHSTICK NEURO 3.0 LAGG (BURR) ×3 IMPLANT
CANISTER SUCT 3000ML PPV (MISCELLANEOUS) ×3 IMPLANT
CARTRIDGE OIL MAESTRO DRILL (MISCELLANEOUS) ×1 IMPLANT
CLOSURE WOUND 1/2 X4 (GAUZE/BANDAGES/DRESSINGS) ×1
COVER WAND RF STERILE (DRAPES) IMPLANT
DIFFUSER DRILL AIR PNEUMATIC (MISCELLANEOUS) ×3 IMPLANT
DRAPE C-ARM 42X72 X-RAY (DRAPES) ×6 IMPLANT
DRAPE LAPAROTOMY 100X72 PEDS (DRAPES) ×3 IMPLANT
DRAPE MICROSCOPE LEICA (MISCELLANEOUS) ×3 IMPLANT
DRILL 14MM (INSTRUMENTS) ×3
DRSG OPSITE POSTOP 4X6 (GAUZE/BANDAGES/DRESSINGS) ×3 IMPLANT
DURAPREP 6ML APPLICATOR 50/CS (WOUND CARE) ×3 IMPLANT
ELECT COATED BLADE 2.86 ST (ELECTRODE) ×3 IMPLANT
ELECT REM PT RETURN 9FT ADLT (ELECTROSURGICAL) ×3
ELECTRODE REM PT RTRN 9FT ADLT (ELECTROSURGICAL) ×1 IMPLANT
GAUZE 4X4 16PLY RFD (DISPOSABLE) IMPLANT
GLOVE BIO SURGEON STRL SZ7 (GLOVE) ×3 IMPLANT
GLOVE BIO SURGEON STRL SZ8 (GLOVE) ×3 IMPLANT
GLOVE BIOGEL PI IND STRL 6.5 (GLOVE) ×3 IMPLANT
GLOVE BIOGEL PI IND STRL 7.0 (GLOVE) ×1 IMPLANT
GLOVE BIOGEL PI INDICATOR 6.5 (GLOVE) ×6
GLOVE BIOGEL PI INDICATOR 7.0 (GLOVE) ×2
GLOVE SURG SS PI 6.0 STRL IVOR (GLOVE) ×15 IMPLANT
GOWN STRL REUS W/ TWL LRG LVL3 (GOWN DISPOSABLE) ×4 IMPLANT
GOWN STRL REUS W/ TWL XL LVL3 (GOWN DISPOSABLE) ×1 IMPLANT
GOWN STRL REUS W/TWL 2XL LVL3 (GOWN DISPOSABLE) IMPLANT
GOWN STRL REUS W/TWL LRG LVL3 (GOWN DISPOSABLE) ×8
GOWN STRL REUS W/TWL XL LVL3 (GOWN DISPOSABLE) ×2
HEMOSTAT POWDER KIT SURGIFOAM (HEMOSTASIS) ×6 IMPLANT
KIT BASIN OR (CUSTOM PROCEDURE TRAY) ×3 IMPLANT
KIT TURNOVER KIT B (KITS) ×3 IMPLANT
NEEDLE HYPO 25X1 1.5 SAFETY (NEEDLE) ×3 IMPLANT
NEEDLE SPNL 20GX3.5 QUINCKE YW (NEEDLE) ×3 IMPLANT
NS IRRIG 1000ML POUR BTL (IV SOLUTION) ×3 IMPLANT
OIL CARTRIDGE MAESTRO DRILL (MISCELLANEOUS) ×3
PACK LAMINECTOMY NEURO (CUSTOM PROCEDURE TRAY) ×3 IMPLANT
PAD ARMBOARD 7.5X6 YLW CONV (MISCELLANEOUS) ×6 IMPLANT
PIN DISTRACTION 14MM (PIN) ×6 IMPLANT
PLATE 48MM (Plate) ×3 IMPLANT
PUTTY BONE DBX 2.5 MIS (Bone Implant) ×3 IMPLANT
RUBBERBAND STERILE (MISCELLANEOUS) ×6 IMPLANT
SCREW CANN 4X16 SS S/DRILL (Screw) ×24 IMPLANT
SPACER PTI-C 7X16X14 IDENTI (Spacer) ×9 IMPLANT
SPONGE INTESTINAL PEANUT (DISPOSABLE) ×3 IMPLANT
SPONGE SURGIFOAM ABS GEL 100 (HEMOSTASIS) ×3 IMPLANT
STRIP CLOSURE SKIN 1/2X4 (GAUZE/BANDAGES/DRESSINGS) ×2 IMPLANT
SUT VIC AB 3-0 SH 8-18 (SUTURE) ×6 IMPLANT
SUT VIC AB 4-0 PS2 18 (SUTURE) ×3 IMPLANT
SUT VICRYL 4-0 PS2 18IN ABS (SUTURE) IMPLANT
TOWEL GREEN STERILE (TOWEL DISPOSABLE) ×3 IMPLANT
TOWEL GREEN STERILE FF (TOWEL DISPOSABLE) ×3 IMPLANT
WATER STERILE IRR 1000ML POUR (IV SOLUTION) ×3 IMPLANT

## 2019-03-26 NOTE — Progress Notes (Signed)
Patient ID: Teresa Barnes, female   DOB: 1946/05/25, 73 y.o.   MRN: [id number] Neuro exam same as pre-op - poor finger extension, grips 4/5, R hemiparetic with severe spasticity R > L, R shoulder girdle 2/5, bicep 3/5, tricep 4-/5, L arm 4-/5, LLE 3-4/5, RLE very spastic and 2/5 HF, 4//5 KE, decreased DF, PF 4/5, no clonus  Dressing dry  She describes less pain in her hands.   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 She will need rehab

## 2019-03-26 NOTE — Transfer of Care (Signed)
Immediate Anesthesia Transfer of Care Note  Patient: Teresa Barnes   Procedure(s) Performed: Anterior Cervical Discectomy Fusion - Cervical Three-Cervical Four - Cervical Four-Cervical Five - Cervical Five-Cervical Six (N/A )     Patient Location: PACU  Anesthesia Type:General  Level of Consciousness: awake, alert  and oriented  Airway & Oxygen Therapy: Patient Spontanous Breathing and Patient connected to face mask oxygen  Post-op Assessment: Report given to RN and Post -op Vital signs reviewed and stable  Post vital signs: Reviewed and stable  Last Vitals:  Vitals Value Taken Time  BP 130/67 03/26/2019  2:05 PM  Temp    Pulse 67 03/26/2019  2:06 PM  Resp 20 03/26/2019  2:06 PM  SpO2 100 % 03/26/2019  2:06 PM  Vitals shown include unvalidated device data.  Last Pain:  Vitals:   03/26/19 0820  TempSrc: Oral         Complications: No apparent anesthesia complications

## 2019-03-26 NOTE — Anesthesia Procedure Notes (Signed)
Procedure Name: Intubation Date/Time: 03/26/2019 10:39 AM Performed by: Mariea Clonts, CRNA Pre-anesthesia Checklist: Patient identified, Emergency Drugs available, Suction available and Patient being monitored Patient Re-evaluated:Patient Re-evaluated prior to induction Oxygen Delivery Method: Circle System Utilized Preoxygenation: Pre-oxygenation with 100% oxygen Induction Type: IV induction Laryngoscope Size: Glidescope and 3 Grade View: Grade II Tube type: Oral Tube size: 7.0 mm Number of attempts: 1 Airway Equipment and Method: Stylet and Oral airway Placement Confirmation: ETT inserted through vocal cords under direct vision,  positive ETCO2 and breath sounds checked- equal and bilateral Tube secured with: Tape Dental Injury: Teeth and Oropharynx as per pre-operative assessment

## 2019-03-26 NOTE — H&P (Signed)
Subjective:   Patient is a 73 y.o. female admitted for acdf. The patient first presented to me with complaints of numbness of the arm(s), loss of strength of the arm(s) and change in gait. Worse after a fall 3 weeks ago when she developed weakness and numbness in her hands and her gait worsened. Onset of symptoms was several weeks ago. The pain is described as aching and occurs all day. The pain is rated severe, and actually is located in the back and radiates to the RLE. The symptoms have been progressive. Symptoms are exacerbated by none, and are relieved by none.  Previous work up includes MRI of cervical spine, results: spinal stenosis.  Past Medical History:  Diagnosis Date  . Anemia   . Anemia of chronic disease 09/24/2018  . Anxiety   . Blood transfusion without reported diagnosis 07/2016  . Cancer ([place]) 1991   breast  . Chronic gout 12/09/2017   On allopurinol  . Diabetes mellitus without complication ([place])   . Esophageal stricture 12/09/2017   S/p dilation by GI: Dr. Scarlette Shorts; due to h/o severe recurrent erosive esophagitis.  . 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 GERD (gastroesophageal reflux disease)   . Glaucoma   . Hiatal hernia 12/09/2017  . Hyperlipidemia   . Hypertension   . Right rotator cuff tear 11/24/2015   Injected this and 26 2017  . Wears glasses     Past Surgical History:  Procedure Laterality Date  . CATARACT EXTRACTION W/ INTRAOCULAR LENS  IMPLANT, BILATERAL  2000  . COLONOSCOPY    . cyst on spine    . ESOPHAGOGASTRODUODENOSCOPY (EGD) WITH PROPOFOL N/A 07/31/2016   Procedure: ESOPHAGOGASTRODUODENOSCOPY (EGD) WITH PROPOFOL;  Surgeon: Manus Gunning, MD;  Location: [facility];  Service: Gastroenterology;  Laterality: N/A;  . EYE SURGERY  2011   lt eye blled  . MASTECTOMY MODIFIED RADICAL Right 1990  . REDUCTION MAMMAPLASTY Left   . TRIGGER FINGER RELEASE Right 06/24/2013   Procedure: RELEASE TRIGGER FINGER/A-1  PULLEY PIP RIGHT LONG FINGER;  Surgeon: Cammie Sickle., MD;  Location: [facility];  Service: Orthopedics;  Laterality: Right;  . TRIGGER FINGER RELEASE    . TUBAL LIGATION      Allergies  Allergen Reactions  . Prednisone Nausea And Vomiting  . Tramadol Nausea Only    Social History   Tobacco Use  . Smoking status: Former Smoker    Packs/day: 0.25    Years: 10.00    Pack years: 2.50    Types: Cigarettes    Last attempt to quit: 06/18/1993    Years since quitting: 25.7  . Smokeless tobacco: Never Used  Substance Use Topics  . Alcohol use: No    Family History  Problem Relation Age of Onset  . Diabetes Mother   . Diabetes Daughter   . Arthritis Daughter   . Hyperlipidemia Daughter   . Diabetes Son   . Arthritis Son   . Diabetes Sister   . Colon cancer Neg Hx   . Cancer Neg Hx   . Heart disease Neg Hx   . Breast cancer Neg Hx    Prior to Admission medications   Medication Sig Start Date End Date Taking? Authorizing Provider  allopurinol (ZYLOPRIM) 100 MG tablet Take 1 tablet (100 mg total) by mouth daily. 03/09/18  Yes Leamon Arnt, MD  bimatoprost (LUMIGAN) 0.01 % SOLN Place 1 drop into both eyes at bedtime.  06/22/12  Yes [provider]  COMBIGAN 0.2-0.5 % ophthalmic solution Place 1 drop into the left eye 2 (two) times a day.  02/28/18  Yes [provider]  Diclofenac Sodium (PENNSAID) 2 % SOLN Apply 1 pump twice daily as needed. Patient taking differently: Apply 1 application topically 2 (two) times daily as needed (PAIN). Apply 1 pump twice daily as needed. 01/14/19  Yes Lyndal Pulley, DO  folic acid (FOLVITE) 1 MG tablet Take 1 mg by mouth daily.   Yes [provider]  glipiZIDE (GLUCOTROL XL) 5 MG 24 hr tablet Take 5 mg by mouth daily as needed (ONLY TAKES FOR BLOOD SUGAR ABOVE 150).   Yes [provider]  lisinopril (PRINIVIL,ZESTRIL) 5 MG tablet TAKE 1 TABLET BY MOUTH ONCE DAILY 12/31/18  Yes Leamon Arnt, MD   lovastatin (MEVACOR) 40 MG tablet Take 1 tablet (40 mg total) by mouth at bedtime. 06/22/18  Yes Leamon Arnt, MD  metFORMIN (GLUCOPHAGE) 1000 MG tablet TAKE 1 TABLET BY MOUTH TWICE DAILY Patient taking differently: Take 1,000 mg by mouth 2 (two) times daily with a meal.  09/18/18  Yes Leamon Arnt, MD  NOVOFINE PLUS 32G X 4 MM MISC Check sugars once per day. Dx: E11.9 02/25/19  Yes Leamon Arnt, MD  omeprazole (PRILOSEC) 40 MG capsule TAKE 1 CAPSULE BY MOUTH ONCE DAILY Patient taking differently: Take 40 mg by mouth daily as needed (HEARTBURN).  07/10/18  Yes Irene Shipper, MD  Propylene Glycol (SYSTANE BALANCE) 0.6 % SOLN Place 1 drop into the right eye 3 (three) times daily as needed (dry eye).   Yes [provider]  SUPER B COMPLEX/C PO Take 1 tablet by mouth daily.    Yes [provider]  VICTOZA 18 MG/3ML SOPN Inject 0.3 mLs (1.8 mg total) into the skin daily. 12/28/18 12/28/19 Yes Leamon Arnt, MD  NONFORMULARY OR COMPOUNDED ITEM Apply 1-2 g topically daily. [facility]  lacquer: Fluconazole 2%, Terbinafine 1%, DMSO Patient not taking: Reported on 03/22/2019 02/16/19   Marzetta Board, DPM     Review of Systems  Positive ROS: neg  All other systems have been reviewed and were otherwise negative with the exception of those mentioned in the HPI and as above.  Objective: Vital signs in last 24 hours: Temp:  [98.3 F (36.8 C)] 98.3 F (36.8 C) (05/08 0820) Pulse Rate:  [64] 64 (05/08 0820) Resp:  [18] 18 (05/08 0820) BP: (167)/(43) 167/43 (05/08 0820) SpO2:  [99 %] 99 % (05/08 0820) Weight:  [81.6 kg] 81.6 kg (05/08 0820)  General Appearance: Alert, cooperative, no distress, appears stated age Head: Normocephalic, without obvious abnormality, atraumatic Eyes: PERRL, conjunctiva/corneas clear, EOM's intact      Neck: Supple, symmetrical, trachea midline, Back: Symmetric, no curvature, ROM normal, no CVA tenderness Lungs:  respirations  unlabored Heart: Regular rate and rhythm Abdomen: Soft, non-tender Extremities: Extremities normal, atraumatic, no cyanosis or edema Pulses: 2+ and symmetric all extremities Skin: Skin color, texture, turgor normal, no rashes or lesions  NEUROLOGIC:  Mental status: Alert and oriented x4, no aphasia, good attention span, fund of knowledge and memory  Motor Exam - hands grips 4-/5, poor finger extension Sensory Exam - decreased in hands Reflexes: 3+ Coordination - decreased in hands Gait - not tested Balance - not tested Cranial Nerves: I: smell Not tested  II: visual acuity  OS: nl    OD: nl  II: visual fields Full to confrontation  II: pupils Equal, round, reactive to light  III,VII: ptosis None  III,IV,VI: extraocular muscles  Full ROM  V: mastication Normal  V: facial light touch sensation  Normal  V,VII: corneal reflex  Present  VII: facial muscle function - upper  Normal  VII: facial muscle function - lower Normal  VIII: hearing Not tested  IX: soft palate elevation  Normal  IX,X: gag reflex Present  XI: trapezius strength  5/5  XI: sternocleidomastoid strength 5/5  XI: neck flexion strength  5/5  XII: tongue strength  Normal    Data Review Lab Results  Component Value Date   WBC 6.8 03/24/2019   HGB 10.7 (L) 03/24/2019   HCT 33.8 (L) 03/24/2019   MCV 94.7 03/24/2019   PLT 341 03/24/2019   Lab Results  Component Value Date   NA 138 03/24/2019   K 4.5 03/24/2019   CL 103 03/24/2019   CO2 23 03/24/2019   BUN 24 (H) 03/24/2019   CREATININE 1.35 (H) 03/24/2019   GLUCOSE 100 (H) 03/24/2019   Lab Results  Component Value Date   INR 1.0 03/24/2019    Assessment:   Cervical neck pain with herniated nucleus pulposus/ spondylosis/ stenosis at C3-4 C4-5 C5-6 with progessive severe myelopathy. Estimated body mass index is 30.9 kg/m as calculated from the following:   Height as of this encounter: 5\' 4"  (1.626 m).   Weight as of this encounter: 81.6 kg.   Patient has failed conservative therapy. Planned surgery : ACDF C3-4 C4-5 C5-6  Plan:   I explained the condition and procedure to the patient and answered any questions.  Patient wishes to proceed with procedure as planned. Understands risks/ benefits/ and expected or typical outcomes, and accepts there is a risk, albeit small, of paralysis with intubation and/ or surgery given the severity of the cord compression, but we feel the risk to her is much higher without surgery given her rapid progression (esp after her fall 3 weeks ago)  Eustace Moore 03/26/2019 9:16 AM

## 2019-03-26 NOTE — Plan of Care (Signed)
  Problem: Activity: Goal: Ability to tolerate increased activity will improve Outcome: Progressing   

## 2019-03-26 NOTE — Anesthesia Preprocedure Evaluation (Addendum)
Anesthesia Evaluation  Patient identified by MRN, date of birth, ID band Patient awake    Reviewed: Allergy & Precautions, NPO status , Patient's Chart, lab work & pertinent test results  History of Anesthesia Complications Negative for: history of anesthetic complications  Airway Mallampati: II  TM Distance: >3 FB Neck ROM: Limited    Dental  (+) Teeth Intact   Pulmonary neg pulmonary ROS, former smoker,    breath sounds clear to auscultation       Cardiovascular hypertension, Pt. on medications (-) angina(-) Past MI and (-) CHF (-) dysrhythmias  Rhythm:Regular     Neuro/Psych Anxiety  Neuromuscular disease negative psych ROS   GI/Hepatic Neg liver ROS, hiatal hernia, GERD  Medicated,  Endo/Other  diabetes, Type 2, Oral Hypoglycemic Agents  Renal/GU negative Renal ROS     Musculoskeletal  (+) Arthritis ,   Abdominal   Peds  Hematology  (+) Blood dyscrasia, anemia ,   Anesthesia Other Findings   Reproductive/Obstetrics                            Anesthesia Physical  Anesthesia Plan  ASA: III  Anesthesia Plan: General   Post-op Pain Management:    Induction: Intravenous  PONV Risk Score and Plan: 2 and Ondansetron and Treatment may vary due to age or medical condition  Airway Management Planned: Video Laryngoscope Planned and Oral ETT  Additional Equipment: None  Intra-op Plan:   Post-operative Plan: Extubation in OR  Informed Consent: I have reviewed the patients History and Physical, chart, labs and discussed the procedure including the risks, benefits and alternatives for the proposed anesthesia with the patient or authorized representative who has indicated his/her understanding and acceptance.     Dental advisory given  Plan Discussed with: CRNA, Surgeon and Anesthesiologist  Anesthesia Plan Comments: (  )        Anesthesia Quick Evaluation

## 2019-03-26 NOTE — Op Note (Signed)
03/26/2019  1:45 PM  PATIENT:  Teresa Barnes  73 y.o. 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 Decompressive anterior cervical discectomy C3-4 C4-5 C5-6, 2. Anterior cervical arthrodesis C3-4 C4-5 C5-6 utilizing a porous titanium interbody cage packed with locally harvested morcellized autologous bone graft and morselized allograft, 3. Anterior cervical plating C3-C6 inclusive utilizing a Alphatec plate  SURGEON:  Sherley Bounds, MD  ASSISTANTS: Glenford Peers FNP  ANESTHESIA:   General  EBL: 200 ml  Total I/O In: 1000 [I.V.:1000] Out: 200 [Blood:200]  BLOOD ADMINISTERED: none  DRAINS: none  SPECIMEN:  none  INDICATION FOR PROCEDURE: This patient presented with numbness and weakness in her hands with hyperreflexia and spastic gait after a recent fall. Imaging showed severe spinal stenosis C3-4 C4-5 C5-6. The patient tried conservative measures without relief. Pain was debilitating. Recommended ACDF with plating. Patient understood the risks, benefits, and alternatives and potential outcomes and wished to proceed.  PROCEDURE DETAILS: Patient was brought to the operating room placed under general endotracheal anesthesia.  Anesthesia new of the patient's critical spinal stenosis and she was intubated without extension.  The patient was placed in the supine position on the operating room table with the neck in neutral position. The neck was prepped with Duraprep and draped in a sterile fashion.   Three cc of local anesthesia was injected and a transverse incision was made on the right side of the neck.  Dissection was carried down thru the subcutaneous tissue and the platysma was  elevated, opened, and undermined with Metzenbaum scissors.  Dissection was then carried out thru an avascular plane leaving the sternocleidomastoid carotid artery and jugular  vein laterally and the trachea and esophagus medially. The ventral aspect of the vertebral column was identified and a localizing x-ray was taken. The C3 4 level was identified. The longus colli muscles were then elevated and the retractor was placed to expose C3-4 C4-5 C5-6. The annulus was incised and the disc space entered.  Exact same decompression was performed at each level.  Discectomy was performed with micro-curettes and pituitary rongeurs. I then used the high-speed drill to drill the endplates down to the level of the posterior longitudinal ligament. The drill shavings were saved in a mucous trap for later arthrodesis. The operating microscope was draped and brought into the field provided additional magnification, illumination and visualization. Discectomy was continued posteriorly thru the disc space. Posterior longitudinal ligament was opened with a nerve hook, and then removed along with disc herniation and osteophytes, decompressing the spinal canal and thecal sac.  A very gentle decompression was performed because of the patient's critical stenosis and severe cord compression.  We then continued to remove osteophytic overgrowth and disc material decompressing the neural foramina and exiting nerve roots bilaterally. The scope was angled up and down to help decompress and undercut the vertebral bodies. Once the decompression was completed we could pass a nerve hook circumferentially to assure adequate decompression in the midline and in the neural foramina. So by both visualization and palpation we felt we had an adequate decompression of the neural elements. We then measured the height of the intravertebral disc space and selected a 7 millimeter porous titanium interbody cage packed with autograft and autolyzed allograft. It was then gently positioned in the intravertebral disc space(s) and countersunk. I then used a 8 mm Alphatec plate and placed 14 mm variable angle screws into  the vertebral bodies  of each level and locked them into position. The wound was irrigated with bacitracin solution, checked for hemostasis which was established and confirmed. Once meticulous hemostasis was achieved, we then proceeded with closure. The platysma was closed with interrupted 3-0 undyed Vicryl suture, the subcuticular layer was closed with interrupted 3-0 undyed Vicryl suture. The skin edges were approximated with steristrips. The drapes were removed. A sterile dressing was applied. The patient was then awakened from general anesthesia and transferred to the recovery room in stable condition. At the end of the procedure all sponge, needle and instrument counts were correct.   PLAN OF CARE: Admit to inpatient   PATIENT DISPOSITION:  PACU - hemodynamically stable.   Delay start of Pharmacological VTE agent (>24hrs) due to surgical blood loss or risk of bleeding:  yes

## 2019-03-27 LAB — GLUCOSE, CAPILLARY
Glucose-Capillary: 206 mg/dL — ABNORMAL HIGH (ref 70–99)
Glucose-Capillary: 139 mg/dL — ABNORMAL HIGH (ref 70–99)
Glucose-Capillary: 190 mg/dL — ABNORMAL HIGH (ref 70–99)
Glucose-Capillary: 139 mg/dL — ABNORMAL HIGH (ref 70–99)

## 2019-03-27 NOTE — Progress Notes (Signed)
Patient ID: Teresa Barnes, female   DOB: 06-14-1946, 73 y.o.   MRN: [id number] Patient is awake and alert and vital signs are stable Her incision is clean and dry and swallowing she notes is improved today over yesterday She does note the weakness in her upper extremities with the right side in her arm and her leg being much weaker than the left.  She has a 3 out of 5 grip on the left 2 out of 5 grip on the right she does have antigravity strength in the right leg.  She will be requiring rehab.

## 2019-03-27 NOTE — Evaluation (Signed)
Physical Therapy Evaluation Patient Details Name: Teresa Barnes MRN: [id number] DOB: 01/21/1946 Today's Date: 03/27/2019   History of Present Illness  Pt is a 73 y.o. F with significant PMH of breast CA, DM, right rotator cuff tear who presents with cervical spondylitic myelopathy. Now s/p decompression anterior cercival discectomy C3-6, anterior cervical arthrodesis C3-6  Clinical Impression  Pt admitted with above. Prior to symptom onset, pt independent and enjoyed walking and going to church. In 3 weeks prior to surgery, pt has been increasingly debilitated by progressive weakness and has required assist from husband for ADL's and mobility. On PT evaluation, pt presents with decreased functional mobility secondary to right arm weakness, decreased range of motion, mild right leg weakness, and balance impairments. Pt reporting notable improvements in left sided strength post surgery. Pt initially fearful of movement, but very receptive to education and benefits from positive reinforcement. Requiring moderate assistance for bed mobility and transfers to standing. Unable to progress pre gait training today but suspect pt will be able to soon based on assessment. Highly recommend CIR to maximize functional independence and decrease caregiver burden.    Follow Up Recommendations CIR    Equipment Recommendations  Other (comment)(tbd)    Recommendations for Other Services Rehab consult     Precautions / Restrictions Precautions Precautions: Cervical;Fall Precaution Booklet Issued: No Precaution Comments: reviewed log roll technique Restrictions Weight Bearing Restrictions: No      Mobility  Bed Mobility Overal bed mobility: Needs Assistance Bed Mobility: Rolling;Sidelying to Sit;Sit to Sidelying Rolling: Mod assist Sidelying to sit: Mod assist     Sit to sidelying: Max assist General bed mobility comments: Cues for log roll technique, rolling towards right side. Moderate assist to  guide legs off edge of bed and trunk elevation. Max assist for return to bed   Transfers Overall transfer level: Needs assistance Equipment used: None Transfers: Sit to/from Stand Sit to Stand: Mod assist         General transfer comment: Mod assist for face to face transfer, limited use of RUE to push off. Good activation of glutes/quads   Ambulation/Gait                Stairs            Wheelchair Mobility    Modified Rankin (Stroke Patients Only)       Balance Overall balance assessment: Needs assistance Sitting-balance support: Feet supported Sitting balance-Leahy Scale: Fair Sitting balance - Comments: Supervision for static sitting balance   Standing balance support: Single extremity supported;During functional activity Standing balance-Leahy Scale: Poor Standing balance comment: reliant on external support                             Pertinent Vitals/Pain Pain Assessment: Faces Faces Pain Scale: Hurts little more Pain Location: surgical site Pain Descriptors / Indicators: Operative site guarding Pain Intervention(s): Monitored during session;Limited activity within patient's tolerance;Repositioned    Home Living Family/patient expects to be discharged to:: Private residence Living Arrangements: Spouse/significant other Available Help at Discharge: Family Type of Home: House Home Access: Stairs to enter Entrance Stairs-Rails: Left Entrance Stairs-Number of Steps: 3 Home Layout: Able to live on main level with bedroom/bathroom Home Equipment: Walker - 4 wheels;Bedside commode      Prior Function Level of Independence: Needs assistance   Gait / Transfers Assistance Needed: last 3 weeks has been needing physical assistance for limited household walking  ADL's / Homemaking Assistance  Needed: last 3 weeks, needing assist for all ADL's. Unable to wash her hair        Hand Dominance   Dominant Hand: Right    Extremity/Trunk  Assessment   Upper Extremity Assessment Upper Extremity Assessment: Defer to OT evaluation;RUE deficits/detail RUE Deficits / Details: increased edema    Lower Extremity Assessment Lower Extremity Assessment: RLE deficits/detail;LLE deficits/detail RLE Deficits / Details: Strength 4/5 LLE Deficits / Details: Strength 5/5    Cervical / Trunk Assessment Cervical / Trunk Assessment: Other exceptions Cervical / Trunk Exceptions: s/p cervical sx  Communication   Communication: No difficulties  Cognition Arousal/Alertness: Awake/alert Behavior During Therapy: WFL for tasks assessed/performed Overall Cognitive Status: Within Functional Limits for tasks assessed                                 General Comments: Somewhat fearful and self limiting at times, but very responsive to education      General Comments      Exercises Other Exercises Other Exercises: Encouraged RUE exercises i.e. finger flex/ext, wrist ROM, elbow flexion   Assessment/Plan    PT Assessment Patient needs continued PT services  PT Problem List Decreased strength;Decreased range of motion;Decreased activity tolerance;Decreased balance;Decreased mobility;Decreased coordination;Pain       PT Treatment Interventions DME instruction;Gait training;Functional mobility training;Therapeutic activities;Therapeutic exercise;Balance training;Neuromuscular re-education;Patient/family education    PT Goals (Current goals can be found in the Care Plan section)  Acute Rehab PT Goals Patient Stated Goal: "take care of myself, wash my hair, go to church, walk." PT Goal Formulation: With patient Time For Goal Achievement: 04/10/19 Potential to Achieve Goals: Good    Frequency Min 5X/week   Barriers to discharge        Co-evaluation               AM-PAC PT "6 Clicks" Mobility  Outcome Measure Help needed turning from your back to your side while in a flat bed without using bedrails?: A Lot Help  needed moving from lying on your back to sitting on the side of a flat bed without using bedrails?: A Lot Help needed moving to and from a bed to a chair (including a wheelchair)?: A Lot Help needed standing up from a chair using your arms (e.g., wheelchair or bedside chair)?: A Lot Help needed to walk in hospital room?: Total Help needed climbing 3-5 steps with a railing? : Total 6 Click Score: 10    End of Session Equipment Utilized During Treatment: Gait belt Activity Tolerance: Patient tolerated treatment well Patient left: in bed;with call bell/phone within reach Nurse Communication: Mobility status PT Visit Diagnosis: Unsteadiness on feet (R26.81);Other abnormalities of gait and mobility (R26.89);Pain Pain - part of body: (neck)    Time: 2831-5176 PT Time Calculation (min) (ACUTE ONLY): 40 min   Charges:   PT Evaluation $PT Eval Moderate Complexity: 1 Mod PT Treatments $Therapeutic Activity: 23-37 mins        Ellamae Sia, PT, DPT Acute Rehabilitation Services Pager [phone] Office [phone]   Willy Eddy 03/27/2019, 9:11 AM

## 2019-03-27 NOTE — Progress Notes (Addendum)
Occupational Therapy Evaluation Patient Details Name: Teresa Barnes MRN: [id number] DOB: Oct 18, 1946 Today's Date: 03/27/2019    History of Present Illness Pt is a 73 y.o. F with significant PMH of breast CA, DM, right rotator cuff tear who presents with cervical spondylitic myelopathy. Now s/p decompression anterior cercival discectomy C3-6, anterior cervical arthrodesis C3-6   Clinical Impression   PTA, pt was living at home with her husband, and in the 3 weeks prior to surgery she has required assistance from her husband with ADL/IADL and functional mobility due to progressive weakness. Upon arrival, pt sitting in bed, appeared fearful but pleasantly agreeable to session after educating her on importance and benefits of mobility. Pt currently requires modA for bed mobility in preparation for ADL. Pt limited to bed level this session secondary to lightheadedness upon sitting EOB, BP 150/56 nsg aware, and pt requesting to return to supine. Pt demonstrates limited functional use of dominant, RUE secondary to weakness. Pt able to self-feed with built-up utensil and use of LUE to assist RUE for first 81min, progressing to modA for self-feeding demonstrating limited activity tolerance. Due to decline in current level of function, pt would benefit from acute OT to address established goals to facilitate safe D/C to venue listed below. At this time, recommend CIR follow-up. Will continue to follow acutely.     Follow Up Recommendations  CIR    Equipment Recommendations  3 in 1 bedside commode    Recommendations for Other Services PT consult     Precautions / Restrictions Precautions Precautions: Cervical;Fall Precaution Booklet Issued: Yes (comment) Precaution Comments: reviewed log roll technique Restrictions Weight Bearing Restrictions: No      Mobility Bed Mobility Overal bed mobility: Needs Assistance Bed Mobility: Rolling;Sidelying to Sit;Sit to Sidelying Rolling: Mod  assist Sidelying to sit: Mod assist     Sit to sidelying: Max assist General bed mobility comments: Cues for log roll technique, rolling towards right side. Moderate assist to guide legs off edge of bed and trunk elevation. Max assist for return to bed   Transfers                 General transfer comment: did not attempt this session secondary to pt reports of lightheadedness and pt request to remain bed level secondary to fear of falling    Balance Overall balance assessment: Needs assistance Sitting-balance support: Feet supported Sitting balance-Leahy Scale: Fair Sitting balance - Comments: Supervision for static sitting balance                                   ADL either performed or assessed with clinical judgement   ADL Overall ADL's : Needs assistance/impaired Eating/Feeding: Moderate assistance;Sitting;Minimal assistance Eating/Feeding Details (indicate cue type and reason): pt requires built up handle and use of LUE to guide RUE when self-feeding;pt with decreased activity tolerance, able to self-feed 64minutes prior to fatigue and self-limiting  Grooming: Moderate assistance;Minimal assistance   Upper Body Bathing: Moderate assistance;Sitting   Lower Body Bathing: Moderate assistance   Upper Body Dressing : Moderate assistance   Lower Body Dressing: Maximal assistance     Toilet Transfer Details (indicate cue type and reason): did not attempt this session            General ADL Comments: did not attempt OOB mobility this session secondary to pt reports of lightheadedness when sitting EOB, BP 150/56 nsg aware     Vision  Baseline Vision/History: Wears glasses Patient Visual Report: No change from baseline       Perception     Praxis      Pertinent Vitals/Pain Pain Assessment: 0-10 Pain Score: 6  Faces Pain Scale: Hurts little more Pain Location: surgical site Pain Descriptors / Indicators: Operative site guarding Pain  Intervention(s): Monitored during session     Hand Dominance Right   Extremity/Trunk Assessment Upper Extremity Assessment Upper Extremity Assessment: Generalized weakness;RUE deficits/detail;LUE deficits/detail RUE Deficits / Details: increased edema, educated pt and nsg on importance of mobility of RUE and elevation; full AROM MCP, PIP, DIP digits 1,2,3,5;unable to fully extend PIP and DIP of 4th digit; 4/5 grip strength; 3-/5 elbow flexion and extension; decreased coordination, touched 1st digit to lips instead of nose;limited in use of RUE functionally, has been using L hand for brushing teeth and eating;built up handle of utensil and educated pt on importance of using RUE during functional tasks, informated nsg to motivate pt to use RUE during first 5-54min of activity or as long as tolerable.  RUE Sensation: decreased proprioception RUE Coordination: decreased fine motor;decreased gross motor LUE Deficits / Details: decreased fine motor coordination, unable to remove lid from food items;assists RUE with LUE;utilizes LUE to compensate for limited functional use of RUE;elbow flexion/extension 3/5, grip strength 3-/5; LUE Sensation: decreased proprioception LUE Coordination: decreased fine motor;decreased gross motor   Lower Extremity Assessment Lower Extremity Assessment: Defer to PT evaluation RLE Deficits / Details: Strength 4/5 LLE Deficits / Details: Strength 5/5   Cervical / Trunk Assessment Cervical / Trunk Assessment: Other exceptions Cervical / Trunk Exceptions: s/p cervical sx   Communication Communication Communication: No difficulties   Cognition Arousal/Alertness: Awake/alert Behavior During Therapy: WFL for tasks assessed/performed Overall Cognitive Status: Within Functional Limits for tasks assessed                                 General Comments: Somewhat fearful and self limiting at times, but very responsive to education; pt reports coughing  following every bite when eating, nsg notified.    General Comments       Exercises Exercises: Other exercises Other Exercises Other Exercises: Encouraged RUE exercises i.e. finger flex/ext, wrist ROM, elbow flexion   Shoulder Instructions      Home Living Family/patient expects to be discharged to:: Private residence Living Arrangements: Spouse/significant other Available Help at Discharge: Family Type of Home: House Home Access: Stairs to enter Technical brewer of Steps: 3 Entrance Stairs-Rails: Left Home Layout: Able to live on main level with bedroom/bathroom     Bathroom Shower/Tub: Occupational psychologist: Standard     Home Equipment: Environmental consultant - 4 wheels;Bedside commode          Prior Functioning/Environment Level of Independence: Needs assistance  Gait / Transfers Assistance Needed: last 3 weeks has been needing physical assistance for limited household walking ADL's / Homemaking Assistance Needed: last 3 weeks, needing assist for all ADL's. Unable to wash her hair            OT Problem List: Decreased strength;Decreased range of motion;Decreased activity tolerance;Impaired balance (sitting and/or standing);Decreased safety awareness;Decreased knowledge of use of DME or AE;Impaired UE functional use;Pain;Increased edema      OT Treatment/Interventions: Self-care/ADL training;Therapeutic exercise;Energy conservation;DME and/or AE instruction;Therapeutic activities;Patient/family education;Balance training    OT Goals(Current goals can be found in the care plan section) Acute Rehab OT Goals Patient Stated Goal: "  get back to taking care of myself" OT Goal Formulation: With patient Time For Goal Achievement: 04/10/19 Potential to Achieve Goals: Good ADL Goals Pt Will Perform Eating: with modified independence Pt Will Perform Grooming: with modified independence Pt Will Perform Upper Body Dressing: with modified independence Pt Will Perform Lower  Body Dressing: with modified independence Pt Will Transfer to Toilet: with modified independence  OT Frequency: Min 2X/week   Barriers to D/C:            Co-evaluation              AM-PAC OT "6 Clicks" Daily Activity     Outcome Measure Help from another person eating meals?: A Lot Help from another person taking care of personal grooming?: A Lot Help from another person toileting, which includes using toliet, bedpan, or urinal?: Total Help from another person bathing (including washing, rinsing, drying)?: A Lot Help from another person to put on and taking off regular upper body clothing?: A Lot Help from another person to put on and taking off regular lower body clothing?: A Lot 6 Click Score: 11   End of Session Nurse Communication: Mobility status  Activity Tolerance: Patient tolerated treatment well;Treatment limited secondary to medical complications (Comment) Patient left: in bed;with call bell/phone within reach  OT Visit Diagnosis: Unsteadiness on feet (R26.81);Other abnormalities of gait and mobility (R26.89);Muscle weakness (generalized) (M62.81);Feeding difficulties (R63.3);Pain Pain - part of body: (neck, surgical site)                Time: 7903-8333 OT Time Calculation (min): 41 min Charges:  OT General Charges $OT Visit: 1 Visit OT Evaluation $OT Eval Moderate Complexity: 1 Mod OT Treatments $Self Care/Home Management : 23-37 mins  Dorinda Hill OTR/L Acute Rehabilitation Services Office: [place] 03/27/2019, 1:28 PM

## 2019-03-28 LAB — GLUCOSE, CAPILLARY
Glucose-Capillary: 85 mg/dL (ref 70–99)
Glucose-Capillary: 107 mg/dL — ABNORMAL HIGH (ref 70–99)
Glucose-Capillary: 150 mg/dL — ABNORMAL HIGH (ref 70–99)
Glucose-Capillary: 111 mg/dL — ABNORMAL HIGH (ref 70–99)
Glucose-Capillary: 100 mg/dL — ABNORMAL HIGH (ref 70–99)

## 2019-03-28 NOTE — Progress Notes (Signed)
Neurosurgery Service Progress Note  Subjective: No acute events overnight. Swallowing improving, RUE strength improving, especially distally   Objective: Vitals:   03/27/19 1955 03/28/19 0005 03/28/19 0402 03/28/19 0838  BP: (!) 145/50 (!) 155/64 (!) 142/75 (!) 152/127  Pulse: 82 74 76 91  Resp: 16 16 16 20   Temp: 98.3 F (36.8 C) 99 F (37.2 C) 98.2 F (36.8 C) 98 F (36.7 C)  TempSrc: Oral Oral Oral   SpO2: 97% 96% 97%   Weight:      Height:       Temp (24hrs), Avg:98.4 F (36.9 C), Min:98 F (36.7 C), Max:99 F (37.2 C)  CBC Latest Ref Rng & Units 03/24/2019 06/22/2018 12/09/2017  WBC 4.0 - 10.5 K/uL 6.8 7.1 7.0  Hemoglobin 12.0 - 15.0 g/dL 10.7(L) 10.5(L) 11.3(L)  Hematocrit 36.0 - 46.0 % 33.8(L) 31.1(L) 34.2(L)  Platelets 150 - 400 K/uL 341 362.0 354.0   BMP Latest Ref Rng & Units 03/24/2019 03/09/2018 12/09/2017  Glucose 70 - 99 mg/dL 100(H) 146(H) 177(H)  BUN 8 - 23 mg/dL 24(H) 29(H) 30(H)  Creatinine 0.44 - 1.00 mg/dL 1.35(H) 1.20 1.46(H)  Sodium 135 - 145 mmol/L 138 138 137  Potassium 3.5 - 5.1 mmol/L 4.5 4.6 4.5  Chloride 98 - 111 mmol/L 103 100 101  CO2 22 - 32 mmol/L 23 22 29   Calcium 8.9 - 10.3 mg/dL 9.9 9.7 10.2    Intake/Output Summary (Last 24 hours) at 03/28/2019 0858 Last data filed at 03/28/2019 0553 Gross per 24 hour  Intake 543.34 ml  Output 1100 ml  Net -556.66 ml    Current Facility-Administered Medications:  .  0.9 %  sodium chloride infusion, 250 mL, Intravenous, Continuous, Eustace Moore, MD .  0.9 % NaCl with KCl 20 mEq/ L  infusion, , Intravenous, Continuous, Eustace Moore, MD, Last Rate: 75 mL/hr at 03/28/19 0603 .  acetaminophen (TYLENOL) tablet 650 mg, 650 mg, Oral, Q4H PRN **OR** acetaminophen (TYLENOL) suppository 650 mg, 650 mg, Rectal, Q4H PRN, Eustace Moore, MD .  allopurinol (ZYLOPRIM) tablet 100 mg, 100 mg, Oral, Daily, Eustace Moore, MD, 100 mg at 03/27/19 1016 .  brimonidine (ALPHAGAN) 0.2 % ophthalmic solution 1 drop, 1 drop,  Left Eye, BID, 1 drop at 03/27/19 1017 **OR** timolol (TIMOPTIC) 0.5 % ophthalmic solution 1 drop, 1 drop, Left Eye, BID, Wynell Balloon, RPH, 1 drop at 03/27/19 2133 .  dexamethasone (DECADRON) injection 4 mg, 4 mg, Intravenous, Q6H, 4 mg at 03/28/19 0534 **OR** dexamethasone (DECADRON) tablet 4 mg, 4 mg, Oral, Q6H, Eustace Moore, MD, 4 mg at 03/27/19 1709 .  folic acid (FOLVITE) tablet 1 mg, 1 mg, Oral, Daily, Eustace Moore, MD, 1 mg at 03/27/19 1016 .  glipiZIDE (GLUCOTROL XL) 24 hr tablet 5 mg, 5 mg, Oral, Daily PRN, Eustace Moore, MD, 5 mg at 03/27/19 1024 .  insulin aspart (novoLOG) injection 0-15 Units, 0-15 Units, Subcutaneous, TID WC, Eustace Moore, MD, 3 Units at 03/27/19 1708 .  latanoprost (XALATAN) 0.005 % ophthalmic solution 1 drop, 1 drop, Both Eyes, QHS, Eustace Moore, MD, 1 drop at 03/27/19 2135 .  lisinopril (ZESTRIL) tablet 5 mg, 5 mg, Oral, Daily, Eustace Moore, MD, 5 mg at 03/27/19 1016 .  menthol-cetylpyridinium (CEPACOL) lozenge 3 mg, 1 lozenge, Oral, PRN **OR** phenol (CHLORASEPTIC) mouth spray 1 spray, 1 spray, Mouth/Throat, PRN, Eustace Moore, MD, 1 spray at 03/26/19 1633 .  metFORMIN (GLUCOPHAGE) tablet 1,000 mg, 1,000 mg, Oral,  BID, Eustace Moore, MD, 1,000 mg at 03/27/19 2132 .  methocarbamol (ROBAXIN) tablet 500 mg, 500 mg, Oral, Q6H PRN, 500 mg at 03/27/19 2138 **OR** methocarbamol (ROBAXIN) 500 mg in dextrose 5 % 50 mL IVPB, 500 mg, Intravenous, Q6H PRN, Eustace Moore, MD .  morphine 2 MG/ML injection 2 mg, 2 mg, Intravenous, Q2H PRN, Eustace Moore, MD, 2 mg at 03/27/19 0402 .  ondansetron (ZOFRAN) tablet 4 mg, 4 mg, Oral, Q6H PRN **OR** ondansetron (ZOFRAN) injection 4 mg, 4 mg, Intravenous, Q6H PRN, Eustace Moore, MD .  oxyCODONE (Oxy IR/ROXICODONE) immediate release tablet 5 mg, 5 mg, Oral, Q3H PRN, Eustace Moore, MD, 5 mg at 03/27/19 2137 .  senna (SENOKOT) tablet 8.6 mg, 1 tablet, Oral, BID, Eustace Moore, MD, 8.6 mg at 03/27/19 2132 .  sodium  chloride flush (NS) 0.9 % injection 3 mL, 3 mL, Intravenous, Q12H, Eustace Moore, MD, 3 mL at 03/27/19 2136 .  sodium chloride flush (NS) 0.9 % injection 3 mL, 3 mL, Intravenous, PRN, Eustace Moore, MD   Physical Exam: R hemiparesis w/ some inc'd tone, RUE 3/5 proximally and 4-/5 distally, RLE 4/5, LUE/LLE 4+/5 Incision c/d/i w/ dressing in place, neck soft  Assessment & Plan: 73 y.o. woman s/p fall with SCI s/p C3-6 ACDF, recovering well. -strength improving -ADAT -CIR planning  Judith Part  03/28/19 8:58 AM

## 2019-03-28 NOTE — Progress Notes (Signed)
Occupational Therapy Treatment Patient Details Name: Teresa Barnes MRN: [id number] DOB: [birth year] Today's Date: 03/28/2019    History of present illness Pt is a 73 y.o. F with significant PMH of breast CA, DM, right rotator cuff tear who presents with cervical spondylitic myelopathy. Now s/p decompression anterior cercival discectomy C3-6, anterior cervical arthrodesis C3-6   OT comments  Pt progressing towards established OT goals. Pt motivated to participate in therapy. Pt performing functional mobility in room with Min-Mod A +2 and RW. Providing built up handles for self feeding and grooming. Pt donning built up handle and using during lunch with Min A for meal prep and RUE. Continue to recommend dc to CIR and will continue to follow acutely as admitted.    Follow Up Recommendations  CIR    Equipment Recommendations  3 in 1 bedside commode    Recommendations for Other Services PT consult    Precautions / Restrictions Precautions Precautions: Cervical;Fall Precaution Booklet Issued: Yes (comment) Precaution Comments: reviewed log roll technique Restrictions Weight Bearing Restrictions: No       Mobility Bed Mobility Overal bed mobility: Needs Assistance Bed Mobility: Rolling;Sidelying to Sit;Sit to Sidelying Rolling: Min guard Sidelying to sit: Min assist       General bed mobility comments: Min assist for trunk elevation  Transfers Overall transfer level: Needs assistance Equipment used: Rolling walker (2 wheeled) Transfers: Sit to/from Stand Sit to Stand: Mod assist         General transfer comment: Cues for hand positiong (right hand on walker, left hand pushing up from bed)    Balance Overall balance assessment: Needs assistance Sitting-balance support: Feet supported Sitting balance-Leahy Scale: Good Sitting balance - Comments: Supervision for static sitting balance   Standing balance support: Bilateral upper extremity supported;During functional  activity Standing balance-Leahy Scale: Poor Standing balance comment: reliant on external support                           ADL either performed or assessed with clinical judgement   ADL Overall ADL's : Needs assistance/impaired Eating/Feeding: Sitting;Minimal assistance Eating/Feeding Details (indicate cue type and reason): Min A to prep meal to optimize success. Pt donning built up handle onto fork. Pt reporting success with built up handle.                  Lower Body Dressing: Moderate assistance;+2 for safety/equipment;Sit to/from stand( simulated to recliner) Lower Body Dressing Details (indicate cue type and reason): Mod A +2 for safety and to power up.              Functional mobility during ADLs: Moderate assistance;+2 for physical assistance General ADL Comments: Pt highly motivated to participate in therapy. Performing functional mobility in room and then self feeding with built up handle     Vision       Perception     Praxis      Cognition Arousal/Alertness: Awake/alert Behavior During Therapy: WFL for tasks assessed/performed Overall Cognitive Status: Within Functional Limits for tasks assessed                                 General Comments: Somewhat fearful and self limiting at times, but very responsive to education        Exercises Exercises: General Lower Extremity General Exercises - Lower Extremity Heel Slides: 5 reps;Both;Supine Hip ABduction/ADduction: 5 reps;Both;Supine Straight Leg Raises:  5 reps;Both;Supine Other Exercises Other Exercises: Encouraged RUE exercises i.e. finger flex/ext, wrist ROM, elbow flexion   Shoulder Instructions       General Comments VSS    Pertinent Vitals/ Pain       Pain Assessment: Faces Faces Pain Scale: Hurts a little bit Pain Location: surgical site Pain Descriptors / Indicators: Operative site guarding Pain Intervention(s): Monitored during session;Limited activity  within patient's tolerance;Repositioned  Home Living                                          Prior Functioning/Environment              Frequency  Min 2X/week        Progress Toward Goals  OT Goals(current goals can now be found in the care plan section)  Progress towards OT goals: Progressing toward goals  Acute Rehab OT Goals Patient Stated Goal: "get back to taking care of myself" OT Goal Formulation: With patient Time For Goal Achievement: 04/10/19 Potential to Achieve Goals: Good ADL Goals Pt Will Perform Eating: with modified independence Pt Will Perform Grooming: with modified independence Pt Will Perform Upper Body Dressing: with modified independence Pt Will Perform Lower Body Dressing: with modified independence Pt Will Transfer to Toilet: with modified independence  Plan Discharge plan remains appropriate    Co-evaluation    PT/OT/SLP Co-Evaluation/Treatment: Yes Reason for Co-Treatment: For patient/therapist safety;To address functional/ADL transfers PT goals addressed during session: Mobility/safety with mobility OT goals addressed during session: ADL's and self-care      AM-PAC OT "6 Clicks" Daily Activity     Outcome Measure   Help from another person eating meals?: A Lot Help from another person taking care of personal grooming?: A Lot Help from another person toileting, which includes using toliet, bedpan, or urinal?: Total Help from another person bathing (including washing, rinsing, drying)?: A Lot Help from another person to put on and taking off regular upper body clothing?: A Lot Help from another person to put on and taking off regular lower body clothing?: A Lot 6 Click Score: 11    End of Session Equipment Utilized During Treatment: Rolling walker;Gait belt;Other (comment)(built up handles)  OT Visit Diagnosis: Unsteadiness on feet (R26.81);Other abnormalities of gait and mobility (R26.89);Muscle weakness  (generalized) (M62.81);Feeding difficulties (R63.3);Pain Pain - part of body: (neck, surgical site)   Activity Tolerance Patient tolerated treatment well;Treatment limited secondary to medical complications (Comment)   Patient Left with call bell/phone within reach;in chair;with chair alarm set   Nurse Communication Mobility status        Time: 7673-4193 OT Time Calculation (min): 32 min  Charges: OT General Charges $OT Visit: 1 Visit OT Treatments $Self Care/Home Management : 8-22 mins  [place], OTR/L Acute Rehab Pager: [phone] Office: [place] 03/28/2019, 4:16 PM

## 2019-03-28 NOTE — Anesthesia Postprocedure Evaluation (Signed)
Anesthesia Post Note  Patient: ARMELLA STOGNER  Procedure(s) Performed: Anterior Cervical Discectomy Fusion - Cervical Three-Cervical Four - Cervical Four-Cervical Five - Cervical Five-Cervical Six (N/A )     Patient location during evaluation: PACU Anesthesia Type: General Level of consciousness: awake and alert Pain management: pain level controlled Vital Signs Assessment: post-procedure vital signs reviewed and stable Respiratory status: spontaneous breathing, nonlabored ventilation, respiratory function stable and patient connected to nasal cannula oxygen Cardiovascular status: blood pressure returned to baseline and stable Postop Assessment: no apparent nausea or vomiting Anesthetic complications: no    Last Vitals:  Vitals:   03/28/19 1231 03/28/19 1632  BP: (!) 144/73 (!) 173/79  Pulse: 79 71  Resp: 20 20  Temp: 36.6 C 36.8 C  SpO2: 97% 100%    Last Pain:  Vitals:   03/28/19 1632  TempSrc: Oral  PainSc:                  Khylah Kendra

## 2019-03-28 NOTE — Progress Notes (Signed)
Physical Therapy Treatment Patient Details Name: Teresa Barnes MRN: [id number] DOB: 10/04/1946 Today's Date: 03/28/2019    History of Present Illness Pt is a 73 y.o. F with significant PMH of breast CA, DM, right rotator cuff tear who presents with cervical spondylitic myelopathy. Now s/p decompression anterior cercival discectomy C3-6, anterior cervical arthrodesis C3-6    PT Comments    Pt making excellent progress towards her physical therapy goals. Noted improving RUE strength. Pt requiring moderate assistance for transfers and ambulating x 10 feet with walker and min assist (+2 for safety/chair follow). Pt continues with weakness, gait abnormalities, decreased right sided proprioception, and balance impairments. Remains a great candidate for CIR.    Follow Up Recommendations  CIR     Equipment Recommendations  Rolling walker with 5" wheels    Recommendations for Other Services Rehab consult     Precautions / Restrictions Precautions Precautions: Cervical;Fall Precaution Booklet Issued: Yes (comment) Restrictions Weight Bearing Restrictions: No    Mobility  Bed Mobility Overal bed mobility: Needs Assistance Bed Mobility: Rolling;Sidelying to Sit;Sit to Sidelying Rolling: Min guard Sidelying to sit: Min assist       General bed mobility comments: Min assist for trunk elevation  Transfers Overall transfer level: Needs assistance Equipment used: Rolling walker (2 wheeled) Transfers: Sit to/from Stand Sit to Stand: Mod assist         General transfer comment: Cues for hand positiong (right hand on walker, left hand pushing up from bed)  Ambulation/Gait Ambulation/Gait assistance: Min assist;+2 physical assistance;+2 safety/equipment Gait Distance (Feet): 10 Feet Assistive device: Rolling walker (2 wheeled) Gait Pattern/deviations: Step-to pattern;Step-through pattern;Decreased stride length;Decreased step length - left;Trunk flexed;Narrow base of support Gait  velocity: decreased Gait velocity interpretation: <1.31 ft/sec, indicative of household ambulator General Gait Details: Verbal and visual cues for upright posture, wider BOS, step length, keeping toes straight forward. Somewhat erratic R foot placement due to decreased proprioception   Stairs             Wheelchair Mobility    Modified Rankin (Stroke Patients Only)       Balance Overall balance assessment: Needs assistance Sitting-balance support: Feet supported Sitting balance-Leahy Scale: Good     Standing balance support: Bilateral upper extremity supported;During functional activity Standing balance-Leahy Scale: Poor Standing balance comment: reliant on external support                            Cognition Arousal/Alertness: Awake/alert Behavior During Therapy: WFL for tasks assessed/performed Overall Cognitive Status: Within Functional Limits for tasks assessed                                 General Comments: Somewhat fearful and self limiting at times, but very responsive to education      Exercises General Exercises - Lower Extremity Heel Slides: 5 reps;Both;Supine Hip ABduction/ADduction: 5 reps;Both;Supine Straight Leg Raises: 5 reps;Both;Supine    General Comments        Pertinent Vitals/Pain Pain Assessment: Faces Faces Pain Scale: Hurts a little bit Pain Location: surgical site Pain Descriptors / Indicators: Operative site guarding Pain Intervention(s): Monitored during session;Premedicated before session    Home Living                      Prior Function            PT Goals (current goals  can now be found in the care plan section) Acute Rehab PT Goals Patient Stated Goal: "get back to taking care of myself" Potential to Achieve Goals: Good Progress towards PT goals: Progressing toward goals    Frequency    Min 5X/week      PT Plan Current plan remains appropriate    Co-evaluation PT/OT/SLP  Co-Evaluation/Treatment: Yes Reason for Co-Treatment: For patient/therapist safety;To address functional/ADL transfers PT goals addressed during session: Mobility/safety with mobility        AM-PAC PT "6 Clicks" Mobility   Outcome Measure  Help needed turning from your back to your side while in a flat bed without using bedrails?: A Little Help needed moving from lying on your back to sitting on the side of a flat bed without using bedrails?: A Little Help needed moving to and from a bed to a chair (including a wheelchair)?: A Little Help needed standing up from a chair using your arms (e.g., wheelchair or bedside chair)?: A Lot Help needed to walk in hospital room?: A Lot Help needed climbing 3-5 steps with a railing? : Total 6 Click Score: 14    End of Session Equipment Utilized During Treatment: Gait belt Activity Tolerance: Patient tolerated treatment well Patient left: in chair;with call bell/phone within reach Nurse Communication: Mobility status PT Visit Diagnosis: Unsteadiness on feet (R26.81);Other abnormalities of gait and mobility (R26.89);Pain     Time: 1200-1230 PT Time Calculation (min) (ACUTE ONLY): 30 min  Charges:  $Gait Training: 8-22 mins                     Ellamae Sia, [place], DPT Acute Rehabilitation Services Pager [phone] Office [phone]    Willy Eddy 03/28/2019, 1:09 PM

## 2019-03-29 ENCOUNTER — Other Ambulatory Visit: Payer: Self-pay

## 2019-03-29 ENCOUNTER — Encounter (HOSPITAL_COMMUNITY): Payer: Self-pay | Admitting: Neurological Surgery

## 2019-03-29 ENCOUNTER — Inpatient Hospital Stay (HOSPITAL_COMMUNITY)
Admission: RE | Admit: 2019-03-29 | Discharge: 2019-04-13 | DRG: 560 | Disposition: A | Discharge status: Home Health | Payer: Medicare Other | Source: Intra-hospital | Attending: Physical Medicine & Rehabilitation | Admitting: Physical Medicine & Rehabilitation

## 2019-03-29 DIAGNOSIS — Z4789 Encounter for other orthopedic aftercare: Principal | ICD-10-CM

## 2019-03-29 DIAGNOSIS — Z87891 Personal history of nicotine dependence: Secondary | ICD-10-CM

## 2019-03-29 DIAGNOSIS — Z7984 Long term (current) use of oral hypoglycemic drugs: Secondary | ICD-10-CM

## 2019-03-29 DIAGNOSIS — M7501 Adhesive capsulitis of right shoulder: Secondary | ICD-10-CM | POA: Diagnosis not present

## 2019-03-29 DIAGNOSIS — Z885 Allergy status to narcotic agent status: Secondary | ICD-10-CM | POA: Diagnosis not present

## 2019-03-29 DIAGNOSIS — E785 Hyperlipidemia, unspecified: Secondary | ICD-10-CM | POA: Diagnosis present

## 2019-03-29 DIAGNOSIS — H409 Unspecified glaucoma: Secondary | ICD-10-CM | POA: Diagnosis present

## 2019-03-29 DIAGNOSIS — D638 Anemia in other chronic diseases classified elsewhere: Secondary | ICD-10-CM | POA: Diagnosis not present

## 2019-03-29 DIAGNOSIS — M109 Gout, unspecified: Secondary | ICD-10-CM | POA: Diagnosis not present

## 2019-03-29 DIAGNOSIS — K592 Neurogenic bowel, not elsewhere classified: Secondary | ICD-10-CM | POA: Diagnosis present

## 2019-03-29 DIAGNOSIS — Z981 Arthrodesis status: Secondary | ICD-10-CM

## 2019-03-29 DIAGNOSIS — G992 Myelopathy in diseases classified elsewhere: Secondary | ICD-10-CM | POA: Diagnosis present

## 2019-03-29 DIAGNOSIS — I10 Essential (primary) hypertension: Secondary | ICD-10-CM | POA: Diagnosis present

## 2019-03-29 DIAGNOSIS — Z9011 Acquired absence of right breast and nipple: Secondary | ICD-10-CM | POA: Diagnosis not present

## 2019-03-29 DIAGNOSIS — M4802 Spinal stenosis, cervical region: Secondary | ICD-10-CM | POA: Diagnosis present

## 2019-03-29 DIAGNOSIS — K449 Diaphragmatic hernia without obstruction or gangrene: Secondary | ICD-10-CM | POA: Diagnosis not present

## 2019-03-29 DIAGNOSIS — G959 Disease of spinal cord, unspecified: Secondary | ICD-10-CM | POA: Diagnosis present

## 2019-03-29 DIAGNOSIS — E119 Type 2 diabetes mellitus without complications: Secondary | ICD-10-CM | POA: Diagnosis not present

## 2019-03-29 DIAGNOSIS — Z79899 Other long term (current) drug therapy: Secondary | ICD-10-CM | POA: Diagnosis not present

## 2019-03-29 DIAGNOSIS — K219 Gastro-esophageal reflux disease without esophagitis: Secondary | ICD-10-CM | POA: Diagnosis present

## 2019-03-29 DIAGNOSIS — M75101 Unspecified rotator cuff tear or rupture of right shoulder, not specified as traumatic: Secondary | ICD-10-CM | POA: Diagnosis not present

## 2019-03-29 DIAGNOSIS — F419 Anxiety disorder, unspecified: Secondary | ICD-10-CM | POA: Diagnosis present

## 2019-03-29 DIAGNOSIS — Z833 Family history of diabetes mellitus: Secondary | ICD-10-CM | POA: Diagnosis not present

## 2019-03-29 DIAGNOSIS — Z853 Personal history of malignant neoplasm of breast: Secondary | ICD-10-CM | POA: Diagnosis not present

## 2019-03-29 DIAGNOSIS — K5900 Constipation, unspecified: Secondary | ICD-10-CM | POA: Diagnosis present

## 2019-03-29 DIAGNOSIS — E1142 Type 2 diabetes mellitus with diabetic polyneuropathy: Secondary | ICD-10-CM | POA: Diagnosis not present

## 2019-03-29 DIAGNOSIS — Z888 Allergy status to other drugs, medicaments and biological substances status: Secondary | ICD-10-CM

## 2019-03-29 DIAGNOSIS — E1169 Type 2 diabetes mellitus with other specified complication: Secondary | ICD-10-CM | POA: Diagnosis not present

## 2019-03-29 DIAGNOSIS — E669 Obesity, unspecified: Secondary | ICD-10-CM | POA: Diagnosis not present

## 2019-03-29 DIAGNOSIS — K2100 Gastro-esophageal reflux disease with esophagitis, without bleeding: Secondary | ICD-10-CM

## 2019-03-29 DIAGNOSIS — M7989 Other specified soft tissue disorders: Secondary | ICD-10-CM | POA: Diagnosis not present

## 2019-03-29 LAB — GLUCOSE, CAPILLARY
Glucose-Capillary: 147 mg/dL — ABNORMAL HIGH (ref 70–99)
Glucose-Capillary: 227 mg/dL — ABNORMAL HIGH (ref 70–99)
Glucose-Capillary: 157 mg/dL — ABNORMAL HIGH (ref 70–99)

## 2019-03-29 MED ORDER — OXYCODONE HCL 5 MG PO TABS
5.0000 mg | ORAL_TABLET | ORAL | Status: DC | PRN
  Administered 2019-03-29 – 2019-04-11 (×13): 5 mg via ORAL
  Filled 2019-03-29 (×13): qty 1

## 2019-03-29 MED ORDER — TIMOLOL MALEATE 0.5 % OP SOLN
1.0000 [drp] | Freq: Two times a day (BID) | OPHTHALMIC | Status: DC
  Administered 2019-03-29: 1 [drp] via OPHTHALMIC
  Filled 2019-03-29: qty 5

## 2019-03-29 MED ORDER — BRIMONIDINE TARTRATE 0.2 % OP SOLN
1.0000 [drp] | Freq: Two times a day (BID) | OPHTHALMIC | Status: DC
  Administered 2019-03-30 – 2019-03-31 (×3): 1 [drp] via OPHTHALMIC
  Filled 2019-03-29: qty 5

## 2019-03-29 MED ORDER — SORBITOL 70 % SOLN
30.0000 mL | Freq: Every day | Status: DC | PRN
  Filled 2019-03-29 (×2): qty 30

## 2019-03-29 MED ORDER — DEXAMETHASONE SODIUM PHOSPHATE 4 MG/ML IJ SOLN
4.0000 mg | Freq: Four times a day (QID) | INTRAMUSCULAR | Status: DC
  Administered 2019-03-30: 4 mg via INTRAVENOUS
  Filled 2019-03-29 (×2): qty 1

## 2019-03-29 MED ORDER — ACETAMINOPHEN 650 MG RE SUPP: 650.0000 mg | RECTAL | Status: DC | PRN

## 2019-03-29 MED ORDER — LATANOPROST 0.005 % OP SOLN
1.0000 [drp] | Freq: Every day | OPHTHALMIC | Status: DC
  Administered 2019-03-29 – 2019-04-12 (×15): 1 [drp] via OPHTHALMIC
  Filled 2019-03-29: qty 2.5

## 2019-03-29 MED ORDER — GLIPIZIDE ER 5 MG PO TB24
5.0000 mg | ORAL_TABLET | Freq: Every day | ORAL | Status: DC | PRN
  Administered 2019-03-30 – 2019-04-12 (×12): 5 mg via ORAL
  Filled 2019-03-29 (×13): qty 1

## 2019-03-29 MED ORDER — DEXAMETHASONE 4 MG PO TABS
4.0000 mg | ORAL_TABLET | Freq: Four times a day (QID) | ORAL | Status: DC
  Administered 2019-03-29 – 2019-03-30 (×2): 4 mg via ORAL
  Filled 2019-03-29 (×3): qty 1

## 2019-03-29 MED ORDER — SENNA 8.6 MG PO TABS
1.0000 | ORAL_TABLET | Freq: Two times a day (BID) | ORAL | Status: DC
  Administered 2019-03-29: 8.6 mg via ORAL
  Filled 2019-03-29 (×2): qty 1

## 2019-03-29 MED ORDER — INSULIN ASPART 100 UNIT/ML ~~LOC~~ SOLN
0.0000 [IU] | Freq: Three times a day (TID) | SUBCUTANEOUS | Status: DC
  Administered 2019-03-30: 13:00:00 5 [IU] via SUBCUTANEOUS
  Administered 2019-03-30: 2 [IU] via SUBCUTANEOUS
  Administered 2019-03-30: 5 [IU] via SUBCUTANEOUS
  Administered 2019-03-31 – 2019-04-01 (×5): 3 [IU] via SUBCUTANEOUS
  Administered 2019-04-02: 5 [IU] via SUBCUTANEOUS
  Administered 2019-04-02: 2 [IU] via SUBCUTANEOUS
  Administered 2019-04-02 – 2019-04-03 (×2): 5 [IU] via SUBCUTANEOUS
  Administered 2019-04-03: 3 [IU] via SUBCUTANEOUS
  Administered 2019-04-03 – 2019-04-04 (×2): 5 [IU] via SUBCUTANEOUS
  Administered 2019-04-04: 3 [IU] via SUBCUTANEOUS
  Administered 2019-04-04: 8 [IU] via SUBCUTANEOUS
  Administered 2019-04-05 (×2): 5 [IU] via SUBCUTANEOUS
  Administered 2019-04-06: 3 [IU] via SUBCUTANEOUS
  Administered 2019-04-06: 8 [IU] via SUBCUTANEOUS
  Administered 2019-04-06: 2 [IU] via SUBCUTANEOUS
  Administered 2019-04-07: 5 [IU] via SUBCUTANEOUS
  Administered 2019-04-07: 3 [IU] via SUBCUTANEOUS
  Administered 2019-04-07 – 2019-04-08 (×2): 8 [IU] via SUBCUTANEOUS
  Administered 2019-04-08: 3 [IU] via SUBCUTANEOUS
  Administered 2019-04-09: 8 [IU] via SUBCUTANEOUS
  Administered 2019-04-09 (×2): 3 [IU] via SUBCUTANEOUS
  Administered 2019-04-10 (×2): 5 [IU] via SUBCUTANEOUS
  Administered 2019-04-11: 3 [IU] via SUBCUTANEOUS
  Administered 2019-04-11 (×2): 8 [IU] via SUBCUTANEOUS
  Administered 2019-04-12 (×2): 3 [IU] via SUBCUTANEOUS
  Administered 2019-04-12: 2 [IU] via SUBCUTANEOUS

## 2019-03-29 MED ORDER — ACETAMINOPHEN 325 MG PO TABS
650.0000 mg | ORAL_TABLET | ORAL | Status: DC | PRN
  Administered 2019-03-31 – 2019-04-07 (×4): 650 mg via ORAL
  Filled 2019-03-29 (×4): qty 2

## 2019-03-29 MED ORDER — ONDANSETRON HCL 4 MG PO TABS: 4.0000 mg | ORAL_TABLET | Freq: Four times a day (QID) | ORAL | Status: DC | PRN

## 2019-03-29 MED ORDER — METHOCARBAMOL 500 MG PO TABS
500.0000 mg | ORAL_TABLET | Freq: Four times a day (QID) | ORAL | Status: DC | PRN
  Administered 2019-03-30 – 2019-04-01 (×3): 500 mg via ORAL
  Filled 2019-03-29 (×3): qty 1

## 2019-03-29 MED ORDER — METFORMIN HCL 500 MG PO TABS
1000.0000 mg | ORAL_TABLET | Freq: Two times a day (BID) | ORAL | Status: DC
  Administered 2019-03-30 – 2019-04-13 (×29): 1000 mg via ORAL
  Filled 2019-03-29 (×29): qty 2

## 2019-03-29 MED ORDER — LISINOPRIL 5 MG PO TABS
5.0000 mg | ORAL_TABLET | Freq: Every day | ORAL | Status: DC
  Administered 2019-03-30 – 2019-04-13 (×12): 5 mg via ORAL
  Filled 2019-03-29 (×15): qty 1

## 2019-03-29 MED ORDER — FOLIC ACID 1 MG PO TABS
1.0000 mg | ORAL_TABLET | Freq: Every day | ORAL | Status: DC
  Administered 2019-03-30 – 2019-04-13 (×15): 1 mg via ORAL
  Filled 2019-03-29 (×15): qty 1

## 2019-03-29 MED ORDER — ONDANSETRON HCL 4 MG/2ML IJ SOLN: 4.0000 mg | Freq: Four times a day (QID) | INTRAMUSCULAR | Status: DC | PRN

## 2019-03-29 MED ORDER — METHOCARBAMOL 1000 MG/10ML IJ SOLN
500.0000 mg | Freq: Four times a day (QID) | INTRAVENOUS | Status: DC | PRN
  Filled 2019-03-29: qty 5

## 2019-03-29 MED ORDER — ALLOPURINOL 100 MG PO TABS
100.0000 mg | ORAL_TABLET | Freq: Every day | ORAL | Status: DC
  Administered 2019-03-30 – 2019-04-13 (×15): 100 mg via ORAL
  Filled 2019-03-29 (×15): qty 1

## 2019-03-29 MED FILL — Gelatin Absorbable MT Powder: OROMUCOSAL | Qty: 1 | Status: AC

## 2019-03-29 MED FILL — Thrombin For Soln 5000 Unit: CUTANEOUS | Qty: 5000 | Status: AC

## 2019-03-29 NOTE — H&P (Signed)
Physical Medicine and Rehabilitation Admission H&P    Chief complaint: Weakness HPI: Teresa Barnes is a 73 year old right-handed female with history of diabetes mellitus, esophageal stricture with dilatation, chronic anemia, breast cancer, gout, hypertension and hyperlipidemia.  Per chart review patient lives with spouse.  Two-level home with bedroom on Main level.  Patient was independent until approximately 3 weeks ago needing physical assistance for limited household ambulation.  Presented 03/26/2019 with complaints of numbness of the arms, loss of strength and limited gait.  Patient with noted fall 3 weeks ago.  X-rays and imaging revealed critical cervical spinal stenosis C3-4, spinal stenosis C4-5, C5-6, cervical myelopathy.  Underwent decompressive anterior cervical discectomy C3-4, 4-5, 5-6 with anterior cervical arthrodesis and anterior cervical plating C3-C6 03/26/2019 per Dr. Sherley Bounds.  No cervical collar needed.  Hospital course pain management.  Decadron protocol as indicated.  Therapy evaluations completed and patient was admitted for a comprehensive rehab program.  Review of Systems  Constitutional: Negative for fever.  HENT: Negative for hearing loss.   Eyes: Negative for blurred vision.  Respiratory: Negative for cough.   Cardiovascular: Negative for chest pain.  Gastrointestinal: Positive for nausea and vomiting.  Genitourinary: Positive for dysuria.  Musculoskeletal: Negative for myalgias.  Skin: Negative for rash.  Neurological: Positive for dizziness, weakness and headaches.  Psychiatric/Behavioral: Negative for depression.   Past Medical History:  Diagnosis Date  . Anemia   . Anemia of chronic disease 09/24/2018  . Anxiety   . Blood transfusion without reported diagnosis 07/2016  . Cancer ([place]) 1991   breast  . Chronic gout 12/09/2017   On allopurinol  . Diabetes mellitus without complication ([place])   . Esophageal stricture 12/09/2017   S/p dilation by GI: Dr.  Scarlette Shorts; due to h/o severe recurrent erosive esophagitis.  . 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 GERD (gastroesophageal reflux disease)   . Glaucoma   . Hiatal hernia 12/09/2017  . Hyperlipidemia   . Hypertension   . Right rotator cuff tear 11/24/2015   Injected this and 26 2017  . Wears glasses    Past Surgical History:  Procedure Laterality Date  . CATARACT EXTRACTION W/ INTRAOCULAR LENS  IMPLANT, BILATERAL  2000  . COLONOSCOPY    . cyst on spine    . ESOPHAGOGASTRODUODENOSCOPY (EGD) WITH PROPOFOL N/A 07/31/2016   Procedure: ESOPHAGOGASTRODUODENOSCOPY (EGD) WITH PROPOFOL;  Surgeon: Manus Gunning, MD;  Location: [facility];  Service: Gastroenterology;  Laterality: N/A;  . EYE SURGERY  2011   lt eye blled  . MASTECTOMY MODIFIED RADICAL Right 1990  . REDUCTION MAMMAPLASTY Left   . TRIGGER FINGER RELEASE Right 06/24/2013   Procedure: RELEASE TRIGGER FINGER/A-1 PULLEY PIP RIGHT LONG FINGER;  Surgeon: Cammie Sickle., MD;  Location: [facility];  Service: Orthopedics;  Laterality: Right;  . TRIGGER FINGER RELEASE    . TUBAL LIGATION     Family History  Problem Relation Age of Onset  . Diabetes Mother   . Diabetes Daughter   . Arthritis Daughter   . Hyperlipidemia Daughter   . Diabetes Son   . Arthritis Son   . Diabetes Sister   . Colon cancer Neg Hx   . Cancer Neg Hx   . Heart disease Neg Hx   . Breast cancer Neg Hx    Social History:  reports that she quit smoking about 25 years ago. Her smoking use included cigarettes. She has a 2.50  pack-year smoking history. She has never used smokeless tobacco. She reports that she does not drink alcohol or use drugs. Allergies:  Allergies  Allergen Reactions  . Prednisone Nausea And Vomiting  . Tramadol Nausea Only   Medications Prior to Admission  Medication Sig Dispense Refill  . allopurinol (ZYLOPRIM) 100 MG tablet Take 1 tablet (100 mg total) by mouth daily. 90  tablet 3  . bimatoprost (LUMIGAN) 0.01 % SOLN Place 1 drop into both eyes at bedtime.     . COMBIGAN 0.2-0.5 % ophthalmic solution Place 1 drop into the left eye 2 (two) times a day.   3  . Diclofenac Sodium (PENNSAID) 2 % SOLN Apply 1 pump twice daily as needed. (Patient taking differently: Apply 1 application topically 2 (two) times daily as needed (PAIN). Apply 1 pump twice daily as needed.) 778 g 3  . folic acid (FOLVITE) 1 MG tablet Take 1 mg by mouth daily.    Marland Kitchen glipiZIDE (GLUCOTROL XL) 5 MG 24 hr tablet Take 5 mg by mouth daily as needed (ONLY TAKES FOR BLOOD SUGAR ABOVE 150).    Marland Kitchen lisinopril (PRINIVIL,ZESTRIL) 5 MG tablet TAKE 1 TABLET BY MOUTH ONCE DAILY 90 tablet 0  . lovastatin (MEVACOR) 40 MG tablet Take 1 tablet (40 mg total) by mouth at bedtime. 90 tablet 3  . metFORMIN (GLUCOPHAGE) 1000 MG tablet TAKE 1 TABLET BY MOUTH TWICE DAILY (Patient taking differently: Take 1,000 mg by mouth 2 (two) times daily with a meal. ) 180 tablet 1  . NOVOFINE PLUS 32G X 4 MM MISC Check sugars once per day. Dx: E11.9 100 each 11  . omeprazole (PRILOSEC) 40 MG capsule TAKE 1 CAPSULE BY MOUTH ONCE DAILY (Patient taking differently: Take 40 mg by mouth daily as needed (HEARTBURN). ) 30 capsule 2  . Propylene Glycol (SYSTANE BALANCE) 0.6 % SOLN Place 1 drop into the right eye 3 (three) times daily as needed (dry eye).    . SUPER B COMPLEX/C PO Take 1 tablet by mouth daily.     Marland Kitchen VICTOZA 18 MG/3ML SOPN Inject 0.3 mLs (1.8 mg total) into the skin daily. 9 pen 3  . NONFORMULARY OR COMPOUNDED ITEM Apply 1-2 g topically daily. [facility]  lacquer: Fluconazole 2%, Terbinafine 1%, DMSO (Patient not taking: Reported on 03/22/2019) 120 each 3    Drug Regimen Review Drug regimen was reviewed and remains appropriate with no significant issues identified  Home: Home Living Family/patient expects to be discharged to:: Private residence Living Arrangements: Spouse/significant other Available Help at  Discharge: Family Type of Home: House Home Access: Stairs to enter Technical brewer of Steps: 3 Entrance Stairs-Rails: Left Home Layout: Able to live on main level with bedroom/bathroom Bathroom Shower/Tub: Multimedia programmer: [place]: Environmental consultant - 4 wheels, Bedside commode   Functional History: Prior Function Level of Independence: Needs assistance Gait / Transfers Assistance Needed: last 3 weeks has been needing physical assistance for limited household walking ADL's / Homemaking Assistance Needed: last 3 weeks, needing assist for all ADL's. Unable to wash her hair  Functional Status:  Mobility: Bed Mobility Overal bed mobility: Needs Assistance Bed Mobility: Rolling, Sidelying to Sit, Sit to Sidelying Rolling: Min guard Sidelying to sit: Min assist Sit to sidelying: Max assist General bed mobility comments: Min assist for trunk elevation Transfers Overall transfer level: Needs assistance Equipment used: Rolling walker (2 wheeled) Transfers: Sit to/from Stand Sit to Stand: Mod assist General transfer comment: Cues for hand positiong (right hand on  walker, left hand pushing up from bed) Ambulation/Gait Ambulation/Gait assistance: Min assist, +2 physical assistance, +2 safety/equipment Gait Distance (Feet): 10 Feet Assistive device: Rolling walker (2 wheeled) Gait Pattern/deviations: Step-to pattern, Step-through pattern, Decreased stride length, Decreased step length - left, Trunk flexed, Narrow base of support General Gait Details: Verbal and visual cues for upright posture, wider BOS, step length, keeping toes straight forward. Somewhat erratic R foot placement due to decreased proprioception Gait velocity: decreased Gait velocity interpretation: <1.31 ft/sec, indicative of household ambulator    ADL: ADL Overall ADL's : Needs assistance/impaired Eating/Feeding: Sitting, Minimal assistance Eating/Feeding Details (indicate cue type and  reason): Min A to prep meal to optimize success. Pt donning built up handle onto fork. Pt reporting success with built up handle.  Grooming: Moderate assistance, Minimal assistance Upper Body Bathing: Moderate assistance, Sitting Lower Body Bathing: Moderate assistance Upper Body Dressing : Moderate assistance Lower Body Dressing: Moderate assistance, +2 for safety/equipment, Sit to/from stand( simulated to recliner) Lower Body Dressing Details (indicate cue type and reason): Mod A +2 for safety and to power up.  Toilet Transfer Details (indicate cue type and reason): did not attempt this session  Functional mobility during ADLs: Moderate assistance, +2 for physical assistance General ADL Comments: Pt highly motivated to participate in therapy. Performing functional mobility in room and then self feeding with built up handle  Cognition: Cognition Overall Cognitive Status: Within Functional Limits for tasks assessed Orientation Level: Oriented X4 Cognition Arousal/Alertness: Awake/alert Behavior During Therapy: WFL for tasks assessed/performed Overall Cognitive Status: Within Functional Limits for tasks assessed General Comments: Somewhat fearful and self limiting at times, but very responsive to education  Physical Exam: Blood pressure (!) 115/50, pulse 63, temperature 98.1 F (36.7 C), resp. rate 20, height 5\' 4"  (1.626 m), weight 83.8 kg, SpO2 98 %. Physical Exam  Constitutional: She is oriented to person, place, and time. She appears well-developed and well-nourished.  HENT:  Head: Normocephalic and atraumatic.  Eyes: Pupils are equal, round, and reactive to light. EOM are normal.  Neck: Normal range of motion. No tracheal deviation present. No thyromegaly present.  Cardiovascular: Normal rate and regular rhythm. Exam reveals no gallop and no friction rub.  No murmur heard. Respiratory: Effort normal and breath sounds normal. No respiratory distress. She has no wheezes. She has no  rales.  GI: Soft. She exhibits no distension. There is no abdominal tenderness. There is no rebound.  Musculoskeletal: Normal range of motion.        General: No edema.     Comments: Right shoulder with limited PROM---45-55 abduction, some associated pain  Neurological: She is alert and oriented to person, place, and time. No cranial nerve deficit.  Normal insight and awareness, RUE 2/5 prox to 3- to 3/5 distally. LUE 3/5 prox to 3+/5 distally.  LE 4/5 prox to distal. Decreased sensation R>L to LT/pain. DTR's 1+ UE and 2+ LE.   Skin: Skin is warm. No erythema.  Neck incision CDI with steri-strips.   Psychiatric: She has a normal mood and affect. Her behavior is normal. Judgment and thought content normal.    Results for orders placed or performed during the hospital encounter of 03/26/19 (from the past 48 hour(s))  Glucose, capillary     Status: Abnormal   Collection Time: 03/27/19 11:38 AM  Result Value Ref Range   Glucose-Capillary 139 (H) 70 - 99 mg/dL  Glucose, capillary     Status: Abnormal   Collection Time: 03/27/19  4:25 PM  Result Value  Ref Range   Glucose-Capillary 190 (H) 70 - 99 mg/dL  Glucose, capillary     Status: Abnormal   Collection Time: 03/27/19  9:22 PM  Result Value Ref Range   Glucose-Capillary 139 (H) 70 - 99 mg/dL   Comment 1 Notify RN    Comment 2 Document in Chart   Glucose, capillary     Status: None   Collection Time: 03/28/19  8:37 AM  Result Value Ref Range   Glucose-Capillary 85 70 - 99 mg/dL  Glucose, capillary     Status: Abnormal   Collection Time: 03/28/19 12:23 PM  Result Value Ref Range   Glucose-Capillary 107 (H) 70 - 99 mg/dL  Glucose, capillary     Status: Abnormal   Collection Time: 03/28/19  4:29 PM  Result Value Ref Range   Glucose-Capillary 150 (H) 70 - 99 mg/dL  Glucose, capillary     Status: Abnormal   Collection Time: 03/28/19  9:22 PM  Result Value Ref Range   Glucose-Capillary 111 (H) 70 - 99 mg/dL  Glucose, capillary      Status: Abnormal   Collection Time: 03/28/19 11:14 PM  Result Value Ref Range   Glucose-Capillary 100 (H) 70 - 99 mg/dL  Glucose, capillary     Status: Abnormal   Collection Time: 03/29/19  8:41 AM  Result Value Ref Range   Glucose-Capillary 147 (H) 70 - 99 mg/dL   No results found.     Medical Problem List and Plan: 1.  Decreased functional mobility secondary to cervical stenosis with myelopathy status post decompressive anterior cervical discectomy 03/26/2019.  No cervical collar needed  -admit to inpatient rehab 2.  Antithrombotics: -DVT/anticoagulation: SCDs.  Check vascular study  -antiplatelet therapy: N/A 3. Pain Management: Oxycodone and Robaxin as needed 4. Mood: Provide emotional support  -antipsychotic agents: N/A 5. Neuropsych: This patient is capable of making decisions on his own behalf. 6. Skin/Wound Care: Routine skin checks 7. Fluids/Electrolytes/Nutrition: Routine in and outs with follow-up chemistries  -encouraged to maintain PO intake despite nausea 8.  Diabetes mellitus with peripheral neuropathy.  Hemoglobin A1c 6.6.  SSI, Glucophage 1000 mg twice daily, Glucotrol XL 5 mg daily if blood sugar greater than 150.  Check blood sugars before meals and at bedtime 9.  Hypertension.  Lisinopril 5 mg daily 10.  Constipation/neurogenic bowel.    -establish daily bowel program        Cathlyn Parsons, PA-C 03/29/2019

## 2019-03-29 NOTE — Progress Notes (Addendum)
Physical Therapy Treatment Patient Details Name: Teresa Barnes MRN: [id number] DOB: 11-20-1945 Today's Date: 03/29/2019    History of Present Illness Pt is a 73 y.o. F with significant PMH of breast CA, DM, right rotator cuff tear who presents with cervical spondylitic myelopathy. Now s/p decompression anterior cercival discectomy C3-6, anterior cervical arthrodesis C3-6    PT Comments    Patient progressing well towards PT goals. Improved ambulation distance today as well as gait mechanics from prior session. Better right foot placement noted during gait training today. Requires cues for upright posture and to prevent left lateral lean. Reports some improvement with right digit extension. Pt seems less fearful of falling today. Continues to require assist for bed mobility and log roll technique. Reviewed precautions. Highly motivated to return to independence. Will continue to follow.   Follow Up Recommendations  CIR     Equipment Recommendations  Rolling walker with 5" wheels    Recommendations for Other Services       Precautions / Restrictions Precautions Precautions: Cervical;Fall Precaution Booklet Issued: Yes (comment) Precaution Comments: reviewed precautions Restrictions Weight Bearing Restrictions: No    Mobility  Bed Mobility Overal bed mobility: Needs Assistance Bed Mobility: Rolling;Sidelying to Sit Rolling: Min assist Sidelying to sit: Min assist;HOB elevated       General bed mobility comments: Cues for log roll technique, assist with rolling to reach for rail and to elevate trunk to sit upright.  Transfers Overall transfer level: Needs assistance Equipment used: Rolling walker (2 wheeled) Transfers: Sit to/from Stand Sit to Stand: Min assist         General transfer comment: Assist to power to standing with cues for hand placement/technique. Stood from [employer], from chair x1.   Ambulation/Gait Ambulation/Gait assistance: Min assist;+2  safety/equipment Gait Distance (Feet): 16 Feet(+ 16') Assistive device: Rolling walker (2 wheeled) Gait Pattern/deviations: Step-to pattern;Step-through pattern;Decreased stride length;Decreased step length - left;Trunk flexed;Narrow base of support;Decreased weight shift to right Gait velocity: decreased   General Gait Details: Slow, mildly unsteady gait with cues for upright posture. Tends to lean left but able to self correct with cues. Better placement of right foot today. 1 seated rest break.   Stairs             Wheelchair Mobility    Modified Rankin (Stroke Patients Only)       Balance Overall balance assessment: Needs assistance Sitting-balance support: Feet supported;No upper extremity supported Sitting balance-Leahy Scale: Good Sitting balance - Comments: Supervision for static sitting balance   Standing balance support: Bilateral upper extremity supported;During functional activity Standing balance-Leahy Scale: Poor Standing balance comment: reliant on external support                            Cognition Arousal/Alertness: Awake/alert Behavior During Therapy: WFL for tasks assessed/performed Overall Cognitive Status: Within Functional Limits for tasks assessed                                        Exercises      General Comments        Pertinent Vitals/Pain Pain Assessment: Faces Faces Pain Scale: Hurts little more Pain Location: surgical site and swallowing Pain Descriptors / Indicators: Sore Pain Intervention(s): Monitored during session;Repositioned    Home Living  Prior Function            PT Goals (current goals can now be found in the care plan section) Progress towards PT goals: Progressing toward goals    Frequency    Min 5X/week      PT Plan Current plan remains appropriate    Co-evaluation              AM-PAC PT "6 Clicks" Mobility   Outcome Measure   Help needed turning from your back to your side while in a flat bed without using bedrails?: A Little Help needed moving from lying on your back to sitting on the side of a flat bed without using bedrails?: A Little Help needed moving to and from a bed to a chair (including a wheelchair)?: A Little Help needed standing up from a chair using your arms (e.g., wheelchair or bedside chair)?: A Little Help needed to walk in hospital room?: A Little Help needed climbing 3-5 steps with a railing? : Total 6 Click Score: 16    End of Session Equipment Utilized During Treatment: Gait belt Activity Tolerance: Patient tolerated treatment well Patient left: in chair;with call bell/phone within reach;with nursing/sitter in room Nurse Communication: Mobility status PT Visit Diagnosis: Unsteadiness on feet (R26.81);Other abnormalities of gait and mobility (R26.89);Pain Pain - part of body: (neck)     Time: 8757-9728 PT Time Calculation (min) (ACUTE ONLY): 30 min  Charges:  $Gait Training: 8-22 mins $Therapeutic Activity: 8-22 mins                     Wray Kearns, [place], DPT Acute Rehabilitation Services Pager [phone] Office [place] 03/29/2019, 11:52 AM

## 2019-03-29 NOTE — Progress Notes (Signed)
Patient ID: Teresa Barnes, female   DOB: 14-Sep-1946, 74 y.o.   MRN: [id number] Getting better, neck appropriately sore, no radic, feels RUE is getting stronger, made progress with PT/OT yesterday  R: grip 3/5, finger extension much improved, wrist 2/5, bicep 3/5, tricep 4-/5, deltoid 2/5  L: grip 4-/5, wrist 4-/5, bicep4-/5, deltoid 4-/5  Spastic on R  dressing dry and flat, voice strong, OOB to chair  Needs CIR to improve functional status

## 2019-03-29 NOTE — Progress Notes (Signed)
Pt admitted to 4W25. Pt alert and oriented. Pt's dinner was ordered and patient in bed with call bell in place. Continue plan of care.

## 2019-03-29 NOTE — Discharge Summary (Signed)
Physician Discharge Summary  Patient ID: Teresa Barnes MRN: [id number] DOB/AGE: 1946-01-23 73 y.o.  Admit date: 03/26/2019 Discharge date: 03/29/2019  Admission Diagnoses: incomplete SCI/ cervical stenosis with myelopathy    Discharge Diagnoses: same   Discharged Condition: stable  Hospital Course: The patient was admitted on 03/26/2019 and taken to the operating room where the patient underwent ACDF C3-4 C4-5 C5-6. The patient tolerated the procedure well and was taken to the recovery room and then to the floor in stable condition. The hospital course was routine. There were no complications. The wound remained clean dry and intact. Pt had appropriate neck soreness. No complaints of arm pain or new N/T/W. Her preop neuro status was slowly improving. The patient remained afebrile with stable vital signs, and tolerated a regular diet. The patient continued to increase activities, and pain was well controlled with oral pain medications.   Consults: rehabilitation medicine  Significant Diagnostic Studies:  Results for orders placed or performed during the hospital encounter of 03/26/19  Glucose, capillary  Result Value Ref Range   Glucose-Capillary 130 (H) 70 - 99 mg/dL  Glucose, capillary  Result Value Ref Range   Glucose-Capillary 159 (H) 70 - 99 mg/dL  Glucose, capillary  Result Value Ref Range   Glucose-Capillary 202 (H) 70 - 99 mg/dL  Glucose, capillary  Result Value Ref Range   Glucose-Capillary 161 (H) 70 - 99 mg/dL   Comment 1 Notify RN    Comment 2 Document in Chart   Glucose, capillary  Result Value Ref Range   Glucose-Capillary 206 (H) 70 - 99 mg/dL  Glucose, capillary  Result Value Ref Range   Glucose-Capillary 139 (H) 70 - 99 mg/dL  Glucose, capillary  Result Value Ref Range   Glucose-Capillary 190 (H) 70 - 99 mg/dL  Glucose, capillary  Result Value Ref Range   Glucose-Capillary 139 (H) 70 - 99 mg/dL   Comment 1 Notify RN    Comment 2 Document in Chart    Glucose, capillary  Result Value Ref Range   Glucose-Capillary 85 70 - 99 mg/dL  Glucose, capillary  Result Value Ref Range   Glucose-Capillary 107 (H) 70 - 99 mg/dL  Glucose, capillary  Result Value Ref Range   Glucose-Capillary 150 (H) 70 - 99 mg/dL  Glucose, capillary  Result Value Ref Range   Glucose-Capillary 111 (H) 70 - 99 mg/dL  Glucose, capillary  Result Value Ref Range   Glucose-Capillary 100 (H) 70 - 99 mg/dL  Glucose, capillary  Result Value Ref Range   Glucose-Capillary 147 (H) 70 - 99 mg/dL  Glucose, capillary  Result Value Ref Range   Glucose-Capillary 227 (H) 70 - 99 mg/dL    Chest 2 View  Result Date: 03/24/2019 CLINICAL DATA:  Preop evaluation for upcoming neck surgery EXAM: CHEST - 2 VIEW COMPARISON:  07/30/2016 FINDINGS: Cardiac shadows within normal limits. Mild aortic calcifications are seen. The lungs are clear. No acute bony abnormality is noted. Degenerative changes of the thoracic spine are seen. IMPRESSION: No active cardiopulmonary disease. Electronically Signed   By: Inez Catalina M.D.   On: 03/24/2019 18:19   Dg Cervical Spine 1 View  Result Date: 03/26/2019 CLINICAL DATA:  Surgical anterior fusion of C3-4, C4-5 and C5-6. EXAM: DG C-ARM 61-120 MIN; DG CERVICAL SPINE - 1 VIEW FLUOROSCOPY TIME:  22 seconds. COMPARISON:  MRI of March 18, 2019. FINDINGS: Single intraoperative fluoroscopic image of the cervical spine demonstrates the patient be status post surgical anterior fusion of C3-4, C4-5 and C5-6.  Good alignment of vertebral bodies is noted. IMPRESSION: Status post surgical anterior fusion of C3-4, C4-5 and C5-6. Electronically Signed   By: Marijo Conception M.D.   On: 03/26/2019 13:40   Mr Lumbar Spine Wo Contrast  Result Date: 03/10/2019 CLINICAL DATA:  Low back pain EXAM: MRI LUMBAR SPINE WITHOUT CONTRAST TECHNIQUE: Multiplanar, multisequence MR imaging of the lumbar spine was performed. No intravenous contrast was administered. COMPARISON:  MRI lumbar  spine 08/21/2017 FINDINGS: Segmentation:  Normal Alignment:  Normal Vertebrae:  Normal bone marrow.  Negative for fracture or mass. Conus medullaris and cauda equina: Conus extends to the L2 level. Conus and cauda equina appear normal. Small hyperintensity in the spinal cord at the T11 level seen only on sagittal images, no axial images through this lesion. However, there is a similar appearance on prior sagittal T2 images in 2018 and I feel this is a real finding. Probable area of chronic myelomalacia. No significant spinal stenosis or mass. Paraspinal and other soft tissues: Negative for paraspinous mass or soft tissue edema Disc levels: L1-2: Small central disc protrusion.  Negative for stenosis L2-3: Disc degeneration with diffuse disc bulging and bilateral facet hypertrophy. Small left foraminal disc protrusion without significant foraminal encroachment. No change from the prior study. L3-4: Mild disc degeneration and disc bulging. Mild facet degeneration. No disc protrusion or stenosis. L4-5: Mild disc degeneration with shallow central disc protrusion. Mild facet degeneration without significant stenosis L5-S1: Bilateral facet hypertrophy with mild subarticular stenosis bilaterally similar to the prior study. IMPRESSION: 1. Mild lumbar degenerative changes are similar to the prior MRI in 2018. No acute neural impingement 2. Small area of cord hyperintensity at the T11 level without masslike features. In retrospect, this was also present in 2018 and is most consistent with chronic myelomalacia. Electronically Signed   By: Franchot Gallo M.D.   On: 03/10/2019 13:37   Dg C-arm 1-60 Min  Result Date: 03/26/2019 CLINICAL DATA:  Surgical anterior fusion of C3-4, C4-5 and C5-6. EXAM: DG C-ARM 61-120 MIN; DG CERVICAL SPINE - 1 VIEW FLUOROSCOPY TIME:  22 seconds. COMPARISON:  MRI of March 18, 2019. FINDINGS: Single intraoperative fluoroscopic image of the cervical spine demonstrates the patient be status post  surgical anterior fusion of C3-4, C4-5 and C5-6. Good alignment of vertebral bodies is noted. IMPRESSION: Status post surgical anterior fusion of C3-4, C4-5 and C5-6. Electronically Signed   By: Marijo Conception M.D.   On: 03/26/2019 13:40    Antibiotics:  Anti-infectives (From admission, onward)   Start     Dose/Rate Route Frequency Ordered Stop   03/27/19 0600  ceFAZolin (ANCEF) IVPB 2g/100 mL premix  Status:  Discontinued     2 g 200 mL/hr over 30 Minutes Intravenous On call to O.R. 03/26/19 1545 03/27/19 0554   03/26/19 1545  ceFAZolin (ANCEF) IVPB 2g/100 mL premix     2 g 200 mL/hr over 30 Minutes Intravenous Every 8 hours 03/26/19 1543 03/27/19 0002   03/26/19 1116  bacitracin 50,000 Units in sodium chloride 0.9 % 500 mL irrigation  Status:  Discontinued       As needed 03/26/19 1116 03/26/19 1354      Discharge Exam: Blood pressure (!) 141/56, pulse 63, temperature 98.3 F (36.8 C), temperature source Oral, resp. rate 20, height 5\' 4"  (1.626 m), weight 83.8 kg, SpO2 99 %. Spastic R.L, weak R>L, hyperreflexic Dressing dry  Discharge Medications:   Allergies as of 03/29/2019      Reactions   Prednisone  Nausea And Vomiting   Tramadol Nausea Only      Medication List    TAKE these medications   allopurinol 100 MG tablet Commonly known as:  ZYLOPRIM Take 1 tablet (100 mg total) by mouth daily.   Combigan 0.2-0.5 % ophthalmic solution Generic drug:  brimonidine-timolol Place 1 drop into the left eye 2 (two) times a day.   Diclofenac Sodium 2 % Soln Commonly known as:  Pennsaid Apply 1 pump twice daily as needed. What changed:    how much to take  how to take this  when to take this  reasons to take this   folic acid 1 MG tablet Commonly known as:  FOLVITE Take 1 mg by mouth daily.   glipiZIDE 5 MG 24 hr tablet Commonly known as:  GLUCOTROL XL Take 5 mg by mouth daily as needed (ONLY TAKES FOR BLOOD SUGAR ABOVE 150).   lisinopril 5 MG tablet Commonly known  as:  ZESTRIL TAKE 1 TABLET BY MOUTH ONCE DAILY   lovastatin 40 MG tablet Commonly known as:  MEVACOR Take 1 tablet (40 mg total) by mouth at bedtime.   Lumigan 0.01 % Soln Generic drug:  bimatoprost Place 1 drop into both eyes at bedtime.   metFORMIN 1000 MG tablet Commonly known as:  GLUCOPHAGE TAKE 1 TABLET BY MOUTH TWICE DAILY What changed:  when to take this   NONFORMULARY OR COMPOUNDED ITEM Apply 1-2 g topically daily. [facility]  lacquer: Fluconazole 2%, Terbinafine 1%, DMSO   NovoFine Plus 32G X 4 MM Misc Generic drug:  Insulin Pen Needle Check sugars once per day. Dx: E11.9   omeprazole 40 MG capsule Commonly known as:  PRILOSEC TAKE 1 CAPSULE BY MOUTH ONCE DAILY What changed:    when to take this  reasons to take this   SUPER B COMPLEX/C PO Take 1 tablet by mouth daily.   Systane Balance 0.6 % Soln Generic drug:  Propylene Glycol Place 1 drop into the right eye 3 (three) times daily as needed (dry eye).   Victoza 18 MG/3ML Sopn Generic drug:  liraglutide Inject 0.3 mLs (1.8 mg total) into the skin daily.       Disposition: CIR   Final Dx: ACDF/ incomplete SCI/ myelopathy  Discharge Instructions    Diet - low sodium heart healthy   Complete by:  As directed    Increase activity slowly   Complete by:  As directed       Follow-up Information    Eustace Moore, MD. Schedule an appointment as soon as possible for a visit in 2 week(s).   Specialty:  Neurosurgery Contact information: [address] [place] [address] [place] [address] [phone]            Signed: Eustace Moore 03/29/2019, 2:12 PM

## 2019-03-29 NOTE — Progress Notes (Signed)
Teresa Staggers, MD  Physician  Physical Medicine and Rehabilitation  PMR Pre-admission  Signed  Date of Service:  03/29/2019 1:26 PM       Related encounter: Admission (Current) from 03/26/2019 in [place]         PMR Admission Coordinator Pre-Admission Assessment  Patient: Teresa Barnes is an 73 y.o., female MRN: [id number] DOB: 1946-04-19 Height: _0  (162.6 cm) Weight: 83.8 kg  Insurance Information HMO: Yes    PPO:      PCP:      IPA:      80/20:      OTHER:  PRIMARY: U[employer] Medicare      Policy#: [id number]      Subscriber: Patient CM Name: Auto-approved when case opened on 5/11 for admission 5/11; confirmed by Teresa Barnes      Phone#: [phone] ext [phone]     Fax#: [phone] Pre-Cert#: [id number]      Employer:  Auth auto-approval and confirmed via phone call by Teresa Barnes on 5/11 for admit to CIR on 5/11. Pt is approved for 3 days (5/11-5/13) with clinical updates due on day 3 (5/13) to Teresa Barnes (p): [phone] (f): [phone] Benefits:  Phone #: Online     Name: [email or web address] Eff. Date: 11/18/2018 still active     Deduct: $0      Out of Pocket Max: $3,600 (met $384.00 met)      Life Max: NA CIR: $295/day for days 1-5, $0/day for days 6+      SNF: $0/day for days 1-20, $160/day for days 21-43, $0/day for days 44-100; limited to 100 days per cal/yr Outpatient: limited by medical necessity     Co-Pay: $30/visit Home Health: 100%      Co-Pay: 0% DME: 80%     Co-Pay: 20% Providers:  SECONDARY: None      Policy#:       Subscriber:  CM Name:       Phone#:      Fax#:  Pre-Cert#:       Employer: Benefits:  Phone #:      Name:  Eff. Date:      Deduct:       Out of Pocket Max:       Life Max:  CIR:      SNF:  Outpatient:      Co-Pay:  Home Health:      Co-Pay:  DME:      Co-Pay:   Medicaid Application Date:       Case Manager:  Disability Application Date:       Case Worker:   The Data Collection Information Summary  for patients in Inpatient Rehabilitation Facilities with attached Privacy Act [place] Records was provided and verbally reviewed with: Patient  Emergency Contact Information         Contact Information    Name Relation Home Work Mobile   Teresa Barnes Spouse [phone]  [phone]   Teresa Barnes   [phone]      Current Medical History  Patient Admitting Diagnosis: incomplete SCI/cervical stenosis with myelopathy  History of Present Illness: Teresa Barnes is a 73 year old female with history of diabetes mellitus, esophageal stricture with dilatation, chronic anemia, breast cancer, gout, hypertension and hyperlipidemia. Pt presented 03/26/2019 with complaints of numbness of the arms, loss of strength and limited gait. Patient with noted fall 3 weeks ago. X-rays and imaging revealed critical cervical spinal stenosis C3-4, spinal stenosis C4-5, C5-6,  cervical myelopathy. Underwent decompressive anterior cervical discectomy C3-4, 4-5, 5-6 with anterior cervical arthrodesis and anterior cervical plating C3-C6 03/26/2019 per Dr. Sherley Bounds.No cervical collar needed. Hospital course pain management. Decadron protocol as indicated. Therapy evaluations completed with recommendations for CIR. Pt is to be admitted for a comprehensive rehab program on 03/29/19.  Patient's medical record from [facility] has been reviewed by the rehabilitation admission coordinator and physician.  Past Medical History      Past Medical History:  Diagnosis Date   Anemia    Anemia of chronic disease 09/24/2018   Anxiety    Blood transfusion without reported diagnosis 07/2016   Cancer [facility]) 1991   breast   Chronic gout 12/09/2017   On allopurinol   Diabetes mellitus without complication ([place])    Esophageal stricture 12/09/2017   S/p dilation by GI: Dr. Scarlette Shorts; due to h/o severe recurrent erosive esophagitis.   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  Prior Rehab/Hospitalizations Has the patient had prior rehab or hospitalizations prior to admission? No  Has the patient had major surgery during 100 days prior to admission? Yes             Current Medications  Current Facility-Administered Medications:    0.9 %  sodium chloride infusion, 250 mL, Intravenous, Continuous, Eustace Moore, MD   0.9 % NaCl with KCl 20 mEq/ L  infusion, , Intravenous, Continuous, Eustace Moore, MD, Stopped at 03/28/19 1950   acetaminophen (TYLENOL) tablet 650 mg, 650 mg, Oral, Q4H PRN, 650 mg at 03/28/19 1427 **OR** acetaminophen (TYLENOL) suppository 650 mg, 650 mg, Rectal, Q4H PRN, Eustace Moore, MD   allopurinol (ZYLOPRIM) tablet 100 mg, 100 mg, Oral, Daily, Eustace Moore, MD, 100 mg at 03/29/19 0925   brimonidine (ALPHAGAN) 0.2 % ophthalmic solution 1 drop, 1 drop, Left Eye, BID, 1 drop at 03/29/19 0928 **OR** timolol (TIMOPTIC) 0.5 % ophthalmic solution 1 drop, 1 drop, Left Eye, BID, Wynell Balloon, RPH, 1 drop at 03/28/19 1033   dexamethasone (DECADRON) injection 4 mg, 4 mg, Intravenous, Q6H, 4 mg at 03/29/19 0531 **OR** dexamethasone (DECADRON) tablet 4 mg, 4 mg, Oral, Q6H, Eustace Moore, MD, 4 mg at 73/22/02 5427   folic acid (FOLVITE) tablet 1 mg, 1 mg, Oral, Daily, Eustace Moore, MD, 1 mg at 03/29/19 0623   glipiZIDE (GLUCOTROL XL) 24 hr tablet 5 mg, 5 mg, Oral, Daily PRN, Eustace Moore, MD, 5 mg at 03/27/19 1024   insulin aspart (novoLOG) injection 0-15 Units, 0-15 Units, Subcutaneous, TID WC, Eustace Moore, MD,  5 Units at 03/29/19 1235   latanoprost (XALATAN) 0.005 % ophthalmic solution 1 drop, 1 drop, Both Eyes, QHS, Eustace Moore, MD, 1 drop at 03/28/19 2157   lisinopril (ZESTRIL) tablet 5 mg, 5 mg, Oral, Daily, Eustace Moore, MD, 5 mg at 03/29/19 0930   menthol-cetylpyridinium (CEPACOL) lozenge 3 mg, 1 lozenge, Oral, PRN **OR** phenol (CHLORASEPTIC) mouth spray 1 spray, 1 spray, Mouth/Throat, PRN, Eustace Moore, MD, 1 spray  at 03/26/19 1633   metFORMIN (GLUCOPHAGE) tablet 1,000 mg, 1,000 mg, Oral, BID, Eustace Moore, MD, 1,000 mg at 03/29/19 3559   methocarbamol (ROBAXIN) tablet 500 mg, 500 mg, Oral, Q6H PRN, 500 mg at 03/29/19 0925 **OR** methocarbamol (ROBAXIN) 500 mg in dextrose 5 % 50 mL IVPB, 500 mg, Intravenous, Q6H PRN, Eustace Moore, MD   morphine 2 MG/ML injection 2 mg, 2 mg, Intravenous, Q2H PRN, Eustace Moore, MD, 2 mg at 03/29/19 0057   ondansetron (ZOFRAN) tablet 4 mg, 4 mg, Oral, Q6H PRN **OR** ondansetron (ZOFRAN) injection 4 mg, 4 mg, Intravenous, Q6H PRN, Eustace Moore, MD   oxyCODONE (Oxy IR/ROXICODONE) immediate release tablet 5 mg, 5 mg, Oral, Q3H PRN, Eustace Moore, MD, 5 mg at 03/28/19 2201   senna (SENOKOT) tablet 8.6 mg, 1 tablet, Oral, BID, Eustace Moore, MD, 8.6 mg at 03/28/19 2159   sodium chloride flush (NS) 0.9 % injection 3 mL, 3 mL, Intravenous, Q12H, Eustace Moore, MD, 3 mL at 03/29/19 0929   sodium chloride flush (NS) 0.9 % injection 3 mL, 3 mL, Intravenous, PRN, Eustace Moore, MD  Patients Current Diet:     Diet Order                  Diet - low sodium heart healthy         Diet Carb Modified Fluid consistency: Thin; Room service appropriate? Yes with Assist  Diet effective now               Precautions / Restrictions Precautions Precautions: Cervical, Fall Precaution Booklet Issued: Yes (comment) Precaution Comments: reviewed precautions Restrictions Weight Bearing Restrictions: No   Has the patient had 2 or more  falls or a fall with injury in the past year? Yes  Prior Activity Level Limited Community (1-2x/wk): limited activity; Independent PTA without use of AD prior to fall  Prior Functional Level Self Care: Did the patient need help bathing, dressing, using the toilet or eating? Independent  Indoor Mobility: Did the patient need assistance with walking from room to room (with or without device)? Independent  Stairs: Did the patient need assistance with internal or external stairs (with or without device)? Independent  Functional Cognition: Did the patient need help planning regular tasks such as shopping or remembering to take medications? Independent  Home Assistive Devices / Equipment Home Assistive Devices/Equipment: None Home Equipment: Walker - 4 wheels, Bedside commode  Prior Device Use: Indicate devices/aids used by the patient prior to current illness, exacerbation or injury? None of the above  Current Functional Level Cognition  Overall Cognitive Status: Within Functional Limits for tasks assessed Orientation Level: Oriented X4 General Comments: Somewhat fearful and self limiting at times, but very responsive to education    Extremity Assessment (includes Sensation/Coordination)  Upper Extremity Assessment: RUE deficits/detail, LUE deficits/detail RUE Deficits / Details: increased edema, educated pt and nsg on importance of mobility of RUE and elevation; full AROM MCP, PIP, DIP digits 1,2,3,5;unable to fully extend PIP and DIP of 4th digit; 4/5 grip strength; 3-/5 elbow flexion and extension; decreased coordination, touched 1st digit to lips instead of nose;limited in use of RUE functionally, has been using L hand for brushing teeth and eating;built up handle of utensil and educated pt on importance of using RUE during functional tasks, RUE Sensation: decreased proprioception RUE Coordination: decreased fine motor, decreased gross motor LUE Deficits / Details: decreased  fine motor coordination, unable to remove lid from food items;assists  RUE with LUE;utilizes LUE to compensate for limited functional use of RUE;elbow flexion/extension 3/5, grip strength 3-/5; LUE Sensation: decreased proprioception LUE Coordination: decreased fine motor, decreased gross motor  Lower Extremity Assessment: Defer to PT evaluation RLE Deficits / Details: Strength 4/5 LLE Deficits / Details: Strength 5/5    ADLs  Overall ADL's : Needs assistance/impaired Eating/Feeding: Sitting, Minimal assistance Eating/Feeding Details (indicate cue type and reason): Min A to prep meal to optimize success. Pt donning built up handle onto fork. Pt reporting success with built up handle.  Grooming: Moderate assistance, Minimal assistance Upper Body Bathing: Moderate assistance, Sitting Lower Body Bathing: Moderate assistance Upper Body Dressing : Moderate assistance Lower Body Dressing: Moderate assistance, +2 for safety/equipment, Sit to/from stand( simulated to recliner) Lower Body Dressing Details (indicate cue type and reason): Mod A +2 for safety and to power up.  Toilet Transfer Details (indicate cue type and reason): did not attempt this session  Functional mobility during ADLs: Moderate assistance, +2 for physical assistance General ADL Comments: Pt highly motivated to participate in therapy. Performing functional mobility in room and then self feeding with built up handle    Mobility  Overal bed mobility: Needs Assistance Bed Mobility: Rolling, Sidelying to Sit Rolling: Min assist Sidelying to sit: Min assist, HOB elevated Sit to sidelying: Max assist General bed mobility comments: Cues for log roll technique, assist with rolling to reach for rail and to elevate trunk to sit upright.    Transfers  Overall transfer level: Needs assistance Equipment used: Rolling walker (2 wheeled) Transfers: Sit to/from Stand Sit to Stand: Min assist General transfer comment: Assist to  power to standing with cues for hand placement/technique. Stood from [employer], from chair x1.     Ambulation / Gait / Stairs / Wheelchair Mobility  Ambulation/Gait Ambulation/Gait assistance: Min assist, +2 safety/equipment Gait Distance (Feet): 16 Feet(+ 16') Assistive device: Rolling walker (2 wheeled) Gait Pattern/deviations: Step-to pattern, Step-through pattern, Decreased stride length, Decreased step length - left, Trunk flexed, Narrow base of support, Decreased weight shift to right General Gait Details: Slow, mildly unsteady gait with cues for upright posture. Tends to lean left but able to self correct with cues. Better placement of right foot today. 1 seated rest break. Gait velocity: decreased Gait velocity interpretation: <1.31 ft/sec, indicative of household ambulator    Posture / Balance Dynamic Sitting Balance Sitting balance - Comments: Supervision for static sitting balance Balance Overall balance assessment: Needs assistance Sitting-balance support: Feet supported, No upper extremity supported Sitting balance-Leahy Scale: Good Sitting balance - Comments: Supervision for static sitting balance Standing balance support: Bilateral upper extremity supported, During functional activity Standing balance-Leahy Scale: Poor Standing balance comment: reliant on external support    Special needs/care consideration BiPAP/CPAP : no CPM : no Continuous Drip IV : no Dialysis : no        Days : no Life Vest : no Oxygen : no Special Bed : no Trach Size : no Wound Vac (area) : no      Location : no Skin: closed surgical incision to neck                              Location: no Bowel mgmt: last BM: 03/26/19 Bladder mgmt: continent Diabetic mgmt: yes Behavioral consideration : no Chemo/radiation : no    Previous Home Environment (from acute therapy documentation) Living Arrangements: Spouse/significant other Available Help at Discharge: Family Type of Home: [place]  Layout: Able to live on main level with bedroom/bathroom Home Access: Stairs to enter Entrance Stairs-Rails: Left Entrance Stairs-Number of Steps: 3 Bathroom Shower/Tub: Multimedia programmer: [place]: No  Discharge Living Setting Plans for Discharge Living Setting: Patient's home, Lives with (comment)(husband) Type of Home at Discharge: House Discharge Home Layout: Two level, Able to live on main level with bedroom/bathroom Alternate Level Stairs-Rails: Left Alternate Level Stairs-Number of Steps: full flight Discharge Home Access: Stairs to enter Entrance Stairs-Rails: Right Entrance Stairs-Number of Steps: 3 Discharge Bathroom Shower/Tub: Tub only, Walk-in shower Discharge Bathroom Toilet: Standard(has BSC next to bed) Discharge Bathroom Accessibility: Yes How Accessible: Accessible via walker Does the patient have any problems obtaining your medications?: No  Social/Family/Support Systems Patient Roles: Spouse(has two grown children in [place]) [place]: husband: Herbie [place] (home: [phone]) (cell: [phone]) Anticipated Caregiver: husband Anticipated Caregiver's Contact Information: see above Ability/Limitations of Caregiver: Min A Caregiver Availability: 24/7 Discharge Plan Discussed with Primary Caregiver: Yes Is Caregiver In Agreement with Plan?: Yes Does Caregiver/Family have Issues with Lodging/Transportation while Pt is in Rehab?: No  Goals/Additional Needs Patient/Family Goal for Rehab: PT/OT: Mod I/Supervision; SLP: NA Expected length of stay: 7-10 Cultural Considerations: NA Dietary Needs: carb modified; thin fluids; calorie level 1600-2000; room service with assist Equipment Needs: TBD Pt/Family Agrees to Admission and willing to participate: Yes Program Orientation Provided & Reviewed with Pt/Caregiver Including Roles  & Responsibilities: Yes(pt and husband)  Barriers to Discharge: Home environment  access/layout  Barriers to Discharge Comments: steps to enter  Decrease burden of Care through IP rehab admission: NA  Possible need for SNF placement upon discharge: Not anticipated; pt has good social support at DC through her spouse and has a good prognosis for further progress through CIR.   Patient Condition: I have reviewed medical records from [facility], spoken with RN and MD, and patient and spouse. I met with patient at the bedside for inpatient rehabilitation assessment.  Patient will benefit from ongoing PT and OT, can actively participate in 3 hours of therapy a day 5 days of the week, and can make measurable gains during the admission.  Patient will also benefit from the coordinated team approach during an Inpatient Acute Rehabilitation admission.  The patient will receive intensive therapy as well as Rehabilitation physician, nursing, social worker, and care management interventions.  Due to bladder management, bowel management, safety, skin/wound care, disease management, medication administration, pain management and patient education the patient requires 24 hour a day rehabilitation nursing.  The patient is currently Min A +2 with mobility and Min A to Mod A+2 for basic ADLs.  Discharge setting and therapy post discharge at home with home health is anticipated.  Patient has agreed to participate in the Acute Inpatient Rehabilitation Program and will admit today.  Preadmission Screen Completed By:  Jhonnie Garner, 03/29/2019 3:24 PM ______________________________________________________________________   Discussed status with Dr. Naaman Plummer on 03/29/19 at 3:24PM and received approval for admission today.  Admission Coordinator:  Jhonnie Garner, OT, time 3:24PM/Date 03/29/19   Assessment/Plan: Diagnosis: cervical stenosis with myelopathy 1. Does the need for close, 24 hr/day Medical supervision in concert with the patient's rehab needs make it unreasonable for this patient  to be served in a less intensive setting? Yes 2. Co-Morbidities requiring supervision/potential complications: DM, breast ca, htn 3. Due to bladder management, bowel management, safety, skin/wound care, disease management, medication administration, pain management and patient education, does the patient require 24 hr/day rehab nursing? Yes 4. Does  the patient require coordinated care of a physician, rehab nurse, PT (1-2 hrs/day, 5 days/week) and OT (1-2 hrs/day, 5 days/week) to address physical and functional deficits in the context of the above medical diagnosis(es)? Yes Addressing deficits in the following areas: balance, endurance, locomotion, strength, transferring, bowel/bladder control, bathing, dressing, feeding, grooming, toileting and psychosocial support 5. Can the patient actively participate in an intensive therapy program of at least 3 hrs of therapy 5 days a week? Yes 6. The potential for patient to make measurable gains while on inpatient rehab is excellent 7. Anticipated functional outcomes upon discharge from inpatients are: modified independent and supervision PT, modified independent and supervision OT, n/a SLP 8. Estimated rehab length of stay to reach the above functional goals is: 7-11 days 9. Anticipated D/C setting: Home 10. Anticipated post D/C treatments: [place] therapy 11. Overall Rehab/Functional Prognosis: excellent  MD Signature: Teresa Staggers, MD, [facility] 03/29/2019         Revision History    Date/Time User Provider Type Action  03/29/2019 3:38 PM Teresa Staggers, MD Physician Sign  03/29/2019 3:30 PM Jhonnie Garner, [place] [facility] Admission Coordinator Share  03/29/2019 3:29 PM Jhonnie Garner, [place] [facility] Admission Coordinator Share  View Details Report

## 2019-03-29 NOTE — Care Management Important Message (Signed)
Important Message  Patient Details  Name: Teresa Barnes MRN: [id number] Date of Birth: [birth year]   Medicare Important Message Given:  Yes    Teresa Barnes 03/29/2019, 4:04 PM

## 2019-03-29 NOTE — Progress Notes (Signed)
Inpatient Rehab Admissions:  Inpatient Rehab Consult received.  I met with pt at the bedside for rehabilitation assessment and to discuss goals and expectations of an inpatient rehab admission.  Pt interested in CIR at this time and would like to pursue. AC will need to confirm caregiver support and check insurance benefits. Will follow up prior to initiating insurance authorization process after confirmation of information.   Jhonnie Garner, OTR/L  Rehab Admissions Coordinator  [phone] 03/29/2019 10:42 AM

## 2019-03-29 NOTE — H&P (Signed)
Physical Medicine and Rehabilitation Admission H&P     Chief complaint: Weakness HPI: Teresa Barnes is a 73 year old right-handed female with history of diabetes mellitus, esophageal stricture with dilatation, chronic anemia, breast cancer, gout, hypertension and hyperlipidemia.  Per chart review patient lives with spouse.  Two-level home with bedroom on Main level.  Patient was independent until approximately 3 weeks ago needing physical assistance for limited household ambulation.  Presented 03/26/2019 with complaints of numbness of the arms, loss of strength and limited gait.  Patient with noted fall 3 weeks ago.  X-rays and imaging revealed critical cervical spinal stenosis C3-4, spinal stenosis C4-5, C5-6, cervical myelopathy.  Underwent decompressive anterior cervical discectomy C3-4, 4-5, 5-6 with anterior cervical arthrodesis and anterior cervical plating C3-C6 03/26/2019 per Dr. Sherley Bounds.  No cervical collar needed.  Hospital course pain management.  Decadron protocol as indicated.  Therapy evaluations completed and patient was admitted for a comprehensive rehab program.   Review of Systems  Constitutional: Negative for fever.  HENT: Negative for hearing loss.   Eyes: Negative for blurred vision.  Respiratory: Negative for cough.   Cardiovascular: Negative for chest pain.  Gastrointestinal: Positive for nausea and vomiting.  Genitourinary: Positive for dysuria.  Musculoskeletal: Negative for myalgias.  Skin: Negative for rash.  Neurological: Positive for dizziness, weakness and headaches.  Psychiatric/Behavioral: Negative for depression.        Past Medical History:  Diagnosis Date  . Anemia    . Anemia of chronic disease 09/24/2018  . Anxiety    . Blood transfusion without reported diagnosis 07/2016  . Cancer ([place]) 1991    breast  . Chronic gout 12/09/2017    On allopurinol  . Diabetes mellitus without complication ([place])    . Esophageal stricture 12/09/2017    S/p  dilation by GI: Dr. Scarlette Shorts; due to h/o severe recurrent erosive esophagitis.  . 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 GERD (gastroesophageal reflux disease)    . Glaucoma    . Hiatal hernia 12/09/2017  . Hyperlipidemia    . Hypertension    . Right rotator cuff tear 11/24/2015    Injected this and 26 2017  . Wears glasses           Past Surgical History:  Procedure Laterality Date  . CATARACT EXTRACTION W/ INTRAOCULAR LENS  IMPLANT, BILATERAL   2000  . COLONOSCOPY      . cyst on spine      . ESOPHAGOGASTRODUODENOSCOPY (EGD) WITH PROPOFOL N/A 07/31/2016    Procedure: ESOPHAGOGASTRODUODENOSCOPY (EGD) WITH PROPOFOL;  Surgeon: Manus Gunning, MD;  Location: [facility];  Service: Gastroenterology;  Laterality: N/A;  . EYE SURGERY   2011    lt eye blled  . MASTECTOMY MODIFIED RADICAL Right 1990  . REDUCTION MAMMAPLASTY Left    . TRIGGER FINGER RELEASE Right 06/24/2013    Procedure: RELEASE TRIGGER FINGER/A-1 PULLEY PIP RIGHT LONG FINGER;  Surgeon: Cammie Sickle., MD;  Location: [place];  Service: Orthopedics;  Laterality: Right;  . TRIGGER FINGER RELEASE      . TUBAL LIGATION             Family History  Problem Relation Age of Onset  . Diabetes Mother    . Diabetes Daughter    . Arthritis Daughter    . Hyperlipidemia Daughter    . Diabetes Son    . Arthritis Son    .  Diabetes Sister    . Colon cancer Neg Hx    . Cancer Neg Hx    . Heart disease Neg Hx    . Breast cancer Neg Hx      Social History:  reports that she quit smoking about 25 years ago. Her smoking use included cigarettes. She has a 2.50 pack-year smoking history. She has never used smokeless tobacco. She reports that she does not drink alcohol or use drugs. Allergies:      Allergies  Allergen Reactions  . Prednisone Nausea And Vomiting  . Tramadol Nausea Only          Medications Prior to Admission  Medication Sig Dispense Refill  .  allopurinol (ZYLOPRIM) 100 MG tablet Take 1 tablet (100 mg total) by mouth daily. 90 tablet 3  . bimatoprost (LUMIGAN) 0.01 % SOLN Place 1 drop into both eyes at bedtime.       . COMBIGAN 0.2-0.5 % ophthalmic solution Place 1 drop into the left eye 2 (two) times a day.    3  . Diclofenac Sodium (PENNSAID) 2 % SOLN Apply 1 pump twice daily as needed. (Patient taking differently: Apply 1 application topically 2 (two) times daily as needed (PAIN). Apply 1 pump twice daily as needed.) 563 g 3  . folic acid (FOLVITE) 1 MG tablet Take 1 mg by mouth daily.      Marland Kitchen glipiZIDE (GLUCOTROL XL) 5 MG 24 hr tablet Take 5 mg by mouth daily as needed (ONLY TAKES FOR BLOOD SUGAR ABOVE 150).      Marland Kitchen lisinopril (PRINIVIL,ZESTRIL) 5 MG tablet TAKE 1 TABLET BY MOUTH ONCE DAILY 90 tablet 0  . lovastatin (MEVACOR) 40 MG tablet Take 1 tablet (40 mg total) by mouth at bedtime. 90 tablet 3  . metFORMIN (GLUCOPHAGE) 1000 MG tablet TAKE 1 TABLET BY MOUTH TWICE DAILY (Patient taking differently: Take 1,000 mg by mouth 2 (two) times daily with a meal. ) 180 tablet 1  . NOVOFINE PLUS 32G X 4 MM MISC Check sugars once per day. Dx: E11.9 100 each 11  . omeprazole (PRILOSEC) 40 MG capsule TAKE 1 CAPSULE BY MOUTH ONCE DAILY (Patient taking differently: Take 40 mg by mouth daily as needed (HEARTBURN). ) 30 capsule 2  . Propylene Glycol (SYSTANE BALANCE) 0.6 % SOLN Place 1 drop into the right eye 3 (three) times daily as needed (dry eye).      . SUPER B COMPLEX/C PO Take 1 tablet by mouth daily.       Marland Kitchen VICTOZA 18 MG/3ML SOPN Inject 0.3 mLs (1.8 mg total) into the skin daily. 9 pen 3  . NONFORMULARY OR COMPOUNDED ITEM Apply 1-2 g topically daily. [facility]  lacquer: Fluconazole 2%, Terbinafine 1%, DMSO (Patient not taking: Reported on 03/22/2019) 120 each 3      Drug Regimen Review Drug regimen was reviewed and remains appropriate with no significant issues identified   Home: Home Living Family/patient expects to be  discharged to:: Private residence Living Arrangements: Spouse/significant other Available Help at Discharge: Family Type of Home: House Home Access: Stairs to enter Technical brewer of Steps: 3 Entrance Stairs-Rails: Left Home Layout: Able to live on main level with bedroom/bathroom Bathroom Shower/Tub: Multimedia programmer: Turner: Environmental consultant - 4 wheels, Bedside commode   Functional History: Prior Function Level of Independence: Needs assistance Gait / Transfers Assistance Needed: last 3 weeks has been needing physical assistance for limited household walking ADL's / Homemaking Assistance Needed: last 3 weeks,  needing assist for all ADL's. Unable to wash her hair   Functional Status:  Mobility: Bed Mobility Overal bed mobility: Needs Assistance Bed Mobility: Rolling, Sidelying to Sit, Sit to Sidelying Rolling: Min guard Sidelying to sit: Min assist Sit to sidelying: Max assist General bed mobility comments: Min assist for trunk elevation Transfers Overall transfer level: Needs assistance Equipment used: Rolling walker (2 wheeled) Transfers: Sit to/from Stand Sit to Stand: Mod assist General transfer comment: Cues for hand positiong (right hand on walker, left hand pushing up from bed) Ambulation/Gait Ambulation/Gait assistance: Min assist, +2 physical assistance, +2 safety/equipment Gait Distance (Feet): 10 Feet Assistive device: Rolling walker (2 wheeled) Gait Pattern/deviations: Step-to pattern, Step-through pattern, Decreased stride length, Decreased step length - left, Trunk flexed, Narrow base of support General Gait Details: Verbal and visual cues for upright posture, wider BOS, step length, keeping toes straight forward. Somewhat erratic R foot placement due to decreased proprioception Gait velocity: decreased Gait velocity interpretation: <1.31 ft/sec, indicative of household ambulator   ADL: ADL Overall ADL's : Needs  assistance/impaired Eating/Feeding: Sitting, Minimal assistance Eating/Feeding Details (indicate cue type and reason): Min A to prep meal to optimize success. Pt donning built up handle onto fork. Pt reporting success with built up handle.  Grooming: Moderate assistance, Minimal assistance Upper Body Bathing: Moderate assistance, Sitting Lower Body Bathing: Moderate assistance Upper Body Dressing : Moderate assistance Lower Body Dressing: Moderate assistance, +2 for safety/equipment, Sit to/from stand( simulated to recliner) Lower Body Dressing Details (indicate cue type and reason): Mod A +2 for safety and to power up.  Toilet Transfer Details (indicate cue type and reason): did not attempt this session  Functional mobility during ADLs: Moderate assistance, +2 for physical assistance General ADL Comments: Pt highly motivated to participate in therapy. Performing functional mobility in room and then self feeding with built up handle   Cognition: Cognition Overall Cognitive Status: Within Functional Limits for tasks assessed Orientation Level: Oriented X4 Cognition Arousal/Alertness: Awake/alert Behavior During Therapy: WFL for tasks assessed/performed Overall Cognitive Status: Within Functional Limits for tasks assessed General Comments: Somewhat fearful and self limiting at times, but very responsive to education   Physical Exam: Blood pressure (!) 115/50, pulse 63, temperature 98.1 F (36.7 C), resp. rate 20, height 5\' 4"  (1.626 m), weight 83.8 kg, SpO2 98 %. Physical Exam  Constitutional: She is oriented to person, place, and time. She appears well-developed and well-nourished.  HENT:  Head: Normocephalic and atraumatic.  Eyes: Pupils are equal, round, and reactive to light. EOM are normal.  Neck: Normal range of motion. No tracheal deviation present. No thyromegaly present.  Cardiovascular: Normal rate and regular rhythm. Exam reveals no gallop and no friction rub.  No murmur  heard. Respiratory: Effort normal and breath sounds normal. No respiratory distress. She has no wheezes. She has no rales.  GI: Soft. She exhibits no distension. There is no abdominal tenderness. There is no rebound.  Musculoskeletal: Normal range of motion.        General: No edema.     Comments: Right shoulder with limited PROM---45-55 abduction, some associated pain  Neurological: She is alert and oriented to person, place, and time. No cranial nerve deficit.  Normal insight and awareness, RUE 2/5 prox to 3- to 3/5 distally. LUE 3/5 prox to 3+/5 distally.  LE 4/5 prox to distal. Decreased sensation R>L to LT/pain. DTR's 1+ UE and 2+ LE.   Skin: Skin is warm. No erythema.  Neck incision CDI with steri-strips.   Psychiatric:  She has a normal mood and affect. Her behavior is normal. Judgment and thought content normal.      Lab Results Last 48 Hours  Results for orders placed or performed during the hospital encounter of 03/26/19 (from the past 48 hour(s))  Glucose, capillary     Status: Abnormal    Collection Time: 03/27/19 11:38 AM  Result Value Ref Range    Glucose-Capillary 139 (H) 70 - 99 mg/dL  Glucose, capillary     Status: Abnormal    Collection Time: 03/27/19  4:25 PM  Result Value Ref Range    Glucose-Capillary 190 (H) 70 - 99 mg/dL  Glucose, capillary     Status: Abnormal    Collection Time: 03/27/19  9:22 PM  Result Value Ref Range    Glucose-Capillary 139 (H) 70 - 99 mg/dL    Comment 1 Notify RN      Comment 2 Document in Chart    Glucose, capillary     Status: None    Collection Time: 03/28/19  8:37 AM  Result Value Ref Range    Glucose-Capillary 85 70 - 99 mg/dL  Glucose, capillary     Status: Abnormal    Collection Time: 03/28/19 12:23 PM  Result Value Ref Range    Glucose-Capillary 107 (H) 70 - 99 mg/dL  Glucose, capillary     Status: Abnormal    Collection Time: 03/28/19  4:29 PM  Result Value Ref Range    Glucose-Capillary 150 (H) 70 - 99 mg/dL  Glucose,  capillary     Status: Abnormal    Collection Time: 03/28/19  9:22 PM  Result Value Ref Range    Glucose-Capillary 111 (H) 70 - 99 mg/dL  Glucose, capillary     Status: Abnormal    Collection Time: 03/28/19 11:14 PM  Result Value Ref Range    Glucose-Capillary 100 (H) 70 - 99 mg/dL  Glucose, capillary     Status: Abnormal    Collection Time: 03/29/19  8:41 AM  Result Value Ref Range    Glucose-Capillary 147 (H) 70 - 99 mg/dL      Imaging Results (Last 48 hours)  No results found.           Medical Problem List and Plan: 1.  Decreased functional mobility secondary to cervical stenosis with myelopathy status post decompressive anterior cervical discectomy 03/26/2019.  No cervical collar needed             -admit to inpatient rehab 2.  Antithrombotics: -DVT/anticoagulation: SCDs.  Check vascular study             -antiplatelet therapy: N/A 3. Pain Management: Oxycodone and Robaxin as needed 4. Mood: Provide emotional support             -antipsychotic agents: N/A 5. Neuropsych: This patient is capable of making decisions on his own behalf. 6. Skin/Wound Care: Routine skin checks 7. Fluids/Electrolytes/Nutrition: Routine in and outs with follow-up chemistries             -encouraged to maintain PO intake despite nausea 8.  Diabetes mellitus with peripheral neuropathy.  Hemoglobin A1c 6.6.  SSI, Glucophage 1000 mg twice daily, Glucotrol XL 5 mg daily if blood sugar greater than 150.  Check blood sugars before meals and at bedtime 9.  Hypertension.  Lisinopril 5 mg daily 10.  Constipation/neurogenic bowel.               -establish daily bowel program        Post  Admission Physician Evaluation: 1. Functional deficits secondary  to cervical stenosis with myelopathy. 2. Patient is admitted to receive collaborative, interdisciplinary care between the physiatrist, rehab nursing staff, and therapy team. 3. Patient's level of medical complexity and substantial therapy needs in context of  that medical necessity cannot be provided at a lesser intensity of care such as a SNF. 4. Patient has experienced substantial functional loss from his/her baseline which was documented above under the "Functional History" and "Functional Status" headings.  Judging by the patient's diagnosis, physical exam, and functional history, the patient has potential for functional progress which will result in measurable gains while on inpatient rehab.  These gains will be of substantial and practical use upon discharge  in facilitating mobility and self-care at the household level. 5. Physiatrist will provide 24 hour management of medical needs as well as oversight of the therapy plan/treatment and provide guidance as appropriate regarding the interaction of the two. 6. The Preadmission Screening has been reviewed and patient status is unchanged unless otherwise stated above. 7. 24 hour rehab nursing will assist with bladder management, bowel management, safety, skin/wound care, disease management, medication administration, pain management and patient education  and help integrate therapy concepts, techniques,education, etc. 8. PT will assess and treat for/with: Lower extremity strength, range of motion, stamina, balance, functional mobility, safety, adaptive techniques and equipment.   Goals are: mod I to supervision. 9. OT will assess and treat for/with: ADL's, functional mobility, safety, upper extremity strength, adaptive techniques and equipment .   Goals are: mod I to supervision. Therapy may proceed with showering this patient. 10. SLP will assess and treat for/with: n/a.  Goals are: n/a. 11. Case Management and Social Worker will assess and treat for psychological issues and discharge planning. 12. Team conference will be held weekly to assess progress toward goals and to determine barriers to discharge. 13. Patient will receive at least 3 hours of therapy per day at least 5 days per week. 14. ELOS: 7-11  days       15. Prognosis:  excellent   [name], MD, [facility] 03/29/2019       Lavon Paganini [place], PA-C 03/29/2019

## 2019-03-29 NOTE — PMR Pre-admission (Signed)
PMR Admission Coordinator Pre-Admission Assessment  Patient: Teresa Barnes is an 73 y.o., female MRN: [id number] DOB: 1946/04/03 Height: _0  (162.6 cm) Weight: 83.8 kg  Insurance Information HMO: Yes    PPO:      PCP:      IPA:      80/20:      OTHER:  PRIMARY: [employer] Medicare      Policy#: [id number]      Subscriber: Patient CM Name: Auto-approved when case opened on 5/11 for admission 5/11; confirmed by Jardeen      Phone#: [phone] ext [phone]     Fax#: [phone] Pre-Cert#: [id number]      Employer:  Auth auto-approval and confirmed via phone call by Birdena Jubilee on 5/11 for admit to CIR on 5/11. Pt is approved for 3 days (5/11-5/13) with clinical updates due on day 3 (5/13) to Dorthula Nettles (p): [phone] (f): [phone] Benefits:  Phone #: Online     Name: [email or web address] Eff. Date: 11/18/2018 still active     Deduct: $0      Out of Pocket Max: $3,600 (met $384.00 met)      Life Max: NA CIR: $295/day for days 1-5, $0/day for days 6+      SNF: $0/day for days 1-20, $160/day for days 21-43, $0/day for days 44-100; limited to 100 days per cal/yr Outpatient: limited by medical necessity     Co-Pay: $30/visit Home Health: 100%      Co-Pay: 0% DME: 80%     Co-Pay: 20% Providers:  SECONDARY: None      Policy#:       Subscriber:  CM Name:       Phone#:      Fax#:  Pre-Cert#:       Employer: Benefits:  Phone #:      Name:  Eff. Date:      Deduct:       Out of Pocket Max:       Life Max:  CIR:      SNF:  Outpatient:      Co-Pay:  Home Health:      Co-Pay:  DME:      Co-Pay:   Medicaid Application Date:       Case Manager:  Disability Application Date:       Case Worker:   The "Data Collection Information Summary" for patients in Inpatient Rehabilitation Facilities with attached "Privacy Act [place] Records" was provided and verbally reviewed with: Patient  Emergency Contact Information Contact Information    Name Relation Home Work Mobile   Chesmore,Robert Spouse  [phone]  [phone]   ERNESTENE, COOVER   [phone]      Current Medical History  Patient Admitting Diagnosis: incomplete SCI/cervical stenosis with myelopathy  History of Present Illness: Teresa Barnes is a 73 year old female with history of diabetes mellitus, esophageal stricture with dilatation, chronic anemia, breast cancer, gout, hypertension and hyperlipidemia. Pt presented 03/26/2019 with complaints of numbness of the arms, loss of strength and limited gait.  Patient with noted fall 3 weeks ago.  X-rays and imaging revealed critical cervical spinal stenosis C3-4, spinal stenosis C4-5, C5-6, cervical myelopathy.  Underwent decompressive anterior cervical discectomy C3-4, 4-5, 5-6 with anterior cervical arthrodesis and anterior cervical plating C3-C6 03/26/2019 per Dr. Sherley Bounds.  No cervical collar needed.  Hospital course pain management.  Decadron protocol as indicated.  Therapy evaluations completed with recommendations for CIR. Pt is to be admitted for a comprehensive rehab program on 03/29/19.  Patient's medical record from [facility] has been reviewed by the rehabilitation admission coordinator and physician.  Past Medical History  Past Medical History:  Diagnosis Date  . Anemia   . Anemia of chronic disease 09/24/2018  . Anxiety   . Blood transfusion without reported diagnosis 07/2016  . Cancer ([place]) 1991   breast  . Chronic gout 12/09/2017   On allopurinol  . Diabetes mellitus without complication ([place])   . Esophageal stricture 12/09/2017   S/p dilation by GI: Dr. Scarlette Shorts; due to h/o severe recurrent erosive esophagitis.  . 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 GERD (gastroesophageal reflux disease)   . Glaucoma   . Hiatal hernia 12/09/2017  . Hyperlipidemia   . Hypertension   . Right rotator cuff tear 11/24/2015   Injected this and 26 2017  . Wears glasses     Family History   family history includes  Arthritis in her daughter and son; Diabetes in her daughter, mother, sister, and son; Hyperlipidemia in her daughter.  Prior Rehab/Hospitalizations Has the patient had prior rehab or hospitalizations prior to admission? No  Has the patient had major surgery during 100 days prior to admission? Yes   Current Medications  Current Facility-Administered Medications:  .  0.9 %  sodium chloride infusion, 250 mL, Intravenous, Continuous, Eustace Moore, MD .  0.9 % NaCl with KCl 20 mEq/ L  infusion, , Intravenous, Continuous, Eustace Moore, MD, Stopped at 03/28/19 1950 .  acetaminophen (TYLENOL) tablet 650 mg, 650 mg, Oral, Q4H PRN, 650 mg at 03/28/19 1427 **OR** acetaminophen (TYLENOL) suppository 650 mg, 650 mg, Rectal, Q4H PRN, Eustace Moore, MD .  allopurinol (ZYLOPRIM) tablet 100 mg, 100 mg, Oral, Daily, Eustace Moore, MD, 100 mg at 03/29/19 0925 .  brimonidine (ALPHAGAN) 0.2 % ophthalmic solution 1 drop, 1 drop, Left Eye, BID, 1 drop at 03/29/19 0928 **OR** timolol (TIMOPTIC) 0.5 % ophthalmic solution 1 drop, 1 drop, Left Eye, BID, Wynell Balloon, RPH, 1 drop at 03/28/19 1033 .  dexamethasone (DECADRON) injection 4 mg, 4 mg, Intravenous, Q6H, 4 mg at 03/29/19 0531 **OR** dexamethasone (DECADRON) tablet 4 mg, 4 mg, Oral, Q6H, Eustace Moore, MD, 4 mg at 03/29/19 1235 .  folic acid (FOLVITE) tablet 1 mg, 1 mg, Oral, Daily, Eustace Moore, MD, 1 mg at 03/29/19 0925 .  glipiZIDE (GLUCOTROL XL) 24 hr tablet 5 mg, 5 mg, Oral, Daily PRN, Eustace Moore, MD, 5 mg at 03/27/19 1024 .  insulin aspart (novoLOG) injection 0-15 Units, 0-15 Units, Subcutaneous, TID WC, Eustace Moore, MD, 5 Units at 03/29/19 1235 .  latanoprost (XALATAN) 0.005 % ophthalmic solution 1 drop, 1 drop, Both Eyes, QHS, Eustace Moore, MD, 1 drop at 03/28/19 2157 .  lisinopril (ZESTRIL) tablet 5 mg, 5 mg, Oral, Daily, Eustace Moore, MD, 5 mg at 03/29/19 0930 .  menthol-cetylpyridinium (CEPACOL) lozenge 3 mg, 1 lozenge, Oral, PRN  **OR** phenol (CHLORASEPTIC) mouth spray 1 spray, 1 spray, Mouth/Throat, PRN, Eustace Moore, MD, 1 spray at 03/26/19 1633 .  metFORMIN (GLUCOPHAGE) tablet 1,000 mg, 1,000 mg, Oral, BID, Eustace Moore, MD, 1,000 mg at 03/29/19 0925 .  methocarbamol (ROBAXIN) tablet 500 mg, 500 mg, Oral, Q6H PRN, 500 mg at 03/29/19 0925 **OR** methocarbamol (ROBAXIN) 500 mg in dextrose 5 % 50 mL IVPB, 500 mg, Intravenous, Q6H PRN, Eustace Moore, MD .  morphine 2 MG/ML injection 2 mg,  2 mg, Intravenous, Q2H PRN, Eustace Moore, MD, 2 mg at 03/29/19 0057 .  ondansetron (ZOFRAN) tablet 4 mg, 4 mg, Oral, Q6H PRN **OR** ondansetron (ZOFRAN) injection 4 mg, 4 mg, Intravenous, Q6H PRN, Eustace Moore, MD .  oxyCODONE (Oxy IR/ROXICODONE) immediate release tablet 5 mg, 5 mg, Oral, Q3H PRN, Eustace Moore, MD, 5 mg at 03/28/19 2201 .  senna (SENOKOT) tablet 8.6 mg, 1 tablet, Oral, BID, Eustace Moore, MD, 8.6 mg at 03/28/19 2159 .  sodium chloride flush (NS) 0.9 % injection 3 mL, 3 mL, Intravenous, Q12H, Eustace Moore, MD, 3 mL at 03/29/19 0929 .  sodium chloride flush (NS) 0.9 % injection 3 mL, 3 mL, Intravenous, PRN, Eustace Moore, MD  Patients Current Diet:  Diet Order            Diet - low sodium heart healthy        Diet Carb Modified Fluid consistency: Thin; Room service appropriate? Yes with Assist  Diet effective now              Precautions / Restrictions Precautions Precautions: Cervical, Fall Precaution Booklet Issued: Yes (comment) Precaution Comments: reviewed precautions Restrictions Weight Bearing Restrictions: No   Has the patient had 2 or more falls or a fall with injury in the past year? Yes  Prior Activity Level Limited Community (1-2x/wk): limited activity; Independent PTA without use of AD prior to fall  Prior Functional Level Self Care: Did the patient need help bathing, dressing, using the toilet or eating? Independent  Indoor Mobility: Did the patient need assistance with  walking from room to room (with or without device)? Independent  Stairs: Did the patient need assistance with internal or external stairs (with or without device)? Independent  Functional Cognition: Did the patient need help planning regular tasks such as shopping or remembering to take medications? Independent  Home Assistive Devices / Equipment Home Assistive Devices/Equipment: None Home Equipment: Walker - 4 wheels, Bedside commode  Prior Device Use: Indicate devices/aids used by the patient prior to current illness, exacerbation or injury? None of the above  Current Functional Level Cognition  Overall Cognitive Status: Within Functional Limits for tasks assessed Orientation Level: Oriented X4 General Comments: Somewhat fearful and self limiting at times, but very responsive to education    Extremity Assessment (includes Sensation/Coordination)  Upper Extremity Assessment: RUE deficits/detail, LUE deficits/detail RUE Deficits / Details: increased edema, educated pt and nsg on importance of mobility of RUE and elevation; full AROM MCP, PIP, DIP digits 1,2,3,5;unable to fully extend PIP and DIP of 4th digit; 4/5 grip strength; 3-/5 elbow flexion and extension; decreased coordination, touched 1st digit to lips instead of nose;limited in use of RUE functionally, has been using L hand for brushing teeth and eating;built up handle of utensil and educated pt on importance of using RUE during functional tasks, RUE Sensation: decreased proprioception RUE Coordination: decreased fine motor, decreased gross motor LUE Deficits / Details: decreased fine motor coordination, unable to remove lid from food items;assists RUE with LUE;utilizes LUE to compensate for limited functional use of RUE;elbow flexion/extension 3/5, grip strength 3-/5; LUE Sensation: decreased proprioception LUE Coordination: decreased fine motor, decreased gross motor  Lower Extremity Assessment: Defer to PT evaluation RLE  Deficits / Details: Strength 4/5 LLE Deficits / Details: Strength 5/5    ADLs  Overall ADL's : Needs assistance/impaired Eating/Feeding: Sitting, Minimal assistance Eating/Feeding Details (indicate cue type and reason): Min A to prep meal to optimize success. Pt donning  built up handle onto fork. Pt reporting success with built up handle.  Grooming: Moderate assistance, Minimal assistance Upper Body Bathing: Moderate assistance, Sitting Lower Body Bathing: Moderate assistance Upper Body Dressing : Moderate assistance Lower Body Dressing: Moderate assistance, +2 for safety/equipment, Sit to/from stand( simulated to recliner) Lower Body Dressing Details (indicate cue type and reason): Mod A +2 for safety and to power up.  Toilet Transfer Details (indicate cue type and reason): did not attempt this session  Functional mobility during ADLs: Moderate assistance, +2 for physical assistance General ADL Comments: Pt highly motivated to participate in therapy. Performing functional mobility in room and then self feeding with built up handle    Mobility  Overal bed mobility: Needs Assistance Bed Mobility: Rolling, Sidelying to Sit Rolling: Min assist Sidelying to sit: Min assist, HOB elevated Sit to sidelying: Max assist General bed mobility comments: Cues for log roll technique, assist with rolling to reach for rail and to elevate trunk to sit upright.    Transfers  Overall transfer level: Needs assistance Equipment used: Rolling walker (2 wheeled) Transfers: Sit to/from Stand Sit to Stand: Min assist General transfer comment: Assist to power to standing with cues for hand placement/technique. Stood from [employer], from chair x1.     Ambulation / Gait / Stairs / Wheelchair Mobility  Ambulation/Gait Ambulation/Gait assistance: Min assist, +2 safety/equipment Gait Distance (Feet): 16 Feet(+ 16') Assistive device: Rolling walker (2 wheeled) Gait Pattern/deviations: Step-to pattern, Step-through  pattern, Decreased stride length, Decreased step length - left, Trunk flexed, Narrow base of support, Decreased weight shift to right General Gait Details: Slow, mildly unsteady gait with cues for upright posture. Tends to lean left but able to self correct with cues. Better placement of right foot today. 1 seated rest break. Gait velocity: decreased Gait velocity interpretation: <1.31 ft/sec, indicative of household ambulator    Posture / Balance Dynamic Sitting Balance Sitting balance - Comments: Supervision for static sitting balance Balance Overall balance assessment: Needs assistance Sitting-balance support: Feet supported, No upper extremity supported Sitting balance-Leahy Scale: Good Sitting balance - Comments: Supervision for static sitting balance Standing balance support: Bilateral upper extremity supported, During functional activity Standing balance-Leahy Scale: Poor Standing balance comment: reliant on external support    Special needs/care consideration BiPAP/CPAP : no CPM : no Continuous Drip IV : no Dialysis : no        Days : no Life Vest : no Oxygen : no Special Bed : no Trach Size : no Wound Vac (area) : no      Location : no Skin: closed surgical incision to neck                              Location: no Bowel mgmt: last BM: 03/26/19 Bladder mgmt: continent Diabetic mgmt: yes Behavioral consideration : no Chemo/radiation : no    Previous Home Environment (from acute therapy documentation) Living Arrangements: Spouse/significant other Available Help at Discharge: Family Type of Home: House Home Layout: Able to live on main level with bedroom/bathroom Home Access: Stairs to enter Entrance Stairs-Rails: Left Entrance Stairs-Number of Steps: 3 Bathroom Shower/Tub: Multimedia programmer: Standard Home Care Services: No  Discharge Living Setting Plans for Discharge Living Setting: Patient's home, Lives with (comment)(husband) Type of Home at  Discharge: House Discharge Home Layout: Two level, Able to live on main level with bedroom/bathroom Alternate Level Stairs-Rails: Left Alternate Level Stairs-Number of Steps: full flight Discharge Home  Access: Stairs to enter Entrance Stairs-Rails: Right Entrance Stairs-Number of Steps: 3 Discharge Bathroom Shower/Tub: Tub only, Walk-in shower Discharge Bathroom Toilet: Standard(has BSC next to bed) Discharge Bathroom Accessibility: Yes How Accessible: Accessible via walker Does the patient have any problems obtaining your medications?: No  Social/Family/Support Systems Patient Roles: Spouse(has two grown children in [place]) Contact Information: husband: Herbie [place] (home: [phone]) (cell: [phone]) Anticipated Caregiver: husband Anticipated Caregiver's Contact Information: see above Ability/Limitations of Caregiver: Min A Caregiver Availability: 24/7 Discharge Plan Discussed with Primary Caregiver: Yes Is Caregiver In Agreement with Plan?: Yes Does Caregiver/Family have Issues with Lodging/Transportation while Pt is in Rehab?: No  Goals/Additional Needs Patient/Family Goal for Rehab: PT/OT: Mod I/Supervision; SLP: NA Expected length of stay: 7-10 Cultural Considerations: NA Dietary Needs: carb modified; thin fluids; calorie level 1600-2000; room service with assist Equipment Needs: TBD Pt/Family Agrees to Admission and willing to participate: Yes Program Orientation Provided & Reviewed with Pt/Caregiver Including Roles  & Responsibilities: Yes(pt and husband)  Barriers to Discharge: Home environment access/layout  Barriers to Discharge Comments: steps to enter  Decrease burden of Care through IP rehab admission: NA  Possible need for SNF placement upon discharge: Not anticipated; pt has good social support at DC through her spouse and has a good prognosis for further progress through CIR.   Patient Condition: I have reviewed medical records from [facility], spoken with RN and MD, and patient and spouse. I met with patient at the bedside for inpatient rehabilitation assessment.  Patient will benefit from ongoing PT and OT, can actively participate in 3 hours of therapy a day 5 days of the week, and can make measurable gains during the admission.  Patient will also benefit from the coordinated team approach during an Inpatient Acute Rehabilitation admission.  The patient will receive intensive therapy as well as Rehabilitation physician, nursing, social worker, and care management interventions.  Due to bladder management, bowel management, safety, skin/wound care, disease management, medication administration, pain management and patient education the patient requires 24 hour a day rehabilitation nursing.  The patient is currently Min A +2 with mobility and Min A to Mod A+2 for basic ADLs.  Discharge setting and therapy post discharge at home with home health is anticipated.  Patient has agreed to participate in the Acute Inpatient Rehabilitation Program and will admit today.  Preadmission Screen Completed By:  Jhonnie Garner, 03/29/2019 3:24 PM ______________________________________________________________________   Discussed status with Dr. Naaman Plummer on 03/29/19 at 3:24PM and received approval for admission today.  Admission Coordinator:  Jhonnie Garner, OT, time 3:24PM/Date 03/29/19   Assessment/Plan: Diagnosis: cervical stenosis with myelopathy 1. Does the need for close, 24 hr/day Medical supervision in concert with the patient's rehab needs make it unreasonable for this patient to be served in a less intensive setting? Yes 2. Co-Morbidities requiring supervision/potential complications: DM, breast ca, htn 3. Due to bladder management, bowel management, safety, skin/wound care, disease management, medication administration, pain management and patient education, does the patient require 24 hr/day rehab nursing? Yes 4. Does the patient require coordinated  care of a physician, rehab nurse, PT (1-2 hrs/day, 5 days/week) and OT (1-2 hrs/day, 5 days/week) to address physical and functional deficits in the context of the above medical diagnosis(es)? Yes Addressing deficits in the following areas: balance, endurance, locomotion, strength, transferring, bowel/bladder control, bathing, dressing, feeding, grooming, toileting and psychosocial support 5. Can the patient actively participate in an intensive therapy program of at least 3 hrs of therapy 5 days a week?  Yes 6. The potential for patient to make measurable gains while on inpatient rehab is excellent 7. Anticipated functional outcomes upon discharge from inpatients are: modified independent and supervision PT, modified independent and supervision OT, n/a SLP 8. Estimated rehab length of stay to reach the above functional goals is: 7-11 days 9. Anticipated D/C setting: Home 10. Anticipated post D/C treatments: [facility] therapy 11. Overall Rehab/Functional Prognosis: excellent  MD Signature: [name], MD, [facility] 03/29/2019

## 2019-03-29 NOTE — Progress Notes (Signed)
Inpatient Rehabilitation-Admissions Coordinator   [facility] has received insurance approval and medical approval by Dr. Ronnald Ramp for admit to CIR today. AC has updated pt pt and her husband who are on board for admitted to CIR today. AC will update RN, CM/SW regarding plans.   Please call if questions.   Jhonnie Garner, OTR/L  Rehab Admissions Coordinator  [phone] 03/29/2019 2:11 PM

## 2019-03-30 ENCOUNTER — Inpatient Hospital Stay (HOSPITAL_COMMUNITY): Payer: Medicare Other | Admitting: Physical Therapy

## 2019-03-30 ENCOUNTER — Inpatient Hospital Stay (HOSPITAL_COMMUNITY): Payer: Medicare Other

## 2019-03-30 DIAGNOSIS — K592 Neurogenic bowel, not elsewhere classified: Secondary | ICD-10-CM

## 2019-03-30 DIAGNOSIS — M7989 Other specified soft tissue disorders: Secondary | ICD-10-CM

## 2019-03-30 LAB — COMPREHENSIVE METABOLIC PANEL
ALT: 10 U/L (ref 0–44)
AST: 12 U/L — ABNORMAL LOW (ref 15–41)
Albumin: 3.1 g/dL — ABNORMAL LOW (ref 3.5–5.0)
Alkaline Phosphatase: 43 U/L (ref 38–126)
Anion gap: 10 (ref 5–15)
BUN: 28 mg/dL — ABNORMAL HIGH (ref 8–23)
CO2: 23 mmol/L (ref 22–32)
Calcium: 9.3 mg/dL (ref 8.9–10.3)
Chloride: 104 mmol/L (ref 98–111)
Creatinine, Ser: 1.11 mg/dL — ABNORMAL HIGH (ref 0.44–1.00)
GFR calc Af Amer: 57 mL/min — ABNORMAL LOW (ref >60)
GFR calc non Af Amer: 49 mL/min — ABNORMAL LOW (ref >60)
Glucose, Bld: 253 mg/dL — ABNORMAL HIGH (ref 70–99)
Potassium: 4.6 mmol/L (ref 3.5–5.1)
Sodium: 137 mmol/L (ref 135–145)
Total Bilirubin: 0.7 mg/dL (ref 0.3–1.2)
Total Protein: 6.5 g/dL (ref 6.5–8.1)

## 2019-03-30 LAB — CBC WITH DIFFERENTIAL/PLATELET
Abs Immature Granulocytes: 0.05 10*3/uL (ref 0.00–0.07)
Basophils Absolute: 0 10*3/uL (ref 0.0–0.1)
Basophils Relative: 0 %
Eosinophils Absolute: 0 10*3/uL (ref 0.0–0.5)
Eosinophils Relative: 0 %
HCT: 27.8 % — ABNORMAL LOW (ref 36.0–46.0)
Hemoglobin: 9.4 g/dL — ABNORMAL LOW (ref 12.0–15.0)
Immature Granulocytes: 1 %
Lymphocytes Relative: 10 %
Lymphs Abs: 0.8 10*3/uL (ref 0.7–4.0)
MCH: 30.6 pg (ref 26.0–34.0)
MCHC: 33.8 g/dL (ref 30.0–36.0)
MCV: 90.6 fL (ref 80.0–100.0)
Monocytes Absolute: 0.3 10*3/uL (ref 0.1–1.0)
Monocytes Relative: 4 %
Neutro Abs: 6.7 10*3/uL (ref 1.7–7.7)
Neutrophils Relative %: 85 %
Platelets: 339 10*3/uL (ref 150–400)
RBC: 3.07 MIL/uL — ABNORMAL LOW (ref 3.87–5.11)
RDW: 13.5 % (ref 11.5–15.5)
WBC: 7.9 10*3/uL (ref 4.0–10.5)
nRBC: 0 % (ref 0.0–0.2)

## 2019-03-30 LAB — GLUCOSE, CAPILLARY
Glucose-Capillary: 232 mg/dL — ABNORMAL HIGH (ref 70–99)
Glucose-Capillary: 226 mg/dL — ABNORMAL HIGH (ref 70–99)
Glucose-Capillary: 128 mg/dL — ABNORMAL HIGH (ref 70–99)
Glucose-Capillary: 133 mg/dL — ABNORMAL HIGH (ref 70–99)

## 2019-03-30 MED ORDER — MUSCLE RUB 10-15 % EX CREA
TOPICAL_CREAM | Freq: Two times a day (BID) | CUTANEOUS | Status: DC
  Administered 2019-03-30 – 2019-04-12 (×20): via TOPICAL
  Administered 2019-04-12: 1 via TOPICAL
  Administered 2019-04-13: 09:00:00 via TOPICAL
  Filled 2019-03-30: qty 85

## 2019-03-30 MED ORDER — BISACODYL 10 MG RE SUPP
10.0000 mg | Freq: Every day | RECTAL | Status: DC | PRN
  Administered 2019-03-30: 20:00:00 10 mg via RECTAL
  Filled 2019-03-30: qty 1

## 2019-03-30 MED ORDER — DEXAMETHASONE 4 MG PO TABS
4.0000 mg | ORAL_TABLET | Freq: Three times a day (TID) | ORAL | Status: DC
  Administered 2019-03-30 – 2019-04-05 (×18): 4 mg via ORAL
  Filled 2019-03-30 (×18): qty 1

## 2019-03-30 MED ORDER — FLEET ENEMA 7-19 GM/118ML RE ENEM
1.0000 | ENEMA | Freq: Every day | RECTAL | Status: DC | PRN
  Administered 2019-04-07: 1 via RECTAL
  Filled 2019-03-30: qty 1

## 2019-03-30 MED ORDER — SENNOSIDES-DOCUSATE SODIUM 8.6-50 MG PO TABS
2.0000 | ORAL_TABLET | Freq: Every day | ORAL | Status: DC
  Administered 2019-03-31 – 2019-04-12 (×10): 2 via ORAL
  Filled 2019-03-30 (×13): qty 2

## 2019-03-30 NOTE — Progress Notes (Signed)
Inpatient Rehabilitation  Patient information reviewed and entered into eRehab system by Acsa Estey M. Symeon Puleo, M.A., CCC/SLP, PPS Coordinator.  Information including medical coding, functional ability and quality indicators will be reviewed and updated through discharge.    

## 2019-03-30 NOTE — Progress Notes (Signed)
LE venous duplex       has been completed. Preliminary results can be found under CV proc through chart review. Raquelle Pietro, BS, RDMS, RVT   

## 2019-03-30 NOTE — Progress Notes (Addendum)
Physical Therapy Session Note  Patient Details  Name: Teresa Barnes MRN: [id number] Date of Birth: 10-23-1946  Today's Date: 03/30/2019 PT Individual Time: 7681-1572 PT Individual Time Calculation (min): 55 min   Short Term Goals: Week 1:  PT Short Term Goal 1 (Week 1): Pt will consistently complete bed mobility with min assist without hospital bed features.  PT Short Term Goal 2 (Week 1): Pt will consistently complete transfers with min assist & LRAD. PT Short Term Goal 3 (Week 1): Pt will ambulate 50 ft with LRAD & min assist.  PT Short Term Goal 4 (Week 1): Pt will negotiate 4 steps with R rail to simulate home entry with mod assist.   Skilled Therapeutic Interventions/Progress Updates:  Pt received in w/c & agreeable to tx. No c/o pain reported. Pt reports need to use restroom and is transported in/out of bathroom in w/c dependent assist. Pt transfers w/c<>toilet with min assist for stand pivot but mod assist for sit>stand from low toilet, with grab bars with therapist providing total assist for clothing management. Pt with continent void on toilet and performs peri hygiene in standing.  Pt performs hand hygiene at sink from w/c level with assistance to obtain soap & paper towels. In gym, pt transfers sit<>Stand with mod assist, cuing for technique and assistance with managing RUE on hand orthosis. Pt ambulates 25 ft + 25 ft with RW & mod assist with pt demonstrating ability to recall cuing from previous session. Pt continues to demonstrate impaired weight shifting L<>R, decreased step length BLE & decreased stride length, decreased foot clearance & heel strike BLE, & impaired coordination BLE. Provided pt with 18x18 w/c for improved positioning/posture when OOB. Pt reports fatigue & is returned to room. Pt ambulates in room to bed with RW & mod assist as noted above. Pt transfers sit>sidelying with mod assist for BLE and cuing for log rolling 2/2 cervical precautions.  At end of session pt left  in bed with alarm set & needs at hand.   Therapy Documentation Precautions:  Precautions Precautions: Cervical, Fall Precaution Booklet Issued: Yes (comment) Precaution Comments: reviewed precautions Restrictions Weight Bearing Restrictions: No      Therapy/Group: Individual Therapy  [name] 03/30/2019, 2:33 PM

## 2019-03-30 NOTE — Progress Notes (Signed)
[place] PHYSICAL MEDICINE & REHABILITATION PROGRESS NOTE   Subjective/Complaints: Had a fair night. Was able to rest. A little anxious about today. Worried about "falling", right arm is tight  ROS: Patient denies fever, rash, sore throat, blurred vision, nausea, vomiting, diarrhea, cough, shortness of breath or chest pain, joint or back pain, headache, or mood change.    Objective:   No results found. Recent Labs    03/30/19 0638  WBC 7.9  HGB 9.4*  HCT 27.8*  PLT 339   Recent Labs    03/30/19 0638  NA 137  K 4.6  CL 104  CO2 23  GLUCOSE 253*  BUN 28*  CREATININE 1.11*  CALCIUM 9.3    Intake/Output Summary (Last 24 hours) at 03/30/2019 0915 Last data filed at 03/30/2019 0846 Gross per 24 hour  Intake 120 ml  Output 750 ml  Net -630 ml     Physical Exam: Vital Signs Blood pressure (!) 143/51, pulse 62, temperature 97.7 F (36.5 C), temperature source Oral, resp. rate 16, height 5\' 4"  (1.626 m), weight 80.7 kg, SpO2 100 %. Constitutional: No distress . Vital signs reviewed. HEENT: EOMI, oral membranes moist Neck: supple, surgical wound cdi with steristrips Cardiovascular: RRR without murmur. No JVD    Respiratory: CTA Bilaterally without wheezes or rales. Normal effort    GI: BS +, non-tender, non-distended  Musculoskeletal:Normal range of motion.  General: tr to 1+ edema RUE.  Comments: Right shoulder with limited PROM---45-55 abduction, limited rotation with associated pain Neurological: She is alertand oriented to person, place, and time. Nocranial nerve deficit. Normal insight and awareness, RUE 2+/5 prox to 3+/5 distally. LUE 3/5 prox to 3+ to 4-/5 distally. LE 4/5 prox to distal. Decreased sensation R>L to LT/pain. DTR's 1+ UE and 2+ LE.  Skin: Skin iswarm. Noerythema. Neck incision CDI with steri-strips. Psychiatric:pleasant a little anxious   Assessment/Plan: 1. Functional deficits secondary to cervical myelopathy which  require 3+ hours per day of interdisciplinary therapy in a comprehensive inpatient rehab setting.  Physiatrist is providing close team supervision and 24 hour management of active medical problems listed below.  Physiatrist and rehab team continue to assess barriers to discharge/monitor patient progress toward functional and medical goals  Care Tool:  Bathing        Body parts bathed by helper: Front perineal area, Buttocks     Bathing assist Assist Level: Total Assistance - Patient < 25%     Upper Body Dressing/Undressing Upper body dressing   What is the patient wearing?: Hospital gown only    Upper body assist      Lower Body Dressing/Undressing Lower body dressing      What is the patient wearing?: Hospital gown only     Lower body assist       Toileting Toileting    Toileting assist Assist for toileting: Moderate Assistance - Patient 50 - 74%     Transfers Chair/bed transfer  Transfers assist           Locomotion Ambulation   Ambulation assist              Walk 10 feet activity   Assist           Walk 50 feet activity   Assist           Walk 150 feet activity   Assist           Walk 10 feet on uneven surface  activity   Assist  Wheelchair     Assist               Wheelchair 50 feet with 2 turns activity    Assist            Wheelchair 150 feet activity     Assist          Medical Problem List and Plan: 1.Decreased functional mobilitysecondary to cervical stenosis with myelopathy status with weakness in a central cord pattern. She is status post decompressive anterior cervical discectomy 03/26/2019.No cervical collar needed -Patient is beginning CIR therapies today including PT and OT   -adhesive capsulitis right shoulder---aggressive ROM with therapy   -alnalgesic balm   -consider injection 2. Antithrombotics: -DVT/anticoagulation:SCDs. Check  vascular study -antiplatelet therapy: N/A 3. Pain Management:Oxycodone and Robaxin as needed 4. Mood:Provide emotional support -antipsychotic agents: N/A 5. Neuropsych: This patientiscapable of making decisions on hisown behalf. 6. Skin/Wound Care:Routine skin checks 7. Fluids/Electrolytes/Nutrition: encourage PO  I personally reviewed the patient's labs today.    -BUN sl elevated--push liquids  -protein for low albumin 8.Diabetes mellitus with peripheral neuropathy. Hemoglobin A1c 6.6. SSI, Glucophage 1000 mg twice daily, Glucotrol XL 5 mg daily if blood sugar greater than 150.    -decadron taper 9. Hypertension. Lisinopril 5 mg daily 10. Constipation/neurogenic bowel. -establishing  dailybowel program    LOS: 1 days A FACE TO FACE EVALUATION WAS PERFORMED  [name] 03/30/2019, 9:15 AM

## 2019-03-30 NOTE — Evaluation (Addendum)
Physical Therapy Assessment and Plan  Patient Details  Name: Teresa Barnes MRN: [id number] Date of Birth: May 09, 1946  PT Diagnosis: Abnormal posture, Abnormality of gait, Coordination disorder, Difficulty walking, Muscle weakness and Pain in neck Rehab Potential: Good ELOS: 2-2.5 weeks   Today's Date: 03/30/2019 PT Individual Time: 2542-7062 PT Individual Time Calculation (min): 57 min    Problem List:  Patient Active Problem List   Diagnosis Date Noted  . Cervical myelopathy ([place]) 03/29/2019  . S/P cervical spinal fusion 03/26/2019  . Anemia of chronic disease 09/24/2018  . Piriformis syndrome of right side 09/10/2018  . Adhesive bursitis of left shoulder 08/05/2018  . History of right breast cancer 12/09/2017  . Mixed hyperlipidemia 12/09/2017  . Hypertension associated with diabetes ([place]) 12/09/2017  . Hiatal hernia 12/09/2017  . Esophageal stricture 12/09/2017  . Chronic gout 12/09/2017  . Glaucoma 12/09/2017  . Greater trochanteric bursitis of right hip 11/17/2017  . Degenerative arthritis of right knee 07/03/2017  . Normochromic anemia   . H/O: upper GI bleed 07/30/2016  . Right lumbar radiculopathy 01/23/2016  . Arthritis of right acromioclavicular joint 01/02/2016  . Controlled type 2 diabetes mellitus without complication, without long-term current use of insulin ([place]) 04/03/2009    Past Medical History:  Past Medical History:  Diagnosis Date  . Anemia   . Anemia of chronic disease 09/24/2018  . Anxiety   . Blood transfusion without reported diagnosis 07/2016  . Cancer ([place]) 1991   breast  . Chronic gout 12/09/2017   On allopurinol  . Diabetes mellitus without complication ([place])   . Esophageal stricture 12/09/2017   S/p dilation by GI: Dr. Scarlette Shorts; due to h/o severe recurrent erosive esophagitis.  . 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 GERD (gastroesophageal reflux disease)   . Glaucoma   . Hiatal hernia 12/09/2017  .  Hyperlipidemia   . Hypertension   . Right rotator cuff tear 11/24/2015   Injected this and 26 2017  . Wears glasses    Past Surgical History:  Past Surgical History:  Procedure Laterality Date  . ANTERIOR CERVICAL DECOMP/DISCECTOMY FUSION N/A 03/26/2019   Procedure: Anterior Cervical Discectomy Fusion - Cervical Three-Cervical Four - Cervical Four-Cervical Five - Cervical Five-Cervical Six;  Surgeon: Eustace Moore, MD;  Location: [facility];  Service: Neurosurgery;  Laterality: N/A;  Anterior Cervical Discectomy Fusion - Cervical Three-Cervical Four - Cervical Four-Cervical Five - Cervical Five-Cervical Six  . CATARACT EXTRACTION W/ INTRAOCULAR LENS  IMPLANT, BILATERAL  2000  . COLONOSCOPY    . cyst on spine    . ESOPHAGOGASTRODUODENOSCOPY (EGD) WITH PROPOFOL N/A 07/31/2016   Procedure: ESOPHAGOGASTRODUODENOSCOPY (EGD) WITH PROPOFOL;  Surgeon: Manus Gunning, MD;  Location: [facility];  Service: Gastroenterology;  Laterality: N/A;  . EYE SURGERY  2011   lt eye blled  . MASTECTOMY MODIFIED RADICAL Right 1990  . REDUCTION MAMMAPLASTY Left   . TRIGGER FINGER RELEASE Right 06/24/2013   Procedure: RELEASE TRIGGER FINGER/A-1 PULLEY PIP RIGHT LONG FINGER;  Surgeon: Cammie Sickle., MD;  Location: [facility];  Service: Orthopedics;  Laterality: Right;  . TRIGGER FINGER RELEASE    . TUBAL LIGATION      Assessment & Plan Clinical Impression: Patient is a 73 y.o. year old female with history of diabetes mellitus, esophageal stricture with dilatation, chronic anemia, breast cancer, gout, hypertension and hyperlipidemia. Per chart review patient lives with spouse. Two-level home with bedroom on Main level. Patient  was independent until approximately 3 weeks ago needing physical assistance for limited household ambulation. Presented 03/26/2019 with complaints of numbness of the arms, loss of strength and limited gait. Patient with noted fall 3 weeks ago. X-rays and imaging revealed  critical cervical spinal stenosis C3-4, spinal stenosis C4-5, C5-6, cervical myelopathy. Underwent decompressive anterior cervical discectomy C3-4, 4-5, 5-6 with anterior cervical arthrodesis and anterior cervical plating C3-C6 03/26/2019 per Dr. Sherley Bounds.No cervical collar needed. Hospital course pain management. Decadron protocol as indicated. Therapy evaluations completed and patient was admitted for a comprehensive rehab program..  Patient transferred to CIR on 03/29/2019 .   Patient currently requires max with mobility secondary to muscle weakness, decreased cardiorespiratoy endurance, decreased coordination, and decreased standing balance, decreased postural control, decreased balance strategies and difficulty maintaining precautions.  Prior to hospitalization, patient was independent  with mobility and lived with Spouse in a House home.  Home access is 3Stairs to enter.  Patient will benefit from skilled PT intervention to maximize safe functional mobility, minimize fall risk and decrease caregiver burden for planned discharge home with 24 hour supervision.  Anticipate patient will benefit from follow up [place] at discharge.  PT - End of Session Activity Tolerance: Tolerates 30+ min activity with multiple rests Endurance Deficit: Yes Endurance Deficit Description: generalized deconditioning PT Assessment Rehab Potential (ACUTE/IP ONLY): Good PT Patient demonstrates impairments in the following area(s): Balance;Motor;Safety;Behavior;Pain;Skin Integrity;Endurance PT Transfers Functional Problem(s): Bed Mobility;Bed to Chair;Furniture;Car PT Locomotion Functional Problem(s): Ambulation;Wheelchair Mobility;Stairs PT Plan PT Intensity: Minimum of 1-2 x/day ,45 to 90 minutes PT Frequency: 5 out of 7 days PT Duration Estimated Length of Stay: 2-2.5 weeks PT Treatment/Interventions: Ambulation/gait training;Discharge planning;DME/adaptive equipment instruction;Functional mobility  training;Cognitive remediation/compensation;Pain management;Psychosocial support;Therapeutic Activities;Splinting/orthotics;UE/LE Strength taining/ROM;Visual/perceptual remediation/compensation;Wheelchair propulsion/positioning;UE/LE Coordination activities;Therapeutic Exercise;Stair training;Skin care/wound management;Patient/family education;Neuromuscular re-education;Functional electrical stimulation;Disease management/prevention;Community reintegration;Balance/vestibular training PT Transfers Anticipated Outcome(s): supervision with LRAD PT Locomotion Anticipated Outcome(s): supervision with LRAD, except CGA for stair negotiation PT Recommendation Recommendations for Other Services: Neuropsych consult;Therapeutic Recreation consult Therapeutic Recreation Interventions: Stress management Follow Up Recommendations: 24 hour supervision/assistance;Home health PT Patient destination: Home Equipment Recommended: To be determined  Skilled Therapeutic Intervention Patient received in bed & agreeable to tx. PT evaluation initiated with therapist educating pt on ELOS, daily therapy schedule, weekly team meetings, safety plan in room (call for assistance when wanting to transfer OOB or w/c), and other CIR information. Provided pt with w/c cushion to prevent skin breakdown when OOB. Pt transfers supine>sitting EOB with mod assist, HOB elevated, and bed rails. Pt transfers sit>stand and stand pivot bed>w/c with mod assist. Pt attempts to propel w/c with BUE but unable to sufficiently use RUE & pt requires max assist for steering x 5 ft. In dayroom, pt ambulates 10 ft without AD & max assist with impaired gait pattern as noted below & L lateral lean. Provided pt with RW & R hand orthosis and pt ambulates additional 12 ft with mod assist overall and cuing for hand placement for sit<>stand transfers and assistance with managing RUE on hand orthosis. At end of session pt left sitting in w/c in room with chair alarm  donned & all needs in reach.   Addendum: educated pt re cervical precautions.  PT Evaluation Precautions/Restrictions Precautions Precautions: Cervical;Fall Restrictions Weight Bearing Restrictions: No   General Chart Reviewed: Yes Additional Pertinent History: DM, anemia, breast CA, gout, HTN, HLD, anxiety, R frozen shoulder, glaucoma, hiatal ernia, R rotator cuff tear Response to Previous Treatment: Patient reporting fatigue but able to participate. Family/Caregiver Present:  No   Pain Pt reports unrated, "Just a little" neck pain - meds requested.    Home Living/Prior Functioning Home Living Available Help at Discharge: Family Type of Home: House Home Access: Stairs to enter Technical brewer of Steps: 3 Entrance Stairs-Rails: R ascending  Home Layout: Able to live on main level with bedroom/bathroom Bathroom Shower/Tub: Multimedia programmer: Standard  Lives With: Spouse Prior Function Level of Independence: Independent with basic ADLs;Independent with gait;Independent with transfers Driving: Yes Vocation: Retired   Art gallery manager: Within [facility]: Intact   Cognition Overall Cognitive Status: Within Functional Limits for tasks assessed Oriented x 4 Arousal/Alertness: Awake/alert Attention: Selective Selective Attention: Appears intact Memory: Appears intact Awareness: Appears intact Problem Solving: Appears intact Safety/Judgment: Appears intact   Sensation Sensation Light Touch: Appears Intact(BLE intact) Proprioception: Appears Intact(BLE) Additional Comments: Impaired sensation in BUE Coordination Gross Motor Movements are Fluid and Coordinated: No Fine Motor Movements are Fluid and Coordinated: No Coordination and Movement Description: deconditioning and cervical induced weakness/sensation  Motor  Motor Motor: Abnormal postural alignment and control Motor - Skilled Clinical  Observations: generalized deconditioning   Mobility Bed Mobility Bed Mobility: Supine to Sit Supine to Sit: Moderate Assistance - Patient 50-74%(Bed rails, HOB elevated) Transfers Transfers: Sit to [employer] Transfers Sit to Stand: Moderate Assistance - Patient 50-74% Stand to Sit: Moderate Assistance - Patient 50-74% Stand Pivot Transfers: Moderate Assistance - Patient 50 - 74% Stand Pivot Transfer Details: Verbal cues for sequencing;Verbal cues for precautions/safety Transfer (Assistive device): None   Locomotion  Gait Ambulation: Yes Gait Assistance: Maximal Assistance - Patient 25-49% Gait Distance (Feet): 10 Feet Assistive device: None Gait Gait: Yes Gait Pattern: Decreased step length - right;Decreased step length - left;Decreased stride length;Decreased hip/knee flexion - right;Decreased hip/knee flexion - left;Decreased dorsiflexion - right;Decreased dorsiflexion - left;Decreased weight shift to right;Shuffle(L lateral lean) Gait velocity: decreased Stairs / Additional Locomotion Stairs: No Wheelchair Mobility Wheelchair Mobility: Yes Wheelchair Assistance: Maximal Assistance - Patient 25 - 49% Wheelchair Propulsion: Both upper extremities(great difficulty using RUE) Wheelchair Parts Management: Needs assistance Distance: 5 ft    Trunk/Postural Assessment  Cervical Assessment Cervical Assessment: Exceptions to WFL(s/p cervical sx) Thoracic Assessment Thoracic Assessment: Within Functional Limits Postural Control Postural Control: Deficits on evaluation Righting Reactions: delayed   Balance Balance Balance Assessed: Yes Dynamic Standing Balance Dynamic Standing - Balance Support: No upper extremity supported Dynamic Standing - Level of Assistance: 2: Max assist Dynamic Standing - Balance Activities: (gait without AD) Dynamic Standing - Comments: L lateral lean   Extremity Assessment  BUE assessment per OT report: RUE Assessment RUE Assessment:  Exceptions to [facility] General Strength Comments: MMT not completed d/t cervical precautions; 11 lbs RUE Body System: Ortho RUE AROM (degrees) Right Shoulder Extension: 50 Degrees Right Shoulder Flexion: 20 Degrees Right Shoulder ABduction: 40 Degrees Right Shoulder Horizontal ABduction: 30 Degrees Right Shoulder Horizontal  ADduction: 110 Degrees Right Elbow Flexion: 150 Right Wrist Flexion: 70 Degrees Right Composite Finger Extension: 75% Right Composite Finger Flexion: 75% LUE Assessment LUE Assessment: Exceptions to [facility] General Strength Comments: MMT not tested d/t cervical precautions; 18 lbs LUE AROM (degrees) Left Shoulder Extension: 60 Degrees Left Shoulder Flexion: 125 Degrees Left Shoulder ABduction: 123 Degrees Left Shoulder Internal Rotation: 90 Degrees Left Shoulder External Rotation: 90 Degrees Left Shoulder Horizontal ABduction: 35 Degrees Left Shoulder Horizontal ADduction: 120 Degrees Left Elbow Flexion: 150  BLE strength not formally tested, grossly 3+/5 as pt able to weight bear without any buckling  noted.    Refer to Care Plan for Long Term Goals  Recommendations for other services: Neuropsych and Therapeutic Recreation  Stress management  Discharge Criteria: Patient will be discharged from PT if patient refuses treatment 3 consecutive times without medical reason, if treatment goals not met, if there is a change in medical status, if patient makes no progress towards goals or if patient is discharged from hospital.  The above assessment, treatment plan, treatment alternatives and goals were discussed and mutually agreed upon: by patient  [name] 03/30/2019, 1:10 PM

## 2019-03-30 NOTE — Evaluation (Signed)
Occupational Therapy Assessment and Plan  Patient Details  Name: Teresa Barnes MRN: [id number] Date of Birth: 1946/02/12  OT Diagnosis:  muscle weakness (generalized) and pain in cervical spine Rehab Potential: Rehab Potential (ACUTE ONLY): Good ELOS: 10-14 days   Today's Date: 03/30/2019 OT Individual Time: 0830-1000 OT Individual Time Calculation (min): 90 min     Problem List:  Patient Active Problem List   Diagnosis Date Noted  . Cervical myelopathy ([place]) 03/29/2019  . S/P cervical spinal fusion 03/26/2019  . Anemia of chronic disease 09/24/2018  . Piriformis syndrome of right side 09/10/2018  . Adhesive bursitis of left shoulder 08/05/2018  . History of right breast cancer 12/09/2017  . Mixed hyperlipidemia 12/09/2017  . Hypertension associated with diabetes ([place]) 12/09/2017  . Hiatal hernia 12/09/2017  . Esophageal stricture 12/09/2017  . Chronic gout 12/09/2017  . Glaucoma 12/09/2017  . Greater trochanteric bursitis of right hip 11/17/2017  . Degenerative arthritis of right knee 07/03/2017  . Normochromic anemia   . H/O: upper GI bleed 07/30/2016  . Right lumbar radiculopathy 01/23/2016  . Arthritis of right acromioclavicular joint 01/02/2016  . Controlled type 2 diabetes mellitus without complication, without long-term current use of insulin ([place]) 04/03/2009    Past Medical History:  Past Medical History:  Diagnosis Date  . Anemia   . Anemia of chronic disease 09/24/2018  . Anxiety   . Blood transfusion without reported diagnosis 07/2016  . Cancer ([place]) 1991   breast  . Chronic gout 12/09/2017   On allopurinol  . Diabetes mellitus without complication ([place])   . Esophageal stricture 12/09/2017   S/p dilation by GI: Dr. Scarlette Shorts; due to h/o severe recurrent erosive esophagitis.  . 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 GERD (gastroesophageal reflux disease)   . Glaucoma   . Hiatal hernia 12/09/2017  . Hyperlipidemia   .  Hypertension   . Right rotator cuff tear 11/24/2015   Injected this and 26 2017  . Wears glasses    Past Surgical History:  Past Surgical History:  Procedure Laterality Date  . ANTERIOR CERVICAL DECOMP/DISCECTOMY FUSION N/A 03/26/2019   Procedure: Anterior Cervical Discectomy Fusion - Cervical Three-Cervical Four - Cervical Four-Cervical Five - Cervical Five-Cervical Six;  Surgeon: Eustace Moore, MD;  Location: [facility];  Service: Neurosurgery;  Laterality: N/A;  Anterior Cervical Discectomy Fusion - Cervical Three-Cervical Four - Cervical Four-Cervical Five - Cervical Five-Cervical Six  . CATARACT EXTRACTION W/ INTRAOCULAR LENS  IMPLANT, BILATERAL  2000  . COLONOSCOPY    . cyst on spine    . ESOPHAGOGASTRODUODENOSCOPY (EGD) WITH PROPOFOL N/A 07/31/2016   Procedure: ESOPHAGOGASTRODUODENOSCOPY (EGD) WITH PROPOFOL;  Surgeon: Manus Gunning, MD;  Location: [facility];  Service: Gastroenterology;  Laterality: N/A;  . EYE SURGERY  2011   lt eye blled  . MASTECTOMY MODIFIED RADICAL Right 1990  . REDUCTION MAMMAPLASTY Left   . TRIGGER FINGER RELEASE Right 06/24/2013   Procedure: RELEASE TRIGGER FINGER/A-1 PULLEY PIP RIGHT LONG FINGER;  Surgeon: Cammie Sickle., MD;  Location: [facility];  Service: Orthopedics;  Laterality: Right;  . TRIGGER FINGER RELEASE    . TUBAL LIGATION      Assessment & Plan Clinical Impression: Teresa Barnes is a 73 year old right-handed female with history of diabetes mellitus, esophageal stricture with dilatation, chronic anemia, breast cancer, gout, hypertension and hyperlipidemia. Per chart review patient lives with spouse. Two-level home with bedroom on Main level. Patient was  independent until approximately 3 weeks ago needing physical assistance for limited household ambulation. Presented 03/26/2019 with complaints of numbness of the arms, loss of strength and limited gait. Patient with noted fall 3 weeks ago. X-rays and imaging revealed  critical cervical spinal stenosis C3-4, spinal stenosis C4-5, C5-6, cervical myelopathy. Underwent decompressive anterior cervical discectomy C3-4, 4-5, 5-6 with anterior cervical arthrodesis and anterior cervical plating C3-C6 03/26/2019 per Dr. Sherley Bounds.No cervical collar needed. Hospital course pain management. Decadron protocol as indicated. Therapy evaluations completed and patient was admitted for a comprehensive rehab program.  Patient transferred to CIR on 03/29/2019 .    Patient currently requires mod with basic self-care skills secondary to muscle weakness, decreased cardiorespiratoy endurance, unbalanced muscle activation and decreased coordination and decreased standing balance, decreased postural control, decreased balance strategies and difficulty maintaining precautions.  Prior to hospitalization, patient could complete ADLs with independent .  Patient will benefit from skilled intervention to decrease level of assist with basic self-care skills prior to discharge home with care partner.  Anticipate patient will require intermittent supervision and follow up outpatient.  OT - End of Session Activity Tolerance: Tolerates 10 - 20 min activity with multiple rests Endurance Deficit: Yes Endurance Deficit Description: generalized deconditioning OT Assessment Rehab Potential (ACUTE ONLY): Good OT Patient demonstrates impairments in the following area(s): Balance;Sensory;Motor;Pain;Endurance;Safety OT Basic ADL's Functional Problem(s): Eating;Grooming;Dressing;Bathing;Toileting OT Transfers Functional Problem(s): Toilet;Tub/Shower OT Additional Impairment(s): Fuctional Use of Upper Extremity OT Plan OT Intensity: Minimum of 1-2 x/day, 45 to 90 minutes OT Frequency: 5 out of 7 days OT Duration/Estimated Length of Stay: 10-14 days OT Treatment/Interventions: Balance/vestibular training;Community reintegration;Disease mangement/prevention;Neuromuscular re-education;Patient/family  education;Self Care/advanced ADL retraining;Therapeutic Exercise;UE/LE Coordination activities;Discharge planning;DME/adaptive equipment instruction;Functional mobility training;Pain management;Psychosocial support;Therapeutic Activities;UE/LE Strength taining/ROM OT Self Feeding Anticipated Outcome(s): set up OT Basic Self-Care Anticipated Outcome(s): (S) OT Toileting Anticipated Outcome(s): (S) OT Bathroom Transfers Anticipated Outcome(s): (S) OT Recommendation Patient destination: Home Follow Up Recommendations: Outpatient OT Equipment Recommended: To be determined   Skilled Therapeutic Intervention Skilled OT evaluation completed. Pt participated in creation of POC and ST/LT goals. Pt edu on ELOS and condition insight, and rehab expectations. Pt required edu and cueing throughout session on cervical precaution adherence during functional/bed mobility, including edu on log rolling technique. Pt's neck incision was occluded prior to shower, as well as IV. Pt completed SPT to w/c with mod A. Pt transferred into shower with mod A. Pt provided with mit on R hand 2/2 weak grip strength to encourage R UE functional use during bathing. Pt required min A to bathe UB, mod A LB bathing. Following shower, max A UB dressing and mod A LB dressing. Max A to don socks. Pt was transported to therapy gym where she attempted 9 hole peg test and extensive evaluation of R UE deficits. Pt returned to room and was left supine with all needs met. Edu re unit's fall prevention policy provided. Bed alarm set.   OT Evaluation Precautions/Restrictions  Precautions Precautions: Cervical;Fall Precaution Booklet Issued: Yes (comment) Precaution Comments: reviewed precautions Restrictions Weight Bearing Restrictions: No General Chart Reviewed: Yes Family/Caregiver Present: No  Pain Pain Assessment Pain Scale: 0-10 Pain Score: 0-No pain Home Living/Prior Functioning Home Living Family/patient expects to be  discharged to:: Private residence Living Arrangements: Spouse/significant other Available Help at Discharge: Family Type of Home: House Home Access: Stairs to enter Technical brewer of Steps: 3 Entrance Stairs-Rails: Left Home Layout: Able to live on main level with bedroom/bathroom Bathroom Shower/Tub: Multimedia programmer: Standard  Lives  With: Spouse IADL History Homemaking Responsibilities: Yes Meal Prep Responsibility: Primary Laundry Responsibility: Primary Cleaning Responsibility: Secondary Bill Paying/Finance Responsibility: Secondary Shopping Responsibility: Secondary Current License: Yes Mode of Transportation: Car Occupation: Retired 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  Prior Function Level of Independence: Independent with basic ADLs, Independent with gait, Independent with transfers Driving: Yes Vocation: Retired   Surveyor, mining Baseline Vision/History: Wears glasses Wears Glasses: Reading only Patient Visual Report: No change from baseline Vision Assessment?: No apparent visual deficits Perception  Perception: Within Functional Limits Praxis Praxis: Intact Cognition Overall Cognitive Status: Within Functional Limits for tasks assessed Arousal/Alertness: Awake/alert Orientation Level: Person;Place;Situation Person: Oriented Place: Oriented Situation: Oriented Year: 2020 Month: May Day of Week: Correct Memory: Appears intact Immediate Memory Recall: Blue;Bed;Sock Memory Recall: Sock;Blue;Bed Memory Recall Sock: Without Cue Memory Recall Blue: Without Cue Memory Recall Bed: Without Cue Attention: Selective Selective Attention: Appears intact Awareness: Appears intact Problem Solving: Appears intact Safety/Judgment: Appears intact Sensation Sensation Light Touch: Impaired by gross assessment Hot/Cold: Appears Intact Additional Comments: Impaired sensation in BUE Coordination Gross Motor Movements are  Fluid and Coordinated: No Fine Motor Movements are Fluid and Coordinated: No Coordination and Movement Description: deconditioning and cervical induced weakness/sensation Finger Nose Finger Test: WFL within limits of cervical limitations 9 Hole Peg Test: Unable to complete 2/2 grasp strength Motor  Motor Motor: Other (comment) Motor - Skilled Clinical Observations: generalized deconditioning Mobility  Bed Mobility Bed Mobility: Rolling Left;Left Sidelying to Sit Rolling Left: Contact Guard/Touching assist Left Sidelying to Sit: Moderate Assistance - Patient 50-74% Transfers Sit to Stand: Moderate Assistance - Patient 50-74% Stand to Sit: Moderate Assistance - Patient 50-74%  Trunk/Postural Assessment  Cervical Assessment Cervical Assessment: Exceptions to WFL(s/p cervical sx) Thoracic Assessment Thoracic Assessment: Within Functional Limits Lumbar Assessment Lumbar Assessment: Within Functional Limits Postural Control Postural Control: Deficits on evaluation Righting Reactions: delayed  Balance Balance Balance Assessed: Yes Static Sitting Balance Static Sitting - Balance Support: Feet supported Static Sitting - Level of Assistance: 5: Stand by assistance Dynamic Sitting Balance Dynamic Sitting - Balance Support: Feet supported Dynamic Sitting - Level of Assistance: 5: Stand by assistance Dynamic Sitting - Balance Activities: Reaching for objects Static Standing Balance Static Standing - Balance Support: During functional activity;Left upper extremity supported Static Standing - Level of Assistance: 4: Min assist Dynamic Standing Balance Dynamic Standing - Balance Support: During functional activity;Left upper extremity supported Dynamic Standing - Level of Assistance: 3: Mod assist Dynamic Standing - Balance Activities: Reaching across midline Extremity/Trunk Assessment RUE Assessment RUE Assessment: Exceptions to [facility] General Strength Comments: MMT not completed d/t  cervical precautions; 11 lbs RUE Body System: Ortho RUE AROM (degrees) Right Shoulder Extension: 50 Degrees Right Shoulder Flexion: 20 Degrees Right Shoulder ABduction: 40 Degrees Right Shoulder Horizontal ABduction: 30 Degrees Right Shoulder Horizontal  ADduction: 110 Degrees Right Elbow Flexion: 150 Right Wrist Flexion: 70 Degrees Right Composite Finger Extension: 75% Right Composite Finger Flexion: 75% LUE Assessment LUE Assessment: Exceptions to [facility] General Strength Comments: MMT not tested d/t cervical precautions; 18 lbs LUE AROM (degrees) Left Shoulder Extension: 60 Degrees Left Shoulder Flexion: 125 Degrees Left Shoulder ABduction: 123 Degrees Left Shoulder Internal Rotation: 90 Degrees Left Shoulder External Rotation: 90 Degrees Left Shoulder Horizontal ABduction: 35 Degrees Left Shoulder Horizontal ADduction: 120 Degrees Left Elbow Flexion: 150     Refer to Care Plan for Long Term Goals  Recommendations for other services: None    Discharge Criteria: Patient will be discharged from OT if patient  refuses treatment 3 consecutive times without medical reason, if treatment goals not met, if there is a change in medical status, if patient makes no progress towards goals or if patient is discharged from hospital.  The above assessment, treatment plan, treatment alternatives and goals were discussed and mutually agreed upon: by patient  Curtis Sites 03/30/2019, 10:34 AM

## 2019-03-30 NOTE — Progress Notes (Addendum)
[facility] Individual Statement of Services  Patient Name:  Teresa Barnes  Date:  03/30/2019  Welcome to the [facility].  Our goal is to provide you with an individualized program based on your diagnosis and situation, designed to meet your specific needs.  With this comprehensive rehabilitation program, you will be expected to participate in at least 3 hours of rehabilitation therapies Monday-Friday, with modified therapy programming on the weekends.  Your rehabilitation program will include the following services:  Physical Therapy (PT), Occupational Therapy (OT), 24 hour per day rehabilitation nursing, Neuropsychology, Case Management (Social Worker), Rehabilitation Medicine, Nutrition Services and Pharmacy Services  Weekly team conferences will be held on Tuesdays to discuss your progress.  Your Social Worker will talk with you frequently to get your input and to update you on team discussions.  Team conferences with you and your family in attendance may also be held.  Expected length of stay: 2 to 3 weeks Overall anticipated outcome:  Supervision goals  Depending on your progress and recovery, your program may change. Your Social Worker will coordinate services and will keep you informed of any changes. Your Social Worker's name and contact numbers are listed  below.  The following services may also be recommended but are not provided by the [facility] will be made to provide these services after discharge if needed.  Arrangements include referral to agencies that provide these services.  Your insurance has been verified to be:  [employer] Your primary doctor is:  Dr. Billey Chang  Pertinent information will be shared with your doctor and your insurance company.  Social Worker:  Alfonse Alpers,  LCSW  [phone] or [phone]  Information discussed with and copy given to patient by: Trey Sailors, 03/30/2019, 3:31 PM

## 2019-03-31 ENCOUNTER — Inpatient Hospital Stay (HOSPITAL_COMMUNITY): Payer: Medicare Other

## 2019-03-31 ENCOUNTER — Inpatient Hospital Stay (HOSPITAL_COMMUNITY): Payer: Medicare Other | Admitting: Physical Therapy

## 2019-03-31 LAB — GLUCOSE, CAPILLARY
Glucose-Capillary: 170 mg/dL — ABNORMAL HIGH (ref 70–99)
Glucose-Capillary: 162 mg/dL — ABNORMAL HIGH (ref 70–99)
Glucose-Capillary: 261 mg/dL — ABNORMAL HIGH (ref 70–99)

## 2019-03-31 MED ORDER — BRIMONIDINE TARTRATE 0.2 % OP SOLN
1.0000 [drp] | Freq: Two times a day (BID) | OPHTHALMIC | Status: DC
  Administered 2019-03-31 – 2019-04-13 (×26): 1 [drp] via OPHTHALMIC
  Filled 2019-03-31: qty 5

## 2019-03-31 MED ORDER — PANTOPRAZOLE SODIUM 40 MG PO TBEC
40.0000 mg | DELAYED_RELEASE_TABLET | Freq: Every day | ORAL | Status: DC
  Administered 2019-03-31 – 2019-04-13 (×14): 40 mg via ORAL
  Filled 2019-03-31 (×14): qty 1

## 2019-03-31 MED ORDER — NAPHAZOLINE-GLYCERIN 0.012-0.2 % OP SOLN
1.0000 [drp] | Freq: Four times a day (QID) | OPHTHALMIC | Status: DC | PRN
  Administered 2019-03-31 – 2019-04-01 (×3): 2 [drp] via OPHTHALMIC
  Administered 2019-04-03 – 2019-04-04 (×3): 1 [drp] via OPHTHALMIC
  Administered 2019-04-05 – 2019-04-06 (×2): 2 [drp] via OPHTHALMIC
  Administered 2019-04-07: 1 [drp] via OPHTHALMIC
  Filled 2019-03-31: qty 15

## 2019-03-31 MED ORDER — TIMOLOL MALEATE 0.5 % OP SOLN
1.0000 [drp] | Freq: Two times a day (BID) | OPHTHALMIC | Status: DC
  Administered 2019-03-31 – 2019-04-13 (×26): 1 [drp] via OPHTHALMIC
  Filled 2019-03-31: qty 5

## 2019-03-31 NOTE — Progress Notes (Signed)
[place] [facility] PHYSICAL MEDICINE & REHABILITATION PROGRESS NOTE   Subjective/Complaints: Pt had a good day with therapy. Feels that she's paying for it this morning with increased neck pain. Reported dry eyes and wanting something for GERD  ROS: Patient denies fever, rash, sore throat, blurred vision, nausea, vomiting, diarrhea, cough, shortness of breath or chest pain,   headache, or mood change.   Objective:   Vas [place] Lower Extremity Venous (dvt)  Result Date: 03/30/2019  Lower Venous Study Indications: Edema.  Performing Technologist: June Leap RDMS, RVT  Examination Guidelines: A complete evaluation includes B-mode imaging, spectral Doppler, color Doppler, and power Doppler as needed of all accessible portions of each vessel. Bilateral testing is considered an integral part of a complete examination. Limited examinations for reoccurring indications may be performed as noted.  +---------+---------------+---------+-----------+----------+-------+ RIGHT    CompressibilityPhasicitySpontaneityPropertiesSummary +---------+---------------+---------+-----------+----------+-------+ CFV      Full           Yes      Yes                          +---------+---------------+---------+-----------+----------+-------+ SFJ      Full                                                 +---------+---------------+---------+-----------+----------+-------+ FV Prox  Full                                                 +---------+---------------+---------+-----------+----------+-------+ FV Mid   Full                                                 +---------+---------------+---------+-----------+----------+-------+ FV DistalFull                                                 +---------+---------------+---------+-----------+----------+-------+ PFV      Full                                                 +---------+---------------+---------+-----------+----------+-------+ POP       Full           Yes      Yes                          +---------+---------------+---------+-----------+----------+-------+ PTV      Full                                                 +---------+---------------+---------+-----------+----------+-------+ PERO     Full                                                 +---------+---------------+---------+-----------+----------+-------+   +---------+---------------+---------+-----------+----------+-------+  LEFT     CompressibilityPhasicitySpontaneityPropertiesSummary +---------+---------------+---------+-----------+----------+-------+ CFV      Full           Yes      Yes                          +---------+---------------+---------+-----------+----------+-------+ SFJ      Full                                                 +---------+---------------+---------+-----------+----------+-------+ FV Prox  Full                                                 +---------+---------------+---------+-----------+----------+-------+ FV Mid   Full                                                 +---------+---------------+---------+-----------+----------+-------+ FV DistalFull                                                 +---------+---------------+---------+-----------+----------+-------+ PFV      Full                                                 +---------+---------------+---------+-----------+----------+-------+ POP      Full           Yes      Yes                          +---------+---------------+---------+-----------+----------+-------+ PTV      Full                                                 +---------+---------------+---------+-----------+----------+-------+ PERO     Full                                                 +---------+---------------+---------+-----------+----------+-------+     Summary: Right: There is no evidence of deep vein thrombosis in the lower extremity. No  cystic structure found in the popliteal fossa. Left: There is no evidence of deep vein thrombosis in the lower extremity. No cystic structure found in the popliteal fossa.  *See table(s) above for measurements and observations. Electronically signed by Deitra Mayo MD on 03/30/2019 at 7:01:23 PM.    Final    Recent Labs    03/30/19 0638  WBC 7.9  HGB 9.4*  HCT 27.8*  PLT 339   Recent Labs    03/30/19 0638  NA 137  K 4.6  CL 104  CO2 23  GLUCOSE 253*  BUN 28*  CREATININE 1.11*  CALCIUM 9.3    Intake/Output Summary (Last 24 hours) at 03/31/2019 0854 Last data filed at 03/30/2019 1859 Gross per 24 hour  Intake 358 ml  Output 100 ml  Net 258 ml     Physical Exam: Vital Signs Blood pressure (!) 165/50, pulse (!) 58, temperature 98.3 F (36.8 C), temperature source Oral, resp. rate 16, height 5\' 4"  (1.626 m), weight 80.7 kg, SpO2 99 %. Constitutional: No distress . Vital signs reviewed. HEENT: EOMI, oral membranes moist Neck: incision CDI with steristrips Cardiovascular: RRR without murmur. No JVD    Respiratory: CTA Bilaterally without wheezes or rales. Normal effort    GI: BS +, non-tender, non-distended  Musculoskeletal:Normal range of motion.  General: tr to 1+ edema RUE.  Comments: right shoulder limited ROM Neurological: She is alertand oriented to person, place, and time. Nocranial nerve deficit. Normal insight and awareness, RUE 2+/5 prox to 3+/5 distally. LUE 3/5 prox to 3+ to 4-/5 distally. LE 4/5 prox to distal. Decreased sensation R>L to LT/pain continues. DTR's 1+ UE and 2+ LE.  Skin: Skin iswarm. Noerythema.  Marland Kitchen Psychiatric:anxious   Assessment/Plan: 1. Functional deficits secondary to cervical myelopathy which require 3+ hours per day of interdisciplinary therapy in a comprehensive inpatient rehab setting.  Physiatrist is providing close team supervision and 24 hour management of active medical problems listed below.  Physiatrist  and rehab team continue to assess barriers to discharge/monitor patient progress toward functional and medical goals  Care Tool:  Bathing  Bathing activity did not occur: Safety/medical concerns     Body parts bathed by helper: Front perineal area, Buttocks     Bathing assist Assist Level: Total Assistance - Patient < 25%     Upper Body Dressing/Undressing Upper body dressing   What is the patient wearing?: Pull over shirt    Upper body assist Assist Level: Maximal Assistance - Patient 25 - 49%    Lower Body Dressing/Undressing Lower body dressing      What is the patient wearing?: Underwear/pull up, Pants     Lower body assist Assist for lower body dressing: Moderate Assistance - Patient 50 - 74%     Toileting Toileting    Toileting assist Assist for toileting: Moderate Assistance - Patient 50 - 74%     Transfers Chair/bed transfer  Transfers assist  Chair/bed transfer activity did not occur: Safety/medical concerns  Chair/bed transfer assist level: Moderate Assistance - Patient 50 - 74%     Locomotion Ambulation   Ambulation assist      Assist level: Moderate Assistance - Patient 50 - 74% Assistive device: Walker-rolling(right hand orthosis) Max distance: 25 ft   Walk 10 feet activity   Assist     Assist level: Moderate Assistance - Patient - 50 - 74% Assistive device: Walker-rolling(R hand orthosis)   Walk 50 feet activity   Assist Walk 50 feet with 2 turns activity did not occur: Safety/medical concerns         Walk 150 feet activity   Assist Walk 150 feet activity did not occur: Safety/medical concerns         Walk 10 feet on uneven surface  activity   Assist Walk 10 feet on uneven surfaces activity did not occur: Safety/medical concerns         Wheelchair     Assist Will patient use wheelchair at discharge?: No Type of Wheelchair: Manual    Wheelchair assist level: Maximal Assistance -  Patient 25 - 49% Max  wheelchair distance: 5 ft    Wheelchair 50 feet with 2 turns activity    Assist    Wheelchair 50 feet with 2 turns activity did not occur: Safety/medical concerns       Wheelchair 150 feet activity     Assist Wheelchair 150 feet activity did not occur: Safety/medical concerns        Medical Problem List and Plan: 1.Decreased functional mobilitysecondary to cervical stenosis with myelopathy status with weakness in a central cord pattern. She is status post decompressive anterior cervical discectomy 03/26/2019.No cervical collar needed -Patient is beginning CIR therapies today including PT and OT   -adhesive capsulitis right shoulder---aggressive ROM with therapy   -alnalgesic balm helped somewhat   -consider injection 2. Antithrombotics: -DVT/anticoagulation:SCDs. dopplers performed and ?negative -antiplatelet therapy: N/A 3. Pain Management:Oxycodone and Robaxin as needed  -kpad for neck 4. Mood:needs a lot of + reinforcement -antipsychotic agents: N/A 5. Neuropsych: This patientiscapable of making decisions on hisown behalf. 6. Skin/Wound Care:Routine skin checks 7. Fluids/Electrolytes/Nutrition: encourage PO  Continue to push po  -protein for low albumin 8.Diabetes mellitus with peripheral neuropathy. Hemoglobin A1c 6.6. SSI, Glucophage 1000 mg twice daily, Glucotrol XL 5 mg daily if blood sugar greater than 150.    -decadron taper, sugars should improve 9. Hypertension. Lisinopril 5 mg daily 10. Constipation/neurogenic bowel. -establishing  dailybowel program, had results with suppository yesterday   LOS: 2 days A FACE TO FACE EVALUATION WAS PERFORMED  [name] 03/31/2019, 8:54 AM

## 2019-03-31 NOTE — Patient Care Conference (Signed)
Inpatient RehabilitationTeam Conference and Plan of Care Update Date: 03/30/2019   Time: 2:30 PM    Patient Name: Teresa Barnes      Medical Record Number: [id number]  Date of Birth: September 28, 1946 Sex: Female         Room/Bed: 4W25C/4W25C-01 Payor Info: Payor: [employer] MEDICARE / Plan: [facility] MEDICARE / Product Type: *No Product type* /    Admitting Diagnosis: cervical myelopathy no SLP  Admit Date/Time:  03/29/2019  5:59 PM Admission Comments: No comment available   Primary Diagnosis:  <principal problem not specified> Principal Problem: <principal problem not specified>  Patient Active Problem List   Diagnosis Date Noted  . Cervical myelopathy ([place]) 03/29/2019  . S/P cervical spinal fusion 03/26/2019  . Anemia of chronic disease 09/24/2018  . Piriformis syndrome of right side 09/10/2018  . Adhesive bursitis of left shoulder 08/05/2018  . History of right breast cancer 12/09/2017  . Mixed hyperlipidemia 12/09/2017  . Hypertension associated with diabetes ([place]) 12/09/2017  . Hiatal hernia 12/09/2017  . Esophageal stricture 12/09/2017  . Chronic gout 12/09/2017  . Glaucoma 12/09/2017  . Greater trochanteric bursitis of right hip 11/17/2017  . Degenerative arthritis of right knee 07/03/2017  . Normochromic anemia   . H/O: upper GI bleed 07/30/2016  . Right lumbar radiculopathy 01/23/2016  . Arthritis of right acromioclavicular joint 01/02/2016  . Controlled type 2 diabetes mellitus without complication, without long-term current use of insulin ([place]) 04/03/2009    Expected Discharge Date: Expected Discharge Date: (TBD) about 3 weeks  Team Members Present: Physician leading conference: Dr. Alger Simons Social Worker Present: Alfonse Alpers, LCSW Nurse Present: Isla Pence, RN PT Present: Lavone Nian, PT OT Present: Other (comment)(Sandra Rosana Hoes, OT) SLP Present: Weston Anna, SLP PPS Coordinator present : Gunnar Fusi     Current Status/Progress Goal Weekly  Team Focus  Medical   cervical stenosis with fall and subsequent weakness, central cord picture. neurogenic bowel  improve functional mobility, decrease pain  pain mgt, mobilize ROM, education re: bowels   Bowel/Bladder   continent x 2, LBM 03/30/19  to remain continent and have regular BM and void up to toilet  to continue with care plan   Swallow/Nutrition/ Hydration             ADL's   Max UB dressing, mod A LB, mod A bathing, mod A transfers  Supervision overall ADLs and transfers  R UE NMR/FMC, ADL retraining, ADL transfers   Mobility   mod assist transfers, mod assist gait with RW & R hand orthosis, impaired RUE use>LUE  supervision overall with LRAD except stair negotiation with CGA  balance, transfers, gait, bed mobility, endurance, strengthening, education, stairs as able   Communication             Safety/Cognition/ Behavioral Observations  Pt is a + o, able to make needs known  to remain free from falls  to continue with care plan    Pain   pain is 6/10 with some relief from prns  to be less than 3 with prns  to continue to monitor and assess every 4 hours   Skin   skin is intact  skin to remain intact  will continue to monitor and assess every shift    Rehab Goals Patient on target to meet rehab goals: Yes Rehab Goals Revised: none - pt's first conference of day of evaluations *See Care Plan and progress notes for long and short-term goals.     Barriers to Discharge  Current Status/Progress Possible Resolutions Date Resolved   Physician    Medical stability               Nursing  Medication compliance               PT                    OT                  SLP                SW                Discharge Planning/Teaching Needs:  Pt plans to d/c home where her husband will be able to provide 24/7 supervision.  Pt's husband can come in for family education as needed.   Team Discussion:  Pt with cervical myelopathy/decompression who had a fall a few  weeks prior to admission.  Pt presents as a central cord.  She also has diabetes and is anxious.  Team will need to work on bladder and bowel.  Pt is voiding, but not double voiding and nursing is doing bladder scans.  Pt has not had a bowel movement and Dr. Naaman Plummer wants pt to have a suppository and enema (if needed) today.  Pt is refusing senna.  Pt is max A for uper body and mod A for lower body bathing and dressing.  She is mod A for transfers and is pleasant and motivated.  Her right arm is affected and pt has prior rotator cuff/frozen shoulder issues.  Pt is mod A with PT and walked 25' with rolling walker until fatigues.  Pt wants a shot for her shoulder and Dr. Naaman Plummer will assess for this.  Pt's goals are supervision overall.  Revisions to Treatment Plan:  none    Continued Need for Acute Rehabilitation Level of Care: The patient requires daily medical management by a physician with specialized training in physical medicine and rehabilitation for the following conditions: Daily direction of a multidisciplinary physical rehabilitation program to ensure safe treatment while eliciting the highest outcome that is of practical value to the patient.: Yes Daily medical management of patient stability for increased activity during participation in an intensive rehabilitation regime.: Yes Daily analysis of laboratory values and/or radiology reports with any subsequent need for medication adjustment of medical intervention for : Post surgical problems;Neurological problems   I attest that I was present, lead the team conference, and concur with the assessment and plan of the team.Team conference was held via web/ teleconference due to [place] - 19.   Tamsyn Owusu, Silvestre Mesi 03/31/2019, 11:59 AM

## 2019-03-31 NOTE — Progress Notes (Signed)
Occupational Therapy Session Note  Patient Details  Name: Teresa Barnes MRN: [id number] Date of Birth: Mar 31, 1946  Today's Date: 03/31/2019 OT Individual Time: 1500-1600 OT Individual Time Calculation (min): 60 min    Short Term Goals: Week 1:  OT Short Term Goal 1 (Week 1): Pt will don shirt with min A OT Short Term Goal 2 (Week 1): Pt will use LRAD to don pants with min A OT Short Term Goal 3 (Week 1): Pt will transfer into walk in shower with min A OT Short Term Goal 4 (Week 1): Pt will increase R functional grasp to hold toothbrush  Skilled Therapeutic Interventions/Progress Updates:    1:1. Pt received in bed. Pt completes ambulation with RW to bathroom to transfer onto toilet. Pt requirs A for clothing management. Pt compltes transfer as stated above into shower. Pt bathes sit to stand with MIN A for balance, A to wash buttocks, B feet and facilitation of RUE to wash L underarm. Pt very talkative throughout and d/t time contraints, OT applies toothpaste to toothbrush an doedorant to underarms and pt completes oral care. Pt requires MOD A to don shirt and total A for pants d/t time. Pt stand pivot transfer back to bed and OT exits with call lightin reach and exit alarm on.  Therapy Documentation Precautions:  Precautions Precautions: Cervical, Fall Precaution Booklet Issued: Yes (comment) Precaution Comments: reviewed precautions Restrictions Weight Bearing Restrictions: No General:   Vital Signs: Therapy Vitals Temp: (!) 97.5 F (36.4 C) Temp Source: Oral Pulse Rate: 62 Resp: 16 BP: (!) 152/53 Patient Position (if appropriate): Sitting Oxygen Therapy SpO2: 100 % O2 Device: Room Air Pain: Pain Assessment Pain Scale: 0-10 Pain Score: 5  Pain Type: Surgical pain Pain Location: Incision Pain Orientation: Anterior Pain Descriptors / Indicators: Aching Pain Frequency: Constant Pain Onset: On-going Pain Intervention(s): Medication (See eMAR) ADL:   Vision    Perception    Praxis   Exercises:   Other Treatments:     Therapy/Group: Individual Therapy  Tonny Branch 03/31/2019, 3:42 PM

## 2019-03-31 NOTE — Progress Notes (Signed)
Social Work Patient ID: Teresa Barnes, female   DOB: 1946/09/12, 73 y.o.   MRN: [id number]   CSW met with pt 03-30-19 to update her on team conference discussion and targeted LOS of around 3 weeks.  She hopes to go home sooner, but understands that she needs a lot of therapy before reaching supervision level.  Pt's husband will provide 24/7 supervision and she prefers to give him the update at this time, but CSW is available to talk with him as she requests.  CSW will continue to follow and assist as needed.

## 2019-03-31 NOTE — Progress Notes (Signed)
Occupational Therapy Session Note  Patient Details  Name: Teresa Barnes MRN: [id number] Date of Birth: 09-Aug-1946  Today's Date: 03/31/2019 OT Individual Time: 1225-8346 OT Individual Time Calculation (min): 60 min    Short Term Goals: Week 1:  OT Short Term Goal 1 (Week 1): Pt will don shirt with min A OT Short Term Goal 2 (Week 1): Pt will use LRAD to don pants with min A OT Short Term Goal 3 (Week 1): Pt will transfer into walk in shower with min A OT Short Term Goal 4 (Week 1): Pt will increase R functional grasp to hold toothbrush  Skilled Therapeutic Interventions/Progress Updates:    Pt received supine in bed with c/o pain in neck, unrated, but very obviously in pain. Discussed pain management strategies and pain medication with therapy schedule. Pt very concerned with taking too much medication- stressed importance and normalized pain management, esp following cervical sx. Set pt up for breakfast, including use of built up handle. Pt was provided a mug with handle d/t pt being unable to control muscle strength and crushing styrofoam cups. While still seated in bed, pt was provided heat pack for R shoulder- pt with hx of rotator cuff injury and adhesive capsulitis. Pt then completed R [facility] activity with foam blocks, with cueing for finger and pincer grasp isolation. Pt's R UE was taken through all planes via PROM and AAROM. Pt left supine with all needs met, bed alarm set.   Therapy Documentation Precautions:  Precautions Precautions: Cervical, Fall Precaution Booklet Issued: Yes (comment) Precaution Comments: reviewed precautions Restrictions Weight Bearing Restrictions: No   Therapy/Group: Individual Therapy  Curtis Sites 03/31/2019, 7:15 AM

## 2019-03-31 NOTE — Progress Notes (Signed)
Physical Therapy Session Note  Patient Details  Name: Teresa Barnes MRN: [id number] Date of Birth: 1946-08-14  Today's Date: 03/31/2019 PT Individual Time: 0920-1030 PT Individual Time Calculation (min): 70 min   Short Term Goals: Week 1:  PT Short Term Goal 1 (Week 1): Pt will consistently complete bed mobility with min assist without hospital bed features.  PT Short Term Goal 2 (Week 1): Pt will consistently complete transfers with min assist & LRAD. PT Short Term Goal 3 (Week 1): Pt will ambulate 50 ft with LRAD & min assist.  PT Short Term Goal 4 (Week 1): Pt will negotiate 4 steps with R rail to simulate home entry with mod assist.   Skilled Therapeutic Interventions/Progress Updates:  Pt received on toilet & agreeable to tx. Pt denies c/o pain & reports she is premedicated. Pt voices significant fatigue. Pt with continent void on toilet and performs toilet transfer with mod assist for sit>Stand and stand pivot to w/c. Pt performs hand hygiene at sink from w/c level with assistance to obtain soap & papertowels. Pt reports not feeling well & dizziness, BP = 124/53 mmHg (LUE, sitting), HR = 60 bpm. Therapist dons ted hose and provides pt with diet soda upon request - encouraged pt to ensure she adequately eats/drinks throughout the day. After rest break BP re checked = 132/55 mmHg (LUE, sitting), HR = 62 bpm in sitting then standing BP = 122/49 mmHg (LUE), HR = 71 bpm. RN made aware of pt's c/o dizziness. Provided pt with BSC to use in room & transported pt to gym via w/c dependent assist. Pt transfers sit<>stand with RW, min assist, & cuing for hand placement on stable surface for sit>stand. Pt ambulates 5 ft + 5 ft w/c<>nu-step with RW & min assist with therapist managing RUE on orthosis but pt with improving balance on this date. Pt utilized nu-step on level 3 x 10 minutes with all four extremities (pt with no issues maintaining grip with RUE) with task focusing on global strengthening,  endurance training, & coordination of reciprocal movements. Pt declines attempting long distance gait so instead ambulates in room to bed with RW & min assist. Pt requires mod assist for sit>supine for BLE and max cuing with poor return demo of following instructions for sit>sidelying>supine for log rolling. At end of session pt left in bed with alarm set & needs at hand, set up with pudding snack and spoon with built up handle.  Therapy Documentation Precautions:  Precautions Precautions: Cervical, Fall Precaution Booklet Issued: Yes (comment) Precaution Comments: reviewed precautions Restrictions Weight Bearing Restrictions: No    Therapy/Group: Individual Therapy  [name] 03/31/2019, 10:30 AM

## 2019-04-01 ENCOUNTER — Inpatient Hospital Stay (HOSPITAL_COMMUNITY): Payer: Medicare Other

## 2019-04-01 ENCOUNTER — Inpatient Hospital Stay (HOSPITAL_COMMUNITY): Payer: Medicare Other | Admitting: Physical Therapy

## 2019-04-01 LAB — GLUCOSE, CAPILLARY
Glucose-Capillary: 192 mg/dL — ABNORMAL HIGH (ref 70–99)
Glucose-Capillary: 195 mg/dL — ABNORMAL HIGH (ref 70–99)
Glucose-Capillary: 177 mg/dL — ABNORMAL HIGH (ref 70–99)
Glucose-Capillary: 102 mg/dL — ABNORMAL HIGH (ref 70–99)
Glucose-Capillary: 138 mg/dL — ABNORMAL HIGH (ref 70–99)

## 2019-04-01 NOTE — Progress Notes (Signed)
Physical Therapy Session Note  Patient Details  Name: Teresa Barnes MRN: [id number] Date of Birth: [birth year]  Today's Date: 04/01/2019 PT Individual Time: 0800-0902 PT Individual Time Calculation (min): 62 min   Short Term Goals: Week 1:  PT Short Term Goal 1 (Week 1): Pt will consistently complete bed mobility with min assist without hospital bed features.  PT Short Term Goal 2 (Week 1): Pt will consistently complete transfers with min assist & LRAD. PT Short Term Goal 3 (Week 1): Pt will ambulate 50 ft with LRAD & min assist.  PT Short Term Goal 4 (Week 1): Pt will negotiate 4 steps with R rail to simulate home entry with mod assist.   Skilled Therapeutic Interventions/Progress Updates:      Therapy Documentation Precautions:  Precautions Precautions: Cervical, Fall Precaution Booklet Issued: Yes (comment) Precaution Comments: reviewed precautions Restrictions Weight Bearing Restrictions: No  Treatment: Pt in bed on arrival to room. Agreed to PT at this time. Denies any pain.  Bed mobility: min assist to roll onto left side with use of rail and HOB flat. Mod assist to come up to sitting at edge of bed. Min assist to fully scoot hips closer to edge of bed. After session seated at edge of bed, min assist to lie to left side and roll into supine.   Transfers: Min assist for initial stand from bed with cues on hand placement and forward weight shifting to stand to RW. Min assist from toilet with hand rails, cues on technique. Progressed to min guard assist to/from Nustep, mat table for several reps (see exercies) and wheelchair. Needed reminder cues for hand placement and weight shifting with all tranfers.   Gait: 15 feet x2 with RW in room for bathroom use. Cues needed on posture, step length, walker position and reminder cues to secure strap on right hand orthotic before starting gait. Min guard to min assist for balance and walker negotiation.   Exercises: Nustep with all 4  extremities level 4 for 8 minutes with goal >/= 40 steps/minute for strengthening and activity tolerance. Sit<>stands from low mat table with emphasis on tall posture and slow descent with light UE support for 10 reps. In standing with UE support on chair back for balance: alternating heel raises x10 each side, alternating marching x 10 each side, and mini squats x 10 reps.   Pt transported to gym and back to room via wheelchair. Pt left in bed with HOB 40 degrees, bed alarm on and all needs in reach.   Therapy/Group: Individual Therapy  Lindon Romp, PTA, CLT 04/01/19, 1:15 PM 04/01/2019, 1:15 PM

## 2019-04-01 NOTE — Progress Notes (Signed)
[place] PHYSICAL MEDICINE & REHABILITATION PROGRESS NOTE   Subjective/Complaints: Made progress yesterday. Tolerated therapy. Neck feels better today  ROS: Patient denies fever, rash, sore throat, blurred vision, nausea, vomiting, diarrhea, cough, shortness of breath or chest pain,   or mood change. .   Objective:   Vas [place] Lower Extremity Venous (dvt)  Result Date: 03/30/2019  Lower Venous Study Indications: Edema.  Performing Technologist: June Leap RDMS, RVT  Examination Guidelines: A complete evaluation includes B-mode imaging, spectral Doppler, color Doppler, and power Doppler as needed of all accessible portions of each vessel. Bilateral testing is considered an integral part of a complete examination. Limited examinations for reoccurring indications may be performed as noted.  +---------+---------------+---------+-----------+----------+-------+ RIGHT    CompressibilityPhasicitySpontaneityPropertiesSummary +---------+---------------+---------+-----------+----------+-------+ CFV      Full           Yes      Yes                          +---------+---------------+---------+-----------+----------+-------+ SFJ      Full                                                 +---------+---------------+---------+-----------+----------+-------+ FV Prox  Full                                                 +---------+---------------+---------+-----------+----------+-------+ FV Mid   Full                                                 +---------+---------------+---------+-----------+----------+-------+ FV DistalFull                                                 +---------+---------------+---------+-----------+----------+-------+ PFV      Full                                                 +---------+---------------+---------+-----------+----------+-------+ POP      Full           Yes      Yes                           +---------+---------------+---------+-----------+----------+-------+ PTV      Full                                                 +---------+---------------+---------+-----------+----------+-------+ PERO     Full                                                 +---------+---------------+---------+-----------+----------+-------+   +---------+---------------+---------+-----------+----------+-------+  LEFT     CompressibilityPhasicitySpontaneityPropertiesSummary +---------+---------------+---------+-----------+----------+-------+ CFV      Full           Yes      Yes                          +---------+---------------+---------+-----------+----------+-------+ SFJ      Full                                                 +---------+---------------+---------+-----------+----------+-------+ FV Prox  Full                                                 +---------+---------------+---------+-----------+----------+-------+ FV Mid   Full                                                 +---------+---------------+---------+-----------+----------+-------+ FV DistalFull                                                 +---------+---------------+---------+-----------+----------+-------+ PFV      Full                                                 +---------+---------------+---------+-----------+----------+-------+ POP      Full           Yes      Yes                          +---------+---------------+---------+-----------+----------+-------+ PTV      Full                                                 +---------+---------------+---------+-----------+----------+-------+ PERO     Full                                                 +---------+---------------+---------+-----------+----------+-------+     Summary: Right: There is no evidence of deep vein thrombosis in the lower extremity. No cystic structure found in the popliteal fossa. Left: There is  no evidence of deep vein thrombosis in the lower extremity. No cystic structure found in the popliteal fossa.  *See table(s) above for measurements and observations. Electronically signed by Deitra Mayo MD on 03/30/2019 at 7:01:23 PM.    Final    Recent Labs    03/30/19 0638  WBC 7.9  HGB 9.4*  HCT 27.8*  PLT 339   Recent Labs    03/30/19 0638  NA 137  K 4.6  CL 104  CO2 23  GLUCOSE 253*  BUN 28*  CREATININE 1.11*  CALCIUM 9.3    Intake/Output Summary (Last 24 hours) at 04/01/2019 0936 Last data filed at 04/01/2019 0723 Gross per 24 hour  Intake 240 ml  Output 775 ml  Net -535 ml     Physical Exam: Vital Signs Blood pressure (!) 160/58, pulse (!) 55, temperature 98.3 F (36.8 C), temperature source Oral, resp. rate 18, height 5\' 4"  (1.626 m), weight 80.7 kg, SpO2 98 %. Constitutional: No distress . Vital signs reviewed. HEENT: EOMI, oral membranes moist Neck: supple Cardiovascular: RRR without murmur. No JVD    Respiratory: CTA Bilaterally without wheezes or rales. Normal effort    GI: BS +, non-tender, non-distended  Musculoskeletal:neck tender, sitting agst kpad  General: tr to 1+ edema RUE.  Comments: right shoulder remains limited ROM Neurological: She is alertand oriented to person, place, and time. Nocranial nerve deficit. Normal insight and awareness, RUE 2+/5 prox to 3+/5 distally. LUE 3/5 prox to 3+ to 4-/5 distally. LE 4/5 prox to distal. Decreased sensation R>L to LT/pain continues. DTR's 1+ UE and 2+ LE. --stable exam Skin: Skin iswarm. Noerythema.  Marland Kitchen Psychiatric:pleasant   Assessment/Plan: 1. Functional deficits secondary to cervical myelopathy which require 3+ hours per day of interdisciplinary therapy in a comprehensive inpatient rehab setting.  Physiatrist is providing close team supervision and 24 hour management of active medical problems listed below.  Physiatrist and rehab team continue to assess barriers to  discharge/monitor patient progress toward functional and medical goals  Care Tool:  Bathing  Bathing activity did not occur: Safety/medical concerns     Body parts bathed by helper: Front perineal area, Buttocks     Bathing assist Assist Level: Total Assistance - Patient < 25%     Upper Body Dressing/Undressing Upper body dressing   What is the patient wearing?: Hospital gown only    Upper body assist Assist Level: Maximal Assistance - Patient 25 - 49%    Lower Body Dressing/Undressing Lower body dressing      What is the patient wearing?: Underwear/pull up, Hospital gown only     Lower body assist Assist for lower body dressing: Moderate Assistance - Patient 50 - 74%     Toileting Toileting    Toileting assist Assist for toileting: Minimal Assistance - Patient > 75%     Transfers Chair/bed transfer  Transfers assist  Chair/bed transfer activity did not occur: Safety/medical concerns  Chair/bed transfer assist level: Moderate Assistance - Patient 50 - 74%     Locomotion Ambulation   Ambulation assist      Assist level: Minimal Assistance - Patient > 75% Assistive device: Walker-rolling(R hand orthosis) Max distance: 10 ft   Walk 10 feet activity   Assist     Assist level: Minimal Assistance - Patient > 75% Assistive device: Walker-rolling   Walk 50 feet activity   Assist Walk 50 feet with 2 turns activity did not occur: Safety/medical concerns         Walk 150 feet activity   Assist Walk 150 feet activity did not occur: Safety/medical concerns         Walk 10 feet on uneven surface  activity   Assist Walk 10 feet on uneven surfaces activity did not occur: Safety/medical concerns         Wheelchair     Assist Will patient use wheelchair at discharge?: No Type of Wheelchair: Manual    Wheelchair assist level: Maximal Assistance - Patient 25 -  49% Max wheelchair distance: 5 ft    Wheelchair 50 feet with 2 turns  activity    Assist    Wheelchair 50 feet with 2 turns activity did not occur: Safety/medical concerns       Wheelchair 150 feet activity     Assist Wheelchair 150 feet activity did not occur: Safety/medical concerns        Medical Problem List and Plan: 1.Decreased functional mobilitysecondary to cervical stenosis with myelopathy status with weakness in a central cord pattern. She is status post decompressive anterior cervical discectomy 03/26/2019.No cervical collar needed -Patient is beginning CIR therapies today including PT and OT   -adhesive capsulitis right shoulder---aggressive ROM with therapy   -alnalgesic balm helped somewhat   -consider injection in AM if no improvement today 2. Antithrombotics: -DVT/anticoagulation:SCDs. dopplers performed and ?negative -antiplatelet therapy: N/A 3. Pain Management:Oxycodone and Robaxin as needed  -kpad for neck 4. Mood:needs a lot of + reinforcement -antipsychotic agents: N/A 5. Neuropsych: This patientiscapable of making decisions on hisown behalf. 6. Skin/Wound Care:Routine skin checks 7. Fluids/Electrolytes/Nutrition: encourage PO  Continue to push po  -protein for low albumin 8.Diabetes mellitus with peripheral neuropathy. Hemoglobin A1c 6.6. SSI, Glucophage 1000 mg twice daily, Glucotrol XL 5 mg daily if blood sugar greater than 150.    -decadron taper, sugars should continue to improve as decadron decreases 9. Hypertension. Lisinopril 5 mg daily 10. Constipation/neurogenic bowel. -working toward daily BM--none since 5/12 in PM  -want AM program  LOS: 3 days A FACE TO [place] 04/01/2019, 9:36 AM

## 2019-04-01 NOTE — Progress Notes (Signed)
Physical Therapy Session Note  Patient Details  Name: Teresa Barnes MRN: [id number] Date of Birth: 11-27-1945  Today's Date: 04/01/2019 PT Individual Time: 1430-1529 PT Individual Time Calculation (min): 59 min   Short Term Goals: Week 1:  PT Short Term Goal 1 (Week 1): Pt will consistently complete bed mobility with min assist without hospital bed features.  PT Short Term Goal 2 (Week 1): Pt will consistently complete transfers with min assist & LRAD. PT Short Term Goal 3 (Week 1): Pt will ambulate 50 ft with LRAD & min assist.  PT Short Term Goal 4 (Week 1): Pt will negotiate 4 steps with R rail to simulate home entry with mod assist.   Skilled Therapeutic Interventions/Progress Updates:      Therapy Documentation Precautions:  Precautions Precautions: Cervical, Fall Precaution Booklet Issued: Yes (comment) Precaution Comments: reviewed precautions Restrictions Weight Bearing Restrictions: No    Treatment: Pt in bathroom getting back into wheelchair with NT assist from toileting on arrival to room. PTA brought pt out of bathroom via wheelchair.  Agreeable to PT at this time with encouragement as she reports being "very tired". Denies any pain at this time.  Exercises: with yellow theraband resistance pt performed hamstring curls and hip abd/add for 2 sets of 10 reps bil LE's; then with 1# ankle weight pt performed long arc quads and marching for 2 sets of 10 reps with cues for form and technique.   Gait: 20 feet x 4 reps with RW with right hand orthosis. Cues needed to ensure strap was velcroed on orthosis each time. Cues/facilitation for posture, weight shifting and walker position. Mostly min guard assist with occasional need of min assist with turns. Rest break between each bout of gait. Pt has decreased stride/step length and significantly decreased gait speed.   Transfers/bed mobility: min guard assist for each sit<>stand transfer with reminder cues for hand placement and  weight shifting. Min assist to go from sitting edge of bed to sidelying in bed. Assist needed to clear bed surface with bil LE's. Min guard assist to roll into supine.   Pt left in bed with bed alarm on and all needs in reach.     Therapy/Group: Individual Therapy  Lindon Romp, PTA, CLT 04/01/19, 6:06 PM 04/01/2019, 6:06 PM

## 2019-04-01 NOTE — Progress Notes (Signed)
Occupational Therapy Note  Patient Details  Name: Teresa Barnes MRN: [id number] Date of Birth: 03/29/1946  Today's Date: 04/01/2019 OT Missed Time: 46 Minutes Missed Time Reason: Other (comment)(eating)  Pt missed 30 min skilled OT d/t pt eating lunch during scheduled session. OT attempted to make up time later in afternoon after PT session, but pt declines d/t fatigue.    [place] 04/01/2019, 4:06 PM

## 2019-04-01 NOTE — Progress Notes (Signed)
Occupational Therapy Session Note  Patient Details  Name: Teresa Barnes MRN: [id number] Date of Birth: 02/01/1946  Today's Date: 04/01/2019 OT Individual Time: 1100-1130 OT Individual Time Calculation (min): 30 min    Short Term Goals: Week 1:  OT Short Term Goal 1 (Week 1): Pt will don shirt with min A OT Short Term Goal 2 (Week 1): Pt will use LRAD to don pants with min A OT Short Term Goal 3 (Week 1): Pt will transfer into walk in shower with min A OT Short Term Goal 4 (Week 1): Pt will increase R functional grasp to hold toothbrush  Skilled Therapeutic Interventions/Progress Updates:    1:1. Pt greeted in bed ready to go. Pt with no c/o pain. Pt compltes supine to sit with MOD A. Pt compltes stand pivot transfer with MOD A with RW and VC for reach back. PT washes face with supervision and VC for oral care completed with VC for compensatory strategies d/t decreased FMC and VC for termination.   Hand dynamometer used to measure grip strength as follows:  RUE 21, 22, 35 LUE 35, 25, 30  ecited session with pt seated in w/c, call lighitn reach exit alarm on and all needs met  Therapy Documentation Precautions:  Precautions Precautions: Cervical, Fall Precaution Booklet Issued: Yes (comment) Precaution Comments: reviewed precautions Restrictions Weight Bearing Restrictions: No General:     Therapy/Group: Individual Therapy  Stephanie M Schlosser 04/01/2019, 11:13 AM 

## 2019-04-01 NOTE — IPOC Note (Signed)
Overall Plan of Care [facility]) Patient Details Name: Teresa Barnes MRN: [id number] DOB: 1946-07-31  Admitting Diagnosis: <principal problem not specified>  Hospital Problems: Active Problems:   Cervical myelopathy [facility])     Functional Problem List: Nursing Bowel, Bladder, Edema, Endurance, Motor, Sensory, Skin Integrity  PT Balance, Motor, Safety, Behavior, Pain, Skin Integrity, Endurance  OT Balance, Sensory, Motor, Pain, Endurance, Safety  SLP    TR         Basic ADL's: OT Eating, Grooming, Dressing, Bathing, Toileting     Advanced  ADL's: OT       Transfers: PT Bed Mobility, Bed to Chair, Furniture, Teacher, early years/pre, Metallurgist: PT Ambulation, Emergency planning/management officer, Stairs     Additional Impairments: OT Fuctional Use of Upper Extremity  SLP        TR      Anticipated Outcomes Item Anticipated Outcome  Self Feeding set up  Swallowing      Basic self-care  (S)  Toileting  (S)   Bathroom Transfers (S)  Bowel/Bladder  pt will manage bowel and bladder with min assist  Transfers  supervision with LRAD  Locomotion  supervision with LRAD, except CGA for stair negotiation  Communication     Cognition     Pain  pt will manage pain at 2 or less  Safety/Judgment  pt will remain free of falls with injury while in rehab with min assist   Therapy Plan: PT Intensity: Minimum of 1-2 x/day ,45 to 90 minutes PT Frequency: 5 out of 7 days PT Duration Estimated Length of Stay: 2-2.5 weeks OT Intensity: Minimum of 1-2 x/day, 45 to 90 minutes OT Frequency: 5 out of 7 days OT Duration/Estimated Length of Stay: 10-14 days     Due to the current state of emergency, patients may not be receiving their 3-hours of Medicare-mandated therapy.   Team Interventions: Nursing Interventions Patient/Family Education, Bladder Management, Bowel Management, Disease Management/Prevention, Medication Management, Skin Care/Wound Management, Discharge Planning  PT  interventions Ambulation/gait training, Discharge planning, DME/adaptive equipment instruction, Functional mobility training, Cognitive remediation/compensation, Pain management, Psychosocial support, Therapeutic Activities, Splinting/orthotics, UE/LE Strength taining/ROM, Visual/perceptual remediation/compensation, Wheelchair propulsion/positioning, UE/LE Coordination activities, Therapeutic Exercise, Stair training, Skin care/wound management, Patient/family education, Neuromuscular re-education, Functional electrical stimulation, Disease management/prevention, Academic librarian, Training and development officer  OT Interventions Training and development officer, Community reintegration, Disease mangement/prevention, Brewing technologist, Barrister's clerk education, Self Care/advanced ADL retraining, Therapeutic Exercise, UE/LE Coordination activities, Discharge planning, DME/adaptive equipment instruction, Functional mobility training, Pain management, Psychosocial support, Therapeutic Activities, UE/LE Strength taining/ROM  SLP Interventions    TR Interventions    SW/CM Interventions Discharge Planning, Psychosocial Support, Patient/Family Education   Barriers to Discharge MD  Medical stability  Nursing Medication compliance    PT      OT      SLP      SW       Team Discharge Planning: Destination: PT-Home ,OT- Home , SLP-  Projected Follow-up: PT-24 hour supervision/assistance, Home health PT, OT-  Outpatient OT, SLP-  Projected Equipment Needs: PT-To be determined, OT- To be determined, SLP-  Equipment Details: PT- , OT-  Patient/family involved in discharge planning: PT- Patient,  OT-Patient, SLP-   MD ELOS: 13-16 days Medical Rehab Prognosis:  Excellent Assessment: The patient has been admitted for CIR therapies with the diagnosis of cervical myelopathy. The team will be addressing functional mobility, strength, stamina, balance, safety, adaptive techniques and equipment,  self-care, bowel and bladder mgt, patient and caregiver education, pain  mgt, NMR, community reentry, ego support. Goals have been set at supervision for self-care and ADL's as well as mobility at ambulatory level.   Due to the current state of emergency, patients may not be receiving their 3 hours per day of Medicare-mandated therapy.    [name], MD, FAAPMR      See Team Conference Notes for weekly updates to the plan of care

## 2019-04-01 NOTE — Progress Notes (Signed)
Social Work Assessment and Plan  Patient Details  Name: Teresa Barnes MRN: [id number] Date of Birth: 1946/02/17  Today's Date: 03/30/2019  Problem List:  Patient Active Problem List   Diagnosis Date Noted  . Cervical myelopathy ([place]) 03/29/2019  . S/P cervical spinal fusion 03/26/2019  . Anemia of chronic disease 09/24/2018  . Piriformis syndrome of right side 09/10/2018  . Adhesive bursitis of left shoulder 08/05/2018  . History of right breast cancer 12/09/2017  . Mixed hyperlipidemia 12/09/2017  . Hypertension associated with diabetes ([place]) 12/09/2017  . Hiatal hernia 12/09/2017  . Esophageal stricture 12/09/2017  . Chronic gout 12/09/2017  . Glaucoma 12/09/2017  . Greater trochanteric bursitis of right hip 11/17/2017  . Degenerative arthritis of right knee 07/03/2017  . Normochromic anemia   . H/O: upper GI bleed 07/30/2016  . Right lumbar radiculopathy 01/23/2016  . Arthritis of right acromioclavicular joint 01/02/2016  . Controlled type 2 diabetes mellitus without complication, without long-term current use of insulin ([place]) 04/03/2009   Past Medical History:  Past Medical History:  Diagnosis Date  . Anemia   . Anemia of chronic disease 09/24/2018  . Anxiety   . Blood transfusion without reported diagnosis 07/2016  . Cancer ([place]) 1991   breast  . Chronic gout 12/09/2017   On allopurinol  . Diabetes mellitus without complication ([place])   . Esophageal stricture 12/09/2017   S/p dilation by GI: Dr. Scarlette Shorts; due to h/o severe recurrent erosive esophagitis.  . 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 GERD (gastroesophageal reflux disease)   . Glaucoma   . Hiatal hernia 12/09/2017  . Hyperlipidemia   . Hypertension   . Right rotator cuff tear 11/24/2015   Injected this and 26 2017  . Wears glasses    Past Surgical History:  Past Surgical History:  Procedure Laterality Date  . ANTERIOR CERVICAL DECOMP/DISCECTOMY FUSION N/A 03/26/2019    Procedure: Anterior Cervical Discectomy Fusion - Cervical Three-Cervical Four - Cervical Four-Cervical Five - Cervical Five-Cervical Six;  Surgeon: Eustace Moore, MD;  Location: [facility];  Service: Neurosurgery;  Laterality: N/A;  Anterior Cervical Discectomy Fusion - Cervical Three-Cervical Four - Cervical Four-Cervical Five - Cervical Five-Cervical Six  . CATARACT EXTRACTION W/ INTRAOCULAR LENS  IMPLANT, BILATERAL  2000  . COLONOSCOPY    . cyst on spine    . ESOPHAGOGASTRODUODENOSCOPY (EGD) WITH PROPOFOL N/A 07/31/2016   Procedure: ESOPHAGOGASTRODUODENOSCOPY (EGD) WITH PROPOFOL;  Surgeon: Manus Gunning, MD;  Location: [facility];  Service: Gastroenterology;  Laterality: N/A;  . EYE SURGERY  2011   lt eye blled  . MASTECTOMY MODIFIED RADICAL Right 1990  . REDUCTION MAMMAPLASTY Left   . TRIGGER FINGER RELEASE Right 06/24/2013   Procedure: RELEASE TRIGGER FINGER/A-1 PULLEY PIP RIGHT LONG FINGER;  Surgeon: Cammie Sickle., MD;  Location: [facility];  Service: Orthopedics;  Laterality: Right;  . TRIGGER FINGER RELEASE    . TUBAL LIGATION     Social History:  reports that she quit smoking about 25 years ago. Her smoking use included cigarettes. She has a 2.50 pack-year smoking history. She has never used smokeless tobacco. She reports that she does not drink alcohol or use drugs.  Family / Support Systems Marital Status: Married How Long?: 48 years Patient Roles: Spouse, Parent, Other (Comment)(dtr) Spouse/Significant Other: Safiyya Stokes - husband - [phone] (h); [phone] (m) Children: Jaquana Geiger - son - [phone]; Delrae Rend - dtr Anticipated Caregiver:  husband Ability/Limitations of Caregiver: Min A Caregiver Availability: 24/7 Family Dynamics: supportive family  Social History Preferred language: English Religion: [employer] Education: college Read: Yes Write: Yes Employment Status: Retired(logitician) Date  Retired/Disabled/Unemployed: 2006 Age Retired: 58 Legal History/Current Legal Issues: none reported Guardian/Conservator: N/A - MD has determined that pt is capable of making her own decisions.   Abuse/Neglect Abuse/Neglect Assessment Can Be Completed: Yes Physical Abuse: Denies Verbal Abuse: Denies Sexual Abuse: Denies Exploitation of patient/patient's resources: Denies Self-Neglect: Denies  Emotional Status Pt's affect, behavior and adjustment status: Pt is motivated to get better and reports feeling positive, but is worried about her 93 y/o mother who she usually helps. Recent Psychosocial Issues: as above Psychiatric History: none reported Substance Abuse History: none reported  Patient / Family Perceptions, Expectations & Goals Pt/Family understanding of illness & functional limitations: Pt reports a good understanding of condition and CSW awaits pt's permission to talk with husband about her progress. Premorbid pt/family roles/activities: Pt and husband used to travel, but now she enjoys spending time in her yard/garden tending to her flowers and enjoying her home. Anticipated changes in roles/activities/participation: Pt hopes to resume activities as she is able. Pt/family expectations/goals: Pt wants to get home soon, but knows she needs to get a lot stronger so she can be safe at home.  Community Resources Community Agencies: None Premorbid Home Care/DME Agencies: Other (Comment)(walker; bedside commode) Transportation available at discharge: family Resource referrals recommended: Neuropsychology  Discharge Planning Living Arrangements: Spouse/significant other Support Systems: Spouse/significant other, Children, Parent, Other relatives, Friends/neighbors Type of Residence: Private residence Insurance Resources: Private Insurance (specify)([employer] Medicare) Financial Resources: Social Security, Family Support Financial Screen Referred: No Living Expenses:  Own Money Management: Spouse, Patient Does the patient have any problems obtaining your medications?: No Home Management: Pt and husband share household chores.  Husband should be able to manage while pt is recovering.  Son/dtr can assist as needed. Patient/Family Preliminary Plans: Pt plans to return to her home with her husband to provide supervision and contact guard assist for stairs. Social Work Anticipated Follow Up Needs: HH/OP Expected length of stay: 20 days  Clinical Impression CSW met with pt to introduce self and role of CSW, as well as to complete assessment.  Pt was open with CSW and shared her concerns and desire for community resources for her mother who is 93y/o.  CSW will give her some handouts.  Pt has good family support from husband and children.  She is motivated to get better and is already seeing improvement.  Pt was hopeful for quick stay, but she will need around 20 days on CIR.  She was accepting of this, but was clearly disappointed and hopes to work hard and shorten that time.  CSW did not contact husband or son yet at pt's request.  She wants to communicate information for now.  CSW will need to speak to them prior to d/c and pt expressed understanding.  CSW will continue to follow and assist as needed.  Prevatt, Jennifer Capps 03/30/2019, 5:46 PM   

## 2019-04-02 ENCOUNTER — Inpatient Hospital Stay (HOSPITAL_COMMUNITY): Payer: Medicare Other | Admitting: Physical Therapy

## 2019-04-02 ENCOUNTER — Inpatient Hospital Stay (HOSPITAL_COMMUNITY): Payer: Medicare Other

## 2019-04-02 DIAGNOSIS — M75101 Unspecified rotator cuff tear or rupture of right shoulder, not specified as traumatic: Secondary | ICD-10-CM

## 2019-04-02 LAB — GLUCOSE, CAPILLARY
Glucose-Capillary: 235 mg/dL — ABNORMAL HIGH (ref 70–99)
Glucose-Capillary: 201 mg/dL — ABNORMAL HIGH (ref 70–99)
Glucose-Capillary: 129 mg/dL — ABNORMAL HIGH (ref 70–99)
Glucose-Capillary: 143 mg/dL — ABNORMAL HIGH (ref 70–99)

## 2019-04-02 MED ORDER — LIDOCAINE HCL (PF) 1 % IJ SOLN
5.0000 mL | Freq: Once | INTRAMUSCULAR | Status: DC
  Filled 2019-04-02: qty 5

## 2019-04-02 MED ORDER — SORBITOL 70 % SOLN
60.0000 mL | Status: AC
  Administered 2019-04-02: 60 mL via ORAL
  Filled 2019-04-02: qty 60

## 2019-04-02 MED ORDER — TRIAMCINOLONE ACETONIDE 40 MG/ML IJ SUSP
40.0000 mg | Freq: Once | INTRAMUSCULAR | Status: DC
  Filled 2019-04-02: qty 1

## 2019-04-02 NOTE — Progress Notes (Signed)
Physical Therapy Session Note  Patient Details  Name: Teresa Barnes MRN: [id number] Date of Birth: 08/05/1946  Today's Date: 04/02/2019 PT Individual Time: 3888-2800 and 3491-7915 PT Individual Time Calculation (min): 24 min and 8 min  Short Term Goals: Week 1:  PT Short Term Goal 1 (Week 1): Pt will consistently complete bed mobility with min assist without hospital bed features.  PT Short Term Goal 2 (Week 1): Pt will consistently complete transfers with min assist & LRAD. PT Short Term Goal 3 (Week 1): Pt will ambulate 50 ft with LRAD & min assist.  PT Short Term Goal 4 (Week 1): Pt will negotiate 4 steps with R rail to simulate home entry with mod assist.   Skilled Therapeutic Interventions/Progress Updates:  Treatment 1: Pt received in w/c reporting "I thought I was done" for the day with therapist providing pt with written schedule & pt agreeable to tx. Pt reports unrated R shoulder & LE pain, stating "it's just there", but declining requesting pain meds 2/2 pt states they make her dizzy. Pt reports need to use restroom and transfers sit<>stand from w/c and elevated seat over toilet with min assist with occasional cuing for safe hand placement. Pt very internally distracted & frustrated, reporting she's also fatigued - therapist provides cuing for pt to attend to task at hand & encouragement for participation but pt continues to be internally distracted. Pt attempts to perform clothing management standing at toilet but ultimately requires assistance. Pt with continent void on toilet then ambulates in room/bathroom with RW, R hand orthosis, and min assist with cuing for increased step width LLE & RW management over uneven bathroom threshold. Pt performs hand hygiene standing at sink with min assist for standing balance. When pt returns to bed pt transfers sit>L sidelying with mod assist to place BLE onto bed with cuing for log rolling technique. Pt very frustrated, reporting her wish to rest. Pt  left in bed in care of NT. Pt missed 36 minutes of skilled PT treatment 2/2 fatigue & unwillingness to participate.   Treatment 2: Pt received in bed, very upset re recent bowel movement. Pt reports feeling unwell & wanting to cool off. RN clears pt for participation in therapy & pt made aware. Encouraged pt to perform bed level exercises but pt continues to decline, reporting she needs to rest. Offered pt cold cloth for forehead & adjusting temperature in room but pt declines. Pt missed 52 minutes of skilled PT treatment 2/2 feeling unwell & unwilling to participate.  Therapy Documentation Precautions:  Precautions Precautions: Cervical, Fall Precaution Booklet Issued: Yes (comment) Precaution Comments: reviewed precautions Restrictions Weight Bearing Restrictions: No   General: PT Amount of Missed Time (min): 36 Minutes + 52 Minutes PT Missed Treatment Reason: Patient unwilling to participate;Patient ill (Comment)    Therapy/Group: Individual Therapy  [name] 04/02/2019, 2:16 PM

## 2019-04-02 NOTE — Progress Notes (Signed)
[facility] PHYSICAL MEDICINE & REHABILITATION PROGRESS NOTE   Subjective/Complaints: Had a reasonable night. Right shoulder still tender. Making gains with therapy however  ROS: Patient denies fever, rash, sore throat, blurred vision, nausea, vomiting, diarrhea, cough, shortness of breath or chest pain, joint or back pain, headache, or mood change.    Objective:   No results found. No results for input(s): WBC, HGB, HCT, PLT in the last 72 hours. No results for input(s): NA, K, CL, CO2, GLUCOSE, BUN, CREATININE, CALCIUM in the last 72 hours.  Intake/Output Summary (Last 24 hours) at 04/02/2019 0919 Last data filed at 04/02/2019 0700 Gross per 24 hour  Intake 577 ml  Output 800 ml  Net -223 ml     Physical Exam: Vital Signs Blood pressure (!) 148/51, pulse (!) 58, temperature (!) 97.5 F (36.4 C), temperature source Oral, resp. rate 18, height 5\' 4"  (1.626 m), weight 80.7 kg, SpO2 100 %. Constitutional: No distress . Vital signs reviewed. HEENT: EOMI, oral membranes moist Neck: supple Cardiovascular: RRR without murmur. No JVD    Respiratory: CTA Bilaterally without wheezes or rales. Normal effort    GI: BS +, non-tender, non-distended  Musculoskeletal:neck tender, sitting agst kpad  General: tr to 1+ edema RUE.  Comments: right shoulder remains with improved but still painful  ROM Neurological: She is alertand oriented to person, place, and time. Nocranial nerve deficit. Normal insight and awareness, RUE 2+/5 prox to 3+/5 distally. LUE 3/5 prox to 3+ to 4/5 distally. LE 4/5 prox to distal. Decreased sensation R>L to LT/pain continues. DTR's 1+ UE and 2+ LE. ---generally stable exam Skin: Skin iswarm. Noerythema.  Marland Kitchen Psychiatric:pleasant   Assessment/Plan: 1. Functional deficits secondary to cervical myelopathy which require 3+ hours per day of interdisciplinary therapy in a comprehensive inpatient rehab setting.  Physiatrist is providing close team  supervision and 24 hour management of active medical problems listed below.  Physiatrist and rehab team continue to assess barriers to discharge/monitor patient progress toward functional and medical goals  Care Tool:  Bathing  Bathing activity did not occur: Safety/medical concerns     Body parts bathed by helper: Front perineal area, Buttocks     Bathing assist Assist Level: Total Assistance - Patient < 25%     Upper Body Dressing/Undressing Upper body dressing   What is the patient wearing?: Hospital gown only    Upper body assist Assist Level: Maximal Assistance - Patient 25 - 49%    Lower Body Dressing/Undressing Lower body dressing      What is the patient wearing?: Underwear/pull up, Hospital gown only     Lower body assist Assist for lower body dressing: Moderate Assistance - Patient 50 - 74%     Toileting Toileting    Toileting assist Assist for toileting: Moderate Assistance - Patient 50 - 74%     Transfers Chair/bed transfer  Transfers assist  Chair/bed transfer activity did not occur: Safety/medical concerns  Chair/bed transfer assist level: Minimal Assistance - Patient > 75%     Locomotion Ambulation   Ambulation assist      Assist level: Minimal Assistance - Patient > 75% Assistive device: Walker-rolling(right hand orthosis) Max distance: 15   Walk 10 feet activity   Assist     Assist level: Minimal Assistance - Patient > 75% Assistive device: Walker-rolling   Walk 50 feet activity   Assist Walk 50 feet with 2 turns activity did not occur: Safety/medical concerns         Walk 150 feet  activity   Assist Walk 150 feet activity did not occur: Safety/medical concerns         Walk 10 feet on uneven surface  activity   Assist Walk 10 feet on uneven surfaces activity did not occur: Safety/medical concerns         Wheelchair     Assist Will patient use wheelchair at discharge?: No Type of Wheelchair: Manual     Wheelchair assist level: Maximal Assistance - Patient 25 - 49% Max wheelchair distance: 5 ft    Wheelchair 50 feet with 2 turns activity    Assist    Wheelchair 50 feet with 2 turns activity did not occur: Safety/medical concerns       Wheelchair 150 feet activity     Assist Wheelchair 150 feet activity did not occur: Safety/medical concerns        Medical Problem List and Plan: 1.Decreased functional mobilitysecondary to cervical stenosis with myelopathy status with weakness in a central cord pattern. She is status post decompressive anterior cervical discectomy 03/26/2019.No cervical collar needed -Patient is beginning CIR therapies today including PT and OT   -adhesive capsulitis right shoulder---aggressive ROM with therapy has already paid dividends   -alnalgesic balm helped somewhat   -After informed consent and preparation of the skin with betadine and isopropyl alcohol, I injected 40mg  kenalog and 4cc of 1% lidocaine into the right subacromial space via posterior approach. Additionally, aspiration was performed prior to injection. The patient tolerated well, and no complications were encountered. Afterward the area was cleaned and dressed. Post- injection instructions were provided.   2. Antithrombotics: -DVT/anticoagulation:SCDs. dopplers performed and ?negative -antiplatelet therapy: N/A 3. Pain Management:Oxycodone and Robaxin as needed  -kpad for neck  -right shoulder injection 4. Mood:needs a lot of + reinforcement -antipsychotic agents: N/A 5. Neuropsych: This patientiscapable of making decisions on hisown behalf. 6. Skin/Wound Care:Routine skin checks 7. Fluids/Electrolytes/Nutrition: encourage PO  Continue to push po  -protein for low albumin 8.Diabetes mellitus with peripheral neuropathy. Hemoglobin A1c 6.6. SSI, Glucophage 1000 mg twice daily, Glucotrol XL 5 mg daily if blood sugar greater than 150.     -decadron taper to q12 hours today 9. Hypertension. Lisinopril 5 mg daily 10. Constipation/neurogenic bowel. -working toward daily BM--none since 5/12 in PM  -want AM program  -sorbitol today, SSE if needed  LOS: 4 days A FACE TO [place] 04/02/2019, 9:19 AM

## 2019-04-02 NOTE — Progress Notes (Signed)
Occupational Therapy Session Note  Patient Details  Name: Teresa Barnes MRN: [id number] Date of Birth: Dec 16, 1945  Today's Date: 04/02/2019 OT Individual Time: 0700-0800 OT Individual Time Calculation (min): 60 min    Short Term Goals: Week 1:  OT Short Term Goal 1 (Week 1): Pt will don shirt with min A OT Short Term Goal 2 (Week 1): Pt will use LRAD to don pants with min A OT Short Term Goal 3 (Week 1): Pt will transfer into walk in shower with min A OT Short Term Goal 4 (Week 1): Pt will increase R functional grasp to hold toothbrush  Skilled Therapeutic Interventions/Progress Updates:    1:1. Pt received in bd agreeable tto shower. Pt with no c/o pain. Pt completes stand pivot transfer with RW and grab bar with MOD fading to min A. Pt able to manage clothing prior to toileting with MIN A for balance. Pt completes bathing with HOH A to wash L underarm and back. Pt often dropping washcloth with LUE frustrating pt. Pt completes grooming at sink with HOH A to apply to LUE and VC for resting RUE onto sink to apply to R armpit. Pt completes dressing UB with min A and pants with max A. Pt able to thread 1LE into underwear with max VC while LE elevated on stool. Total A for donning socks d/t time limitation. Exited session with pt seated in w/c, call light in reach and belt alarm on set up with breakfast.  Therapy Documentation Precautions:  Precautions Precautions: Cervical, Fall Precaution Booklet Issued: Yes (comment) Precaution Comments: reviewed precautions Restrictions Weight Bearing Restrictions: No General:   Vital Signs: Therapy Vitals Temp: (!) 97.5 F (36.4 C) Temp Source: Oral Pulse Rate: (!) 58 Resp: 18 BP: (!) 148/51 Patient Position (if appropriate): Lying Oxygen Therapy SpO2: 100 % O2 Device: Room Air Pain:   ADL:   Vision   Perception    Praxis   Exercises:   Other Treatments:     Therapy/Group: Individual Therapy  Tonny Branch 04/02/2019, 7:40 AM

## 2019-04-03 ENCOUNTER — Inpatient Hospital Stay (HOSPITAL_COMMUNITY): Payer: Medicare Other

## 2019-04-03 ENCOUNTER — Inpatient Hospital Stay (HOSPITAL_COMMUNITY): Payer: Medicare Other | Admitting: Physical Therapy

## 2019-04-03 LAB — GLUCOSE, CAPILLARY
Glucose-Capillary: 196 mg/dL — ABNORMAL HIGH (ref 70–99)
Glucose-Capillary: 244 mg/dL — ABNORMAL HIGH (ref 70–99)
Glucose-Capillary: 217 mg/dL — ABNORMAL HIGH (ref 70–99)
Glucose-Capillary: 299 mg/dL — ABNORMAL HIGH (ref 70–99)

## 2019-04-03 NOTE — Progress Notes (Signed)
Teresa Barnes is a 73 y.o. female admitted for CIR with functional deficits secondary to cervical stenosis with myelopathy  Past Medical History:  Diagnosis Date  . Anemia   . Anemia of chronic disease 09/24/2018  . Anxiety   . Blood transfusion without reported diagnosis 07/2016  . Cancer ([place]) 1991   breast  . Chronic gout 12/09/2017   On allopurinol  . Diabetes mellitus without complication ([place])   . Esophageal stricture 12/09/2017   S/p dilation by GI: Dr. Scarlette Shorts; due to h/o severe recurrent erosive esophagitis.  . 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 GERD (gastroesophageal reflux disease)   . Glaucoma   . Hiatal hernia 12/09/2017  . Hyperlipidemia   . Hypertension   . Right rotator cuff tear 11/24/2015   Injected this and 26 2017  . Wears glasses      Subjective: No new complaints. No new problems. Slept well.  States that pain and mobility of right shoulder much improved following intra-articular cortisone injection  Objective: Vital signs in last 24 hours: Temp:  [97.9 F (36.6 C)-98.3 F (36.8 C)] 97.9 F (36.6 C) (05/16 0407) Pulse Rate:  [48-58] 58 (05/16 0411) Resp:  [16-18] 16 (05/16 0407) BP: (105-147)/(46-52) 142/48 (05/16 0411) SpO2:  [99 %-100 %] 100 % (05/16 0407) Weight change:  Last BM Date: 04/03/19  Intake/Output from previous day: 05/15 0701 - 05/16 0700 In: 360 [P.O.:360] Out: -  Last cbgs: CBG (last 3)  Recent Labs    04/02/19 1628 04/02/19 2125 04/03/19 0639  GLUCAP 129* 143* 196*   Patient Vitals for the past 24 hrs:  BP Temp Temp src Pulse Resp SpO2  04/03/19 0411 (!) 142/48 - - (!) 58 - -  04/03/19 0407 (!) 141/47 97.9 F (36.6 C) Oral (!) 48 16 100 %  04/02/19 1909 (!) 147/52 98.3 F (36.8 C) Oral (!) 55 16 99 %  04/02/19 1439 (!) 105/46 98 F (36.7 C) Oral (!) 53 18 100 %     Physical Exam General: No apparent distress   HEENT: not dry Lungs: Normal effort. Lungs clear to  auscultation, no crackles or wheezes. Cardiovascular: Regular rate and rhythm, no edema Abdomen: S/NT/ND; BS(+) Musculoskeletal: Improved range of motion right shoulder Neurological: No new neurological deficits with upper extremity weakness Wounds: Healing incision right anterior neck Skin: clear  Aging changes Mental state: Alert, oriented, cooperative    Lab Results: BMET    Component Value Date/Time   NA 137 03/30/2019 0638   K 4.6 03/30/2019 0638   CL 104 03/30/2019 0638   CO2 23 03/30/2019 0638   GLUCOSE 253 (H) 03/30/2019 0638   BUN 28 (H) 03/30/2019 0638   CREATININE 1.11 (H) 03/30/2019 0638   CALCIUM 9.3 03/30/2019 0638   GFRNONAA 49 (L) 03/30/2019 0638   GFRAA 57 (L) 03/30/2019 0638   CBC    Component Value Date/Time   WBC 7.9 03/30/2019 0638   RBC 3.07 (L) 03/30/2019 0638   HGB 9.4 (L) 03/30/2019 0638   HCT 27.8 (L) 03/30/2019 0638   PLT 339 03/30/2019 0638   MCV 90.6 03/30/2019 0638   MCH 30.6 03/30/2019 0638   MCHC 33.8 03/30/2019 0638   RDW 13.5 03/30/2019 0638   LYMPHSABS 0.8 03/30/2019 0638   MONOABS 0.3 03/30/2019 0638   EOSABS 0.0 03/30/2019 0638   BASOSABS 0.0 03/30/2019 0638    Medications: I have reviewed the patient's current medications.  Assessment/Plan:  Functional  deficits secondary to cervical myelopathy.  Continue CIR Status post decompressive anterior cervical discectomy 03/26/2019 Adhesive capsulitis right shoulder improved DVT prophylaxis.  Continue SCDs  Hypertension.  Stable  Length of stay, days: 5  Marletta Lor , MD 04/03/2019, 11:14 AM

## 2019-04-03 NOTE — Progress Notes (Signed)
Occupational Therapy Session Note  Patient Details  Name: Teresa Barnes MRN: [id number] Date of Birth: 05-06-1946  Today's Date: 04/03/2019 OT Individual Time: 8329-1916 OT Individual Time Calculation (min): 57 min    Short Term Goals: Week 1:  OT Short Term Goal 1 (Week 1): Pt will don shirt with min A OT Short Term Goal 2 (Week 1): Pt will use LRAD to don pants with min A OT Short Term Goal 3 (Week 1): Pt will transfer into walk in shower with min A OT Short Term Goal 4 (Week 1): Pt will increase R functional grasp to hold toothbrush  Skilled Therapeutic Interventions/Progress Updates:    1;1. Pt received in w/c wanting to void bladder. Pt completes stand pivot transfer with MIN A using grab bar to toilet and shower. Pt toilets with A for CM and bathes with Supervision. Pt completes dressing with A for threading shirt overhead and VC for reacher use to thread BLE into pants. Pt standing with MIN A and OT advances pnats past hips. Pt completes oral care seated with setup. Exited session with pt seated in w/c, call light in reach and all neds et  Therapy Documentation Precautions:  Precautions Precautions: Cervical, Fall Precaution Booklet Issued: Yes (comment) Precaution Comments: reviewed precautions Restrictions Weight Bearing Restrictions: No General:   Vital Signs: Therapy Vitals Temp: 98.5 F (36.9 C) Temp Source: Oral Pulse Rate: (!) 57 BP: (!) 120/41 Patient Position (if appropriate): Sitting Oxygen Therapy SpO2: 97 % O2 Device: Room Air Pain:   ADL:   Vision   Perception    Praxis   Exercises:   Other Treatments:     Therapy/Group: Individual Therapy  Tonny Branch 04/03/2019, 2:59 PM

## 2019-04-03 NOTE — Progress Notes (Signed)
Physical Therapy Session Note  Patient Details  Name: Teresa Barnes MRN: [id number] Date of Birth: [birth year]  Today's Date: 04/03/2019 PT Individual Time: 1006-1105 PT Individual Time Calculation (min): 59 min   Short Term Goals: Week 1:  PT Short Term Goal 1 (Week 1): Pt will consistently complete bed mobility with min assist without hospital bed features.  PT Short Term Goal 2 (Week 1): Pt will consistently complete transfers with min assist & LRAD. PT Short Term Goal 3 (Week 1): Pt will ambulate 50 ft with LRAD & min assist.  PT Short Term Goal 4 (Week 1): Pt will negotiate 4 steps with R rail to simulate home entry with mod assist.   Skilled Therapeutic Interventions/Progress Updates:   Pt received sitting in w/c and agreeable to therapy session. Performed LB clothing with max assist and sit<>stand w/c<>RW with mod assist for lifting - pt able to maintain static standing balance with BUE support on RW and intermittent CGA. Transported to/from gym in w/c. Performed sit<>stand wc to RW/stair handrails with min assist for lifting the remainder of session. Ambulated 75ft, 59ft (sitting break between) using RW with min assist for balance - continues to demonstrate significantly decreased gait speed with decreased B LE step length and foot clearance resulting in shuffling steps. Performed repeated step up on 6" step with B UE support on handrails x10 repetitions each LE - min/mod assist throughout for lifting/balance - demonstrates increased difficulty stepping R LE up/down onto step due to hip flexor weakness. Transported back to room in w/c as pt request for toileting. Stand pivot transfer w/c<>BSC over toilet with min assist/CGA for balance/steadying. Pt continent of bladder. Pt able to maintain static standing balance with B UE support on grab bars and intermittent CGA during total assist LB clothing management and peri-care. Transported back to room in w/c and left sitting in w/c with needs in  reach and seat belt alarm on.  Therapy Documentation Precautions:  Precautions Precautions: Cervical, Fall Precaution Booklet Issued: Yes (comment) Precaution Comments: reviewed precautions Restrictions Weight Bearing Restrictions: No  Pain:  Denies pain during session; however, often requesting cold water to soothe her throat.    Therapy/Group: Individual Therapy  Tawana Scale, PT, DPT 04/03/2019, 7:51 AM

## 2019-04-04 LAB — GLUCOSE, CAPILLARY
Glucose-Capillary: 218 mg/dL — ABNORMAL HIGH (ref 70–99)
Glucose-Capillary: 162 mg/dL — ABNORMAL HIGH (ref 70–99)
Glucose-Capillary: 257 mg/dL — ABNORMAL HIGH (ref 70–99)
Glucose-Capillary: 302 mg/dL — ABNORMAL HIGH (ref 70–99)
Glucose-Capillary: 303 mg/dL — ABNORMAL HIGH (ref 70–99)

## 2019-04-04 NOTE — Progress Notes (Signed)
Teresa Barnes is a 73 y.o. female admitted for CIR with functional deficits secondary to cervical stenosis and associated myelopathy  Past Medical History:  Diagnosis Date  . Anemia   . Anemia of chronic disease 09/24/2018  . Anxiety   . Blood transfusion without reported diagnosis 07/2016  . Cancer ([place]) 1991   breast  . Chronic gout 12/09/2017   On allopurinol  . Diabetes mellitus without complication ([place])   . Esophageal stricture 12/09/2017   S/p dilation by GI: Dr. Scarlette Shorts; due to h/o severe recurrent erosive esophagitis.  . 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 GERD (gastroesophageal reflux disease)   . Glaucoma   . Hiatal hernia 12/09/2017  . Hyperlipidemia   . Hypertension   . Right rotator cuff tear 11/24/2015   Injected this and 26 2017  . Wears glasses      Subjective: No new complaints. No new problems.  Dressed and sitting up in chair  Objective: Vital signs in last 24 hours: Temp:  [97.9 F (36.6 C)-98.5 F (36.9 C)] 97.9 F (36.6 C) (05/17 0443) Pulse Rate:  [57-63] 57 (05/17 0443) Resp:  [18] 18 (05/16 1948) BP: (120-165)/(41-54) 165/51 (05/17 0443) SpO2:  [97 %-100 %] 100 % (05/17 0443) Weight change:  Last BM Date: 04/03/19  Intake/Output from previous day: 05/16 0701 - 05/17 0700 In: 81 [P.O.:460] Out: -  Last cbgs: CBG (last 3)  Recent Labs    04/03/19 1640 04/03/19 2119 04/04/19 0638  GLUCAP 217* 299* 218*   Patient Vitals for the past 24 hrs:  BP Temp Temp src Pulse Resp SpO2  04/04/19 0443 (!) 165/51 97.9 F (36.6 C) Oral (!) 57 - 100 %  04/03/19 1948 (!) 153/54 98 F (36.7 C) Oral 63 18 100 %  04/03/19 1324 (!) 120/41 98.5 F (36.9 C) Oral (!) 57 - 97 %     Physical Exam General: No apparent distress   HEENT: not dry Lungs: Normal effort. Lungs clear to auscultation, no crackles or wheezes. Cardiovascular: Regular rate and rhythm, no edema Abdomen: S/NT/ND; BS(+) Musculoskeletal:   unchanged with decreased range of motion right shoulder Neurological: No new neurological deficits with some upper extremity weakness Wounds: Right anterior neck surgical scar with Steri-Strips in place Skin: clear  Mental state: Alert, oriented, cooperative    Lab Results: BMET    Component Value Date/Time   NA 137 03/30/2019 0638   K 4.6 03/30/2019 0638   CL 104 03/30/2019 0638   CO2 23 03/30/2019 0638   GLUCOSE 253 (H) 03/30/2019 0638   BUN 28 (H) 03/30/2019 0638   CREATININE 1.11 (H) 03/30/2019 0638   CALCIUM 9.3 03/30/2019 0638   GFRNONAA 49 (L) 03/30/2019 0638   GFRAA 57 (L) 03/30/2019 0638   CBC    Component Value Date/Time   WBC 7.9 03/30/2019 0638   RBC 3.07 (L) 03/30/2019 0638   HGB 9.4 (L) 03/30/2019 0638   HCT 27.8 (L) 03/30/2019 0638   PLT 339 03/30/2019 0638   MCV 90.6 03/30/2019 0638   MCH 30.6 03/30/2019 0638   MCHC 33.8 03/30/2019 0638   RDW 13.5 03/30/2019 0638   LYMPHSABS 0.8 03/30/2019 0638   MONOABS 0.3 03/30/2019 0638   EOSABS 0.0 03/30/2019 0638   BASOSABS 0.0 03/30/2019 0638    Medications: I have reviewed the patient's current medications.  Assessment/Plan:  Functional deficits secondary to cervical myelopathy.  Continue CIR Status post decompressive anterior cervical discectomy 03/26/2019  Adhesive capsulitis right shoulder Diabetes mellitus.  Blood sugars consistently greater than 200.  Hopeful to improve with tapering discontinuation of Decadron.  Continue SSI DVT prophylaxis continue SCDs Hypertension.  Remains stable     Length of stay, days: 6  Marletta Lor , MD 04/04/2019, 10:56 AM

## 2019-04-05 ENCOUNTER — Inpatient Hospital Stay (HOSPITAL_COMMUNITY): Payer: Medicare Other

## 2019-04-05 ENCOUNTER — Inpatient Hospital Stay (HOSPITAL_COMMUNITY): Payer: Medicare Other | Admitting: Physical Therapy

## 2019-04-05 ENCOUNTER — Inpatient Hospital Stay (HOSPITAL_COMMUNITY): Payer: Medicare Other | Admitting: Occupational Therapy

## 2019-04-05 LAB — GLUCOSE, CAPILLARY
Glucose-Capillary: 222 mg/dL — ABNORMAL HIGH (ref 70–99)
Glucose-Capillary: 207 mg/dL — ABNORMAL HIGH (ref 70–99)
Glucose-Capillary: 108 mg/dL — ABNORMAL HIGH (ref 70–99)
Glucose-Capillary: 209 mg/dL — ABNORMAL HIGH (ref 70–99)

## 2019-04-05 MED ORDER — DEXAMETHASONE 4 MG PO TABS
4.0000 mg | ORAL_TABLET | Freq: Two times a day (BID) | ORAL | Status: DC
  Administered 2019-04-05 – 2019-04-09 (×8): 4 mg via ORAL
  Filled 2019-04-05 (×8): qty 1

## 2019-04-05 NOTE — Progress Notes (Signed)
Occupational Therapy Session Note  Patient Details  Name: Teresa Barnes MRN: [id number] Date of Birth: 1946-07-20  Today's Date: 04/05/2019 OT Individual Time: 0807-0900 OT Individual Time Calculation (min): 53 min    Short Term Goals: Week 1:  OT Short Term Goal 1 (Week 1): Pt will don shirt with min A OT Short Term Goal 2 (Week 1): Pt will use LRAD to don pants with min A OT Short Term Goal 3 (Week 1): Pt will transfer into walk in shower with min A OT Short Term Goal 4 (Week 1): Pt will increase R functional grasp to hold toothbrush  Skilled Therapeutic Interventions/Progress Updates:    Pt greeted sitting in wc and agreeable to OT treatment session. Pt reported need to go to the bathroom. Pt completed stand-pivot to toilet with use of grab bars and min A. Pt able to void bladder with improved grasp in R hand to assist with peri-care. Pt then needed mod A to stand from toilet, and ambulated to the shower with RW and min A.  Bathing completed with use of wash mit and assistance to wash buttocks and feet. Stand-pivot out of shower with min A. Worked on LB and UB dressing strategies with pt demonstrating improved R UE ROM and able to don shirt today with min A. Educated on LB dressing strategies with pt needing assistance to thread pant legs,but able to pull up over hips-pt may benefit from long handled AE training next session. Worked on standing balance/endurance while performing toothbrushing task. Pt did need assistance to screw lid on/off toothpaste. Pt left seated in wc at end of session with handoff to nursing for medication administration. Call bell in reach and needs met.   Therapy Documentation Precautions:  Precautions Precautions: Cervical, Fall Precaution Booklet Issued: Yes (comment) Precaution Comments: reviewed precautions Restrictions Weight Bearing Restrictions: No Pain:   denies pain  Therapy/Group: Individual Therapy  Valma Cava 04/05/2019, 9:16 AM

## 2019-04-05 NOTE — Progress Notes (Signed)
Physical Therapy Session Note  Patient Details  Name: Teresa Barnes MRN: [id number] Date of Birth: December 11, 1945  Today's Date: 04/05/2019 PT Individual Time: 1100-1155 PT Individual Time Calculation (min): 55 min   Short Term Goals: Week 1:  PT Short Term Goal 1 (Week 1): Pt will consistently complete bed mobility with min assist without hospital bed features.  PT Short Term Goal 2 (Week 1): Pt will consistently complete transfers with min assist & LRAD. PT Short Term Goal 3 (Week 1): Pt will ambulate 50 ft with LRAD & min assist.  PT Short Term Goal 4 (Week 1): Pt will negotiate 4 steps with R rail to simulate home entry with mod assist.   Skilled Therapeutic Interventions/Progress Updates:   Pt in w/c and agreeable to therapy, denies pain. Total assist w/c transport to/from therapy gym. Pt reports feeling most concerned about negotiating stairs at home. Practiced negotiating 6" steps w/ B rails for technique x4 steps, however only has R rail at home. CGA-min assist w/ B rails, min assist via HHA x1 to negotiate w/ R rail only x2 steps. Verbal and visual cues for technique w/ stair negotiation. Pt very fearful w/ R rail only, even with therapist providing HHA w/ other UE. Discussed technique using BUEs on R rail, negotiated 2 steps w/ this technique and CGA-min assist. Pt much more confident via this technique. Ambulated 25' w/ RW to/from NuStep, min assist and verbal cues for gait pattern, pt unable to correct B flexed knee gait. NuStep 5 min x2 @ level 4 w/ all extremities to work on global endurance, strengthening, and reciprocal movement pattern. Returned to room via w/c, stand pivot transfer to EOB w/ CGA. Ended session in supine, all needs in reach.   Therapy Documentation Precautions:  Precautions Precautions: Cervical, Fall Precaution Booklet Issued: Yes (comment) Precaution Comments: reviewed precautions Restrictions Weight Bearing Restrictions: No Vital Signs: Therapy  Vitals Pulse Rate: (!) 56 BP: (!) 154/51  Therapy/Group: Individual Therapy  Jameya Pontiff Clent Demark 04/05/2019, 12:45 PM

## 2019-04-05 NOTE — Progress Notes (Signed)
Physical Therapy Session Note  Patient Details  Name: Teresa Barnes MRN: [id number] Date of Birth: 01-14-1946  Today's Date: 04/05/2019 PT Individual Time: [phone] PT Individual Time Calculation (min): 29 min   Short Term Goals: Week 2:  PT Short Term Goal 1 (Week 2): STG = LTG due to estimated d/c date.   Skilled Therapeutic Interventions/Progress Updates:    Pt seated in w/c upon PT arrival, agreeable to therapy tx and denies pain. Pt requesting to stay in the room for therapy this session. Therapist suggesting working on bed mobility, pt reports she would like to stay in the w/c. Pt performed sit<>stand with RW and min assist, in standing worked on LE strength to perform x 10 marches in place and then x 10 mini squats. Pt seated in w/c therapist performed R shoulder flexion and abduction PROM for flexibility. Pt ambulated forwards/backwards 2 x 5 ft with RW and min assist, cues for step length and foot clearance with backwards steps. Pt ambulated x 15 ft within the room using RW and min assist, decreased gait speed and decreased step length noted. Pt left in w/c at end of session with needs in reach and chair alarm set.   Therapy Documentation Precautions:  Precautions Precautions: Cervical, Fall Precaution Booklet Issued: Yes (comment) Precaution Comments: reviewed precautions Restrictions Weight Bearing Restrictions: No    Therapy/Group: Individual Therapy  Netta Corrigan, PT, DPT 04/05/2019, 12:15 PM

## 2019-04-05 NOTE — Progress Notes (Addendum)
[facility] PHYSICAL MEDICINE & REHABILITATION PROGRESS NOTE   Subjective/Complaints: Had a pretty good weekend. Bowels moving. Right shoulder feeling better  ROS: Patient denies fever, rash, sore throat, blurred vision, nausea, vomiting, diarrhea, cough, shortness of breath or chest pain, joint or back pain, headache, or mood change.   Objective:   No results found. No results for input(s): WBC, HGB, HCT, PLT in the last 72 hours. No results for input(s): NA, K, CL, CO2, GLUCOSE, BUN, CREATININE, CALCIUM in the last 72 hours.  Intake/Output Summary (Last 24 hours) at 04/05/2019 0947 Last data filed at 04/05/2019 0840 Gross per 24 hour  Intake 297 ml  Output -  Net 297 ml     Physical Exam: Vital Signs Blood pressure (!) 154/51, pulse (!) 56, temperature 98 F (36.7 C), resp. rate 16, height 5\' 4"  (1.626 m), weight 80.7 kg, SpO2 98 %. Constitutional: No distress . Vital signs reviewed. HEENT: EOMI, oral membranes moist Neck: supple Cardiovascular: RRR without murmur. No JVD    Respiratory: CTA Bilaterally without wheezes or rales. Normal effort    GI: BS +, non-tender, non-distended  Musculoskeletal:neck tender, sitting agst kpad  General: tr to 1+ edema RUE.  Comments: pt able to actively abduct to almost 80 degrees. decreased tenderness.  Neurological: She is alertand oriented to person, place, and time. Nocranial nerve deficit. Normal insight and awareness, RUE 3- to 3/5 prox to 4/5 distally. LUE 3+/5 prox to 3+ to 4+/5 distally. LE 4 to 4+/5 prox to distal. Decreased sensation R>L to LT/pain continues. DTR's 1+ UE and 2+ LE.   Skin: Skin iswarm. Noerythema.  Marland Kitchen Psychiatric: pleasant and cooperative  Assessment/Plan: 1. Functional deficits secondary to cervical myelopathy which require 3+ hours per day of interdisciplinary therapy in a comprehensive inpatient rehab setting.  Physiatrist is providing close team supervision and 24 hour management of active  medical problems listed below.  Physiatrist and rehab team continue to assess barriers to discharge/monitor patient progress toward functional and medical goals  Care Tool:  Bathing  Bathing activity did not occur: Safety/medical concerns     Body parts bathed by helper: Front perineal area, Buttocks     Bathing assist Assist Level: Total Assistance - Patient < 25%     Upper Body Dressing/Undressing Upper body dressing   What is the patient wearing?: Hospital gown only    Upper body assist Assist Level: Maximal Assistance - Patient 25 - 49%    Lower Body Dressing/Undressing Lower body dressing      What is the patient wearing?: Underwear/pull up, Hospital gown only     Lower body assist Assist for lower body dressing: Moderate Assistance - Patient 50 - 74%     Toileting Toileting    Toileting assist Assist for toileting: Moderate Assistance - Patient 50 - 74%     Transfers Chair/bed transfer  Transfers assist  Chair/bed transfer activity did not occur: Safety/medical concerns  Chair/bed transfer assist level: Minimal Assistance - Patient > 75%     Locomotion Ambulation   Ambulation assist      Assist level: Minimal Assistance - Patient > 75% Assistive device: Walker-rolling Max distance: 82ft   Walk 10 feet activity   Assist     Assist level: Minimal Assistance - Patient > 75% Assistive device: Walker-rolling   Walk 50 feet activity   Assist Walk 50 feet with 2 turns activity did not occur: Safety/medical concerns         Walk 150 feet activity  Assist Walk 150 feet activity did not occur: Safety/medical concerns         Walk 10 feet on uneven surface  activity   Assist Walk 10 feet on uneven surfaces activity did not occur: Safety/medical concerns         Wheelchair     Assist Will patient use wheelchair at discharge?: No Type of Wheelchair: Manual    Wheelchair assist level: Maximal Assistance - Patient 25 -  49% Max wheelchair distance: 5 ft    Wheelchair 50 feet with 2 turns activity    Assist    Wheelchair 50 feet with 2 turns activity did not occur: Safety/medical concerns       Wheelchair 150 feet activity     Assist Wheelchair 150 feet activity did not occur: Safety/medical concerns        Medical Problem List and Plan: 1.Decreased functional mobilitysecondary to cervical stenosis with myelopathy status with weakness in a central cord pattern. She is status post decompressive anterior cervical discectomy 03/26/2019.No cervical collar needed -Patient is beginning CIR therapies today including PT and OT   -adhesive capsulitis right shoulder---improved with ROM and injection   -provided education re: maintenance to pt today 2. Antithrombotics: -DVT/anticoagulation:SCDs. dopplers performed and ?negative -antiplatelet therapy: N/A 3. Pain Management:Oxycodone and Robaxin as needed  -kpad for neck  -right shoulder injection 5/15 4. Mood:needs a lot of + reinforcement -antipsychotic agents: N/A 5. Neuropsych: This patientiscapable of making decisions on hisown behalf. 6. Skin/Wound Care:Routine skin checks 7. Fluids/Electrolytes/Nutrition: encourage PO  Continue to push po  -protein for low albumin 8.Diabetes mellitus with peripheral neuropathy. Hemoglobin A1c 6.6. SSI, Glucophage 1000 mg twice daily, Glucotrol XL 5 mg daily if blood sugar greater than 150.    -decrease decadron to 4mg  q 12 hours today  -sugars elevated also from recent steroid injection 9. Hypertension. Lisinopril 5 mg daily 10. Constipation/neurogenic bowel. -pt states increased senokot-s is working yet no bm documented since Saturday. Appeared to be ready to have one this morning---observe  -want AM program  -SSE if needed  LOS: 7 days A FACE TO [place] 04/05/2019, 9:47 AM

## 2019-04-05 NOTE — Progress Notes (Signed)
Physical Therapy Session Note  Patient Details  Name: Teresa Barnes MRN: [id number] Date of Birth: Mar 25, 1946  Today's Date: 04/05/2019 PT Individual Time: 1430-1530 PT Individual Time Calculation (min): 60 min   Short Term Goals: Week 1:  PT Short Term Goal 1 (Week 1): Pt will consistently complete bed mobility with min assist without hospital bed features.  PT Short Term Goal 2 (Week 1): Pt will consistently complete transfers with min assist & LRAD. PT Short Term Goal 3 (Week 1): Pt will ambulate 50 ft with LRAD & min assist.  PT Short Term Goal 4 (Week 1): Pt will negotiate 4 steps with R rail to simulate home entry with mod assist.   Skilled Therapeutic Interventions/Progress Updates:  Pt received in bed & agreeable to tx. Pt transfers supine>sitting EOB with supervision, HOB elevated, and bed rails. Therapist provides cuing during session for cervical precautions & min cuing for proper hand placement for sit<>stand transfers. Pt transfers sit<>stand with CGA and requires ongoing assistance and cuing for managing RUE on hand orthosis. In dayroom pt ambulates 60 ft x 2 with RW & min assist with cuing for increased step width LLE with pt demonstrating L toe in (minimal ability to correct despite cuing), decreased weight shifting L<>R, decreased step & stride length, decreased dorsiflexion during swing phase and decreased heel strike. Pt completes car transfer at [employer] simulated height (educated pt it's best to use this vehicle vs van or SUV she'd have to use running board to step into with pt in agreement). Pt requires min assist to ambulate to/from car with RW, instructional cuing to sit then place BLE in/out and extra time to do so, and cuing for hand placement on stable surface for sit<>Stand. Back in room pt ambulates in room/bathroom with RW & min assist with cuing for safety when negotiating bathroom threshold. Pt performs toilet transfer with min assist, managing clothing with assistance  only to pull brief, pants over hips when finished and pt with continent void, performing peri hygiene in standing with CGA assist. At end of session pt left sitting in w/c with chair alarm donned & all needs at hand.  Therapist provided encouragement throughout session as pt reports fatigue and inability to participate in next therapy session.   Therapy Documentation Precautions:  Precautions Precautions: Cervical, Fall Precaution Booklet Issued: Yes (comment) Precaution Comments: reviewed precautions Restrictions Weight Bearing Restrictions: No   Pain: Pt reports "It's okay" in regards to her pain level at beginning of session, no further c/o during session.    Therapy/Group: Individual Therapy  [name] 04/05/2019, 3:37 PM

## 2019-04-05 NOTE — Progress Notes (Addendum)
Physical Therapy Weekly Progress Note  Patient Details  Name: Teresa Barnes MRN: [id number] Date of Birth: August 29, 1946  Beginning of progress report period: Mar 27, 2019 End of progress report period: Apr 05, 2019  Today's Date: 04/05/2019  Patient has met 2 of 4 short term goals.  Pt is making fair progress towards all LTG's. Pt currently can ambulate household distances with RW & R hand orthosis with min assist with impaired gait pattern. Pt can negotiate 2 steps with 1 rail with min assist to simulate home environment. Pt continues to demonstrate with impaired activity tolerance and impaired neuromuscular control of BUE/BLE. Pt would benefit from continued skilled PT treatment to focus on transfers, gait, bed mobility, endurance, stair negotiation, and for caregiver training prior to d/c.   Patient continues to demonstrate the following deficits muscle weakness, decreased cardiorespiratoy endurance, decreased coordination, decreased safety awareness and decreased memory, and decreased standing balance, decreased postural control, decreased balance strategies and difficulty maintaining precautions and therefore will continue to benefit from skilled PT intervention to increase functional independence with mobility.  Patient is making far progress towards all LTG's. .  Plan of care revisions: bed mobility downgraded to min assist, gait distance downgraded 2/2 slow progress.  PT Short Term Goals Week 1:  PT Short Term Goal 1 (Week 1): Pt will consistently complete bed mobility with min assist without hospital bed features.  PT Short Term Goal 1 - Progress (Week 1): Not met PT Short Term Goal 2 (Week 1): Pt will consistently complete transfers with min assist & LRAD. PT Short Term Goal 2 - Progress (Week 1): Met PT Short Term Goal 3 (Week 1): Pt will ambulate 50 ft with LRAD & min assist.  PT Short Term Goal 3 - Progress (Week 1): Met PT Short Term Goal 4 (Week 1): Pt will negotiate 4 steps with R  rail to simulate home entry with mod assist.  PT Short Term Goal 4 - Progress (Week 1): Progressing toward goal Week 2:  PT Short Term Goal 1 (Week 2): STG = LTG due to estimated d/c date.     Therapy Documentation Precautions:  Precautions Precautions: Cervical, Fall Precaution Booklet Issued: Yes (comment) Precaution Comments: reviewed precautions Restrictions Weight Bearing Restrictions: No     Therapy/Group: Individual Therapy  [name] 04/05/2019, 3:50 PM

## 2019-04-06 ENCOUNTER — Inpatient Hospital Stay (HOSPITAL_COMMUNITY): Payer: Medicare Other

## 2019-04-06 ENCOUNTER — Inpatient Hospital Stay (HOSPITAL_COMMUNITY): Payer: Medicare Other | Admitting: Physical Therapy

## 2019-04-06 ENCOUNTER — Inpatient Hospital Stay (HOSPITAL_COMMUNITY): Payer: Medicare Other | Admitting: Occupational Therapy

## 2019-04-06 LAB — GLUCOSE, CAPILLARY
Glucose-Capillary: 267 mg/dL — ABNORMAL HIGH (ref 70–99)
Glucose-Capillary: 137 mg/dL — ABNORMAL HIGH (ref 70–99)
Glucose-Capillary: 171 mg/dL — ABNORMAL HIGH (ref 70–99)

## 2019-04-06 MED ORDER — POLYETHYLENE GLYCOL 3350 17 G PO PACK
17.0000 g | PACK | Freq: Every day | ORAL | Status: DC
  Administered 2019-04-06: 17 g via ORAL
  Filled 2019-04-06 (×8): qty 1

## 2019-04-06 NOTE — Progress Notes (Signed)
[place] PHYSICAL MEDICINE & REHABILITATION PROGRESS NOTE   Subjective/Complaints: No new issues this morning. Happy with progress  ROS: Patient denies fever, rash, sore throat, blurred vision, nausea, vomiting, diarrhea, cough, shortness of breath or chest pain, joint or back pain, headache, or mood change.    Objective:   No results found. No results for input(s): WBC, HGB, HCT, PLT in the last 72 hours. No results for input(s): NA, K, CL, CO2, GLUCOSE, BUN, CREATININE, CALCIUM in the last 72 hours.  Intake/Output Summary (Last 24 hours) at 04/06/2019 1035 Last data filed at 04/06/2019 0823 Gross per 24 hour  Intake 757 ml  Output -  Net 757 ml     Physical Exam: Vital Signs Blood pressure (!) 146/56, pulse (!) 58, temperature 97.6 F (36.4 C), temperature source Oral, resp. rate 18, height 5\' 4"  (1.626 m), weight 80.7 kg, SpO2 100 %. Constitutional: No distress . Vital signs reviewed. HEENT: EOMI, oral membranes moist Neck: supple Cardiovascular: RRR without murmur. No JVD    Respiratory: CTA Bilaterally without wheezes or rales. Normal effort    GI: BS +, non-tender, non-distended  Musculoskeletal:neck stable in appearance  General: tr  edema RUE.  Comments: pt able to actively abduct to almost 80+ degrees. decreased tenderness.  Neurological: She is alertand oriented to person, place, and time. Nocranial nerve deficit. Normal insight and awareness, RUE 3- to 3/5 prox to 4/5 distally. LUE 3+/5 prox to 3+ to 4+/5 distally. LE 4 to 4+/5 prox to distal. Decreased sensation R>L to LT/pain continues. DTR's 1+ UE and 2+ LE.   Skin: Skin iswarm. Noerythema.  Marland Kitchen Psychiatric:pleasant and cooperative  Assessment/Plan: 1. Functional deficits secondary to cervical myelopathy which require 3+ hours per day of interdisciplinary therapy in a comprehensive inpatient rehab setting.  Physiatrist is providing close team supervision and 24 hour management of active  medical problems listed below.  Physiatrist and rehab team continue to assess barriers to discharge/monitor patient progress toward functional and medical goals  Care Tool:  Bathing  Bathing activity did not occur: Safety/medical concerns Body parts bathed by patient: Right arm, Left arm, Chest, Abdomen, Front perineal area, Right upper leg, Left upper leg, Face   Body parts bathed by helper: Right lower leg, Left lower leg, Buttocks     Bathing assist Assist Level: Contact Guard/Touching assist     Upper Body Dressing/Undressing Upper body dressing   What is the patient wearing?: Pull over shirt    Upper body assist Assist Level: Minimal Assistance - Patient > 75%    Lower Body Dressing/Undressing Lower body dressing      What is the patient wearing?: Underwear/pull up, Pants     Lower body assist Assist for lower body dressing: Moderate Assistance - Patient 50 - 74%     Toileting Toileting    Toileting assist Assist for toileting: Moderate Assistance - Patient 50 - 74%     Transfers Chair/bed transfer  Transfers assist  Chair/bed transfer activity did not occur: Safety/medical concerns  Chair/bed transfer assist level: Contact Guard/Touching assist     Locomotion Ambulation   Ambulation assist      Assist level: Minimal Assistance - Patient > 75% Assistive device: Walker-rolling Max distance: 5 ft   Walk 10 feet activity   Assist     Assist level: Minimal Assistance - Patient > 75% Assistive device: Walker-rolling   Walk 50 feet activity   Assist Walk 50 feet with 2 turns activity did not occur: Safety/medical concerns  Assist  level: Minimal Assistance - Patient > 75% Assistive device: Walker-rolling    Walk 150 feet activity   Assist Walk 150 feet activity did not occur: Safety/medical concerns         Walk 10 feet on uneven surface  activity   Assist Walk 10 feet on uneven surfaces activity did not occur: Safety/medical  concerns         Wheelchair     Assist Will patient use wheelchair at discharge?: No Type of Wheelchair: Manual    Wheelchair assist level: Maximal Assistance - Patient 25 - 49% Max wheelchair distance: 5 ft    Wheelchair 50 feet with 2 turns activity    Assist    Wheelchair 50 feet with 2 turns activity did not occur: Safety/medical concerns       Wheelchair 150 feet activity     Assist Wheelchair 150 feet activity did not occur: Safety/medical concerns        Medical Problem List and Plan: 1.Decreased functional mobilitysecondary to cervical stenosis with myelopathy status with weakness in a central cord pattern. She is status post decompressive anterior cervical discectomy 03/26/2019.No cervical collar needed -team conf today  -adhesive capsulitis right shoulder---improved with ROM and injection     2. Antithrombotics: -DVT/anticoagulation:SCDs. dopplers performed and ?negative -antiplatelet therapy: N/A 3. Pain Management:Oxycodone and Robaxin as needed  -kpad for neck  -right shoulder injection 5/15 with improvement 4. Mood:needs a lot of + reinforcement -antipsychotic agents: N/A 5. Neuropsych: This patientiscapable of making decisions on hisown behalf. 6. Skin/Wound Care:Routine skin checks 7. Fluids/Electrolytes/Nutrition: encourage PO  Continue to push po  -protein for low albumin 8.Diabetes mellitus with peripheral neuropathy. Hemoglobin A1c 6.6. SSI, Glucophage 1000 mg twice daily, Glucotrol XL 5 mg daily if blood sugar greater than 150.    -decrease decadron to 4mg  q 12 hours 5/18  -sugars falling with decadron taper and as we are farther out from injection 9. Hypertension. Lisinopril 5 mg daily 10. Constipation/neurogenic bowel. -pt states increased senokot-s is working still no bm documented since Saturday.    -add daily miralax  -want AM program  -SSE if  needed  -check with RN today  LOS: 8 days A FACE TO FACE EVALUATION WAS [place] 04/06/2019, 10:35 AM

## 2019-04-06 NOTE — Progress Notes (Signed)
Occupational Therapy Weekly Progress Note  Patient Details  Name: Teresa Barnes MRN: [id number] Date of Birth: Apr 21, 1946  Beginning of progress report period: Mar 30, 2019 End of progress report period: Apr 06, 2019  Today's Date: 04/06/2019 OT Individual Time: 8127-5170 OT Individual Time Calculation (min): 60 min    Patient has met 3 of 4 short term goals.  Pt continues to make good progress toward LTG of supervision and has a supportive husband at home. Pt still requires extra time to complete all tasks and continues to use bilateral hands for all tasks without needed cuing. Pt can ambulate around the room and perform transfers min guard with extra time with RW with hand orthosis. Pt overall min A for UB bathing and dressing and mod A for LB and max A for TEDS and shoes. Pt does require A for hygiene after bowel movement.   Patient continues to demonstrate the following deficits: muscle weakness, decreased cardiorespiratoy endurance, impaired timing and sequencing, unbalanced muscle activation and decreased coordination and decreased standing balance and decreased balance strategies and therefore will continue to benefit from skilled OT intervention to enhance overall performance with BADL and Reduce care partner burden.  Patient progressing toward long term goals..  Continue plan of care.  OT Short Term Goals Week 1:  OT Short Term Goal 1 (Week 1): Pt will don shirt with min A OT Short Term Goal 1 - Progress (Week 1): Met OT Short Term Goal 2 (Week 1): Pt will use LRAD to don pants with min A OT Short Term Goal 2 - Progress (Week 1): Progressing toward goal OT Short Term Goal 3 (Week 1): Pt will transfer into walk in shower with min A OT Short Term Goal 3 - Progress (Week 1): Met OT Short Term Goal 4 (Week 1): Pt will increase R functional grasp to hold toothbrush OT Short Term Goal 4 - Progress (Week 1): Met Week 2:  OT Short Term Goal 1 (Week 2): LTG=STG OT Short Term Goal 2 (Week  2): Pt will use right hand at nondomiant level mod I  Skilled Therapeutic Interventions/Progress Updates:    1:1 pt just finished breakfast when arrived. Focus on self care retraining including showering, ambulating with RW with hand splint around the room, sit to stands, standing balance without UE support, functional use of bilateral hands and dressing. Pt ambulated to and from the bathroom with RW with hand orthosis with min A with cues for walker management and larger step length. Pt still requires A to pull down pants for toileting but able to doff them once sitting. Pt reports there will be a grab bar in the shower when she arrives home. Practiced stepping over the threshold into the shower (simulated with towel roll) with grab bar and RW. Pt continues to use hand mit on right hand to wash 60% of self. Pt able to perform sit to stands in the shower with supervision with grab bar. I does require A to bath lower legs and buttocks. PT able to dress UB with min A- requiring A to put shirt over her head. Practiced modified way to pull up pants with success. Pt does require extra time for all tasks. PT brushed teeth with setup. Min guard transfer to recliner to rest. [name] don shoes to try walking in them today instead of socks  Therapy Documentation Precautions:  Precautions Precautions: Cervical, Fall Precaution Booklet Issued: Yes (comment) Precaution Comments: reviewed precautions Restrictions Weight Bearing Restrictions: No Pain:  no  c/o pain in session    Therapy/Group: Individual Therapy  Teresa Barnes Teresa Barnes 04/06/2019, 9:29 AM

## 2019-04-06 NOTE — Plan of Care (Signed)
  Problem: Consults Goal: RH SPINAL CORD INJURY PATIENT EDUCATION Description  See Patient Education module for education specifics.  Outcome: Progressing   Problem: SCI BOWEL ELIMINATION Goal: RH STG MANAGE BOWEL WITH ASSISTANCE Description STG Manage Bowel with Min Assistance.  Outcome: Progressing Goal: RH STG SCI MANAGE BOWEL WITH MEDICATION WITH ASSISTANCE Description STG SCI Manage bowel with medication with min assistance.  Outcome: Progressing   Problem: SCI BLADDER ELIMINATION Goal: RH STG MANAGE BLADDER WITH ASSISTANCE Description STG Manage Bladder With Min Assistance  Outcome: Progressing   Problem: RH SKIN INTEGRITY Goal: RH STG SKIN FREE OF INFECTION/BREAKDOWN Description No new breakdown with min assist   Outcome: Progressing Goal: RH STG ABLE TO PERFORM INCISION/WOUND CARE W/ASSISTANCE Description STG Able To Perform Incision/Wound Care With Mod I Assistance.  Outcome: Progressing   Problem: RH PAIN MANAGEMENT Goal: RH STG PAIN MANAGED AT OR BELOW PT'S PAIN GOAL Description < 2 out of 10.   Outcome: Progressing

## 2019-04-06 NOTE — Progress Notes (Signed)
Nutrition Brief Note  Patient identified on the Malnutrition Screening Tool (MST) Report.  Wt Readings from Last 15 Encounters:  03/29/19 80.7 kg  03/29/19 83.8 kg  03/24/19 81.6 kg  01/21/19 83.5 kg  12/28/18 82.4 kg  10/13/18 81.6 kg  09/24/18 80.2 kg  09/10/18 82.1 kg  09/09/18 82.1 kg  08/05/18 83 kg  07/15/18 83.5 kg  06/22/18 82.5 kg  05/05/18 83 kg  03/09/18 83.3 kg  01/06/18 82.2 kg    Body mass index is 30.54 kg/m. Patient meets criteria for obesity unspecified based on current BMI.   Current diet order is Carb Modified, patient is consuming approximately 80% of meals at this time. Labs and medications reviewed.  No nutrition interventions warranted at this time. If nutrition issues arise, please consult RD.    Gaynell Face, MS, RD, LDN Inpatient Clinical Dietitian Pager: [phone] Weekend/After Hours: [phone]

## 2019-04-06 NOTE — Progress Notes (Signed)
Physical Therapy Session Note  Patient Details  Name: Teresa Barnes MRN: [id number] Date of Birth: Sep 09, 1946  Today's Date: 04/06/2019 PT Individual Time: 1594-7076 PT Individual Time Calculation (min): 25 min   Short Term Goals: Week 2:  PT Short Term Goal 1 (Week 2): STG = LTG due to estimated d/c date.   Skilled Therapeutic Interventions/Progress Updates:  Pt received in recliner & agreeable to tx, but reporting fatigue. Pt transfers sit<>stand with min assist, continues to require assistance with managing RUE on hand orthosis and pt ambulates in room<>bathroom with RW, R hand orthosis & CGA. Pt manages clothing to pull them below hips while standing with CGA<>close supervision and performs toilet transfer with CGA, but requires max assist to pull brief & pants over hips when finished. Pt performs peri hygiene standing with CGA and hand hygiene standing at sink with close supervision. Pt declines going to gym to attempt stair negotiation despite max education & encouragement but pt agreeable to ambulating. Pt ambulates hallway & back x 45 ft with RW & CGA with ongoing internal rotation LLE, decreased step length & stride length, decreased weight shifting, & decreased gait speed. Pt returned to sitting in recliner in room & left with BLE elevated, chair alarm donned & all needs in reach, pt on phone in heated conversation with daughter.   Pt perseverating on family issues & fatigue despite therapist attempting to redirect & encourage pt.   Educated pt on estimated d/c date as established in conference.  Therapy Documentation Precautions:  Precautions Precautions: Cervical, Fall Precaution Booklet Issued: Yes (comment) Precaution Comments: reviewed precautions Restrictions Weight Bearing Restrictions: No  Pain: No c/o pain reported.     Therapy/Group: Individual Therapy  [name] 04/06/2019, 3:25 PM

## 2019-04-06 NOTE — Progress Notes (Signed)
Physical Therapy Session Note  Patient Details  Name: Teresa Barnes MRN: [id number] Date of Birth: [birth year]  Today's Date: 04/06/2019 PT Individual Time: 0919-8022 PT Individual Time Calculation (min): 30 min   Short Term Goals: Week 2:  PT Short Term Goal 1 (Week 2): STG = LTG due to estimated d/c date.   Skilled Therapeutic Interventions/Progress Updates:    CGA to min assist for sit <> stand from recliner with verbal cues for hand placement and facilitation for anterior weighshift. CGA with gait with RW in and out of bathroom with min assist for threhold navigation. Min assist for functional dynamic standing balance for clothing management. NMR for blocked practice sit <> stands with decreasing use of UE's x 6 reps; no UE support for eccentric control from standing to sit with improved control noted with repetitions. Min to mod assist for facilitation of weightshift and to maintain balance during forward and backwards stepping without AD x 5' each direction. Cues for upright posture and hip/trunk extension.   Therapy Documentation Precautions:  Precautions Precautions: Cervical, Fall Precaution Booklet Issued: Yes (comment) Precaution Comments: reviewed precautions Restrictions Weight Bearing Restrictions: No   Pain: No complaints of pain.    Therapy/Group: Individual Therapy  Teresa Barnes, PT, DPT, CBIS  04/06/2019, 1:38 PM

## 2019-04-06 NOTE — Plan of Care (Signed)
  Problem: RH SKIN INTEGRITY Goal: RH STG SKIN FREE OF INFECTION/BREAKDOWN Description No new breakdown with min assist   Outcome: Progressing   Problem: RH PAIN MANAGEMENT Goal: RH STG PAIN MANAGED AT OR BELOW PT'S PAIN GOAL Description < 2 out of 10.   Outcome: Progressing   Problem: RH SKIN INTEGRITY Goal: RH STG ABLE TO PERFORM INCISION/WOUND CARE W/ASSISTANCE Description STG Able To Perform Incision/Wound Care With Mod I Assistance.  Outcome: Progressing   Problem: SCI BOWEL ELIMINATION Goal: RH STG MANAGE BOWEL WITH ASSISTANCE Description STG Manage Bowel with Min Assistance.  Outcome: Progressing   Problem: RH PAIN MANAGEMENT Goal: RH STG PAIN MANAGED AT OR BELOW PT'S PAIN GOAL Description < 2 out of 10.   Outcome: Progressing

## 2019-04-06 NOTE — Progress Notes (Signed)
Physical Therapy Session Note  Patient Details  Name: Teresa Barnes MRN: [id number] Date of Birth: 1946-01-21  Today's Date: 04/06/2019 PT Individual Time: 1105-1200 PT Individual Time Calculation (min): 55 min   Short Term Goals: Week 2:  PT Short Term Goal 1 (Week 2): STG = LTG due to estimated d/c date.   Skilled Therapeutic Interventions/Progress Updates:   Pt in recliner and agreeable to therapy, no c/o pain. Ambulated to therapy gym w/ RW and CGA in 4 bouts of 30-40' at a time. Pt limited in distance by fatigue and continues to ambulate w/ slow gait speed. Frequent verbal reminders for gait pattern including to increased step length, gait speed, and fluidity of gait. NuStep 5 min x2 @ level 4 for global strengthening, endurance, and reciprocal movement pattern. Returned to room total assist in w/c, ended session in recliner, all needs in reach.   Therapy Documentation Precautions:  Precautions Precautions: Cervical, Fall Precaution Booklet Issued: Yes (comment) Precaution Comments: reviewed precautions Restrictions Weight Bearing Restrictions: No Pain: Pain Assessment Faces Pain Scale: No hurt  Therapy/Group: Individual Therapy  Marquavis Hannen Clent Demark 04/06/2019, 12:34 PM

## 2019-04-07 ENCOUNTER — Inpatient Hospital Stay (HOSPITAL_COMMUNITY): Payer: Medicare Other

## 2019-04-07 ENCOUNTER — Inpatient Hospital Stay (HOSPITAL_COMMUNITY): Payer: Medicare Other | Admitting: Physical Therapy

## 2019-04-07 ENCOUNTER — Inpatient Hospital Stay (HOSPITAL_COMMUNITY): Payer: Medicare Other | Admitting: Occupational Therapy

## 2019-04-07 LAB — GLUCOSE, CAPILLARY
Glucose-Capillary: 229 mg/dL — ABNORMAL HIGH (ref 70–99)
Glucose-Capillary: 251 mg/dL — ABNORMAL HIGH (ref 70–99)
Glucose-Capillary: 164 mg/dL — ABNORMAL HIGH (ref 70–99)
Glucose-Capillary: 214 mg/dL — ABNORMAL HIGH (ref 70–99)

## 2019-04-07 MED ORDER — SORBITOL 70 % SOLN
60.0000 mL | Status: AC
  Administered 2019-04-07: 60 mL via ORAL

## 2019-04-07 NOTE — Progress Notes (Signed)
Occupational Therapy Session Note  Patient Details  Name: Teresa Barnes MRN: [id number] Date of Birth: 1946/01/28  Today's Date: 04/07/2019 OT Individual Time: 0233-4356 OT Individual Time Calculation (min): 14 min  and Today's Date: 04/07/2019 OT Missed Time: 30 Minutes Missed Time Reason: Patient fatigue;Pain   Short Term Goals: Week 2:  OT Short Term Goal 1 (Week 2): LTG=STG OT Short Term Goal 2 (Week 2): Pt will use right hand at nondomiant level mod I  Skilled Therapeutic Interventions/Progress Updates:    Upon entering the room, pt sleeping soundly. Pt reporting increased pain in back and having just received medication to move bowels. Pt declined toileting and all other offered OT  Intervention this session. Pt did allow therapist to reposition pt on L side for comfort. Bed alarm activated and call bell within reach.   Therapy Documentation Precautions:  Precautions Precautions: Cervical, Fall Precaution Booklet Issued: Yes (comment) Precaution Comments: reviewed precautions Restrictions Weight Bearing Restrictions: No General: General OT Amount of Missed Time: 30 Minutes Vital Signs: Therapy Vitals Temp: 97.8 F (36.6 C) Temp Source: Oral Pulse Rate: 70 Resp: 18 BP: (!) 114/43 Patient Position (if appropriate): Sitting Oxygen Therapy SpO2: 100 % O2 Device: Room Air Pain: Pain Assessment Pain Scale: 0-10 Pain Score: 5  Pain Type: Acute pain Pain Location: Back Pain Orientation: Lower Pain Descriptors / Indicators: Aching;Discomfort;Grimacing Pain Frequency: Intermittent Pain Onset: Gradual Pain Intervention(s): Medication (See eMAR);Repositioned   Therapy/Group: Individual Therapy  Gypsy Decant 04/07/2019, 3:44 PM

## 2019-04-07 NOTE — Progress Notes (Signed)
[place] PHYSICAL MEDICINE & REHABILITATION PROGRESS NOTE   Subjective/Complaints: Up in chair. No new issues. Bowels have been slow still  ROS: Patient denies fever, rash, sore throat, blurred vision, nausea, vomiting, diarrhea, cough, shortness of breath or chest pain,  headache, or mood change.   Objective:   No results found. No results for input(s): WBC, HGB, HCT, PLT in the last 72 hours. No results for input(s): NA, K, CL, CO2, GLUCOSE, BUN, CREATININE, CALCIUM in the last 72 hours.  Intake/Output Summary (Last 24 hours) at 04/07/2019 1251 Last data filed at 04/07/2019 0800 Gross per 24 hour  Intake 716 ml  Output -  Net 716 ml     Physical Exam: Vital Signs Blood pressure (!) 118/49, pulse 62, temperature 97.9 F (36.6 C), temperature source Oral, resp. rate 17, height 5\' 4"  (1.626 m), weight 80.7 kg, SpO2 98 %. Constitutional: No distress . Vital signs reviewed. HEENT: EOMI, oral membranes moist Neck: supple Cardiovascular: RRR without murmur. No JVD    Respiratory: CTA Bilaterally without wheezes or rales. Normal effort    GI: BS +, non-tender, sl-distended  Musculoskeletal:neck stable in appearance  General: tr  edema RUE.  Comments: pt able to actively abduct to almost 80+ degrees. decreased tenderness.  Neurological: She is alertand oriented to person, place, and time. Nocranial nerve deficit. Normal insight and awareness, RUE 3/5 prox to 4/5 distally. LUE 3+/5 prox to  4-4+/5 distally. LE 4 to 4+/5 prox to distal. Decreased sensation R>L to LT/pain continues. DTR's 1+ UE and 2+ LE.   Skin: Skin iswarm. Noerythema.  Marland Kitchen Psychiatric:pleasant and cooperative  Assessment/Plan: 1. Functional deficits secondary to cervical myelopathy which require 3+ hours per day of interdisciplinary therapy in a comprehensive inpatient rehab setting.  Physiatrist is providing close team supervision and 24 hour management of active medical problems listed  below.  Physiatrist and rehab team continue to assess barriers to discharge/monitor patient progress toward functional and medical goals  Care Tool:  Bathing  Bathing activity did not occur: Safety/medical concerns Body parts bathed by patient: Right arm, Left arm, Chest, Abdomen, Front perineal area, Right upper leg, Left upper leg, Face   Body parts bathed by helper: Right lower leg, Left lower leg, Buttocks     Bathing assist Assist Level: Contact Guard/Touching assist     Upper Body Dressing/Undressing Upper body dressing   What is the patient wearing?: Pull over shirt    Upper body assist Assist Level: Minimal Assistance - Patient > 75%    Lower Body Dressing/Undressing Lower body dressing      What is the patient wearing?: Underwear/pull up, Pants     Lower body assist Assist for lower body dressing: Moderate Assistance - Patient 50 - 74%     Toileting Toileting    Toileting assist Assist for toileting: Minimal Assistance - Patient > 75%     Transfers Chair/bed transfer  Transfers assist  Chair/bed transfer activity did not occur: Safety/medical concerns  Chair/bed transfer assist level: Contact Guard/Touching assist     Locomotion Ambulation   Ambulation assist      Assist level: Minimal Assistance - Patient > 75% Assistive device: Walker-rolling(R hand orthosis) Max distance: 45 ft   Walk 10 feet activity   Assist     Assist level: Minimal Assistance - Patient > 75% Assistive device: Walker-rolling(R hand orthosis)   Walk 50 feet activity   Assist Walk 50 feet with 2 turns activity did not occur: Safety/medical concerns  Assist level: Minimal  Assistance - Patient > 75% Assistive device: Walker-rolling    Walk 150 feet activity   Assist Walk 150 feet activity did not occur: Safety/medical concerns         Walk 10 feet on uneven surface  activity   Assist Walk 10 feet on uneven surfaces activity did not occur:  Safety/medical concerns         Wheelchair     Assist Will patient use wheelchair at discharge?: No Type of Wheelchair: Manual    Wheelchair assist level: Maximal Assistance - Patient 25 - 49% Max wheelchair distance: 5 ft    Wheelchair 50 feet with 2 turns activity    Assist    Wheelchair 50 feet with 2 turns activity did not occur: Safety/medical concerns       Wheelchair 150 feet activity     Assist Wheelchair 150 feet activity did not occur: Safety/medical concerns        Medical Problem List and Plan: 1.Decreased functional mobilitysecondary to cervical stenosis with myelopathy status with weakness in a central cord pattern. She is status post decompressive anterior cervical discectomy 03/26/2019.No cervical collar needed --Continue CIR therapies including PT, OT   -adhesive capsulitis right shoulder---improved with ROM and injection     2. Antithrombotics: -DVT/anticoagulation:SCDs. dopplers performed and negative -antiplatelet therapy: N/A 3. Pain Management:Oxycodone and Robaxin as needed  -kpad for neck  -right shoulder injection 5/15 with improvement 4. Mood:needs a lot of + reinforcement -antipsychotic agents: N/A 5. Neuropsych: This patientiscapable of making decisions on hisown behalf. 6. Skin/Wound Care:Routine skin checks 7. Fluids/Electrolytes/Nutrition: encourage PO  Continue to push po  -protein for low albumin 8.Diabetes mellitus with peripheral neuropathy. Hemoglobin A1c 6.6. SSI, Glucophage 1000 mg twice daily, Glucotrol XL 5 mg daily if blood sugar greater than 150.    -decrease decadron to 4mg  q 12 hours 5/18---wean to 2mg  q12 tomorrow  -sugars decreasing  -SSI 9. Hypertension. Lisinopril 5 mg daily 10. Constipation/neurogenic bowel. -senokot-s at HS    -add daily miralax  -want AM program  -sorbitol and SSE today     LOS: 9 days A FACE TO [place] 04/07/2019, 12:51 PM

## 2019-04-07 NOTE — Progress Notes (Signed)
Occupational Therapy Session Note  Patient Details  Name: Teresa Barnes MRN: [id number] Date of Birth: [birth year]  Today's Date: 04/07/2019 OT Individual Time: 2258-3462 OT Individual Time Calculation (min): 60 min    Short Term Goals: Week 2:  OT Short Term Goal 1 (Week 2): LTG=STG OT Short Term Goal 2 (Week 2): Pt will use right hand at nondomiant level mod I  Skilled Therapeutic Interventions/Progress Updates:    Pt received in w/c with no further c/o pain in neck d/t nursing providing medication. Pt donned jacket with min A. Pt was transported to therapy gym for time constraint. Pt completed 10 ft of functional mobility to therapy mat with RW with CGA. Moist heat pack applied to pt's R shoulder as a preparatory method for treatment. Pt completed card game, dealing out cards with more than reasonable time required for hand manipulation and pincer grasp. Pt transitioned to supine on mat and full PROM and AAROM completed to increase muscle elasticity. Pt completed closed chain functional reaching bimanually with HOH required at R UE to stabilize. Pt returned to w/c and was transported back to room. Pt left sitting up in w/c with chair alarm set, all needs met.   Therapy Documentation Precautions:  Precautions Precautions: Cervical, Fall Precaution Booklet Issued: Yes (comment) Precaution Comments: reviewed precautions Restrictions Weight Bearing Restrictions: No   Therapy/Group: Individual Therapy  Curtis Sites 04/07/2019, 7:22 AM

## 2019-04-08 ENCOUNTER — Inpatient Hospital Stay (HOSPITAL_COMMUNITY): Payer: Medicare Other | Admitting: Physical Therapy

## 2019-04-08 ENCOUNTER — Inpatient Hospital Stay (HOSPITAL_COMMUNITY): Payer: Medicare Other

## 2019-04-08 ENCOUNTER — Inpatient Hospital Stay (HOSPITAL_COMMUNITY): Payer: Medicare Other | Admitting: Occupational Therapy

## 2019-04-08 LAB — GLUCOSE, CAPILLARY
Glucose-Capillary: 274 mg/dL — ABNORMAL HIGH (ref 70–99)
Glucose-Capillary: 255 mg/dL — ABNORMAL HIGH (ref 70–99)
Glucose-Capillary: 112 mg/dL — ABNORMAL HIGH (ref 70–99)
Glucose-Capillary: 195 mg/dL — ABNORMAL HIGH (ref 70–99)
Glucose-Capillary: 134 mg/dL — ABNORMAL HIGH (ref 70–99)

## 2019-04-08 NOTE — Progress Notes (Signed)
Occupational Therapy Session Note  Patient Details  Name: Teresa Barnes MRN: [id number] Date of Birth: [birth year]  Today's Date: 04/08/2019 OT Individual Time: 3614-4315 OT Individual Time Calculation (min): 45 min    Short Term Goals: Week 2:  OT Short Term Goal 1 (Week 2): LTG=STG OT Short Term Goal 2 (Week 2): Pt will use right hand at nondomiant level mod I  Skilled Therapeutic Interventions/Progress Updates:    Patient completing toileting upon arrival and requests to get into the shower.  She is pleasant and cooperative and aware of needs t/o session.  SPT to/from toilet, shower seat, bed, w/c with CG A and use of either rails or RW for support.  Bathing in shower with assist for buttocks and bilateral lower legs (increased time for thoroughness due to UE motor deficits)  UB dressing set up/supervision, LB dressing mod A seated in w/c, grooming set up/supervision w/c level at sink.  Patient returned to bed at close of session with bed alarm set and call bell in reach.    Therapy Documentation Precautions:  Precautions Precautions: Cervical, Fall Precaution Booklet Issued: Yes (comment) Precaution Comments: reviewed precautions Restrictions Weight Bearing Restrictions: No General:   Vital Signs: Therapy Vitals Temp: 97.9 F (36.6 C) Temp Source: Oral Pulse Rate: (!) 55 Resp: 16 BP: (!) 111/44 Patient Position (if appropriate): Lying Oxygen Therapy SpO2: 100 % O2 Device: Room Air Pain: Pain Assessment Pain Scale: 0-10 Pain Score: 0-No pain   Other Treatments:     Therapy/Group: Individual Therapy  Carlos Levering 04/08/2019, 2:13 PM

## 2019-04-08 NOTE — Progress Notes (Signed)
Occupational Therapy Session Note  Patient Details  Name: RONNAE KASER MRN: [id number] Date of Birth: 08-21-1946  Today's Date: 04/08/2019 OT Individual Time: 1342-1405 OT Individual Time Calculation (min): 23 min    Short Term Goals: Week 2:  OT Short Term Goal 1 (Week 2): LTG=STG OT Short Term Goal 2 (Week 2): Pt will use right hand at nondomiant level mod I  Skilled Therapeutic Interventions/Progress Updates:    Patient in bed, requests to use the bathroom.  Supine to SSP with CS.  SPT to/from bed, w/c and toilet with CG A.  Toileting with min A for managing incontinence brief.  Footwear with max A for teds and sneakers.  Grooming tasks seated at the sink with CS w/c level.  Patient pleasant and happy to participate in therapy session.  She continued with therapy session  - See OT note   Therapy Documentation Precautions:  Precautions Precautions: Cervical, Fall Precaution Booklet Issued: Yes (comment) Precaution Comments: reviewed precautions Restrictions Weight Bearing Restrictions: No General:   Vital Signs: Therapy Vitals Temp: 97.9 F (36.6 C) Temp Source: Oral Pulse Rate: (!) 55 Resp: 16 BP: (!) 111/44 Patient Position (if appropriate): Lying Oxygen Therapy SpO2: 100 % O2 Device: Room Air Pain: Pain Assessment Pain Scale: 0-10 Pain Score: 0-No pain   Other Treatments:     Therapy/Group: Individual Therapy  Carlos Levering 04/08/2019, 2:18 PM

## 2019-04-08 NOTE — Progress Notes (Signed)
Physical Therapy Session Note  Patient Details  Name: Teresa Barnes MRN: [id number] Date of Birth: May 26, 1946  Today's Date: 04/08/2019 PT Individual Time: 7416-3845 PT Individual Time Calculation (min): 51 min   Short Term Goals: Week 2:  PT Short Term Goal 1 (Week 2): STG = LTG due to estimated d/c date.    Skilled Therapeutic Interventions/Progress Updates:   Pt received in bed; initially pt reporting significant fatigue due to just returning from OT session.  Discussed with pt importance of participating in PT for maximum benefit and progress and that therapist would work within patient's tolerance.   Able to motivate pt to participate in therapy with going to make coffee.  Following bed mobility, transfers and two sets of gait in hallway pt assisted with coffee making from seated position working on coordination and dexterity of UE to perform functional task.  Returned to room in w/c and transferred to recliner in order to sit up and drink coffee before supper.     Therapy Documentation Precautions:  Precautions Precautions: Cervical, Fall Precaution Booklet Issued: Yes (comment) Precaution Comments: reviewed precautions Restrictions Weight Bearing Restrictions: No General: PT Amount of Missed Time (min): 9 Minutes PT Missed Treatment Reason: Patient fatigue Vital Signs: Therapy Vitals Temp: 97.9 F (36.6 C) Temp Source: Oral Pulse Rate: (!) 55 Resp: 16 BP: (!) 111/44 Patient Position (if appropriate): Lying Oxygen Therapy SpO2: 100 % O2 Device: Room Air Pain: Pain Assessment Pain Scale: 0-10 Pain Score: 0-No pain Mobility: Bed Mobility Bed Mobility: Supine to Sit Rolling Left: Contact Guard/Touching assist Supine to Sit: Minimal Assistance - Patient > 75% Transfers Transfers: Sit to Stand;Stand to [employer] Transfers Sit to Stand: Moderate Assistance - Patient 50-74% Stand to Sit: Moderate Assistance - Patient 50-74% Stand Pivot Transfers: Minimal  Assistance - Patient > 75% Stand Pivot Transfer Details (indicate cue type and reason): For sit <> stand from bed and from w/c pt required cues for correct hand placement and to not pull up on RW.  Once standing pt required intermittent assistance to manage hand placement on orthosis.   Transfer (Assistive device): Rolling walker Locomotion : Gait Ambulation: Yes Gait Assistance: Minimal Assistance - Patient > 75% Gait Distance (Feet): 30 Feet(x 2 ) Assistive device: Rolling walker Gait Assistance Details: Therapist provided assistance for placing RUE on and off of R hand orthosis.  Also provided min A for balance.  Pt ambulating very slowly. Gait Gait Pattern: Impaired Gait Pattern: Step-through pattern;Decreased step length - left;Decreased step length - right;Decreased stride length;Decreased hip/knee flexion - right;Decreased hip/knee flexion - left;Decreased trunk rotation;Decreased dorsiflexion - right;Decreased dorsiflexion - left  Other Treatments: Treatments Therapeutic Activity: Pt assisted with making coffee with peeling back top of creamer packet x 2 with therapist holding and stabilizing packet while pt pincer grasped and peeled top.  Pt also assisted with stirring coffee with spoon and carrying cup in bilat hands when riding back to the room in w/c.      Therapy/Group: Individual Therapy   Rico Junker, PT, DPT 04/08/19    4:30 PM     04/08/2019, 4:26 PM

## 2019-04-08 NOTE — Progress Notes (Signed)
Occupational Therapy Session Note  Patient Details  Name: Teresa Barnes MRN: [id number] Date of Birth: 03/28/1946  Today's Date: 04/08/2019 OT Individual Time: 1405-1445 (session 2) OT Individual Time Calculation (min): 40 min (session 2)   Short Term Goals: Week 1:  OT Short Term Goal 1 (Week 1): Pt will don shirt with min A OT Short Term Goal 1 - Progress (Week 1): Met OT Short Term Goal 2 (Week 1): Pt will use LRAD to don pants with min A OT Short Term Goal 2 - Progress (Week 1): Progressing toward goal OT Short Term Goal 3 (Week 1): Pt will transfer into walk in shower with min A OT Short Term Goal 3 - Progress (Week 1): Met OT Short Term Goal 4 (Week 1): Pt will increase R functional grasp to hold toothbrush OT Short Term Goal 4 - Progress (Week 1): Met  Skilled Therapeutic Interventions/Progress Updates:    1230-1240  10 min 1:1. Pt received in bed with lunch tray and pt reporting "I cant do anything until I eat lunch." Pt states the tray had been in her room for 15 min, but she couldn't eat it until it was cut up. OT educated pt that she needed to request help from staff to cut up food prior when it arrives as meals are cutting into tx time. Pt verbalizes understanding. OT cuts up food and sets up tray. Exited session with pt seated in bed, exit alarm on and call light in reach.  Session 2: Upon reentering room, pt with other OT for direct handoff. OT escorts pt to apartment in w/c for time management. Pt completes reaching activity in standing with LUE while R shoulder has heat pack applied for pain relief and increasing muscle elasticity. Pt loads/unloads bowls from dishwasher in PNF patterns with CGA for standing balance. Pt loads/unloads bowls to countertop with MOD HOH A of RUE for reaching to lowest shelf height after PROM in all directions. OT applies shoe buttons to shoes and pt able to fasten B shoes while seated in chair with feet on footrest. Exited session with pt seated in  bed, exit alarm on,c all light in reach and all needs met  Therapy Documentation Precautions:  Precautions Precautions: Cervical, Fall Precaution Booklet Issued: Yes (comment) Precaution Comments: reviewed precautions Restrictions Weight Bearing Restrictions: No General:   Vital Signs:  Pain:   ADL:   Vision   Perception    Praxis   Exercises:   Other Treatments:     Therapy/Group: Individual Therapy  Tonny Branch 04/08/2019, 12:39 PM

## 2019-04-08 NOTE — Patient Care Conference (Signed)
Inpatient RehabilitationTeam Conference and Plan of Care Update Date: 04/06/2019   Time: 2:35 PM    Patient Name: Teresa Barnes      Medical Record Number: [id number]  Date of Birth: 08/13/1946 Sex: Female         Room/Bed: 4W25C/4W25C-01 Payor Info: Payor: [employer] MEDICARE / Plan: [facility] MEDICARE / Product Type: *No Product type* /    Admitting Diagnosis: TBI Team  Quadriplegia, Incomp C5-8; 16-17days  Admit Date/Time:  03/29/2019  5:59 PM Admission Comments: No comment available   Primary Diagnosis:  <principal problem not specified> Principal Problem: <principal problem not specified>  Patient Active Problem List   Diagnosis Date Noted  . Cervical myelopathy ([place]) 03/29/2019  . S/P cervical spinal fusion 03/26/2019  . Anemia of chronic disease 09/24/2018  . Piriformis syndrome of right side 09/10/2018  . Adhesive bursitis of left shoulder 08/05/2018  . History of right breast cancer 12/09/2017  . Mixed hyperlipidemia 12/09/2017  . Hypertension associated with diabetes ([place]) 12/09/2017  . Hiatal hernia 12/09/2017  . Esophageal stricture 12/09/2017  . Chronic gout 12/09/2017  . Glaucoma 12/09/2017  . Greater trochanteric bursitis of right hip 11/17/2017  . Degenerative arthritis of right knee 07/03/2017  . Normochromic anemia   . H/O: upper GI bleed 07/30/2016  . Right lumbar radiculopathy 01/23/2016  . Arthritis of right acromioclavicular joint 01/02/2016  . Controlled type 2 diabetes mellitus without complication, without long-term current use of insulin ([place]) 04/03/2009    Expected Discharge Date: Expected Discharge Date: 04/13/19  Team Members Present: Physician leading conference: Dr. Alger Simons Social Worker Present: Alfonse Alpers, LCSW Nurse Present: Dwaine Gale, RN PT Present: Lavone Nian, PT OT Present: Cherylynn Ridges, OT SLP Present: Stormy Fabian, SLP PPS Coordinator present : Gunnar Fusi     Current Status/Progress Goal Weekly Team Focus   Medical   cervical stenosis, improving strength. pain improving. bowels have slowed  improve regularity of bowel program  pain control, bowel regimen   Bowel/Bladder   continent of bowel and bladder, LBM 5.20.20  to remain continent of B&B and avoid constipation  Able to remain continent of B&B   Swallow/Nutrition/ Hydration             ADL's   Min A UB dressing, Min/mod A LB dressing/bathing, Min/mod A transfers, improved R shoulder ROm  Supervision overall ADLs and trasnfers  R UE NMR/FMC, transfers, sit<>stand, self-care retraining   Mobility   mod assist bed mobility, min assist transfers & gait with RW & R hand orthosis, poor activity tolerance, min assist stair negotiation  supervision overall with LRAD except stair negotiation with CGA, and downgraded bed mobility to min assist  balance, transfers, gait, bed mobility, endurance, strengthening, education, stairs   Communication             Safety/Cognition/ Behavioral Observations  currently obeys safety plan and follows commands  to continue to remain safe, make proper decisions  to remain safe and make proper decisions   Pain   no pain  to remain free of pain  to remain free of pain   Skin   neck incision is clean with steristrips in place  to remain free of skin breakdown, maintain proper wound care  to remain free of skin breakdown and continue proper wound care    Rehab Goals Patient on target to meet rehab goals: Yes Rehab Goals Revised: none *See Care Plan and progress notes for long and short-term goals.     Barriers  to Discharge  Current Status/Progress Possible Resolutions Date Resolved   Physician    Medical stability;Neurogenic Bowel & Bladder  anxiety     pain control      Nursing                  PT                    OT                  SLP                SW                Discharge Planning/Teaching Needs:  Pt plans to d/c home where her husband will be able to provide 24/7 supervision.   Pt's husband can come in for family education if needed.   Team Discussion:  Pt is making some headway with increased strength and confidence.  Dr. Naaman Plummer did a shoulder injection and it has decreased her pain and she has improved functional movement.  Pt's blood sugars are elevated with the injection and steroid taper.  Pt's bowels need to get moving.  Pt is min A with short distance gait.  She limits herself in PT and is self distracted.  She is mod A with stairs and will need min A at d/c.  Other goals are set for supervision.  Pt is min A for UB dressing with shoulder improvement.  Pt is currently min/mod A LB dressing with supervision level goals.  Revisions to Treatment Plan:  none    Continued Need for Acute Rehabilitation Level of Care: The patient requires daily medical management by a physician with specialized training in physical medicine and rehabilitation for the following conditions: Daily direction of a multidisciplinary physical rehabilitation program to ensure safe treatment while eliciting the highest outcome that is of practical value to the patient.: Yes Daily medical management of patient stability for increased activity during participation in an intensive rehabilitation regime.: Yes Daily analysis of laboratory values and/or radiology reports with any subsequent need for medication adjustment of medical intervention for : Post surgical problems;Mood/behavior problems;Neurological problems   I attest that I was present, lead the team conference, and concur with the assessment and plan of the team.Team conference was held via web/ teleconference due to Katherine - 19.    Olden Klauer, Silvestre Mesi 04/08/2019, 5:42 PM

## 2019-04-08 NOTE — Progress Notes (Signed)
[place] PHYSICAL MEDICINE & REHABILITATION PROGRESS NOTE   Subjective/Complaints: Up in bed. No new issues. Had bm yesterday  ROS: Patient denies fever, rash, sore throat, blurred vision, nausea, vomiting, diarrhea, cough, shortness of breath or chest pain, joint or back pain, headache, or mood change.    Objective:   No results found. No results for input(s): WBC, HGB, HCT, PLT in the last 72 hours. No results for input(s): NA, K, CL, CO2, GLUCOSE, BUN, CREATININE, CALCIUM in the last 72 hours.  Intake/Output Summary (Last 24 hours) at 04/08/2019 1017 Last data filed at 04/08/2019 0900 Gross per 24 hour  Intake 660 ml  Output -  Net 660 ml     Physical Exam: Vital Signs Blood pressure (!) 116/43, pulse (!) 56, temperature 98 F (36.7 C), temperature source Oral, resp. rate 18, height 5\' 4"  (1.626 m), weight 80.7 kg, SpO2 100 %. Constitutional: No distress . Vital signs reviewed. HEENT: EOMI, oral membranes moist Neck: supple Cardiovascular: RRR without murmur. No JVD    Respiratory: CTA Bilaterally without wheezes or rales. Normal effort    GI: BS +, non-tender, non-distended  Musculoskeletal:neck stable in appearance  General: tr  edema RUE.  Comments: improved right shoulder ROM Neurological: She is alertand oriented to person, place, and time. Nocranial nerve deficit. Normal insight and awareness, RUE 3/5 prox to 4/5 distally. LUE 3+/5 prox to  4-4+/5 distally. LE 4 to 4+/5 prox to distal. Decreased sensation R>L to LT/pain continues. DTR's 1+ UE and 2+ LE.   Skin: Skin iswarm. Noerythema.  Marland Kitchen Psychiatric:pleasant and cooperative  Assessment/Plan: 1. Functional deficits secondary to cervical myelopathy which require 3+ hours per day of interdisciplinary therapy in a comprehensive inpatient rehab setting.  Physiatrist is providing close team supervision and 24 hour management of active medical problems listed below.  Physiatrist and rehab team  continue to assess barriers to discharge/monitor patient progress toward functional and medical goals  Care Tool:  Bathing  Bathing activity did not occur: Safety/medical concerns Body parts bathed by patient: Right arm, Left arm, Chest, Abdomen, Front perineal area, Right upper leg, Left upper leg, Face   Body parts bathed by helper: Right lower leg, Left lower leg, Buttocks     Bathing assist Assist Level: Contact Guard/Touching assist     Upper Body Dressing/Undressing Upper body dressing   What is the patient wearing?: Pull over shirt    Upper body assist Assist Level: Minimal Assistance - Patient > 75%    Lower Body Dressing/Undressing Lower body dressing      What is the patient wearing?: Underwear/pull up, Pants     Lower body assist Assist for lower body dressing: Moderate Assistance - Patient 50 - 74%     Toileting Toileting    Toileting assist Assist for toileting: Minimal Assistance - Patient > 75%     Transfers Chair/bed transfer  Transfers assist  Chair/bed transfer activity did not occur: Safety/medical concerns  Chair/bed transfer assist level: Contact Guard/Touching assist     Locomotion Ambulation   Ambulation assist      Assist level: Minimal Assistance - Patient > 75% Assistive device: Walker-rolling(R hand orthosis) Max distance: 45 ft   Walk 10 feet activity   Assist     Assist level: Minimal Assistance - Patient > 75% Assistive device: Walker-rolling(R hand orthosis)   Walk 50 feet activity   Assist Walk 50 feet with 2 turns activity did not occur: Safety/medical concerns  Assist level: Minimal Assistance - Patient > 75%  Assistive device: Walker-rolling    Walk 150 feet activity   Assist Walk 150 feet activity did not occur: Safety/medical concerns         Walk 10 feet on uneven surface  activity   Assist Walk 10 feet on uneven surfaces activity did not occur: Safety/medical concerns          Wheelchair     Assist Will patient use wheelchair at discharge?: No Type of Wheelchair: Manual    Wheelchair assist level: Maximal Assistance - Patient 25 - 49% Max wheelchair distance: 5 ft    Wheelchair 50 feet with 2 turns activity    Assist    Wheelchair 50 feet with 2 turns activity did not occur: Safety/medical concerns       Wheelchair 150 feet activity     Assist Wheelchair 150 feet activity did not occur: Safety/medical concerns        Medical Problem List and Plan: 1.Decreased functional mobilitysecondary to cervical stenosis with myelopathy status with weakness in a central cord pattern. She is status post decompressive anterior cervical discectomy 03/26/2019.No cervical collar needed --Continue CIR therapies including PT, OT   -adhesive capsulitis right shoulder---improved with ROM and injection     2. Antithrombotics: -DVT/anticoagulation:SCDs. dopplers performed and negative -antiplatelet therapy: N/A 3. Pain Management:Oxycodone and Robaxin as needed  -kpad for neck  -right shoulder injection 5/15 with improvement 4. Mood:needs a lot of + reinforcement -antipsychotic agents: N/A 5. Neuropsych: This patientiscapable of making decisions on hisown behalf. 6. Skin/Wound Care:Routine skin checks 7. Fluids/Electrolytes/Nutrition: encourage PO  Continue to push po  -protein for low albumin 8.Diabetes mellitus with peripheral neuropathy. Hemoglobin A1c 6.6. SSI, Glucophage 1000 mg twice daily, Glucotrol XL 5 mg daily if blood sugar greater than 150.    -decrease decadron to 2mg  q 12 hours today  -sugars decreasing  -SSI 9. Hypertension. Lisinopril 5 mg daily 10. Constipation/neurogenic bowel. -senokot-s at HS    -added daily miralax  -working to AM program  -sorbitol and SSE PRN     LOS: 10 days A FACE TO [place] 04/08/2019,  10:17 AM

## 2019-04-09 ENCOUNTER — Inpatient Hospital Stay (HOSPITAL_COMMUNITY): Payer: Medicare Other

## 2019-04-09 ENCOUNTER — Ambulatory Visit: Payer: Medicare Other

## 2019-04-09 ENCOUNTER — Inpatient Hospital Stay (HOSPITAL_COMMUNITY): Payer: Medicare Other | Admitting: Physical Therapy

## 2019-04-09 LAB — GLUCOSE, CAPILLARY
Glucose-Capillary: 258 mg/dL — ABNORMAL HIGH (ref 70–99)
Glucose-Capillary: 181 mg/dL — ABNORMAL HIGH (ref 70–99)
Glucose-Capillary: 174 mg/dL — ABNORMAL HIGH (ref 70–99)

## 2019-04-09 MED ORDER — DEXAMETHASONE 2 MG PO TABS
2.0000 mg | ORAL_TABLET | Freq: Two times a day (BID) | ORAL | Status: DC
  Administered 2019-04-09 – 2019-04-12 (×6): 2 mg via ORAL
  Filled 2019-04-09 (×6): qty 1

## 2019-04-09 NOTE — Progress Notes (Signed)
Occupational Therapy Session Note  Patient Details  Name: Teresa Barnes MRN: [id number] Date of Birth: 1946-05-16  Today's Date: 04/09/2019 OT Individual Time: 1045-1130 OT Individual Time Calculation (min): 45 min    Short Term Goals: Week 1:  OT Short Term Goal 1 (Week 1): Pt will don shirt with min A OT Short Term Goal 1 - Progress (Week 1): Met OT Short Term Goal 2 (Week 1): Pt will use LRAD to don pants with min A OT Short Term Goal 2 - Progress (Week 1): Progressing toward goal OT Short Term Goal 3 (Week 1): Pt will transfer into walk in shower with min A OT Short Term Goal 3 - Progress (Week 1): Met OT Short Term Goal 4 (Week 1): Pt will increase R functional grasp to hold toothbrush OT Short Term Goal 4 - Progress (Week 1): Met Week 2:  OT Short Term Goal 1 (Week 2): LTG=STG OT Short Term Goal 2 (Week 2): Pt will use right hand at nondomiant level mod I Week 3:     Skilled Therapeutic Interventions/Progress Updates:    1:1 Pt finishing with nursing donning a bandage. Pt ambulated to the bathroom with RW with hand orthosis with close supervision and min guard to cross over the threshold into the bathroom. Pt Performed toileting with min A in standing. Pt's husband brought in depends and new pants. Pt able to doff pants and brief; but required A to thread brief; able to thread pants. Pt able to pull them up with steadying A. Pt ambulated to sink with close supervision and stood to wash hands. PT performed functional ambulation at a slow pace with RW from room to RN station with extra time. Returned to room via w/c with setup with coffee in weighted mug.   Therapy Documentation Precautions:  Precautions Precautions: Cervical, Fall Precaution Booklet Issued: Yes (comment) Precaution Comments: reviewed precautions Restrictions Weight Bearing Restrictions: No Pain: No c/o pain in session   Therapy/Group: Individual Therapy  Willeen Cass [facility] 04/09/2019, 1:59 PM

## 2019-04-09 NOTE — Progress Notes (Signed)
Occupational Therapy Session Note  Patient Details  Name: Teresa Barnes MRN: [id number] Date of Birth: 11-10-1946  Today's Date: 04/09/2019 OT Individual Time: 7903-8333 OT Individual Time Calculation (min): 60 min    Short Term Goals: Week 1:  OT Short Term Goal 1 (Week 1): Pt will don shirt with min A OT Short Term Goal 1 - Progress (Week 1): Met OT Short Term Goal 2 (Week 1): Pt will use LRAD to don pants with min A OT Short Term Goal 2 - Progress (Week 1): Progressing toward goal OT Short Term Goal 3 (Week 1): Pt will transfer into walk in shower with min A OT Short Term Goal 3 - Progress (Week 1): Met OT Short Term Goal 4 (Week 1): Pt will increase R functional grasp to hold toothbrush OT Short Term Goal 4 - Progress (Week 1): Met  Skilled Therapeutic Interventions/Progress Updates:    1:1. Pt received in bed reporting "my whole day will be messed up because my neck was bleeding." OT provides therapeutic listening and support. Pt supine>sitting with A for trunk elevation and stand pivot transfer EOB<>w/c<>shower chair with RW or grab bar with CGA. Pt completes bathing with S overall using wash mit for 50% of bathing. Pt completes UB dressing in chair with MIN A for threading RUE (sequence VC head, RUE, LUE, roll shoulders and pull down front). Pt dons pants by laying pants out on floor to thread feet and CGA for standign balnace to pull over hips (OT does 10) over R hip. Pt grooms at sink seated with set up. Exited session with pt seated in w/c, call light inreach and all needs met  Therapy Documentation Precautions:  Precautions Precautions: Cervical, Fall Precaution Booklet Issued: Yes (comment) Precaution Comments: reviewed precautions Restrictions Weight Bearing Restrictions: No General:   Vital Signs: Therapy Vitals BP: (!) 118/45 Pain:   ADL:   Vision   Perception    Praxis   Exercises:   Other Treatments:     Therapy/Group: Individual  Therapy  Tonny Branch 04/09/2019, 9:05 AM

## 2019-04-09 NOTE — Progress Notes (Addendum)
[place] PHYSICAL MEDICINE & REHABILITATION PROGRESS NOTE   Subjective/Complaints: Concerned about some bleeding from surgical wound. Otherwise doing well  ROS: Patient denies fever, rash, sore throat, blurred vision, nausea, vomiting, diarrhea, cough, shortness of breath or chest pain,   headache, or mood change.    Objective:   No results found. No results for input(s): WBC, HGB, HCT, PLT in the last 72 hours. No results for input(s): NA, K, CL, CO2, GLUCOSE, BUN, CREATININE, CALCIUM in the last 72 hours.  Intake/Output Summary (Last 24 hours) at 04/09/2019 1128 Last data filed at 04/08/2019 1900 Gross per 24 hour  Intake 120 ml  Output -  Net 120 ml     Physical Exam: Vital Signs Blood pressure (!) 109/52, pulse (!) 57, temperature 97.6 F (36.4 C), temperature source Oral, resp. rate 18, height 5\' 4"  (1.626 m), weight 80.7 kg, SpO2 100 %. Constitutional: No distress . Vital signs reviewed. HEENT: EOMI, oral membranes moist Neck: surgical incision with oozing of mild amount of blood. Appears otherwise intact with steristrips, surrounding bruises Cardiovascular: RRR without murmur. No JVD    Respiratory: CTA Bilaterally without wheezes or rales. Normal effort    GI: BS +, non-tender, non-distended  Musculoskeletal:neck stable in appearance  General: tr  edema RUE.  Comments: improved right shoulder ROM Neurological: She is alertand oriented to person, place, and time. Nocranial nerve deficit. Normal insight and awareness, RUE 3/5 prox to 4/5 distally. LUE 3+/5 prox to  4-4+/5 distally. LE 4 to 4+/5 prox to distal. Decreased sensation R>L to LT/pain continues. DTR's 1+ UE and 2+ LE.   Skin: Skin iswarm. Noerythema.  Marland Kitchen Psychiatric:pleasant but a little anxious   Assessment/Plan: 1. Functional deficits secondary to cervical myelopathy which require 3+ hours per day of interdisciplinary therapy in a comprehensive inpatient rehab setting.  Physiatrist is  providing close team supervision and 24 hour management of active medical problems listed below.  Physiatrist and rehab team continue to assess barriers to discharge/monitor patient progress toward functional and medical goals  Care Tool:  Bathing  Bathing activity did not occur: Safety/medical concerns Body parts bathed by patient: Right arm, Left arm, Chest, Abdomen, Front perineal area, Right upper leg, Left upper leg, Face, Buttocks, Right lower leg, Left lower leg   Body parts bathed by helper: Right lower leg, Left lower leg, Buttocks     Bathing assist Assist Level: Minimal Assistance - Patient > 75%     Upper Body Dressing/Undressing Upper body dressing   What is the patient wearing?: Pull over shirt    Upper body assist Assist Level: Contact Guard/Touching assist    Lower Body Dressing/Undressing Lower body dressing      What is the patient wearing?: Incontinence brief, Pants     Lower body assist Assist for lower body dressing: Moderate Assistance - Patient 50 - 74%     Toileting Toileting    Toileting assist Assist for toileting: Minimal Assistance - Patient > 75%     Transfers Chair/bed transfer  Transfers assist  Chair/bed transfer activity did not occur: Safety/medical concerns  Chair/bed transfer assist level: Minimal Assistance - Patient > 75%     Locomotion Ambulation   Ambulation assist      Assist level: Minimal Assistance - Patient > 75% Assistive device: Walker-rolling Max distance: 30   Walk 10 feet activity   Assist     Assist level: Minimal Assistance - Patient > 75% Assistive device: Walker-rolling   Walk 50 feet activity  Assist Walk 50 feet with 2 turns activity did not occur: Safety/medical concerns  Assist level: Minimal Assistance - Patient > 75% Assistive device: Walker-rolling    Walk 150 feet activity   Assist Walk 150 feet activity did not occur: Safety/medical concerns         Walk 10 feet on  uneven surface  activity   Assist Walk 10 feet on uneven surfaces activity did not occur: Safety/medical concerns         Wheelchair     Assist Will patient use wheelchair at discharge?: No Type of Wheelchair: Manual    Wheelchair assist level: Maximal Assistance - Patient 25 - 49% Max wheelchair distance: 5 ft    Wheelchair 50 feet with 2 turns activity    Assist    Wheelchair 50 feet with 2 turns activity did not occur: Safety/medical concerns       Wheelchair 150 feet activity     Assist Wheelchair 150 feet activity did not occur: Safety/medical concerns        Medical Problem List and Plan: 1.Decreased functional mobilitysecondary to cervical stenosis with myelopathy status with weakness in a central cord pattern. She is status post decompressive anterior cervical discectomy 03/26/2019.No cervical collar needed --Continue CIR therapies including PT, OT   -adhesive capsulitis right shoulder---improved with ROM and recent injection     2. Antithrombotics: -DVT/anticoagulation:SCDs. dopplers performed and negative -antiplatelet therapy: N/A 3. Pain Management:Oxycodone and Robaxin as needed  -kpad for neck  -right shoulder injection 5/15 with improvement 4. Mood:needs a lot of + reinforcement -antipsychotic agents: N/A 5. Neuropsych: This patientiscapable of making decisions on hisown behalf. 6. Skin/Wound Care:apply dry dressing over neck incision to absorb any drainage  -likely just a small hematoma which managed to leak through incision  -reassured patient and told her we would keep an eye on it.  7. Fluids/Electrolytes/Nutrition: encourage PO  Continue to push po  -protein for low albumin 8.Diabetes mellitus with peripheral neuropathy. Hemoglobin A1c 6.6. SSI, Glucophage 1000 mg twice daily, Glucotrol XL 5 mg daily if blood sugar greater than 150.    -decrease decadron to 2mg  q 12 hours  today  -sugars decreasing  -SSI 9. Hypertension. Lisinopril 5 mg daily 10. Constipation/neurogenic bowel.  -add bm yesterday with sorbitol/SSE -senokot-s at HS    -added daily miralax  -working to AM program       LOS: 11 days A FACE TO [place] 04/09/2019, 11:28 AM

## 2019-04-09 NOTE — Progress Notes (Signed)
Social Work Patient ID: Teresa Barnes, female   DOB: 06-Sep-1946, 73 y.o.   MRN: [id number]   CSW finally caught up with pt to update her on team conference and targeted d/c date of 04-13-19.  She is pleased and thinks she will be ready by then.  CSW asked pt if CSW could call her husband and update him and schedule family education.  She agreed.  CSW spoke with husband who was aware of d/c and he will come for family education on Monday, 04-12-19. He asked about HH and CSW explained that this is therapists who come out to do therapy and do not stay with pt for all day caregiving.  He expressed understanding, but will go it with him again on Monday in person to assure understanding.  CSW will arrange [facility] and order any DME.  CSW remains available as needed.

## 2019-04-09 NOTE — Progress Notes (Signed)
Physical Therapy Session Note  Patient Details  Name: Teresa Barnes MRN: [id number] Date of Birth: 07-17-1946  Today's Date: 04/09/2019 PT Individual Time: 7989-2119 PT Individual Time Calculation (min): 42 min   Short Term Goals: Week 2:  PT Short Term Goal 1 (Week 2): STG = LTG due to estimated d/c date.   Skilled Therapeutic Interventions/Progress Updates:      Therapy Documentation Precautions:  Precautions Precautions: Cervical, Fall Precaution Booklet Issued: Yes (comment) Precaution Comments: reviewed precautions Restrictions Weight Bearing Restrictions: No  Treatment: Pt in wheelchair on arrival to room. Agreeable to PT today. Transported to rehab gym via wheelchair by PTA.  Transfers: sit<>stand with min guard assist, cues on hand placement (pt tends to try to place both hands on walker). Stand pivot transfer wheelchair to/from nustep with RW with right hand orthotic with min guard assist.   Stairs: pt ascended/descended 3 stairs with single rail (on right when facing stairs) sideways with min guard assist and increased time with ascending with cues for technique to "power up" with LE's.   Gait: 130 feet with RW with right hand orthotic, min guard assist. Cues on posture, walker position and for increased step length with gait. Wheelchair brought behind pt for safety. Once pt was seated in wheelchair, PTA transported her the remainder of the way to the Nustep.   Exercise: Nustep level 5 with UE/LE for 8 minutes with goal >/= 40 steps per minute for strengthening and activity tolerance. two short rest break needed halfway through (~45 sec in duration).   Clinical Impression/Progress Update: Pt returned to room via wheelchair and left with belt alarm on, needs in reach. Pt with increased gait distance this session and progressing with stair negotiation. Does continue to report fatigue with activity.    Therapy/Group: Individual Therapy  Lindon Romp, PTA,  CLT 04/09/19, 4:04 PM

## 2019-04-10 DIAGNOSIS — E1169 Type 2 diabetes mellitus with other specified complication: Secondary | ICD-10-CM

## 2019-04-10 DIAGNOSIS — E669 Obesity, unspecified: Secondary | ICD-10-CM

## 2019-04-10 LAB — GLUCOSE, CAPILLARY
Glucose-Capillary: 172 mg/dL — ABNORMAL HIGH (ref 70–99)
Glucose-Capillary: 209 mg/dL — ABNORMAL HIGH (ref 70–99)
Glucose-Capillary: 87 mg/dL (ref 70–99)
Glucose-Capillary: 205 mg/dL — ABNORMAL HIGH (ref 70–99)
Glucose-Capillary: 192 mg/dL — ABNORMAL HIGH (ref 70–99)

## 2019-04-10 NOTE — Progress Notes (Signed)
[facility] PHYSICAL MEDICINE & REHABILITATION PROGRESS NOTE   Subjective/Complaints: Patient wanted me to check her neck wound, no neck pain, no problem swallowing  ROS: Patient denies fever, rash, sore throat, blurred vision, nausea, vomiting, diarrhea, cough, shortness of breath or chest pain,   headache, or mood change.    Objective:   No results found. No results for input(s): WBC, HGB, HCT, PLT in the last 72 hours. No results for input(s): NA, K, CL, CO2, GLUCOSE, BUN, CREATININE, CALCIUM in the last 72 hours.  Intake/Output Summary (Last 24 hours) at 04/10/2019 1124 Last data filed at 04/10/2019 0102 Gross per 24 hour  Intake 720 ml  Output -  Net 720 ml     Physical Exam: Vital Signs Blood pressure (!) 147/42, pulse (!) 55, temperature 98.5 F (36.9 C), temperature source Oral, resp. rate 16, height 5\' 4"  (1.626 m), weight 80.7 kg, SpO2 99 %. Constitutional: No distress . Vital signs reviewed. HEENT: EOMI, oral membranes moist Neck: surgical incision with lateral aspect a mix of dried blood and congealed blood, no active bleeding, no swelling or tenderness Cardiovascular: RRR without murmur. No JVD    Respiratory: CTA Bilaterally without wheezes or rales. Normal effort    GI: BS +, non-tender, non-distended  Musculoskeletal:neck stable in appearance  General: tr  edema RUE.  Comments: improved right shoulder ROM Neurological: She is alertand oriented to person, place, and time. Nocranial nerve deficit. Normal insight and awareness, RUE 3/5 prox to 4/5 distally. LUE 3+/5 prox to  4-4+/5 distally. LE 4 to 4+/5 prox to distal. Decreased sensation R>L to LT/pain continues. DTR's 1+ UE and 2+ LE.   Skin: Skin iswarm. Noerythema.  Marland Kitchen Psychiatric:pleasant but a little anxious   Assessment/Plan: 1. Functional deficits secondary to cervical myelopathy which require 3+ hours per day of interdisciplinary therapy in a comprehensive inpatient rehab  setting.  Physiatrist is providing close team supervision and 24 hour management of active medical problems listed below.  Physiatrist and rehab team continue to assess barriers to discharge/monitor patient progress toward functional and medical goals  Care Tool:  Bathing  Bathing activity did not occur: Safety/medical concerns Body parts bathed by patient: Right arm, Left arm, Chest, Abdomen, Front perineal area, Right upper leg, Left upper leg, Face, Buttocks, Right lower leg, Left lower leg   Body parts bathed by helper: Right lower leg, Left lower leg, Buttocks     Bathing assist Assist Level: Minimal Assistance - Patient > 75%     Upper Body Dressing/Undressing Upper body dressing   What is the patient wearing?: Pull over shirt    Upper body assist Assist Level: Contact Guard/Touching assist    Lower Body Dressing/Undressing Lower body dressing      What is the patient wearing?: Incontinence brief, Pants     Lower body assist Assist for lower body dressing: Moderate Assistance - Patient 50 - 74%     Toileting Toileting    Toileting assist Assist for toileting: Minimal Assistance - Patient > 75%     Transfers Chair/bed transfer  Transfers assist  Chair/bed transfer activity did not occur: Safety/medical concerns  Chair/bed transfer assist level: Minimal Assistance - Patient > 75%     Locomotion Ambulation   Ambulation assist      Assist level: Contact Guard/Touching assist Assistive device: Walker-rolling(right hand orthosis) Max distance: 130   Walk 10 feet activity   Assist     Assist level: Contact Guard/Touching assist Assistive device: Walker-rolling   Walk 50  feet activity   Assist Walk 50 feet with 2 turns activity did not occur: Safety/medical concerns  Assist level: Minimal Assistance - Patient > 75% Assistive device: Walker-rolling    Walk 150 feet activity   Assist Walk 150 feet activity did not occur: Safety/medical  concerns         Walk 10 feet on uneven surface  activity   Assist Walk 10 feet on uneven surfaces activity did not occur: Safety/medical concerns         Wheelchair     Assist Will patient use wheelchair at discharge?: No Type of Wheelchair: Manual    Wheelchair assist level: Maximal Assistance - Patient 25 - 49% Max wheelchair distance: 5 ft    Wheelchair 50 feet with 2 turns activity    Assist    Wheelchair 50 feet with 2 turns activity did not occur: Safety/medical concerns       Wheelchair 150 feet activity     Assist Wheelchair 150 feet activity did not occur: Safety/medical concerns        Medical Problem List and Plan: 1.Decreased functional mobilitysecondary to cervical stenosis with myelopathy status with weakness in a central cord pattern. She is status post decompressive anterior cervical discectomy 03/26/2019.No cervical collar needed --Continue CIR therapies including PT, OT   -adhesive capsulitis right shoulder---improved with ROM and recent injection     2. Antithrombotics: -DVT/anticoagulation:SCDs. dopplers performed and negative -antiplatelet therapy: N/A 3. Pain Management:Oxycodone and Robaxin as needed  -kpad for neck  -right shoulder injection 5/15 with improvement 4. Mood:needs a lot of + reinforcement -antipsychotic agents: N/A 5. Neuropsych: This patientiscapable of making decisions on hisown behalf. 6. Skin/Wound Care:apply dry dressing over neck incision to absorb any drainage  -likely just a small hematoma which managed to leak through incision  -No active drainage on 04/10/2019 7. Fluids/Electrolytes/Nutrition: encourage PO  Continue to push po  -protein for low albumin 8.Diabetes mellitus with peripheral neuropathy. Hemoglobin A1c 6.6. SSI, Glucophage 1000 mg twice daily, Glucotrol XL 5 mg daily if blood sugar greater than 150.    -decrease decadron to 2mg  q 12  hours today  -sugars decreasing   CBG (last 3)  Recent Labs    04/09/19 1654 04/09/19 2121 04/10/19 0644  GLUCAP 174* 172* 209*  CBGs remaining elevated, expect improvement as Decadron tapered 9. Hypertension. Lisinopril 5 mg daily 10. Constipation/neurogenic bowel.  -add bm yesterday with sorbitol/SSE -senokot-s at HS    -added daily miralax  -working to AM program       LOS: 12 days A FACE TO [place]  04/10/2019, 11:24 AM

## 2019-04-11 ENCOUNTER — Inpatient Hospital Stay (HOSPITAL_COMMUNITY): Payer: Medicare Other | Admitting: Occupational Therapy

## 2019-04-11 LAB — GLUCOSE, CAPILLARY
Glucose-Capillary: 263 mg/dL — ABNORMAL HIGH (ref 70–99)
Glucose-Capillary: 163 mg/dL — ABNORMAL HIGH (ref 70–99)
Glucose-Capillary: 274 mg/dL — ABNORMAL HIGH (ref 70–99)
Glucose-Capillary: 361 mg/dL — ABNORMAL HIGH (ref 70–99)
Glucose-Capillary: 415 mg/dL — ABNORMAL HIGH (ref 70–99)

## 2019-04-11 MED ORDER — INSULIN ASPART 100 UNIT/ML ~~LOC~~ SOLN
5.0000 [IU] | Freq: Once | SUBCUTANEOUS | Status: AC
  Administered 2019-04-11: 5 [IU] via SUBCUTANEOUS

## 2019-04-11 NOTE — Progress Notes (Signed)
Occupational Therapy Session Note  Patient Details  Name: Teresa Barnes MRN: [id number] Date of Birth: 1945-12-13  Today's Date: 04/11/2019 OT Individual Time: 1400-1444 OT Individual Time Calculation (min): 44 min   Skilled Therapeutic Interventions/Progress Updates:    Pt greeted in w/c with no c/o pain. Requesting to shower. She completed bathing (sit<stand from TTB), dressing (sit<stand w/c level at sink), and oral care/grooming tasks (sitting at sink) during session. Tx focus placed on B UE coordination, ADL retraining, standing balance, and NMR. All functional stand pivot transfers completed with steady assist using grab bars. She mostly used wash cloths vs wash mitt for bathing, though dropped wash cloths multiple times and needed cues to recognize that she had dropped them. Pt able to dispense soap with both hands while stabilizing container on her leg. She needed assist for reaching feet and buttocks. Steady assist for dynamic standing balance. During dressing tasks, she needed Mod A for shirt and Max A for LB. Had her thread R LE first into pull up and pants. Steady assist for sit<stand and standing balance to elevate LB garments over hips. She needed cues and Min A for fully elevating on Rt side. Cues also provided during oral care/grooming tasks to maximize her functional independence. At end of session pt was left in w/c with safety belt fastened. RN present to change incisional dressing.   Per RN, ok to keep cervical dressing uncovered during shower today.    Therapy Documentation Precautions:  Precautions Precautions: Cervical, Fall Precaution Booklet Issued: Yes (comment) Precaution Comments: reviewed precautions Restrictions Weight Bearing Restrictions: No Vital Signs: Therapy Vitals Temp: 97.9 F (36.6 C) Temp Source: Oral Pulse Rate: 77 Resp: 17 BP: (!) 117/42 Patient Position (if appropriate): Sitting Oxygen Therapy SpO2: 100 % O2 Device: Room Air Pain: Pain  Assessment Pain Scale: 0-10 Pain Score: 4  Pain Location: Neck Pain Orientation: Right Pain Radiating Towards: shoulder Pain Descriptors / Indicators: Sore Patients Stated Pain Goal: 2 Pain Intervention(s): Medication (See eMAR);Repositioned ADL:       Therapy/Group: Individual Therapy  Maitlyn Penza A Jacyln Carmer 04/11/2019, 4:14 PM

## 2019-04-12 ENCOUNTER — Ambulatory Visit (HOSPITAL_COMMUNITY): Payer: Medicare Other | Admitting: Physical Therapy

## 2019-04-12 ENCOUNTER — Encounter (HOSPITAL_COMMUNITY): Payer: Medicare Other | Admitting: Occupational Therapy

## 2019-04-12 ENCOUNTER — Inpatient Hospital Stay (HOSPITAL_COMMUNITY): Payer: Medicare Other | Admitting: Physical Therapy

## 2019-04-12 LAB — GLUCOSE, CAPILLARY
Glucose-Capillary: 184 mg/dL — ABNORMAL HIGH (ref 70–99)
Glucose-Capillary: 172 mg/dL — ABNORMAL HIGH (ref 70–99)
Glucose-Capillary: 142 mg/dL — ABNORMAL HIGH (ref 70–99)
Glucose-Capillary: 231 mg/dL — ABNORMAL HIGH (ref 70–99)

## 2019-04-12 MED ORDER — DEXAMETHASONE 2 MG PO TABS
1.0000 mg | ORAL_TABLET | Freq: Two times a day (BID) | ORAL | Status: DC
  Administered 2019-04-12 – 2019-04-13 (×2): 1 mg via ORAL
  Filled 2019-04-12 (×2): qty 1

## 2019-04-12 NOTE — Progress Notes (Signed)
Occupational Therapy Session Note  Patient Details  Name: Teresa Barnes MRN: [id number] Date of Birth: May 31, 1946  Today's Date: 04/12/2019 OT Individual Time: 3536-1443 OT Individual Time Calculation (min): 61 min    Short Term Goals: Week 2:  OT Short Term Goal 1 (Week 2): LTG=STG OT Short Term Goal 2 (Week 2): Pt will use right hand at nondomiant level mod I  Skilled Therapeutic Interventions/Progress Updates:    Patient and husband present at start of session.  Offered education and practice related to OT skills and functional tasks the patient has been working on in therapy sessions.  Both patient and husband declined review of ADL/shower, functional mobility, transfers, UE mobility, HEP...  We did discuss DME for shower prior to husband leaving - he states that he is going to install a grab bar in the shower.  Discussed the difference between the shower bench and shower seat with back - patient reports that she feels most comfortable with the shower seat.  After exit of husband patient states that she is having a rough day and is too tired to complete adl tasks.  She would participate in UE dexterity and mobility activities - Completed dexterity activity with playing cards - noted improved mobility with practice.  Completed hand writing activities and reviewed exercises that she can complete at the home setting.  She participated in standing for 3 trials for 1-2 minutes each with CS. She remained in the w/c at close of session with seat belt alarm set and call bell in reach.    Therapy Documentation Precautions:  Precautions Precautions: Cervical, Fall Precaution Booklet Issued: Yes (comment) Precaution Comments: reviewed precautions Restrictions Weight Bearing Restrictions: No General: General PT Missed Treatment Reason: Patient unwilling to participate Vital Signs: Therapy Vitals Temp: 98.7 F (37.1 C) Temp Source: Oral Pulse Rate: 61 Resp: 16 BP: (!) 128/42 Patient  Position (if appropriate): Sitting Oxygen Therapy SpO2: 100 % O2 Device: Room Air Pain: Pain Assessment Pain Scale: 0-10 Pain Score: 0-No pain   Other Treatments:     Therapy/Group: Individual Therapy  Carlos Levering 04/12/2019, 4:05 PM

## 2019-04-12 NOTE — Progress Notes (Signed)
[facility] PHYSICAL MEDICINE & REHABILITATION PROGRESS NOTE   Subjective/Complaints: Feels "tired" today. Doesn't know why. Denies any other problems  ROS: Patient denies fever, rash, sore throat, blurred vision, nausea, vomiting, diarrhea, cough, shortness of breath or chest pain,  , headache, or mood change.     Objective:   No results found. No results for input(s): WBC, HGB, HCT, PLT in the last 72 hours. No results for input(s): NA, K, CL, CO2, GLUCOSE, BUN, CREATININE, CALCIUM in the last 72 hours.  Intake/Output Summary (Last 24 hours) at 04/12/2019 0927 Last data filed at 04/12/2019 0700 Gross per 24 hour  Intake 580 ml  Output -  Net 580 ml     Physical Exam: Vital Signs Blood pressure (!) 124/48, pulse (!) 53, temperature 98 F (36.7 C), temperature source Oral, resp. rate 16, height 5\' 4"  (1.626 m), weight 80.7 kg, SpO2 98 %. Constitutional: No distress . Vital signs reviewed. HEENT: EOMI, oral membranes moist Neck: supple, dried blood adhered to sterstrips and gauze.  Cardiovascular: RRR without murmur. No JVD    Respiratory: CTA Bilaterally without wheezes or rales. Normal effort    GI: BS +, non-tender, non-distended  Musculoskeletal:neck stable in appearance  General: tr  edema RUE.  Comments: improved right shoulder ROM Neurological: She is alertand oriented to person, place, and time. Nocranial nerve deficit. Normal insight and awareness, RUE 3/5 prox to 4/5 distally. LUE 3+/5 prox to  4-4+/5 distally. LE 4 to 4+/5 prox to distal. Decreased sensation R>L to LT/pain continues. DTR's 1+ UE and 2+ LE.  ---stable exam Skin: Skin iswarm. Noerythema.  Marland Kitchen Psychiatric:pleasant  Assessment/Plan: 1. Functional deficits secondary to cervical myelopathy which require 3+ hours per day of interdisciplinary therapy in a comprehensive inpatient rehab setting.  Physiatrist is providing close team supervision and 24 hour management of active medical  problems listed below.  Physiatrist and rehab team continue to assess barriers to discharge/monitor patient progress toward functional and medical goals  Care Tool:  Bathing  Bathing activity did not occur: Safety/medical concerns Body parts bathed by patient: Right arm, Left arm, Chest, Abdomen, Front perineal area, Right upper leg, Left upper leg, Face   Body parts bathed by helper: Right lower leg, Left lower leg, Buttocks     Bathing assist Assist Level: Minimal Assistance - Patient > 75%     Upper Body Dressing/Undressing Upper body dressing   What is the patient wearing?: Pull over shirt    Upper body assist Assist Level: Moderate Assistance - Patient 50 - 74%    Lower Body Dressing/Undressing Lower body dressing      What is the patient wearing?: Incontinence brief, Pants     Lower body assist Assist for lower body dressing: Maximal Assistance - Patient 25 - 49%     Toileting Toileting    Toileting assist Assist for toileting: Minimal Assistance - Patient > 75%     Transfers Chair/bed transfer  Transfers assist  Chair/bed transfer activity did not occur: Safety/medical concerns  Chair/bed transfer assist level: Minimal Assistance - Patient > 75%     Locomotion Ambulation   Ambulation assist      Assist level: Contact Guard/Touching assist Assistive device: Walker-rolling(right hand orthosis) Max distance: 130   Walk 10 feet activity   Assist     Assist level: Contact Guard/Touching assist Assistive device: Walker-rolling   Walk 50 feet activity   Assist Walk 50 feet with 2 turns activity did not occur: Safety/medical concerns  Assist level: Minimal  Assistance - Patient > 75% Assistive device: Walker-rolling    Walk 150 feet activity   Assist Walk 150 feet activity did not occur: Safety/medical concerns         Walk 10 feet on uneven surface  activity   Assist Walk 10 feet on uneven surfaces activity did not occur:  Safety/medical concerns         Wheelchair     Assist Will patient use wheelchair at discharge?: No Type of Wheelchair: Manual    Wheelchair assist level: Maximal Assistance - Patient 25 - 49% Max wheelchair distance: 5 ft    Wheelchair 50 feet with 2 turns activity    Assist    Wheelchair 50 feet with 2 turns activity did not occur: Safety/medical concerns       Wheelchair 150 feet activity     Assist Wheelchair 150 feet activity did not occur: Safety/medical concerns        Medical Problem List and Plan: 1.Decreased functional mobilitysecondary to cervical stenosis with myelopathy status with weakness in a central cord pattern. She is status post decompressive anterior cervical discectomy 03/26/2019.No cervical collar needed --Continue CIR therapies including PT, OT   -adhesive capsulitis right shoulder---improved with ROM and recent injection  -ELOS 5/26  -Patient to see Rehab MD/provider in the office for transitional care encounter in 2-4 weeks.    2. Antithrombotics: -DVT/anticoagulation:SCDs. dopplers performed and negative -antiplatelet therapy: N/A 3. Pain Management:Oxycodone and Robaxin as needed  -kpad for neck  -right shoulder injection 5/15 with improvement 4. Mood:needs a lot of + reinforcement -antipsychotic agents: N/A 5. Neuropsych: This patientiscapable of making decisions on hisown behalf. 6. Skin/Wound Care:bleeding stopped from incision  -can remove dry dressing and clean up wound 7. Fluids/Electrolytes/Nutrition: encourage PO  Continue to push po  -protein for low albumin 8.Diabetes mellitus with peripheral neuropathy. Hemoglobin A1c 6.6. SSI, Glucophage 1000 mg twice daily, Glucotrol XL 5 mg daily if blood sugar greater than 150.    -decrease decadron to 1 mg q 12 hours today  -sugars decreasing  -continued mgt at home CBG (last 3)  Recent Labs    04/11/19 2110  04/11/19 2122 04/12/19 0610  GLUCAP 415* 361* 184*  CBGs remaining elevated, expect improvement as Decadron tapered 9. Hypertension. Lisinopril 5 mg daily 10. Constipation/neurogenic bowel.  - -senokot-s at HS    - daily miralax  -working to AM program       LOS: 14 days A FACE TO [place] 04/12/2019, 9:27 AM

## 2019-04-12 NOTE — Progress Notes (Addendum)
Physical Therapy Discharge Summary  Patient Details  Name: Teresa Barnes MRN: [id number] Date of Birth: 06-16-1946  Today's Date: 04/12/2019 PT Individual Time: 1006-1035 PT Individual Time Calculation (min): 29 min   Patient has met 0 of 9 long term goals due to improved activity tolerance, improved balance, improved postural control, increased strength, ability to compensate for deficits, functional use of  right upper extremity and right lower extremity, improved awareness and improved coordination.  Patient to discharge at an ambulatory level CGA with RW, up to mod assist for bed mobility.  Patient's husband was present for hands on caregiver training but pt refused to participate in functional mobility therefore only verbal education was provided to pt & husband.  Reasons goals not met: Pt did not meet any goals 2/2 weakness, decreased endurance, impaired neuromuscular control, and impaired balance. Pt also did not meet goals 2/2 limited motivation and willingness to participate in therapy sessions, and various therapeutic interventions to target specific PT goals.  Recommendation:  Patient will benefit from ongoing skilled PT services in home health setting to continue to advance safe functional mobility, address ongoing impairments in coordination, neuromuscular reeducation, balance, gait, stair negotiation, bed mobility, endurance, transfers, and minimize fall risk.  Equipment: RW with R hand orthosis and 18x18 w/c  Reasons for discharge: discharge from hospital  Patient/family agrees with progress made and goals achieved: Yes  Skilled PT Treatment: Treatment 1: Pt received in bed with husband Herbie [place]) present for caregiver education. Upon PT arrival pt abruptly refusing to participate in therapy, initially reporting she is tired then eventually stating she doesn't want to. Therapist educated Herbie Baltimore on DME recommendations (RW with R hand splint, w/c, and potential need for bed  rails for bed mobility but Herbie Baltimore reports pt has a bed that elevates head/foot of bed and he will assist her with his arm). Educated pt & Robert on use of gait belt, pt's need for assistance with all mobility at this time, remove throw rugs/any tripping hazards in floor, need for 24 hr supervision assist, and especially assistance when negotiating stairs into the house. Reviewed cervical precautions, HHPT f/u, and pt/family voices no further questions. Even attempted to have LCSW encourage pt to participate but pt continues to refuse. Pt requests to stay in w/c, pt left with all needs at hand & husband present. Pt missed 31 minutes of skilled PT treatment 2/2 refusal.  Treatment 2: Attempted to see pt for scheduled PT treatment and to make up time from this morning. Pt received in bed reporting she was just assisted back to bed & refusing to participate in therapy, despite encouragement & educating pt she will not meet established PT goals, with pt reporting "he will still let me go" referencing home. Pt left in bed with all needs at hand.   PT Discharge Precautions/Restrictions Precautions Precautions: Cervical;Fall Restrictions Weight Bearing Restrictions: No  Vision/Perception  Pt wears glasses for reading only at baseline. No changes in baseline vision. Perception WNL.  Cognition Overall Cognitive Status: Within Functional Limits for tasks assessed Arousal/Alertness: Awake/alert Orientation Level: Oriented X4 Memory: Appears intact Awareness: Appears intact Problem Solving: Appears intact Safety/Judgment: Appears intact  Sensation Sensation Light Touch: Appears Intact(BLE) Proprioception: Appears Intact(BLE) Additional Comments: Impaired sensation in BUE Coordination Gross Motor Movements are Fluid and Coordinated: No Fine Motor Movements are Fluid and Coordinated: No Coordination and Movement Description: deconditioning and cervical induced weakness/sensation  Motor   Motor Motor: Abnormal postural alignment and control Motor - Skilled Clinical Observations: generalized deconditioning  Motor - Discharge Observations: generalized deconditioning   Mobility Bed Mobility Bed Mobility: Rolling Right;Rolling Left;Sit to Supine(with hospital bed features) Rolling Right: Moderate Assistance - Patient 50-74% Rolling Left: Moderate Assistance - Patient 50-74% Supine to Sit: Supervision/Verbal cueing Sit to Supine: Moderate Assistance - Patient 50-74% Transfers Transfers: Sit to Stand;Stand to Sit;Stand Pivot Transfers(with RW) Sit to Stand: Contact Guard/Touching assist;Minimal Assistance - Patient > 75% Stand to Sit: Contact Guard/Touching assist;Minimal Assistance - Patient > 75% Stand Pivot Transfers: Contact Guard/Touching assist;Minimal Assistance - Patient > 75% Stand Pivot Transfer Details: Verbal cues for sequencing;Verbal cues for precautions/safety Transfer (Assistive device): Rolling walker  Locomotion  Gait Ambulation: Yes Gait Assistance: Contact Guard/Touching assist Gait Distance (Feet): 130 Feet Assistive device: Rolling walker Gait Gait: Yes Gait Pattern: Impaired Gait Pattern: Decreased step length - right;Decreased step length - left;Decreased stride length;Decreased hip/knee flexion - right;Decreased hip/knee flexion - left;Decreased dorsiflexion - left;Decreased dorsiflexion - right;Decreased weight shift to right;Decreased weight shift to left;Shuffle;Poor foot clearance - left;Poor foot clearance - right Gait velocity: decreased Stairs / Additional Locomotion Stairs: Yes Stairs Assistance: Minimal Assistance - Patient > 75% Stair Management Technique: (1 rail) Number of Stairs: 3 Wheelchair Mobility Wheelchair Mobility: Yes Wheelchair Assistance: Maximal Assistance - Patient 25 - 49% Wheelchair Propulsion: Both upper extremities Wheelchair Parts Management: Needs assistance Distance: 5 ft   Trunk/Postural Assessment   Cervical Assessment Cervical Assessment: Exceptions to WFL(s/p cervical sx) Thoracic Assessment Thoracic Assessment: Within Functional Limits Lumbar Assessment Lumbar Assessment: Within Functional Limits Postural Control Postural Control: Deficits on evaluation Righting Reactions: delayed Protective Responses: delayed   Balance Balance Balance Assessed: Yes Dynamic Standing Balance Dynamic Standing - Balance Support: During functional activity;Bilateral upper extremity supported(with RW) Dynamic Standing - Level of Assistance: (CGA)  Extremity Assessment  Extremities not formally assessed, pt with impaired coordination RUE>LUE, BLE grossly 3+/5 as no buckling noted in weight bearing, BLE dorsiflexion impaired, BLE coordination impaired   [place] 04/12/2019, 3:22 PM

## 2019-04-12 NOTE — Discharge Summary (Signed)
Physician Discharge Summary  Patient ID: Teresa Barnes MRN: [id number] DOB/AGE: 03-27-1946 73 y.o.  Admit date: 03/29/2019 Discharge date: 04/13/2019  Discharge Diagnoses:  Active Problems:   Cervical myelopathy (HCC) DVT prophylaxis Pain management Diabetes mellitus with peripheral neuropathy Hypertension Constipation with neurogenic bowel  Discharged Condition: Stable  Significant Diagnostic Studies: Chest 2 View  Result Date: 03/24/2019 CLINICAL DATA:  Preop evaluation for upcoming neck surgery EXAM: CHEST - 2 VIEW COMPARISON:  07/30/2016 FINDINGS: Cardiac shadows within normal limits. Mild aortic calcifications are seen. The lungs are clear. No acute bony abnormality is noted. Degenerative changes of the thoracic spine are seen. IMPRESSION: No active cardiopulmonary disease. Electronically Signed   By: Inez Catalina M.D.   On: 03/24/2019 18:19   Dg Cervical Spine 1 View  Result Date: 03/26/2019 CLINICAL DATA:  Surgical anterior fusion of C3-4, C4-5 and C5-6. EXAM: DG C-ARM 61-120 MIN; DG CERVICAL SPINE - 1 VIEW FLUOROSCOPY TIME:  22 seconds. COMPARISON:  MRI of March 18, 2019. FINDINGS: Single intraoperative fluoroscopic image of the cervical spine demonstrates the patient be status post surgical anterior fusion of C3-4, C4-5 and C5-6. Good alignment of vertebral bodies is noted. IMPRESSION: Status post surgical anterior fusion of C3-4, C4-5 and C5-6. Electronically Signed   By: Marijo Conception M.D.   On: 03/26/2019 13:40   Dg C-arm 1-60 Min  Result Date: 03/26/2019 CLINICAL DATA:  Surgical anterior fusion of C3-4, C4-5 and C5-6. EXAM: DG C-ARM 61-120 MIN; DG CERVICAL SPINE - 1 VIEW FLUOROSCOPY TIME:  22 seconds. COMPARISON:  MRI of March 18, 2019. FINDINGS: Single intraoperative fluoroscopic image of the cervical spine demonstrates the patient be status post surgical anterior fusion of C3-4, C4-5 and C5-6. Good alignment of vertebral bodies is noted. IMPRESSION: Status post surgical  anterior fusion of C3-4, C4-5 and C5-6. Electronically Signed   By: Marijo Conception M.D.   On: 03/26/2019 13:40   Vas [place] Lower Extremity Venous (dvt)  Result Date: 03/30/2019  Lower Venous Study Indications: Edema.  Performing Technologist: June Leap RDMS, RVT  Examination Guidelines: A complete evaluation includes B-mode imaging, spectral Doppler, color Doppler, and power Doppler as needed of all accessible portions of each vessel. Bilateral testing is considered an integral part of a complete examination. Limited examinations for reoccurring indications may be performed as noted.  +---------+---------------+---------+-----------+----------+-------+ RIGHT    CompressibilityPhasicitySpontaneityPropertiesSummary +---------+---------------+---------+-----------+----------+-------+ CFV      Full           Yes      Yes                          +---------+---------------+---------+-----------+----------+-------+ SFJ      Full                                                 +---------+---------------+---------+-----------+----------+-------+ FV Prox  Full                                                 +---------+---------------+---------+-----------+----------+-------+ FV Mid   Full                                                 +---------+---------------+---------+-----------+----------+-------+  FV DistalFull                                                 +---------+---------------+---------+-----------+----------+-------+ PFV      Full                                                 +---------+---------------+---------+-----------+----------+-------+ POP      Full           Yes      Yes                          +---------+---------------+---------+-----------+----------+-------+ PTV      Full                                                 +---------+---------------+---------+-----------+----------+-------+ PERO     Full                                                  +---------+---------------+---------+-----------+----------+-------+   +---------+---------------+---------+-----------+----------+-------+ LEFT     CompressibilityPhasicitySpontaneityPropertiesSummary +---------+---------------+---------+-----------+----------+-------+ CFV      Full           Yes      Yes                          +---------+---------------+---------+-----------+----------+-------+ SFJ      Full                                                 +---------+---------------+---------+-----------+----------+-------+ FV Prox  Full                                                 +---------+---------------+---------+-----------+----------+-------+ FV Mid   Full                                                 +---------+---------------+---------+-----------+----------+-------+ FV DistalFull                                                 +---------+---------------+---------+-----------+----------+-------+ PFV      Full                                                 +---------+---------------+---------+-----------+----------+-------+ POP  Full           Yes      Yes                          +---------+---------------+---------+-----------+----------+-------+ PTV      Full                                                 +---------+---------------+---------+-----------+----------+-------+ PERO     Full                                                 +---------+---------------+---------+-----------+----------+-------+     Summary: Right: There is no evidence of deep vein thrombosis in the lower extremity. No cystic structure found in the popliteal fossa. Left: There is no evidence of deep vein thrombosis in the lower extremity. No cystic structure found in the popliteal fossa.  *See table(s) above for measurements and observations. Electronically signed by Deitra Mayo MD on 03/30/2019 at 7:01:23 PM.     Final     Labs:  Basic Metabolic Panel: No results for input(s): NA, K, CL, CO2, GLUCOSE, BUN, CREATININE, CALCIUM, MG, PHOS in the last 168 hours.  CBC: No results for input(s): WBC, NEUTROABS, HGB, HCT, MCV, PLT in the last 168 hours.  CBG: Recent Labs  Lab 04/11/19 0611 04/11/19 1149 04/11/19 1645 04/11/19 2110 04/11/19 2122  GLUCAP 263* 163* 274* 415* 361*   Family history.  Mother and daughter with diabetes, daughter with hyperlipidemia, son with diabetes as well as sister with diabetes.  Negative for colon cancer heart disease or breast cancer  Brief HPI:    Teresa Barnes is a 73 year old right-handed female with history of diabetes mellitus, esophageal stricture with dilatation, chronic anemia, breast cancer, gout, hypertension hyperlipidemia.  Per chart review lives with spouse 2 level home.  Independent up until approximately 3 weeks ago needing physical assistance for limited household ambulation.  Presented 03/26/2019 with complaints of numbness of the arms loss of strength and limited gait.  Patient noted fall 3 weeks ago.  X-rays and imaging revealed critical cervical spinal stenosis C3-4, spinal stenosis C4-5 and 5-6 with cervical myelopathy.  Underwent decompressive anterior cervical discectomy with anterior cervical arthrodesis and plating per Dr. Sherley Bounds 03/26/2019.  No cervical collar needed.  Hospital course pain management.  Decadron protocol as indicated.  Therapy evaluations completed and patient was admitted for a comprehensive rehab program  Hospital Course: Teresa Barnes was admitted to rehab 03/29/2019 for inpatient therapies to consist of PT, ST and OT at least three hours five days a week. Past admission physiatrist, therapy team and rehab RN have worked together to provide customized collaborative inpatient rehab.  Pertaining to patient cervical myelopathy had undergone decompressive anterior cervical discectomy 03/26/2019.  Patient would follow-up with  neurosurgery Dr. Sherley Bounds.  No cervical collar needed.  Wound was healing apply dry dressing over neck incision to absorb any drainage suspect small hematoma that managed to leak through incision.  SCDs for DVT prophylaxis venous Doppler studies negative.  Pain management use of oxycodone and Robaxin as needed patient also using a K pad for neck pain.  She did undergo shoulder injection 04/02/2019 for  question adhesive capsulitis.  Diabetes mellitus blood sugars controlled hemoglobin A1c 6.6 diabetic teaching as advised patient would follow-up with her primary MD as she continued on Glucotrol as well as Glucophage.  Close monitoring of blood sugars while Decadron was tapered.  Blood pressures controlled with lisinopril.  Neurogenic bowel bowel program was established.  Physical exam.  Blood pressure 115/50 pulse 63 temperature 98.1 respirations 20 oxygen saturations 98% room air Constitutional.  Alert and oriented well-developed HEENT Head.  Normocephalic and atraumatic Eyes.  Pupils round and reactive to light without discharge no nystagmus Neck normal range of motion no tracheal deviation no thyromegaly without JVD Cardiovascular normal rate and rhythm no gallop no friction rub or murmur heard Respiratory effort normal breath sounds normal no respiratory distress without wheezes GI.  Soft no distention no abdominal tenderness no rebound Musculoskeletal no edema.  Right shoulder with limited passive range of motion.  No cranial nerve deficits normal insight and awareness right upper extremity 2 out of 5 proximal to 3- to 3 distally left upper extremity 3 out of 5 proximal 3+ out of 5 distally.  Lower extremity 4 out of 5 proximal to distal.  Rehab course: During patient's stay in rehab weekly team conferences were held to monitor patient's progress, set goals and discuss barriers to discharge. At admission, patient required minimal assist ambulate 10 feet rolling walker, moderate assist sit to stand,  minimal assist side-lying to sitting, max assist sit to side-lying.  Moderate assist upper body bathing and dressing moderate assist lower body bathing and dressing.  She  has had improvement in activity tolerance, balance, postural control as well as ability to compensate for deficits. She has had improvement in functional use RUE/LUE  and RLE/LLE as well as improvement in awareness.  Patient transfers sit to stand with minimal guard assist.  Stand pivot transfers wheelchair to and from [employer] with rolling walker and minimal guard.  Up and down stairs minimal guard ambulates 130 feet rolling walker with right hand orthotic minimal guard assist.  Patient ambulates to the bathroom with rolling walker with hand orthosis close supervision minimal guard to cross over the threshold into the bathroom.  Perform toileting with minimal assist in standing.  Patient able to doff pants and briefs required some assist to thread brief.  Ambulates to the sink with close supervision and stood to wash hands.  Full family teaching was completed and plan discharged to home       Disposition:  Discharge to home   Diet: Diabetic diet  Special Instructions: No smoking driving or alcohol  Medications at discharge 1.  Tylenol as needed 2.  Zyloprim 100 mg p.o. daily 3 Dulcolax suppository daily as needed 4.  Alphagan ophthalmic solution as well as Timoptic 1 drop left eye twice daily 5.  Folic acid 1 mg p.o. daily 6.  Complete Decadron taper 7.   Xalatan 1 drop both eyes bedtime 8.  Lisinopril 5 mg p.o. daily 9.  Glucophage 1000 mg p.o. twice daily 10.  Robaxin 500 mg every 6 hours as needed muscle spasms 11.  Oxycodone 5 mg every 3 hours as needed pain 12.  Protonix 40 mg p.o. daily 13.  MiraLAX daily hold for loose stools 14.  Senokot S2 tablets p.o. nightly 15.Mevacor 40 mg daily 16 Glucotrol 5 mg daily as needed only takes for blood sugar above 150  Discharge Instructions    Ambulatory referral to  Physical Medicine Rehab   Complete by:  As directed  Moderate complexity follow-up 1 to 2 weeks cervical myelopathy      Follow-up Information    [name], MD Follow up.   Specialty:  Physical Medicine and Rehabilitation Why:  Office to call for appointment Contact information: [address] [place] [address] [phone]        Eustace Moore, MD Follow up.   Specialty:  Neurosurgery Why:  Call for appointment Contact information: [address] [place] [address] [phone]           Signed: Cathlyn Parsons 04/12/2019, 5:37 AM

## 2019-04-13 DIAGNOSIS — E119 Type 2 diabetes mellitus without complications: Secondary | ICD-10-CM

## 2019-04-13 DIAGNOSIS — G959 Disease of spinal cord, unspecified: Secondary | ICD-10-CM | POA: Diagnosis not present

## 2019-04-13 LAB — GLUCOSE, CAPILLARY: Glucose-Capillary: 97 mg/dL (ref 70–99)

## 2019-04-13 MED ORDER — LISINOPRIL 5 MG PO TABS: 5.0000 mg | ORAL_TABLET | Freq: Every day | ORAL | 0 refills | Status: DC

## 2019-04-13 MED ORDER — LOVASTATIN 40 MG PO TABS: 40.0000 mg | ORAL_TABLET | Freq: Every day | ORAL | 3 refills | Status: DC

## 2019-04-13 MED ORDER — SENNOSIDES-DOCUSATE SODIUM 8.6-50 MG PO TABS: 2.0000 | ORAL_TABLET | Freq: Every day | ORAL | Status: DC

## 2019-04-13 MED ORDER — OXYCODONE HCL 5 MG PO TABS: 5.0000 mg | ORAL_TABLET | ORAL | 0 refills | Status: DC | PRN

## 2019-04-13 MED ORDER — METHOCARBAMOL 500 MG PO TABS: 500.0000 mg | ORAL_TABLET | Freq: Four times a day (QID) | ORAL | 0 refills | Status: DC | PRN

## 2019-04-13 MED ORDER — METFORMIN HCL 1000 MG PO TABS: 1000.0000 mg | ORAL_TABLET | Freq: Two times a day (BID) | ORAL | 1 refills | Status: DC

## 2019-04-13 MED ORDER — ALLOPURINOL 100 MG PO TABS: 100.0000 mg | ORAL_TABLET | Freq: Every day | ORAL | 3 refills | Status: DC

## 2019-04-13 MED ORDER — OMEPRAZOLE 40 MG PO CPDR: 40.0000 mg | DELAYED_RELEASE_CAPSULE | Freq: Every day | ORAL | 2 refills | Status: DC

## 2019-04-13 MED ORDER — ACETAMINOPHEN 325 MG PO TABS: 650.0000 mg | ORAL_TABLET | ORAL | Status: DC | PRN

## 2019-04-13 MED ORDER — DEXAMETHASONE 1 MG PO TABS: 1.0000 mg | ORAL_TABLET | Freq: Two times a day (BID) | ORAL | 0 refills | Status: DC

## 2019-04-13 MED ORDER — FOLIC ACID 1 MG PO TABS: 1.0000 mg | ORAL_TABLET | Freq: Every day | ORAL | 0 refills | Status: DC

## 2019-04-13 MED ORDER — POLYETHYLENE GLYCOL 3350 17 G PO PACK: 17.0000 g | PACK | Freq: Every day | ORAL | 0 refills | Status: DC

## 2019-04-13 NOTE — Progress Notes (Signed)
Pt DC home today. Belongings packed and ready to go. Family at main entrance waiting to transport home. PA notified for DC instructions. Pt incision site scant bleeding noted. Cleansed area with NS and applied dry drsg to site. Pt given some supplies to change at home if needed. Pt will be transported to main entrance for DC.  Rosita Fire, RN

## 2019-04-13 NOTE — Progress Notes (Signed)
[facility]ICINE [facility] REHABILITATION PROGRESS NOTE   Subjective/Complaints: Anxious to get home. Pleased with progress.  ROS: Patient denies fever, rash, sore throat, blurred vision, nausea, vomiting, diarrhea, cough, shortness of breath or chest pain,   headache, or mood change.     Objective:   No results found. No results for input(s): WBC, HGB, HCT, PLT in the last 72 hours. No results for input(s): NA, K, CL, CO2, GLUCOSE, BUN, CREATININE, CALCIUM in the last 72 hours.  Intake/Output Summary (Last 24 hours) at 04/13/2019 0909 Last data filed at 04/13/2019 0755 Gross per 24 hour  Intake 720 ml  Output -  Net 720 ml     Physical Exam: Vital Signs Blood pressure (!) 132/45, pulse 60, temperature 98 F (36.7 C), temperature source Oral, resp. rate 16, height 5\' 4"  (1.626 m), weight 80.7 kg, SpO2 100 %. Constitutional: No distress . Vital signs reviewed. HEENT: EOMI, oral membranes moist Neck: supple, neck incision dry, blood mostly removed Cardiovascular: RRR without murmur. No JVD    Respiratory: CTA Bilaterally without wheezes or rales. Normal effort    GI: BS +, non-tender, non-distended  Musculoskeletal:neck stable in appearance  General: tr  edema RUE.  Comments: improved right shoulder ROM Neurological: She is alertand oriented to person, place, and time. Nocranial nerve deficit. Normal insight and awareness, RUE 3/5 prox to 4/5 distally. LUE 3+/5 prox to  4-4+/5 distally. LE 4 to 4+/5 prox to distal. Decreased sensation R>L to LT/pain continues. DTR's 1+ UE and 2+ LE.  ---stable exam Skin: Skin iswarm. Noerythema.  Marland Kitchen Psychiatric:pleasant  Assessment/Plan: 1. Functional deficits secondary to cervical myelopathy which require 3+ hours per day of interdisciplinary therapy in a comprehensive inpatient rehab setting.  Physiatrist is providing close team supervision and 24 hour management of active medical problems listed below.  Physiatrist  and rehab team continue to assess barriers to discharge/monitor patient progress toward functional and medical goals  Care Tool:  Bathing  Bathing activity did not occur: Safety/medical concerns Body parts bathed by patient: Right arm, Left arm, Chest, Abdomen, Front perineal area, Right upper leg, Left upper leg, Face   Body parts bathed by helper: Right lower leg, Left lower leg, Buttocks     Bathing assist Assist Level: Minimal Assistance - Patient > 75%     Upper Body Dressing/Undressing Upper body dressing   What is the patient wearing?: Pull over shirt    Upper body assist Assist Level: Moderate Assistance - Patient 50 - 74%    Lower Body Dressing/Undressing Lower body dressing      What is the patient wearing?: Incontinence brief, Pants     Lower body assist Assist for lower body dressing: Maximal Assistance - Patient 25 - 49%     Toileting Toileting    Toileting assist Assist for toileting: Minimal Assistance - Patient > 75%     Transfers Chair/bed transfer  Transfers assist  Chair/bed transfer activity did not occur: Safety/medical concerns  Chair/bed transfer assist level: Minimal Assistance - Patient > 75%     Locomotion Ambulation   Ambulation assist      Assist level: Contact Guard/Touching assist Assistive device: Walker-rolling(right hand orthosis) Max distance: 130   Walk 10 feet activity   Assist     Assist level: Contact Guard/Touching assist Assistive device: Walker-rolling   Walk 50 feet activity   Assist Walk 50 feet with 2 turns activity did not occur: Safety/medical concerns  Assist level: Minimal Assistance - Patient > 75% Assistive  device: Walker-rolling    Walk 150 feet activity   Assist Walk 150 feet activity did not occur: Safety/medical concerns         Walk 10 feet on uneven surface  activity   Assist Walk 10 feet on uneven surfaces activity did not occur: Safety/medical concerns          Wheelchair     Assist Will patient use wheelchair at discharge?: No Type of Wheelchair: Manual    Wheelchair assist level: Maximal Assistance - Patient 25 - 49% Max wheelchair distance: 5 ft    Wheelchair 50 feet with 2 turns activity    Assist    Wheelchair 50 feet with 2 turns activity did not occur: Safety/medical concerns       Wheelchair 150 feet activity     Assist Wheelchair 150 feet activity did not occur: Safety/medical concerns        Medical Problem List and Plan: 1.Decreased functional mobilitysecondary to cervical stenosis with myelopathy status with weakness in a central cord pattern. She is status post decompressive anterior cervical discectomy 03/26/2019.No cervical collar needed    -ELOS 5/26  -Patient to see Rehab MD/provider in the office for transitional care encounter in 2-4 weeks.    2. Antithrombotics: -DVT/anticoagulation:SCDs. dopplers performed and negative -antiplatelet therapy: N/A 3. Pain Management:Oxycodone and Robaxin as needed  -kpad for neck  -right shoulder injection 5/15 with improvement 4. Mood:needs a lot of + reinforcement -antipsychotic agents: N/A 5. Neuropsych: This patientiscapable of making decisions on hisown behalf. 6. Skin/Wound Care:instructed pt to keep wound clean, may shower and dry well afterwards 7. Fluids/Electrolytes/Nutrition: encourage PO  Continue to push po  -protein for low albumin 8.Diabetes mellitus with peripheral neuropathy. Hemoglobin A1c 6.6. SSI, Glucophage 1000 mg twice daily, Glucotrol XL 5 mg daily if blood sugar greater than 150.    -decrease decadron to 1 mg q 12 hours today---discontinue after today's dose  -sugars decreasing  -continued mgt at home CBG (last 3)  Recent Labs    04/12/19 1629 04/12/19 2108 04/13/19 0608  GLUCAP 142* 231* 97  adjust dm regimen at home based off readings after discontinuation of decadron 9.  Hypertension. Lisinopril 5 mg daily 10. Constipation/neurogenic bowel.  - -senokot-s at HS    - daily miralax          LOS: 15 days A FACE TO FACE EVALUATION WAS PERFORMED  [name] 04/13/2019, 9:09 AM

## 2019-04-13 NOTE — Progress Notes (Signed)
  Patient ID: Teresa Barnes, female   DOB: 10-02-1946, 73 y.o.   MRN: [id number]      Diagnosis codes:  G95.9; M48.02; G99.2; Z47.89  Height:    5'4"            Weight:    80.7 kg/177 lbs        Patient suffers from cervical stenosis with myelopathy which required decompressive anterior cervical discetomy with anterior cervical arthrodesis and anterior cervical plating which impairs her ability to perform daily activities like dressing, toileting, feeding, grooming in the home.  A cane or walker will not resolve issue with performing activities of daily living.  A wheelchair will allow patient to safely perform daily activities.  Patient is not able to propel herself in the home using a standard weight wheelchair due to arm weakness and general weakness.  Patient can self propel in the lightweight wheelchair.

## 2019-04-14 DIAGNOSIS — Z853 Personal history of malignant neoplasm of breast: Secondary | ICD-10-CM | POA: Diagnosis not present

## 2019-04-14 DIAGNOSIS — Z4789 Encounter for other orthopedic aftercare: Secondary | ICD-10-CM | POA: Diagnosis not present

## 2019-04-14 DIAGNOSIS — Z794 Long term (current) use of insulin: Secondary | ICD-10-CM | POA: Diagnosis not present

## 2019-04-14 DIAGNOSIS — M109 Gout, unspecified: Secondary | ICD-10-CM | POA: Diagnosis not present

## 2019-04-14 DIAGNOSIS — M4802 Spinal stenosis, cervical region: Secondary | ICD-10-CM | POA: Diagnosis not present

## 2019-04-14 DIAGNOSIS — E1142 Type 2 diabetes mellitus with diabetic polyneuropathy: Secondary | ICD-10-CM | POA: Diagnosis not present

## 2019-04-14 DIAGNOSIS — I10 Essential (primary) hypertension: Secondary | ICD-10-CM | POA: Diagnosis not present

## 2019-04-14 DIAGNOSIS — K5909 Other constipation: Secondary | ICD-10-CM | POA: Diagnosis not present

## 2019-04-14 DIAGNOSIS — K592 Neurogenic bowel, not elsewhere classified: Secondary | ICD-10-CM | POA: Diagnosis not present

## 2019-04-14 DIAGNOSIS — Z981 Arthrodesis status: Secondary | ICD-10-CM | POA: Diagnosis not present

## 2019-04-14 DIAGNOSIS — D649 Anemia, unspecified: Secondary | ICD-10-CM | POA: Diagnosis not present

## 2019-04-14 NOTE — Progress Notes (Signed)
Social Work Discharge Note  The overall goal for the admission was met for:   Discharge location: Yes - pt's home  Length of Stay: Yes - 15 days  Discharge activity level: No - pt did not meet supervision level goals and will need CGA to mod A - husband stated he can assist at this level  Home/community participation: Yes  Services provided included: MD, RD, PT, OT, RN, Pharmacy and [place]: Private Insurance: [employer]  Follow-up services arranged: Home Health: PT/OT from [facility], DME: 18"x18" lightweight wheelchair with basic cushion; rolling walker; bedside commode; tub transfer bench from [employer] and Patient/Family has no preference for HH/DME agencies  Comments (or additional information): Husband came in for family education, however, pt would not participate in therapies.  Husband told CSW and therapists that he was helping her PTA and he could continue to help.  Patient/Family verbalized understanding of follow-up arrangements: Yes  Individual responsible for coordination of the follow-up plan: pt and her husband, Daysha Ashmore [phone] (h) [phone]  Confirmed correct DME delivered: Trey Sailors 04/14/2019    Martrell Eguia, Silvestre Mesi

## 2019-04-14 NOTE — Discharge Instructions (Signed)
Inpatient Rehab Discharge Instructions  Teresa Barnes Discharge date and time: No discharge date for patient encounter.   Activities/Precautions/ Functional Status: Activity: As tolerated Diet: Diabetic diet Wound Care: Keep wound clean and dry Functional status:  ___ No restrictions     ___ Walk up steps independently ___ 24/7 supervision/assistance   ___ Walk up steps with assistance ___ Intermittent supervision/assistance  ___ Bathe/dress independently ___ Walk with walker     _x__ Bathe/dress with assistance ___ Walk Independently    ___ Shower independently ___ Walk with assistance    ___ Shower with assistance ___ No alcohol     ___ Return to work/school ________  COMMUNITY REFERRALS UPON DISCHARGE:   Home Health:   PT     OT    Agency:  [facility] Equipment/Items Ordered:  18"x18" lightweight wheelchair with basic cushion; rolling walker; bedside commode; tub transfer bench  Agency/Supplier:  [employer]      Phone:  [phone]  Special Instructions: No smoking driving or alcohol   My questions have been answered and I understand these instructions. I will adhere to these goals and the provided educational materials after my discharge from the hospital.  Patient/Caregiver Signature _______________________________ Date __________  Clinician Signature _______________________________________ Date __________  Please bring this form and your medication list with you to all your follow-up doctor's appointments.

## 2019-04-15 ENCOUNTER — Telehealth: Payer: Self-pay | Admitting: *Deleted

## 2019-04-15 DIAGNOSIS — D649 Anemia, unspecified: Secondary | ICD-10-CM | POA: Diagnosis not present

## 2019-04-15 DIAGNOSIS — K592 Neurogenic bowel, not elsewhere classified: Secondary | ICD-10-CM | POA: Diagnosis not present

## 2019-04-15 DIAGNOSIS — M4802 Spinal stenosis, cervical region: Secondary | ICD-10-CM | POA: Diagnosis not present

## 2019-04-15 DIAGNOSIS — I10 Essential (primary) hypertension: Secondary | ICD-10-CM | POA: Diagnosis not present

## 2019-04-15 DIAGNOSIS — E1142 Type 2 diabetes mellitus with diabetic polyneuropathy: Secondary | ICD-10-CM | POA: Diagnosis not present

## 2019-04-15 DIAGNOSIS — G959 Disease of spinal cord, unspecified: Secondary | ICD-10-CM

## 2019-04-15 DIAGNOSIS — Z4789 Encounter for other orthopedic aftercare: Secondary | ICD-10-CM | POA: Diagnosis not present

## 2019-04-15 DIAGNOSIS — Z981 Arthrodesis status: Secondary | ICD-10-CM | POA: Diagnosis not present

## 2019-04-15 DIAGNOSIS — K5909 Other constipation: Secondary | ICD-10-CM | POA: Diagnosis not present

## 2019-04-15 DIAGNOSIS — M109 Gout, unspecified: Secondary | ICD-10-CM | POA: Diagnosis not present

## 2019-04-15 DIAGNOSIS — Z794 Long term (current) use of insulin: Secondary | ICD-10-CM | POA: Diagnosis not present

## 2019-04-15 DIAGNOSIS — Z853 Personal history of malignant neoplasm of breast: Secondary | ICD-10-CM | POA: Diagnosis not present

## 2019-04-15 NOTE — Telephone Encounter (Signed)
Malachy Mood RN [facility] went out to do start of care and changed dressing on neck wound and was calling to confirm Dr Naaman Plummer is the provider for [facility]. I have let her know he will be signing orders.

## 2019-04-15 NOTE — Telephone Encounter (Signed)
I contacted patient for transitional care call.  During the call she asked about securing a hospital type bed. She states she is not sleeping well at all. She states her bed is too low to the ground. The physical therapist was at the house during the call.  She states that if you agree, [facility] medical hi-low bed that can bring mor comfort to patient while she recovers.

## 2019-04-16 ENCOUNTER — Telehealth: Payer: Self-pay | Admitting: *Deleted

## 2019-04-16 DIAGNOSIS — Z981 Arthrodesis status: Secondary | ICD-10-CM | POA: Diagnosis not present

## 2019-04-16 DIAGNOSIS — E1142 Type 2 diabetes mellitus with diabetic polyneuropathy: Secondary | ICD-10-CM | POA: Diagnosis not present

## 2019-04-16 DIAGNOSIS — R2689 Other abnormalities of gait and mobility: Secondary | ICD-10-CM | POA: Diagnosis not present

## 2019-04-16 DIAGNOSIS — K592 Neurogenic bowel, not elsewhere classified: Secondary | ICD-10-CM | POA: Diagnosis not present

## 2019-04-16 DIAGNOSIS — Z794 Long term (current) use of insulin: Secondary | ICD-10-CM | POA: Diagnosis not present

## 2019-04-16 DIAGNOSIS — M4802 Spinal stenosis, cervical region: Secondary | ICD-10-CM | POA: Diagnosis not present

## 2019-04-16 DIAGNOSIS — I10 Essential (primary) hypertension: Secondary | ICD-10-CM | POA: Diagnosis not present

## 2019-04-16 DIAGNOSIS — M109 Gout, unspecified: Secondary | ICD-10-CM | POA: Diagnosis not present

## 2019-04-16 DIAGNOSIS — Z4789 Encounter for other orthopedic aftercare: Secondary | ICD-10-CM | POA: Diagnosis not present

## 2019-04-16 DIAGNOSIS — R293 Abnormal posture: Secondary | ICD-10-CM | POA: Diagnosis not present

## 2019-04-16 DIAGNOSIS — Z853 Personal history of malignant neoplasm of breast: Secondary | ICD-10-CM | POA: Diagnosis not present

## 2019-04-16 DIAGNOSIS — K5909 Other constipation: Secondary | ICD-10-CM | POA: Diagnosis not present

## 2019-04-16 DIAGNOSIS — D649 Anemia, unspecified: Secondary | ICD-10-CM | POA: Diagnosis not present

## 2019-04-16 NOTE — Telephone Encounter (Signed)
Patient notified

## 2019-04-16 NOTE — Progress Notes (Signed)
Occupational Therapy Discharge Summary  Patient Details  Name: Teresa Barnes MRN: [id number] Date of Birth: Apr 03, 1946     Patient has met 5 of 11 long term goals due to improved activity tolerance, improved balance and improved coordination.  Patient to discharge at overall Mod Assist level.  Patient's care partner is independent to provide the necessary physical assistance at discharge.    Reasons goals not met: patient continues with impaired motor control of UEs limiting functional use, husband able to assist post discharge  Recommendation:  Patient will benefit from ongoing skilled OT services in outpatient setting to continue to advance functional skills in the area of BADL and Reduce care partner burden.  Equipment: shower seat with back, husband plans to install grab bars in shower  Reasons for discharge: treatment goals met  Patient/family agrees with progress made and goals achieved: Yes  OT Discharge Precautions/Restrictions  Precautions Precautions: Cervical;Fall Precaution Booklet Issued: Yes (comment) Precaution Comments: reviewed precautions    ADL ADL Eating: Set up Where Assessed-Eating: Wheelchair Grooming: Supervision/safety Where Assessed-Grooming: Sitting at sink Upper Body Bathing: Supervision/safety Where Assessed-Upper Body Bathing: Shower Lower Body Bathing: Minimal assistance Where Assessed-Lower Body Bathing: Shower Upper Body Dressing: Moderate assistance Where Assessed-Upper Body Dressing: Wheelchair Lower Body Dressing: Maximal assistance Where Assessed-Lower Body Dressing: Wheelchair Toileting: Moderate assistance Where Assessed-Toileting: Glass blower/designer: Therapist, music Method: Arts development officer: Energy manager: Curator Method: Radiographer, therapeutic: Civil engineer, contracting with back, Grab bars Vision Baseline Vision/History: Wears  glasses Wears Glasses: Reading only Vision Assessment?: No apparent visual deficits Cognition Overall Cognitive Status: Within Functional Limits for tasks assessed Arousal/Alertness: Awake/alert Orientation Level: Oriented X4 Sensation Sensation Additional Comments: Impaired sensation in BUE, often drops items - proprioception impaired bilateral UEs Coordination Gross Motor Movements are Fluid and Coordinated: No Fine Motor Movements are Fluid and Coordinated: No Coordination and Movement Description: able to hold pen/marker and write name in large print with increased time and effort,  9 Hole Peg Test: Unable to complete 2/2 Research scientist (medical) Motor - Discharge Observations: generalized deconditioning Mobility  Transfers Sit to Stand: Contact Guard/Touching assist;Minimal Assistance - Patient > 75% Stand to Sit: Contact Guard/Touching assist;Minimal Assistance - Patient > 75%  Trunk/Postural Assessment  Postural Control Righting Reactions: delayed Protective Responses: delayed  Geneticist, molecular Standing Balance Static Standing - Balance Support: During functional activity;Left upper extremity supported Static Standing - Level of Assistance: 5: Stand by assistance Dynamic Standing Balance Dynamic Standing - Balance Support: During functional activity;Bilateral upper extremity supported Dynamic Standing - Level of Assistance: 4: Min assist     [employer] 04/16/2019, 4:11 PM

## 2019-04-16 NOTE — Telephone Encounter (Signed)
Transitional care call completed, appointment confirmed, address confirmed New patient packet mailed  Transitional Care Questions   Questions for our staff to ask patients on Transitional care 48 hour phone call:   1. Are you/is patient experiencing any problems since coming home? Still having neck pain, drainage at surgical site  Are there any questions regarding any aspect of care?   2. Are there any questions regarding medications administration/dosing? No  Are meds being taken as prescribed? Yes Patient should review meds with caller to confirm   3. Have there been any falls? No  4. Has Home Health been to the house and/or have they contacted you? RN and PT have been to the house If not, have you tried to contact them? Can we help you contact them?   5. Are bowels and bladder emptying properly? Yes  Are there any unexpected incontinence issues?  No If applicable, is patient following bowel/bladder programs?   6. Any fevers, problems with breathing, unexpected pain? No  7. Are there any skin problems or new areas of breakdown? Surgical site has residual drainage  8. Has the patient/family member arranged specialty MD follow up (ie cardiology/neurology/renal/surgical/etc)? Yes Can we help arrange?   9. Does the patient need any other services or support that we can help arrange? Patient would like a medical bed with high low adjustment to help her through her recovery period. She states she is not sleeping well. Her bed, apparently is too close to the ground   10. Are caregivers following through as expected in assisting the patient? Yes  11. Has the patient quit smoking, drinking alcohol, or using drugs as recommended? Patient does not do these things.

## 2019-04-16 NOTE — Telephone Encounter (Signed)
rx written

## 2019-04-19 DIAGNOSIS — M4802 Spinal stenosis, cervical region: Secondary | ICD-10-CM | POA: Diagnosis not present

## 2019-04-19 DIAGNOSIS — E1142 Type 2 diabetes mellitus with diabetic polyneuropathy: Secondary | ICD-10-CM | POA: Diagnosis not present

## 2019-04-19 DIAGNOSIS — K592 Neurogenic bowel, not elsewhere classified: Secondary | ICD-10-CM | POA: Diagnosis not present

## 2019-04-19 DIAGNOSIS — I10 Essential (primary) hypertension: Secondary | ICD-10-CM | POA: Diagnosis not present

## 2019-04-19 DIAGNOSIS — Z853 Personal history of malignant neoplasm of breast: Secondary | ICD-10-CM | POA: Diagnosis not present

## 2019-04-19 DIAGNOSIS — D649 Anemia, unspecified: Secondary | ICD-10-CM | POA: Diagnosis not present

## 2019-04-19 DIAGNOSIS — M109 Gout, unspecified: Secondary | ICD-10-CM | POA: Diagnosis not present

## 2019-04-19 DIAGNOSIS — Z794 Long term (current) use of insulin: Secondary | ICD-10-CM | POA: Diagnosis not present

## 2019-04-19 DIAGNOSIS — Z4789 Encounter for other orthopedic aftercare: Secondary | ICD-10-CM | POA: Diagnosis not present

## 2019-04-19 DIAGNOSIS — Z981 Arthrodesis status: Secondary | ICD-10-CM | POA: Diagnosis not present

## 2019-04-19 DIAGNOSIS — K5909 Other constipation: Secondary | ICD-10-CM | POA: Diagnosis not present

## 2019-04-20 ENCOUNTER — Encounter: Payer: Self-pay | Admitting: Family Medicine

## 2019-04-20 ENCOUNTER — Encounter: Payer: Medicare Other | Admitting: Family Medicine

## 2019-04-20 NOTE — Progress Notes (Signed)
Erroneous encounter. Please disregard. Pt rescheduled.

## 2019-04-21 ENCOUNTER — Encounter: Payer: Self-pay | Admitting: Registered Nurse

## 2019-04-21 ENCOUNTER — Encounter: Payer: Medicare Other | Attending: Registered Nurse | Admitting: Registered Nurse

## 2019-04-21 ENCOUNTER — Other Ambulatory Visit: Payer: Self-pay

## 2019-04-21 VITALS — BP 146/75 | HR 64 | Temp 98.1°F | Ht 62.0 in | Wt 184.0 lb

## 2019-04-21 DIAGNOSIS — I10 Essential (primary) hypertension: Secondary | ICD-10-CM | POA: Insufficient documentation

## 2019-04-21 DIAGNOSIS — G959 Disease of spinal cord, unspecified: Secondary | ICD-10-CM | POA: Diagnosis not present

## 2019-04-21 DIAGNOSIS — E7849 Other hyperlipidemia: Secondary | ICD-10-CM | POA: Insufficient documentation

## 2019-04-21 DIAGNOSIS — Z981 Arthrodesis status: Secondary | ICD-10-CM | POA: Diagnosis not present

## 2019-04-21 NOTE — Patient Instructions (Signed)
Call Dr Sherley Bounds to schedule a hospital follow up appointment:  [address]  [address]  [phone]

## 2019-04-21 NOTE — Progress Notes (Signed)
Subjective:    Patient ID: Teresa Barnes, female    DOB: Nov 07, 1946, 73 y.o.   MRN: [id number]  HPI: Teresa Barnes is a 73 y.o. female who is here for transitional care visit in follow up of her cervical myelopathy, s/p cervical spinal fusion, hypertension and hyperlipidemia. She presented to [facility] on 03/26/2019 with complaints of numbness of arms and she developed weakness and numbness in her hands and her gait worsened. Teresa Barnes reported a fall 3 weeks ago, Xray ordered.  Cervical X-ray:  Critical cervical spinal stenosis C3-4, spinal stenosis C4-5 and 5-6 with cervical myelopathy. She underwent anterior cervical discectomy Fusion C3,C4C5-C6 Dr. Ronnald Ramp on 03/26/2019,   Teresa Barnes was admitted to inpatient rehabilitation on 03/29/2019 and discharged on 04/13/2019. Teresa Barnes wouldn't participate in therapies, today she is requesting if she can have a personal care aide to assist her with ADL's, referral placed today, she verbalizes understanding.  She was discharged home with [place]. Teresa Barnes denies pain. Reports she has a good appetite.   Husband in room, all questions answered.   Pain Inventory Average Pain 4 Pain Right Now 0 My pain is pulling  In the last 24 hours, has pain interfered with the following? General activity 0 Relation with others 0 Enjoyment of life 0 What TIME of day is your pain at its worst? night Sleep (in general) Fair  Pain is worse with: when turn head in sleep Pain improves with: holding head right Relief from Meds: na  Mobility walk with assistance use a walker  Function retired I need assistance with the following:  dressing, bathing, toileting, meal prep, household duties and shopping  Neuro/Psych weakness numbness tingling trouble walking  Prior Studies Any changes since last visit?  no  Physicians involved in your care Any changes since last visit?  no   Family History  Problem Relation Age of Onset  . Diabetes  Mother   . Diabetes Daughter   . Arthritis Daughter   . Hyperlipidemia Daughter   . Diabetes Son   . Arthritis Son   . Diabetes Sister   . Colon cancer Neg Hx   . Cancer Neg Hx   . Heart disease Neg Hx   . Breast cancer Neg Hx    Social History   Socioeconomic History  . Marital status: Married    Spouse name: robert  . Number of children: 2  . Years of education: Not on file  . Highest education level: Not on file  Occupational History  . Occupation: retired Educational psychologist; Wellsite geologist    Comment: 2006  Social Needs  . Financial resource strain: Not on file  . Food insecurity:    Worry: Not on file    Inability: Not on file  . Transportation needs:    Medical: Not on file    Non-medical: Not on file  Tobacco Use  . Smoking status: Former Smoker    Packs/day: 0.25    Years: 10.00    Pack years: 2.50    Types: Cigarettes    Last attempt to quit: 06/18/1993    Years since quitting: 25.8  . Smokeless tobacco: Never Used  Substance and Sexual Activity  . Alcohol use: No  . Drug use: No  . Sexual activity: Never  Lifestyle  . Physical activity:    Days per week: Not on file    Minutes per session: Not on file  . Stress: Not on file  Relationships  . Social  connections:    Talks on phone: Not on file    Gets together: Not on file    Attends religious service: Not on file    Active member of club or organization: Not on file    Attends meetings of clubs or organizations: Not on file    Relationship status: Not on file  Other Topics Concern  . Not on file  Social History Narrative   Originally from [place], [place]; worked in [employer] for [employer]; longevity in family   Past Surgical History:  Procedure Laterality Date  . ANTERIOR CERVICAL DECOMP/DISCECTOMY FUSION N/A 03/26/2019   Procedure: Anterior Cervical Discectomy Fusion - Cervical Three-Cervical Four - Cervical Four-Cervical Five - Cervical Five-Cervical Six;  Surgeon: Eustace Moore, MD;  Location: [facility];  Service:  Neurosurgery;  Laterality: N/A;  Anterior Cervical Discectomy Fusion - Cervical Three-Cervical Four - Cervical Four-Cervical Five - Cervical Five-Cervical Six  . CATARACT EXTRACTION W/ INTRAOCULAR LENS  IMPLANT, BILATERAL  2000  . COLONOSCOPY    . cyst on spine    . ESOPHAGOGASTRODUODENOSCOPY (EGD) WITH PROPOFOL N/A 07/31/2016   Procedure: ESOPHAGOGASTRODUODENOSCOPY (EGD) WITH PROPOFOL;  Surgeon: Manus Gunning, MD;  Location: [facility];  Service: Gastroenterology;  Laterality: N/A;  . EYE SURGERY  2011   lt eye blled  . MASTECTOMY MODIFIED RADICAL Right 1990  . REDUCTION MAMMAPLASTY Left   . TRIGGER FINGER RELEASE Right 06/24/2013   Procedure: RELEASE TRIGGER FINGER/A-1 PULLEY PIP RIGHT LONG FINGER;  Surgeon: Cammie Sickle., MD;  Location: [facility];  Service: Orthopedics;  Laterality: Right;  . TRIGGER FINGER RELEASE    . TUBAL LIGATION     Past Medical History:  Diagnosis Date  . Anemia   . Anemia of chronic disease 09/24/2018  . Anxiety   . Blood transfusion without reported diagnosis 07/2016  . Cancer ([place]) 1991   breast  . Chronic gout 12/09/2017   On allopurinol  . Diabetes mellitus without complication ([place])   . Esophageal stricture 12/09/2017   S/p dilation by GI: Dr. Scarlette Shorts; due to h/o severe recurrent erosive esophagitis.  . 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 GERD (gastroesophageal reflux disease)   . Glaucoma   . Hiatal hernia 12/09/2017  . Hyperlipidemia   . Hypertension   . Right rotator cuff tear 11/24/2015   Injected this and 26 2017  . Wears glasses    BP (!) 146/75   Pulse 64   Temp 98.1 F (36.7 C)   Ht 5\' 2"  (1.575 m)   Wt 184 lb (83.5 kg)   SpO2 97%   BMI 33.65 kg/m   Opioid Risk Score:   Fall Risk Score:  `1  Depression screen PHQ 2/9  Depression screen [facility] 2/9 04/21/2019 07/15/2018 03/09/2018 12/09/2017  Decreased Interest 0 0 0 0  Down, Depressed, Hopeless 0 0 0 0  PHQ - 2  Score 0 0 0 0  Altered sleeping - - 0 -  Tired, decreased energy - - 0 -  Change in appetite - - 0 -  Feeling bad or failure about yourself  - - 0 -  Trouble concentrating - - 0 -  Moving slowly or fidgety/restless - - 0 -  Suicidal thoughts - - 0 -  PHQ-9 Score - - 0 -  Difficult doing work/chores - - Not difficult at all -   Review of Systems  Constitutional: Negative.   HENT: Negative.   Eyes:  Negative.   Respiratory: Negative.   Cardiovascular: Negative.   Gastrointestinal: Positive for vomiting.  Endocrine: Negative.   Genitourinary: Negative.   Musculoskeletal: Positive for neck pain.  Skin: Negative.   Allergic/Immunologic: Negative.   Neurological: Negative.   Hematological: Negative.   Psychiatric/Behavioral: Negative.   All other systems reviewed and are negative.      Objective:   Physical Exam Vitals signs and nursing note reviewed.  Constitutional:      Appearance: Normal appearance.  Neck:     Musculoskeletal: Normal range of motion and neck supple.     Comments: Right Neck dressing changed. Site cleansed and new dressing applied.  Musculoskeletal:     Comments: Normal Muscle Bulk and Muscle Testing Reveals:  Upper Extremities: Right: Decreased ROM 45 Degrees and Muscle Strength 4/5 Left: Decreased ROM 90 Degrees and Muscle Strength 5/5 Lower Extremities: Full ROM and Muscle Strength 5/5  Arrived in wheelchair  Skin:    General: Skin is warm and dry.  Neurological:     Mental Status: She is alert and oriented to person, place, and time.  Psychiatric:        Mood and Affect: Mood normal.        Behavior: Behavior normal.           Assessment & Plan:  1. Cervical Myelopathy/ S/P Cervical Spinal Fusion: Continue Home Health Therapy. Schedule HFU appointment with Dr.Jones.  2. Hypertension: Continue current medication regimen/. PCP Following. Continue to Monitor.  3. Hyperlipidemia: Continue current medication regimen.   15. minutes of face to  face patient care time was spent during this visit. All questions were encouraged and answered.  F/U in 4-6 weeks with Dr. Naaman Plummer

## 2019-04-22 DIAGNOSIS — Z794 Long term (current) use of insulin: Secondary | ICD-10-CM | POA: Diagnosis not present

## 2019-04-22 DIAGNOSIS — K592 Neurogenic bowel, not elsewhere classified: Secondary | ICD-10-CM | POA: Diagnosis not present

## 2019-04-22 DIAGNOSIS — D649 Anemia, unspecified: Secondary | ICD-10-CM | POA: Diagnosis not present

## 2019-04-22 DIAGNOSIS — M4802 Spinal stenosis, cervical region: Secondary | ICD-10-CM | POA: Diagnosis not present

## 2019-04-22 DIAGNOSIS — Z4789 Encounter for other orthopedic aftercare: Secondary | ICD-10-CM | POA: Diagnosis not present

## 2019-04-22 DIAGNOSIS — Z981 Arthrodesis status: Secondary | ICD-10-CM | POA: Diagnosis not present

## 2019-04-22 DIAGNOSIS — K5909 Other constipation: Secondary | ICD-10-CM | POA: Diagnosis not present

## 2019-04-22 DIAGNOSIS — Z853 Personal history of malignant neoplasm of breast: Secondary | ICD-10-CM | POA: Diagnosis not present

## 2019-04-22 DIAGNOSIS — I10 Essential (primary) hypertension: Secondary | ICD-10-CM | POA: Diagnosis not present

## 2019-04-22 DIAGNOSIS — M109 Gout, unspecified: Secondary | ICD-10-CM | POA: Diagnosis not present

## 2019-04-22 DIAGNOSIS — E1142 Type 2 diabetes mellitus with diabetic polyneuropathy: Secondary | ICD-10-CM | POA: Diagnosis not present

## 2019-04-23 ENCOUNTER — Telehealth: Payer: Self-pay | Admitting: *Deleted

## 2019-04-23 NOTE — Telephone Encounter (Signed)
Teresa Barnes PTA called to report Teresa Barnes declined her visit today with PT.

## 2019-04-28 DIAGNOSIS — I10 Essential (primary) hypertension: Secondary | ICD-10-CM | POA: Diagnosis not present

## 2019-04-28 DIAGNOSIS — K592 Neurogenic bowel, not elsewhere classified: Secondary | ICD-10-CM | POA: Diagnosis not present

## 2019-04-28 DIAGNOSIS — Z853 Personal history of malignant neoplasm of breast: Secondary | ICD-10-CM | POA: Diagnosis not present

## 2019-04-28 DIAGNOSIS — E1142 Type 2 diabetes mellitus with diabetic polyneuropathy: Secondary | ICD-10-CM | POA: Diagnosis not present

## 2019-04-28 DIAGNOSIS — Z981 Arthrodesis status: Secondary | ICD-10-CM | POA: Diagnosis not present

## 2019-04-28 DIAGNOSIS — D649 Anemia, unspecified: Secondary | ICD-10-CM | POA: Diagnosis not present

## 2019-04-28 DIAGNOSIS — Z4789 Encounter for other orthopedic aftercare: Secondary | ICD-10-CM | POA: Diagnosis not present

## 2019-04-28 DIAGNOSIS — M4802 Spinal stenosis, cervical region: Secondary | ICD-10-CM | POA: Diagnosis not present

## 2019-04-28 DIAGNOSIS — M109 Gout, unspecified: Secondary | ICD-10-CM | POA: Diagnosis not present

## 2019-04-28 DIAGNOSIS — K5909 Other constipation: Secondary | ICD-10-CM | POA: Diagnosis not present

## 2019-04-28 DIAGNOSIS — Z794 Long term (current) use of insulin: Secondary | ICD-10-CM | POA: Diagnosis not present

## 2019-04-29 ENCOUNTER — Inpatient Hospital Stay: Payer: Medicare Other | Admitting: Family Medicine

## 2019-04-30 DIAGNOSIS — K5909 Other constipation: Secondary | ICD-10-CM | POA: Diagnosis not present

## 2019-04-30 DIAGNOSIS — K592 Neurogenic bowel, not elsewhere classified: Secondary | ICD-10-CM | POA: Diagnosis not present

## 2019-04-30 DIAGNOSIS — D649 Anemia, unspecified: Secondary | ICD-10-CM | POA: Diagnosis not present

## 2019-04-30 DIAGNOSIS — Z4789 Encounter for other orthopedic aftercare: Secondary | ICD-10-CM | POA: Diagnosis not present

## 2019-04-30 DIAGNOSIS — M4802 Spinal stenosis, cervical region: Secondary | ICD-10-CM | POA: Diagnosis not present

## 2019-04-30 DIAGNOSIS — M109 Gout, unspecified: Secondary | ICD-10-CM | POA: Diagnosis not present

## 2019-04-30 DIAGNOSIS — Z794 Long term (current) use of insulin: Secondary | ICD-10-CM | POA: Diagnosis not present

## 2019-04-30 DIAGNOSIS — Z853 Personal history of malignant neoplasm of breast: Secondary | ICD-10-CM | POA: Diagnosis not present

## 2019-04-30 DIAGNOSIS — E1142 Type 2 diabetes mellitus with diabetic polyneuropathy: Secondary | ICD-10-CM | POA: Diagnosis not present

## 2019-04-30 DIAGNOSIS — Z981 Arthrodesis status: Secondary | ICD-10-CM | POA: Diagnosis not present

## 2019-04-30 DIAGNOSIS — I10 Essential (primary) hypertension: Secondary | ICD-10-CM | POA: Diagnosis not present

## 2019-05-03 ENCOUNTER — Inpatient Hospital Stay: Payer: Medicare Other | Admitting: Family Medicine

## 2019-05-04 DIAGNOSIS — I10 Essential (primary) hypertension: Secondary | ICD-10-CM

## 2019-05-04 DIAGNOSIS — Z853 Personal history of malignant neoplasm of breast: Secondary | ICD-10-CM

## 2019-05-04 DIAGNOSIS — K592 Neurogenic bowel, not elsewhere classified: Secondary | ICD-10-CM

## 2019-05-04 DIAGNOSIS — M109 Gout, unspecified: Secondary | ICD-10-CM

## 2019-05-04 DIAGNOSIS — D649 Anemia, unspecified: Secondary | ICD-10-CM

## 2019-05-04 DIAGNOSIS — Z981 Arthrodesis status: Secondary | ICD-10-CM

## 2019-05-04 DIAGNOSIS — Z4789 Encounter for other orthopedic aftercare: Secondary | ICD-10-CM

## 2019-05-04 DIAGNOSIS — Z794 Long term (current) use of insulin: Secondary | ICD-10-CM

## 2019-05-04 DIAGNOSIS — K5909 Other constipation: Secondary | ICD-10-CM

## 2019-05-04 DIAGNOSIS — E1142 Type 2 diabetes mellitus with diabetic polyneuropathy: Secondary | ICD-10-CM

## 2019-05-04 DIAGNOSIS — M4802 Spinal stenosis, cervical region: Secondary | ICD-10-CM

## 2019-05-14 DIAGNOSIS — G959 Disease of spinal cord, unspecified: Secondary | ICD-10-CM | POA: Diagnosis not present

## 2019-05-17 ENCOUNTER — Encounter: Payer: Self-pay | Admitting: Family Medicine

## 2019-05-17 ENCOUNTER — Ambulatory Visit (INDEPENDENT_AMBULATORY_CARE_PROVIDER_SITE_OTHER): Payer: Medicare Other | Admitting: Family Medicine

## 2019-05-17 ENCOUNTER — Other Ambulatory Visit: Payer: Self-pay

## 2019-05-17 VITALS — BP 132/64 | HR 68 | Temp 98.5°F | Resp 16 | Ht 64.0 in | Wt 184.7 lb

## 2019-05-17 DIAGNOSIS — D638 Anemia in other chronic diseases classified elsewhere: Secondary | ICD-10-CM | POA: Diagnosis not present

## 2019-05-17 DIAGNOSIS — M1A00X0 Idiopathic chronic gout, unspecified site, without tophus (tophi): Secondary | ICD-10-CM | POA: Diagnosis not present

## 2019-05-17 DIAGNOSIS — E119 Type 2 diabetes mellitus without complications: Secondary | ICD-10-CM | POA: Diagnosis not present

## 2019-05-17 DIAGNOSIS — Z981 Arthrodesis status: Secondary | ICD-10-CM | POA: Diagnosis not present

## 2019-05-17 DIAGNOSIS — E1159 Type 2 diabetes mellitus with other circulatory complications: Secondary | ICD-10-CM

## 2019-05-17 DIAGNOSIS — E538 Deficiency of other specified B group vitamins: Secondary | ICD-10-CM

## 2019-05-17 DIAGNOSIS — E782 Mixed hyperlipidemia: Secondary | ICD-10-CM

## 2019-05-17 DIAGNOSIS — I10 Essential (primary) hypertension: Secondary | ICD-10-CM | POA: Diagnosis not present

## 2019-05-17 DIAGNOSIS — R600 Localized edema: Secondary | ICD-10-CM

## 2019-05-17 LAB — CBC WITH DIFFERENTIAL/PLATELET
Basophils Absolute: 0.1 10*3/uL (ref 0.0–0.1)
Basophils Relative: 1.1 % (ref 0.0–3.0)
Eosinophils Absolute: 0.1 10*3/uL (ref 0.0–0.7)
Eosinophils Relative: 1.5 % (ref 0.0–5.0)
HCT: 28.5 % — ABNORMAL LOW (ref 36.0–46.0)
Hemoglobin: 9.5 g/dL — ABNORMAL LOW (ref 12.0–15.0)
Lymphocytes Relative: 26.4 % (ref 12.0–46.0)
Lymphs Abs: 1.9 10*3/uL (ref 0.7–4.0)
MCHC: 33.2 g/dL (ref 30.0–36.0)
MCV: 95.8 fl (ref 78.0–100.0)
Monocytes Absolute: 0.7 10*3/uL (ref 0.1–1.0)
Monocytes Relative: 10 % (ref 3.0–12.0)
Neutro Abs: 4.4 10*3/uL (ref 1.4–7.7)
Neutrophils Relative %: 61 % (ref 43.0–77.0)
Platelets: 368 10*3/uL (ref 150.0–400.0)
RBC: 2.98 Mil/uL — ABNORMAL LOW (ref 3.87–5.11)
RDW: 15.5 % (ref 11.5–15.5)
WBC: 7.3 10*3/uL (ref 4.0–10.5)

## 2019-05-17 LAB — TSH: TSH: 0.5 u[IU]/mL (ref 0.35–4.50)

## 2019-05-17 LAB — POCT GLYCOSYLATED HEMOGLOBIN (HGB A1C): Hemoglobin A1C: 6.7 % — AB (ref 4.0–5.6)

## 2019-05-17 LAB — VITAMIN B12: Vitamin B-12: 501 pg/mL (ref 211–911)

## 2019-05-17 MED ORDER — METFORMIN HCL 1000 MG PO TABS: 1000.0000 mg | ORAL_TABLET | Freq: Two times a day (BID) | ORAL | 1 refills | Status: DC

## 2019-05-17 MED ORDER — FUROSEMIDE 20 MG PO TABS: 20.0000 mg | ORAL_TABLET | Freq: Every day | ORAL | 0 refills | Status: DC | PRN

## 2019-05-17 NOTE — Patient Instructions (Addendum)
Please return in 3 months for diabetes follow up  Please schedule your mammogram.  Please set up an appointment for a diabetic eye exam and have the results sent to me.   You may use the furosemide as needed for leg swelling; I do not recommend using this daily.  Stop the glipizide. Take your metformin twice a day; call me if you have any problems.    If you have any questions or concerns, please don't hesitate to send me a message via MyChart or call the office at [phone]. Thank you for visiting with [place] today! It's our pleasure caring for you.

## 2019-05-17 NOTE — Progress Notes (Signed)
Subjective  CC:  Chief Complaint  Patient presents with  . Diabetes  . Hypertension  . Hospitalization Follow-up     HPI: Teresa Barnes is a 73 y.o. female who presents to the office today for follow up of diabetes, hypertension and problems listed above in the chief complaint.   Patient had hospitalization due to fall and weakness back in May: X-rays and imaging revealed critical cervical spinal stenosis C3-4, spinal stenosis C4-5 and 5-6 with cervical myelopathy.  Underwent decompressive anterior cervical discectomy with anterior cervical arthrodesis and plating per Dr. Sherley Bounds 03/26/2019.  she did well and underwent rehab. I have reviewed all notes and recent f/u appts/lab. She has f/u with NS next week.  She is doing well.  She has completed outpatient physical therapy.  She continues to do exercises at home.  She is ambulating with a walker.  She feels that she is getting stronger.  She continues to have some lower extremity edema, in part due to immobilization and postop recovery.  She was given Lasix in the hospital to help with this.  She denies shortness of breath or orthopnea.   Diabetic f/u: Her diabetic control is reported as Worse.  She has been taking her metformin only once daily.  Sugars were labile in the hospital.  She reports they are more stable now.  Denies symptoms of hypoglycemia. She denies exertional CP or SOB or symptomatic hypoglycemia. She denies foot sores.  She will be due for an eye exam.  Defers urine testing today because she cannot give a sample.  We will get it at next visit.  She is on an ACE inhibitor.   Hypertension f/u: Control is good . Pt reports she is doing well. taking medications as instructed, no medication side effects noted, no TIAs, no chest pain on exertion, no dyspnea on exertion.  She denies adverse effects from his BP medications. Compliance with medication is good.    Hyperlipidemia f/u: Patient presents for follow up of lipids. Lipids  have been well controlled on meds w/o myalgias or side effects. Compliance with treatment thus far has been good. The patient does not use medications that may worsen dyslipidemias (corticosteroids, progestins, anabolic steroids, diuretics, beta-blockers, amiodarone, cyclosporine, olanzapine).    Gout follow-up.  On allopurinol.  Renal function is stable with mild renal insufficiency.  No recent gouty flares.  Due for recheck.   History of anemia of chronic disease, also had iron deficiency anemia and vitamin B12 deficiency in the last year or 2.  She does take a vitamin B super complex.  She is no longer on iron.  Her iron deficiency was due to a peptic ulcer bleed.  She now has a postoperative anemia as well.  Energy is improving Assessment  1. Controlled type 2 diabetes mellitus without complication, without long-term current use of insulin ([place])   2. Mixed hyperlipidemia   3. Anemia of chronic disease   4. Idiopathic chronic gout without tophus, unspecified site   5. S/P cervical spinal fusion   6. Vitamin B12 deficiency   7. Hypertension associated with diabetes ([place])   8. Lower extremity edema      Plan   Diabetes is currently adequately controlled.  Educated on stopping as needed glipizide.  Recommend metformin twice a day instead of daily.  Will continue Victoza.  Control is slightly worse than her usual due to stress of surgery and recent Decadron use.  Will recheck in 3 months.  Patient to schedule  eye exam.  We will need a urine microalbuminuria ratio when she is able to get a sample.  Continue ACE inhibitor.  Hypertension is currently well controlled.  Continue current medications  Hyperlipidemia f/u: LDL is at goal on statin.  Reviewed recent lab work.  Recheck anemia today.  Check vitamin B12 levels.  May warrant short-term iron use as well.  Lower extremity edema status post cervical spinal fusion: Should continue to improve with increased activity.  PRN Lasix educated on  appropriate use.  Should be short-term medication only Diabetic education: ongoing education regarding chronic disease management for diabetes was given today. We continue to reinforce the ABC's of diabetic management: A1c (<7 or 8 dependent upon patient), tight blood pressure control, and cholesterol management with goal LDL < 100 minimally. We discuss diet strategies, exercise recommendations, medication options and possible side effects. At each visit, we review recommended immunizations and preventive care recommendations for diabetics and stress that good diabetic control can prevent other problems. See below for this patient's data. Hypertension education: ongoing education regarding management of these chronic disease states was given. Management strategies discussed on successive visits include dietary and exercise recommendations, goals of achieving and maintaining IBW, and lifestyle modifications aiming for adequate sleep and minimizing stressors.   Follow up: Return in about 3 months (around 08/17/2019) for follow up of diabetes and hypertension..  Orders Placed This Encounter  Procedures  . Lipid panel  . Comprehensive metabolic panel  . TSH  . Microalbumin / creatinine urine ratio  . Uric acid  . Vitamin B12  . CBC with Differential/Platelet  . POCT glycosylated hemoglobin (Hb A1C)   Meds ordered this encounter  Medications  . metFORMIN (GLUCOPHAGE) 1000 MG tablet    Sig: Take 1 tablet (1,000 mg total) by mouth 2 (two) times daily.    Dispense:  180 tablet    Refill:  1  . furosemide (LASIX) 20 MG tablet    Sig: Take 1 tablet (20 mg total) by mouth daily as needed for edema.    Dispense:  30 tablet    Refill:  0      I reviewed the patients updated PMH, FH, and SocHx.  Patient Active Problem List   Diagnosis Date Noted  . Mixed hyperlipidemia 12/09/2017    Priority: High  . Hypertension associated with diabetes ([place]) 12/09/2017    Priority: High  . H/O: upper GI bleed  07/30/2016    Priority: High  . Controlled type 2 diabetes mellitus without complication, without long-term current use of insulin ([place]) 04/03/2009    Priority: High  . Anemia of chronic disease 09/24/2018    Priority: Medium  . History of right breast cancer 12/09/2017    Priority: Medium  . Hiatal hernia 12/09/2017    Priority: Medium  . Esophageal stricture 12/09/2017    Priority: Medium  . Chronic gout 12/09/2017    Priority: Medium  . Glaucoma 12/09/2017    Priority: Medium  . Degenerative arthritis of right knee 07/03/2017    Priority: Medium  . Right lumbar radiculopathy 01/23/2016    Priority: Medium  . Arthritis of right acromioclavicular joint 01/02/2016    Priority: Medium  . Greater trochanteric bursitis of right hip 11/17/2017    Priority: Low  . Normochromic anemia     Priority: Low  . Cervical myelopathy ([place]) 03/29/2019  . S/P cervical spinal fusion 03/26/2019  . Piriformis syndrome of right side 09/10/2018  . Adhesive bursitis of left shoulder 08/05/2018   Immunization  History  Administered Date(s) Administered  . Influenza, High Dose Seasonal PF 08/18/2017, 07/15/2018  . Influenza,inj,Quad PF,6+ Mos 08/01/2016  . Influenza-Unspecified 08/18/2014  . Pneumococcal Conjugate-13 12/09/2017  . Pneumococcal Polysaccharide-23 09/24/2018  . Zoster Recombinat (Shingrix) 10/21/2018, 01/27/2019   Health Maintenance  Topic Date Due  . MAMMOGRAM  02/17/2019  . OPHTHALMOLOGY EXAM  05/19/2019  . INFLUENZA VACCINE  06/19/2019  . HEMOGLOBIN A1C  09/24/2019  . FOOT EXAM  05/16/2020  . DEXA SCAN  02/17/2023  . COLONOSCOPY  06/15/2024  . Hepatitis C Screening  Completed  . PNA vac Low Risk Adult  Completed   Diabetes and HTN Related Lab Review: Lab Results  Component Value Date   HGBA1C 6.7 (A) 05/17/2019   HGBA1C 6.6 (H) 03/24/2019   HGBA1C 6.2 (A) 12/28/2018    Lab Results  Component Value Date   MICROALBUR 8.1 (H) 03/09/2018   Lab Results  Component  Value Date   CREATININE 1.11 (H) 03/30/2019   BUN 28 (H) 03/30/2019   NA 137 03/30/2019   K 4.6 03/30/2019   CL 104 03/30/2019   CO2 23 03/30/2019   Lab Results  Component Value Date   CHOL 175 03/09/2018   CHOL 203 (H) 12/09/2017   Lab Results  Component Value Date   HDL 64.30 03/09/2018   HDL 57.60 12/09/2017   Lab Results  Component Value Date   LDLCALC 95 03/09/2018   LDLCALC 125 (H) 12/09/2017   Lab Results  Component Value Date   TRIG 82.0 03/09/2018   TRIG 103.0 12/09/2017   Lab Results  Component Value Date   CHOLHDL 3 03/09/2018   CHOLHDL 4 12/09/2017   No results found for: LDLDIRECT The 10-year ASCVD risk score Mikey Bussing DC Jr., et al., 2013) is: 30.9%   Values used to calculate the score:     Age: 51 years     Sex: Female     Is Non-Hispanic African American: Yes     Diabetic: Yes     Tobacco smoker: No     Systolic Blood Pressure: 967 mmHg     Is BP treated: Yes     HDL Cholesterol: 64.3 mg/dL     Total Cholesterol: 175 mg/dL  Lab Results  Component Value Date   WBC 7.9 03/30/2019   HGB 9.4 (L) 03/30/2019   HCT 27.8 (L) 03/30/2019   MCV 90.6 03/30/2019   PLT 339 03/30/2019   Lab Results  Component Value Date   CREATININE 1.11 (H) 03/30/2019   BUN 28 (H) 03/30/2019   NA 137 03/30/2019   K 4.6 03/30/2019   CL 104 03/30/2019   CO2 23 03/30/2019     BP Readings from Last 3 Encounters:  05/17/19 132/64  04/21/19 (!) 146/75  04/13/19 (!) 132/45   Wt Readings from Last 3 Encounters:  05/17/19 184 lb 11.9 oz (83.8 kg)  04/21/19 184 lb (83.5 kg)  03/29/19 177 lb 14.6 oz (80.7 kg)    Allergies: Patient is allergic to prednisone and tramadol. Family History: Patient family history includes Arthritis in her daughter and son; Diabetes in her daughter, mother, sister, and son; Hyperlipidemia in her daughter. Social History:  Patient  reports that she quit smoking about 25 years ago. Her smoking use included cigarettes. She has a 2.50  pack-year smoking history. She has never used smokeless tobacco. She reports that she does not drink alcohol or use drugs.  Review of Systems: Ophthalmic: negative for eye pain, loss of vision or double vision  Cardiovascular: negative for chest pain Respiratory: negative for SOB or persistent cough Gastrointestinal: negative for abdominal pain Genitourinary: negative for dysuria or gross hematuria MSK: negative for foot lesions Neurologic: Positive for weakness or gait disturbance Current Meds  Medication Sig  . allopurinol (ZYLOPRIM) 100 MG tablet Take 1 tablet (100 mg total) by mouth daily.  . bimatoprost (LUMIGAN) 0.01 % SOLN Place 1 drop into both eyes at bedtime.   . COMBIGAN 0.2-0.5 % ophthalmic solution Place 1 drop into the left eye 2 (two) times a day.   . folic acid (FOLVITE) 1 MG tablet Take 1 tablet (1 mg total) by mouth daily.  Marland Kitchen lisinopril (ZESTRIL) 5 MG tablet Take 1 tablet (5 mg total) by mouth daily.  Marland Kitchen lovastatin (MEVACOR) 40 MG tablet Take 1 tablet (40 mg total) by mouth at bedtime.  . metFORMIN (GLUCOPHAGE) 1000 MG tablet Take 1 tablet (1,000 mg total) by mouth 2 (two) times daily.  Marland Kitchen omeprazole (PRILOSEC) 40 MG capsule Take 1 capsule (40 mg total) by mouth daily.  . SUPER B COMPLEX/C PO Take 1 tablet by mouth daily.   Marland Kitchen VICTOZA 18 MG/3ML SOPN Inject 0.3 mLs (1.8 mg total) into the skin daily.  . [DISCONTINUED] glipiZIDE (GLUCOTROL XL) 5 MG 24 hr tablet Take 5 mg by mouth daily as needed (ONLY TAKES FOR BLOOD SUGAR ABOVE 150).  . [DISCONTINUED] metFORMIN (GLUCOPHAGE) 1000 MG tablet Take 1 tablet (1,000 mg total) by mouth 2 (two) times daily. (Patient taking differently: Take 1,000 mg by mouth 2 (two) times daily. Patient taking once daily)    Objective  Vitals: BP 132/64   Pulse 68   Temp 98.5 F (36.9 C) (Oral)   Resp 16   Ht 5\' 4"  (1.626 m)   Wt 184 lb 11.9 oz (83.8 kg)   SpO2 98%   BMI 31.71 kg/m  General: well appearing, no acute distress, sitting  comfortably in wheelchair Psych:  Alert and oriented, normal mood and affect HEENT:  Normocephalic, atraumatic, moist mucous membranes, supple neck  Cardiovascular:  Nl S1 and S2, RRR without murmur, gallop or rub.  +1 pitting edema bilaterally  respiratory:  Good breath sounds bilaterally, CTAB with normal effort, no rales Gastrointestinal: normal BS, soft, nontender Skin:  Warm, no rashes Neurologic:   Mental status is normal Foot exam: no erythema, pallor, or cyanosis visible nl proprioception and sensation to monofilament testing bilaterally, +2 distal pulses bilaterally   Commons side effects, risks, benefits, and alternatives for medications and treatment plan prescribed today were discussed, and the patient expressed understanding of the given instructions. Patient is instructed to call or message via MyChart if he/she has any questions or concerns regarding our treatment plan. No barriers to understanding were identified. We discussed Red Flag symptoms and signs in detail. Patient expressed understanding regarding what to do in case of urgent or emergency type symptoms.   Medication list was reconciled, printed and provided to the patient in AVS. Patient instructions and summary information was reviewed with the patient as documented in the AVS. This note was prepared with assistance of Dragon voice recognition software. Occasional wrong-word or sound-a-like substitutions may have occurred due to the inherent limitations of voice recognition software

## 2019-05-18 ENCOUNTER — Ambulatory Visit: Payer: Medicare Other | Admitting: Podiatry

## 2019-05-18 LAB — COMPREHENSIVE METABOLIC PANEL
ALT: 13 U/L (ref 0–35)
AST: 13 U/L (ref 0–37)
Albumin: 3.7 g/dL (ref 3.5–5.2)
Alkaline Phosphatase: 63 U/L (ref 39–117)
BUN: 22 mg/dL (ref 6–23)
CO2: 25 mEq/L (ref 19–32)
Calcium: 9.3 mg/dL (ref 8.4–10.5)
Chloride: 104 mEq/L (ref 96–112)
Creatinine, Ser: 1.17 mg/dL (ref 0.40–1.20)
GFR: 54.61 mL/min — ABNORMAL LOW (ref >60.00)
Glucose, Bld: 94 mg/dL (ref 70–99)
Potassium: 4.5 mEq/L (ref 3.5–5.1)
Sodium: 138 mEq/L (ref 135–145)
Total Bilirubin: 0.4 mg/dL (ref 0.2–1.2)
Total Protein: 6.2 g/dL (ref 6.0–8.3)

## 2019-05-18 LAB — LIPID PANEL
Cholesterol: 180 mg/dL (ref 0–200)
HDL: 48.7 mg/dL (ref >39.00)
LDL Cholesterol: 113 mg/dL — ABNORMAL HIGH (ref 0–99)
NonHDL: 130.98
Total CHOL/HDL Ratio: 4
Triglycerides: 90 mg/dL (ref 0.0–149.0)
VLDL: 18 mg/dL (ref 0.0–40.0)

## 2019-05-18 LAB — URIC ACID: Uric Acid, Serum: 9.1 mg/dL — ABNORMAL HIGH (ref 2.4–7.0)

## 2019-05-19 ENCOUNTER — Other Ambulatory Visit: Payer: Self-pay | Admitting: *Deleted

## 2019-05-19 ENCOUNTER — Encounter: Payer: Self-pay | Admitting: *Deleted

## 2019-05-19 MED ORDER — ALLOPURINOL 300 MG PO TABS: 300.0000 mg | ORAL_TABLET | Freq: Every day | ORAL | 3 refills | Status: DC

## 2019-05-19 MED ORDER — ROSUVASTATIN CALCIUM 20 MG PO TABS: 20.0000 mg | ORAL_TABLET | Freq: Every day | ORAL | 3 refills | Status: DC

## 2019-05-19 NOTE — Progress Notes (Signed)
Please call patient: I have reviewed his/her lab results. I recommend a few medication changes: need to change to a stronger statin. Stop lovastatin and start crestor 20mg  nightly. Please order. Need to increase allopurinol to 300mg  nightly please order.   Postoperative anemia is present. Continue vit b12 and daily iron supplement.  Other test are ok.

## 2019-05-20 DIAGNOSIS — G959 Disease of spinal cord, unspecified: Secondary | ICD-10-CM | POA: Diagnosis not present

## 2019-05-27 ENCOUNTER — Telehealth: Payer: Self-pay | Admitting: Family Medicine

## 2019-05-27 NOTE — Telephone Encounter (Signed)
See note

## 2019-05-27 NOTE — Telephone Encounter (Signed)
Called and spoke with pt, she reports that she has been taking the Furosemide 20 mg over a week now and has had very minimal decrease in swelling. She is wanting to know if medication can be increased to 40 mg, which has been helpful in the pass to decrease swelling. She is aware Dr. Jonni Sanger out of office until Monday and I will call back once a decision has been made.  Please advise

## 2019-05-27 NOTE — Telephone Encounter (Signed)
Patient is calling to discuss her medication furosemide (LASIX) 20 MG tablet [[id number]]  The Patient previously was on 40MG  2018 when she had got and now is on 20MG . And does not feel that the medication is effective. Her feet are still swelling Please advise thank you [phone]- [phone]

## 2019-05-31 NOTE — Telephone Encounter (Signed)
Please schedule for virtual visit so I can assess her edema and adjust medications.  Please ask her to weight herself daily.   Thanks.

## 2019-05-31 NOTE — Telephone Encounter (Signed)
Spoe with pt, she is aware to weight daily and has been scheduled for 7/15

## 2019-06-02 ENCOUNTER — Telehealth: Payer: Self-pay | Admitting: *Deleted

## 2019-06-02 ENCOUNTER — Ambulatory Visit: Payer: Medicare Other | Admitting: Family Medicine

## 2019-06-02 NOTE — Telephone Encounter (Signed)
Called pt to f/u on why appointment today was cancelled. She reports that swelling has finally decreased and will callback if gets worse.

## 2019-06-03 ENCOUNTER — Encounter: Payer: Self-pay | Admitting: Family Medicine

## 2019-06-03 ENCOUNTER — Ambulatory Visit (INDEPENDENT_AMBULATORY_CARE_PROVIDER_SITE_OTHER): Payer: Medicare Other | Admitting: Family Medicine

## 2019-06-03 ENCOUNTER — Ambulatory Visit: Payer: Self-pay

## 2019-06-03 ENCOUNTER — Other Ambulatory Visit: Payer: Self-pay

## 2019-06-03 VITALS — BP 120/62 | HR 69 | Ht 64.0 in | Wt 184.0 lb

## 2019-06-03 DIAGNOSIS — G8929 Other chronic pain: Secondary | ICD-10-CM

## 2019-06-03 DIAGNOSIS — M7551 Bursitis of right shoulder: Secondary | ICD-10-CM

## 2019-06-03 DIAGNOSIS — M25511 Pain in right shoulder: Secondary | ICD-10-CM | POA: Diagnosis not present

## 2019-06-03 NOTE — Progress Notes (Signed)
[name] [address] [place], [place] [address] Phone: [phone] Subjective:   I Teresa Barnes am serving as a Education administrator for Dr. Hulan Saas.  I'm seeing this patient by the request  of:    CC: Right shoulder pain follow-up  FGH:[id number]   01/21/2019 Patient does have chronic gout.  Seems to be in the fifth finger.  Discussed with patient in great length.  Concern for type of injections will also cover with doxycycline.  3-day course of colchicine but warned cannot do long time but if not completely resolved given meloxicam at a lower dose.  We discussed prednisone which patient declined.  Patient is also a diabetic.  Patient will be following up with me again in 1 to 2 weeks.  Continue the allopurinol and may need to increase from 100 mg to 300 mg at follow-up  06/03/2019 Teresa Barnes is a 73 y.o. female coming in with complaint of right shoulder pain. States that she is in some pain today.  Patient has had bursitis previously and does have some underlying arthritic changes.  Patient recently did have neck surgery.  States that the neck is feeling somewhat better.  Continues to have pain in the right shoulder.  Wakes her up at night.  Radiates somewhat down her arm but not as severe.  Seems to stay localized and worse with certain motions.      Past Medical History:  Diagnosis Date  . Anemia   . Anemia of chronic disease 09/24/2018  . Anxiety   . Blood transfusion without reported diagnosis 07/2016  . Cancer ([place]) 1991   breast  . Chronic gout 12/09/2017   On allopurinol  . Diabetes mellitus without complication ([place])   . Esophageal stricture 12/09/2017   S/p dilation by GI: Dr. Scarlette Shorts; due to h/o severe recurrent erosive esophagitis.  . 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 GERD (gastroesophageal reflux disease)   . Glaucoma   . Hiatal hernia 12/09/2017  . Hyperlipidemia   . Hypertension   . Right rotator  cuff tear 11/24/2015   Injected this and 26 2017  . Wears glasses    Past Surgical History:  Procedure Laterality Date  . ANTERIOR CERVICAL DECOMP/DISCECTOMY FUSION N/A 03/26/2019   Procedure: Anterior Cervical Discectomy Fusion - Cervical Three-Cervical Four - Cervical Four-Cervical Five - Cervical Five-Cervical Six;  Surgeon: Eustace Moore, MD;  Location: [facility];  Service: Neurosurgery;  Laterality: N/A;  Anterior Cervical Discectomy Fusion - Cervical Three-Cervical Four - Cervical Four-Cervical Five - Cervical Five-Cervical Six  . CATARACT EXTRACTION W/ INTRAOCULAR LENS  IMPLANT, BILATERAL  2000  . COLONOSCOPY    . cyst on spine    . ESOPHAGOGASTRODUODENOSCOPY (EGD) WITH PROPOFOL N/A 07/31/2016   Procedure: ESOPHAGOGASTRODUODENOSCOPY (EGD) WITH PROPOFOL;  Surgeon: Manus Gunning, MD;  Location: [facility];  Service: Gastroenterology;  Laterality: N/A;  . EYE SURGERY  2011   lt eye blled  . MASTECTOMY MODIFIED RADICAL Right 1990  . REDUCTION MAMMAPLASTY Left   . TRIGGER FINGER RELEASE Right 06/24/2013   Procedure: RELEASE TRIGGER FINGER/A-1 PULLEY PIP RIGHT LONG FINGER;  Surgeon: Cammie Sickle., MD;  Location: [facility];  Service: Orthopedics;  Laterality: Right;  . TRIGGER FINGER RELEASE    . TUBAL LIGATION     Social History   Socioeconomic History  . Marital status: Married    Spouse name: robert  . Number of  children: 2  . Years of education: Not on file  . Highest education level: Not on file  Occupational History  . Occupation: retired Educational psychologist; [employer]ologist    Comment: 2006  Social Needs  . Financial resource strain: Not on file  . Food insecurity    Worry: Not on file    Inability: Not on file  . Transportation needs    Medical: Not on file    Non-medical: Not on file  Tobacco Use  . Smoking status: Former Smoker    Packs/day: 0.25    Years: 10.00    Pack years: 2.50    Types: Cigarettes    Quit date: 06/18/1993    Years since  quitting: 25.9  . Smokeless tobacco: Never Used  Substance and Sexual Activity  . Alcohol use: No  . Drug use: No  . Sexual activity: Never  Lifestyle  . Physical activity    Days per week: Not on file    Minutes per session: Not on file  . Stress: Not on file  Relationships  . Social Herbalist on phone: Not on file    Gets together: Not on file    Attends religious service: Not on file    Active member of club or organization: Not on file    Attends meetings of clubs or organizations: Not on file    Relationship status: Not on file  Other Topics Concern  . Not on file  Social History Narrative   Originally from [place], [place]; worked in [employer] for [employer]; longevity in family   Allergies  Allergen Reactions  . Prednisone Nausea And Vomiting  . Tramadol Nausea Only   Family History  Problem Relation Age of Onset  . Diabetes Mother   . Diabetes Daughter   . Arthritis Daughter   . Hyperlipidemia Daughter   . Diabetes Son   . Arthritis Son   . Diabetes Sister   . Colon cancer Neg Hx   . Cancer Neg Hx   . Heart disease Neg Hx   . Breast cancer Neg Hx     Current Outpatient Medications (Endocrine & Metabolic):  .  metFORMIN (GLUCOPHAGE) 1000 MG tablet, Take 1 tablet (1,000 mg total) by mouth 2 (two) times daily. Marland Kitchen  VICTOZA 18 MG/3ML SOPN, Inject 0.3 mLs (1.8 mg total) into the skin daily.  Current Outpatient Medications (Cardiovascular):  .  furosemide (LASIX) 20 MG tablet, Take 1 tablet (20 mg total) by mouth daily as needed for edema. Marland Kitchen  lisinopril (ZESTRIL) 5 MG tablet, Take 1 tablet (5 mg total) by mouth daily. Marland Kitchen  lovastatin (MEVACOR) 40 MG tablet, Take 1 tablet (40 mg total) by mouth at bedtime. .  rosuvastatin (CRESTOR) 20 MG tablet, Take 1 tablet (20 mg total) by mouth daily.   Current Outpatient Medications (Analgesics):  .  allopurinol (ZYLOPRIM) 300 MG tablet, Take 1 tablet (300 mg total) by mouth daily.  Current Outpatient Medications  (Hematological):  .  folic acid (FOLVITE) 1 MG tablet, Take 1 tablet (1 mg total) by mouth daily.  Current Outpatient Medications (Other):  .  bimatoprost (LUMIGAN) 0.01 % SOLN, Place 1 drop into both eyes at bedtime.  .  COMBIGAN 0.2-0.5 % ophthalmic solution, Place 1 drop into the left eye 2 (two) times a day.  Marland Kitchen  omeprazole (PRILOSEC) 40 MG capsule, Take 1 capsule (40 mg total) by mouth daily. .  SUPER B COMPLEX/C PO, Take 1 tablet by mouth daily.  Past medical history, social, surgical and family history all reviewed in electronic medical record.  No pertanent information unless stated regarding to the chief complaint.   Review of Systems:  No headache, visual changes, nausea, vomiting, diarrhea, constipation, dizziness, abdominal pain, skin rash, fevers, chills, night sweats, weight loss, swollen lymph nodes, body aches, joint swelling, , chest pain, shortness of breath, mood changes.  Positive muscle aches  Objective  Blood pressure 120/62, pulse 69, height 5\' 4"  (1.626 m), weight 184 lb (83.5 kg), SpO2 97 %. f    General: No apparent distress alert and oriented x3 mood and affect normal, dressed appropriately.  HEENT: Pupils equal, extraocular movements intact  Respiratory: Patient's speak in full sentences and does not appear short of breath  Cardiovascular: No lower extremity edema, non tender, no erythema  Skin: Warm dry intact with no signs of infection or rash on extremities or on axial skeleton.  Abdomen: Soft nontender  Neuro: Cranial nerves II through XII are intact, neurovascularly intact in all extremities with 2+ DTRs and 2+ pulses.  Lymph: No lymphadenopathy of posterior or anterior cervical chain or axillae bilaterally.  Gait using the aid of a walker.  MSK: Arthritic changes of multiple joints. Right shoulder exam is some mild arthritic changes.  Patient does have positive impingement.  Mild positive crossover.  Seems to have more pain with the subacromial space  of the acromioclavicular today.  Procedure: Real-time Ultrasound Guided Injection of right glenohumeral joint Device: GE Logiq Q7  Ultrasound guided injection is preferred based studies that show increased duration, increased effect, greater accuracy, decreased procedural pain, increased response rate with ultrasound guided versus blind injection.  Verbal informed consent obtained.  Time-out conducted.  Noted no overlying erythema, induration, or other signs of local infection.  Skin prepped in a sterile fashion.  Local anesthesia: Topical Ethyl chloride.  With sterile technique and under real time ultrasound guidance:  Joint visualized.  23g 1  inch needle inserted posterior approach. Pictures taken for needle placement. Patient did have injection of 2 cc of 1% lidocaine, 2 cc of 0.5% Marcaine, and 1.0 cc of Kenalog 40 mg/dL. Completed without difficulty  Pain immediately resolved suggesting accurate placement of the medication.  Advised to call if fevers/chills, erythema, induration, drainage, or persistent bleeding.  Images permanently stored and available for review in the ultrasound unit.  Impression: Technically successful ultrasound guided injection. Impression and Recommendations:     This case required medical decision making of moderate complexity. The above documentation has been reviewed and is accurate and complete Lyndal Pulley, DO       Note: This dictation was prepared with Dragon dictation along with smaller phrase technology. Any transcriptional errors that result from this process are unintentional.

## 2019-06-03 NOTE — Patient Instructions (Signed)
Be safe Ice 20 minutes 2 times daily  Voltaren gel over the counter See me when you need me

## 2019-06-03 NOTE — Assessment & Plan Note (Signed)
Interventions previously:    Interventions this visit: Injection given June 03, 2019. We discussed with patient the importance ergonomics, home exercises, icing regimen, and over-the-counter natural products.  Discussed with patient that also be a history of the acromioclavicular arthritis that may need injection.   Future considerations: Consider injection of the acromioclavicular joint  Return to clinic: 4 to 6 weeks

## 2019-06-13 DIAGNOSIS — G959 Disease of spinal cord, unspecified: Secondary | ICD-10-CM | POA: Diagnosis not present

## 2019-06-14 ENCOUNTER — Ambulatory Visit: Payer: Medicare Other | Admitting: Podiatry

## 2019-06-16 ENCOUNTER — Encounter: Payer: Medicare Other | Admitting: Physical Medicine & Rehabilitation

## 2019-07-05 ENCOUNTER — Encounter (INDEPENDENT_AMBULATORY_CARE_PROVIDER_SITE_OTHER): Payer: Medicare Other | Admitting: Ophthalmology

## 2019-07-06 ENCOUNTER — Ambulatory Visit: Payer: Medicare Other | Admitting: Podiatry

## 2019-07-12 ENCOUNTER — Telehealth: Payer: Self-pay | Admitting: Family Medicine

## 2019-07-12 NOTE — Telephone Encounter (Signed)
Spoke with Teresa Barnes today. She has some questions regarding her uric acid levels. She would like for you to give her a call back.

## 2019-07-13 ENCOUNTER — Ambulatory Visit (INDEPENDENT_AMBULATORY_CARE_PROVIDER_SITE_OTHER): Payer: Medicare Other | Admitting: Family Medicine

## 2019-07-13 ENCOUNTER — Encounter: Payer: Self-pay | Admitting: Family Medicine

## 2019-07-13 DIAGNOSIS — R600 Localized edema: Secondary | ICD-10-CM

## 2019-07-13 NOTE — Progress Notes (Signed)
TELEPHONE ENCOUNTER   Patient verbally agreed to telephone visit and is aware that copayment and coinsurance may apply. Patient was treated using telemedicine according to accepted telemedicine protocols.  Location of the patient: home Location of provider: [place], [place] office Names of all persons participating in the telemedicine service and role in the encounter: Leamon Arnt, MD Lilli Light, CMA   Subjective  CC:  Chief Complaint  Patient presents with  . Leg Swelling    Reports that the leg swelling has never fully gone away. She completed the RX of Lasix and is still taking Allopurinol     HPI: Teresa Barnes is a 73 y.o. female who was telephoned today to address the problems listed above in the chief complaint.  Treated for LE edema in June s/p cervical spine fusion - did very well. Now with minimal late evening swelling after being up during the day; only around ankles. No swelling in the legs. Weight is stable. No pain, weeping or sob. Doing well. Increasing activity level and [facility]. Feeling well.   ASSESSMENT: 1. Lower extremity edema      Edema, post operative: resolved. Reassured. Elevate legs as needed. Has f/u in September with me. Will get flu shot at that time. Call if swelling worsens; it shouldn't.  Time spent with the patient (non face-to-face time during this virtual encounter): 11 minutes, spent in obtaining information about her symptoms, reviewing her previous labs, evaluations, and treatments, counseling her about her condition (please see the discussed topics above), and developing a plan to further investigate it; the patient was provided an opportunity to ask questions and all were answered. The patient agreed with the plan and demonstrated an understanding of the instructions.   The patient was advised to call back or seek an in-person evaluation if the symptoms worsen or if the condition fails to improve  as anticipated.  Follow up: No follow-ups on file.  07/19/2019  No orders of the defined types were placed in this encounter.  No orders of the defined types were placed in this encounter.    I reviewed the patients updated PMH, FH, and SocHx.    Patient Active Problem List   Diagnosis Date Noted  . Mixed hyperlipidemia 12/09/2017    Priority: High  . Hypertension associated with diabetes ([place]) 12/09/2017    Priority: High  . H/O: upper GI bleed 07/30/2016    Priority: High  . Controlled type 2 diabetes mellitus without complication, without long-term current use of insulin ([place]) 04/03/2009    Priority: High  . Anemia of chronic disease 09/24/2018    Priority: Medium  . History of right breast cancer 12/09/2017    Priority: Medium  . Hiatal hernia 12/09/2017    Priority: Medium  . Esophageal stricture 12/09/2017    Priority: Medium  . Chronic gout 12/09/2017    Priority: Medium  . Glaucoma 12/09/2017    Priority: Medium  . Degenerative arthritis of right knee 07/03/2017    Priority: Medium  . Right lumbar radiculopathy 01/23/2016    Priority: Medium  . Arthritis of right acromioclavicular joint 01/02/2016    Priority: Medium  . Greater trochanteric bursitis of right hip 11/17/2017    Priority: Low  . Normochromic anemia     Priority: Low  . Acute bursitis of right shoulder 06/03/2019  . Cervical myelopathy ([place]) 03/29/2019  . S/P cervical spinal fusion 03/26/2019  . Piriformis syndrome of right side  09/10/2018  . Adhesive bursitis of left shoulder 08/05/2018   No outpatient medications have been marked as taking for the 07/13/19 encounter (Office Visit) with Leamon Arnt, MD.    Allergies: Patient is allergic to prednisone and tramadol. Family History: Patient family history includes Arthritis in her daughter and son; Diabetes in her daughter, mother, sister, and son; Hyperlipidemia in her daughter. Social History:  Patient  reports that she quit smoking  about 26 years ago. Her smoking use included cigarettes. She has a 2.50 pack-year smoking history. She has never used smokeless tobacco. She reports that she does not drink alcohol or use drugs.  Review of Systems: Constitutional: Negative for fever malaise or anorexia Cardiovascular: negative for chest pain Respiratory: negative for SOB or persistent cough Gastrointestinal: negative for abdominal pain  Wt Readings from Last 3 Encounters:  06/03/19 184 lb (83.5 kg)  05/17/19 184 lb 11.9 oz (83.8 kg)  04/21/19 184 lb (83.5 kg)  reports weight is the same from home readings.

## 2019-07-13 NOTE — Telephone Encounter (Signed)
Called pt and scheduled for today

## 2019-07-14 ENCOUNTER — Other Ambulatory Visit: Payer: Self-pay

## 2019-07-14 DIAGNOSIS — G959 Disease of spinal cord, unspecified: Secondary | ICD-10-CM | POA: Diagnosis not present

## 2019-07-14 NOTE — Patient Outreach (Signed)
[facility]) Care Management  07/14/2019  [name] [birth year] [id number]   Medication Adherence call to Mrs. Teresa Barnes  Patient did not answer voice mail is full patient is showing past due on Lisinopril 5 mg under [place].  [facility] Direct Dial [phone]  Fax [phone] [email or web address]

## 2019-07-19 ENCOUNTER — Ambulatory Visit (INDEPENDENT_AMBULATORY_CARE_PROVIDER_SITE_OTHER): Payer: Medicare Other

## 2019-07-19 ENCOUNTER — Other Ambulatory Visit: Payer: Self-pay

## 2019-07-19 DIAGNOSIS — Z0000 Encounter for general adult medical examination without abnormal findings: Secondary | ICD-10-CM | POA: Diagnosis not present

## 2019-07-19 NOTE — Progress Notes (Signed)
Subjective:   Teresa Barnes is a 73 y.o. female who presents for Medicare Annual (Subsequent) preventive examination.  Review of Systems:   Cardiac Risk Factors include: advanced age (>28men, >63 women);dyslipidemia;hypertension;diabetes mellitus     Objective:     Vitals: There were no vitals taken for this visit.  There is no height or weight on file to calculate BMI.  Advanced Directives 07/19/2019 03/29/2019 03/26/2019 03/24/2019 07/15/2018 02/11/2017 01/30/2017  Does Patient Have a Medical Advance Directive? No No No No No No No  Type of Advance Directive - - - - - - -  Does patient want to make changes to medical advance directive? - - - - - - -  Would patient like information on creating a medical advance directive? No - Patient declined No - Patient declined No - Patient declined - No - Patient declined - -    Tobacco Social History   Tobacco Use  Smoking Status Former Smoker  . Packs/day: 0.25  . Years: 10.00  . Pack years: 2.50  . Types: Cigarettes  . Quit date: 06/18/1993  . Years since quitting: 26.1  Smokeless Tobacco Never Used       Clinical Intake:  Pre-visit preparation completed: Yes  Diabetes: Yes CBG done?: No Did pt. bring in CBG monitor from home?: No  How often do you need to have someone help you when you read instructions, pamphlets, or other written materials from your doctor or pharmacy?: 1 - Never  Interpreter Needed?: No  Information entered by :: Denman George LPN  Past Medical History:  Diagnosis Date  . Anemia   . Anemia of chronic disease 09/24/2018  . Anxiety   . Blood transfusion without reported diagnosis 07/2016  . Cancer ([facility]) 1991   breast  . Chronic gout 12/09/2017   On allopurinol  . Diabetes mellitus without complication ([facility])   . Esophageal stricture 12/09/2017   S/p dilation by GI: Dr. Scarlette Shorts; due to h/o severe recurrent erosive esophagitis.  . 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 GERD (gastroesophageal reflux disease)   . Glaucoma   . Hiatal hernia 12/09/2017  . Hyperlipidemia   . Hypertension   . Right rotator cuff tear 11/24/2015   Injected this and 26 2017  . Wears glasses    Past Surgical History:  Procedure Laterality Date  . ANTERIOR CERVICAL DECOMP/DISCECTOMY FUSION N/A 03/26/2019   Procedure: Anterior Cervical Discectomy Fusion - Cervical Three-Cervical Four - Cervical Four-Cervical Five - Cervical Five-Cervical Six;  Surgeon: Eustace Moore, MD;  Location: [facility];  Service: Neurosurgery;  Laterality: N/A;  Anterior Cervical Discectomy Fusion - Cervical Three-Cervical Four - Cervical Four-Cervical Five - Cervical Five-Cervical Six  . CATARACT EXTRACTION W/ INTRAOCULAR LENS  IMPLANT, BILATERAL  2000  . COLONOSCOPY    . cyst on spine    . ESOPHAGOGASTRODUODENOSCOPY (EGD) WITH PROPOFOL N/A 07/31/2016   Procedure: ESOPHAGOGASTRODUODENOSCOPY (EGD) WITH PROPOFOL;  Surgeon: Manus Gunning, MD;  Location: [facility];  Service: Gastroenterology;  Laterality: N/A;  . EYE SURGERY  2011   lt eye blled  . MASTECTOMY MODIFIED RADICAL Right 1990  . REDUCTION MAMMAPLASTY Left   . TRIGGER FINGER RELEASE Right 06/24/2013   Procedure: RELEASE TRIGGER FINGER/A-1 PULLEY PIP RIGHT LONG FINGER;  Surgeon: Cammie Sickle., MD;  Location: [facility];  Service: Orthopedics;  Laterality: Right;  . TRIGGER FINGER RELEASE    . TUBAL LIGATION  Family History  Problem Relation Age of Onset  . Diabetes Mother   . Diabetes Daughter   . Arthritis Daughter   . Hyperlipidemia Daughter   . Diabetes Son   . Arthritis Son   . Diabetes Sister   . Colon cancer Neg Hx   . Cancer Neg Hx   . Heart disease Neg Hx   . Breast cancer Neg Hx    Social History   Socioeconomic History  . Marital status: Married    Spouse name: robert  . Number of children: 2  . Years of education: Not on file  . Highest education level: Not on file  Occupational History  .  Occupation: retired Educational psychologist; [employer] geologist    Comment: 2006  Social Needs  . Financial resource strain: Not on file  . Food insecurity    Worry: Not on file    Inability: Not on file  . Transportation needs    Medical: Not on file    Non-medical: Not on file  Tobacco Use  . Smoking status: Former Smoker    Packs/day: 0.25    Years: 10.00    Pack years: 2.50    Types: Cigarettes    Quit date: 06/18/1993    Years since quitting: 26.1  . Smokeless tobacco: Never Used  Substance and Sexual Activity  . Alcohol use: No  . Drug use: No  . Sexual activity: Never  Lifestyle  . Physical activity    Days per week: Not on file    Minutes per session: Not on file  . Stress: Not on file  Relationships  . Social Herbalist on phone: Not on file    Gets together: Not on file    Attends religious service: Not on file    Active member of club or organization: Not on file    Attends meetings of clubs or organizations: Not on file    Relationship status: Not on file  Other Topics Concern  . Not on file  Social History Narrative   Originally from [place], [place]; worked in [employer] for [employer]; longevity in family    Outpatient Encounter Medications as of 07/19/2019  Medication Sig  . allopurinol (ZYLOPRIM) 300 MG tablet Take 1 tablet (300 mg total) by mouth daily.  . bimatoprost (LUMIGAN) 0.01 % SOLN Place 1 drop into both eyes at bedtime.   . COMBIGAN 0.2-0.5 % ophthalmic solution Place 1 drop into the left eye 2 (two) times a day.   . folic acid (FOLVITE) 1 MG tablet Take 1 tablet (1 mg total) by mouth daily.  Marland Kitchen lisinopril (ZESTRIL) 5 MG tablet Take 1 tablet (5 mg total) by mouth daily.  Marland Kitchen lovastatin (MEVACOR) 40 MG tablet Take 1 tablet (40 mg total) by mouth at bedtime.  . metFORMIN (GLUCOPHAGE) 1000 MG tablet Take 1 tablet (1,000 mg total) by mouth 2 (two) times daily.  Marland Kitchen NOVOFINE PLUS 32G X 4 MM MISC CHECK SUGARS ONCE PER DAY  . omeprazole (PRILOSEC) 40 MG capsule Take 1  capsule (40 mg total) by mouth daily.  . rosuvastatin (CRESTOR) 20 MG tablet Take 1 tablet (20 mg total) by mouth daily.  . SUPER B COMPLEX/C PO Take 1 tablet by mouth daily.   Marland Kitchen VICTOZA 18 MG/3ML SOPN Inject 0.3 mLs (1.8 mg total) into the skin daily.   No facility-administered encounter medications on file as of 07/19/2019.     Activities of Daily Living In your present state of health, do you  have any difficulty performing the following activities: 07/19/2019 03/29/2019  Hearing? N N  Vision? N N  Difficulty concentrating or making decisions? N N  Walking or climbing stairs? Y Y  Dressing or bathing? Y Y  Doing errands, shopping? N N  Preparing Food and eating ? N -  Using the Toilet? N -  In the past six months, have you accidently leaked urine? N -  Do you have problems with loss of bowel control? N -  Managing your Medications? N -  Managing your Finances? N -  Housekeeping or managing your Housekeeping? Y -  Some recent data might be hidden    Patient Care Team: Leamon Arnt, MD as PCP - General (Family Medicine) Lyndal Pulley, DO as Consulting Physician (Family Medicine) Irene Shipper, MD as Consulting Physician (Gastroenterology) Hayden Pedro, MD as Consulting Physician (Ophthalmology) Marzetta Board, DPM as Consulting Physician (Podiatry) Eustace Moore, MD as Consulting Physician (Neurosurgery)    Assessment:   This is a routine wellness examination for Abisola.  Exercise Activities and Dietary recommendations Current Exercise Habits: The patient does not participate in regular exercise at present  Goals    . Weight (lb) < 165 lb (74.8 kg)     Lose weight by watching calorie intake.        Fall Risk Fall Risk  07/19/2019 04/21/2019 07/15/2018 12/09/2017  Falls in the past year? 0 0 No No  Number falls in past yr: 0 - - -  Injury with Fall? 0 - - -  Risk for fall due to : Impaired balance/gait;Impaired mobility - - -  Follow up Education provided -  - -   Is the patient's home free of loose throw rugs in walkways, pet beds, electrical cords, etc?   yes      Grab bars in the bathroom? yes      Handrails on the stairs?   yes      Adequate lighting?   yes  Depression Screen PHQ 2/9 Scores 07/19/2019 04/21/2019 07/15/2018 03/09/2018  PHQ - 2 Score 0 0 0 0  PHQ- 9 Score - - - 0     Cognitive Function- no cognitive concerns at this time  MMSE - Mini Mental State Exam 07/15/2018  Orientation to time 5  Orientation to Place 5  Registration 3  Attention/ Calculation 5  Recall 0  Language- name 2 objects 2  Language- repeat 1  Language- follow 3 step command 3  Language- read & follow direction 1  Write a sentence 1  Copy design 1  Total score 27        Immunization History  Administered Date(s) Administered  . Influenza, High Dose Seasonal PF 08/18/2017, 07/15/2018  . Influenza,inj,Quad PF,6+ Mos 08/01/2016  . Influenza-Unspecified 08/18/2014  . Pneumococcal Conjugate-13 12/09/2017  . Pneumococcal Polysaccharide-23 09/24/2018  . Zoster Recombinat (Shingrix) 10/21/2018, 01/27/2019    Screening Tests Health Maintenance  Topic Date Due  . MAMMOGRAM  02/17/2019  . OPHTHALMOLOGY EXAM  05/19/2019  . INFLUENZA VACCINE  06/19/2019  . HEMOGLOBIN A1C  11/16/2019  . FOOT EXAM  05/16/2020  . DEXA SCAN  02/17/2023  . COLONOSCOPY  06/15/2024  . Hepatitis C Screening  Completed  . PNA vac Low Risk Adult  Completed    Cancer Screenings: Lung: Low Dose CT Chest recommended if Age 2-80 years, 30 pack-year currently smoking OR have quit w/in 15years. Patient does not qualify. Breast:  Up to date on Mammogram? Yes  Up to date of Bone Density/Dexa? Yes Colorectal: completed 06/15/14     Plan:    I have personally reviewed and addressed the Medicare Annual Wellness questionnaire and have noted the following in the patient's chart:  A. Medical and social history B. Use of alcohol, tobacco or illicit drugs  C. Current medications and  supplements D. Functional ability and status E.  Nutritional status F.  Physical activity G. Advance directives H. List of other physicians I.  Hospitalizations, surgeries, and ER visits in previous 12 months J.  [place] such as hearing and vision if needed, cognitive and depression L. Referrals, records requested, and appointments- none   In addition, I have reviewed and discussed with patient certain preventive protocols, quality metrics, and best practice recommendations. A written personalized care plan for preventive services as well as general preventive health recommendations were provided to patient.   Signed,  Denman George, LPN  Nurse Health Advisor   Nurse Notes:  Will send information on in home assistance and prescription assistance for Victoza

## 2019-07-19 NOTE — Progress Notes (Signed)
I have reviewed the documentation from the recent AWV done by Kim Broome; I agree with the documentation and will follow up on any recommendations or abnormal findings as suggested.  

## 2019-07-19 NOTE — Patient Instructions (Signed)
Ms. Schleicher , Thank you for taking time to come for your Medicare Wellness Visit. I appreciate your ongoing commitment to your health goals. Please review the following plan we discussed and let me know if I can assist you in the future.   Screening recommendations/referrals: Colorectal Screening: completed 06/15/14 Mammogram: recommended; we can help with scheduling  Bone Density: up to date; last 02/16/18  I have included information on in home assistance.   I will also reach out to you once I research options to help with cost of Victoza.   Vision and Dental Exams: Recommended annual ophthalmology exams for early detection of glaucoma and other disorders of the eye Recommended annual dental exams for proper oral hygiene  Diabetic Exams: Diabetic Eye Exam: yearly Diabetic Foot Exam: up to date   Vaccinations: Influenza vaccine:  recommended this fall either at PCP office or through your local pharmacy  Pneumococcal vaccine: completed 09/24/18 Tdap vaccine: Please call your insurance company to determine your out of pocket expense. You may also receive this vaccine at your local pharmacy or Health Dept. Shingles vaccine: completed  Advanced directives: Information is available on these documents should you wish to pursue in the future.  We are also available to help in completion of the forms.  Goals: Recommend to remove any items from the home that may cause slips or trips.  Next appointment: Please schedule your Annual Wellness Visit with your Nurse Health Advisor in one year.  Preventive Care 23 Years and Older, Female Preventive care refers to lifestyle choices and visits with your health care provider that can promote health and wellness. What does preventive care include?  A yearly physical exam. This is also called an annual well check.  Dental exams once or twice a year.  Routine eye exams. Ask your health care provider how often you should have your eyes checked.  Personal  lifestyle choices, including:  Daily care of your teeth and gums.  Regular physical activity.  Eating a healthy diet.  Avoiding tobacco and drug use.  Limiting alcohol use.  Practicing safe sex.  Taking low-dose aspirin every day if recommended by your health care provider.  Taking vitamin and mineral supplements as recommended by your health care provider. What happens during an annual well check? The services and screenings done by your health care provider during your annual well check will depend on your age, overall health, lifestyle risk factors, and family history of disease. Counseling  Your health care provider may ask you questions about your:  Alcohol use.  Tobacco use.  Drug use.  Emotional well-being.  Home and relationship well-being.  Sexual activity.  Eating habits.  History of falls.  Memory and ability to understand (cognition).  Work and work Statistician.  Reproductive health. Screening  You may have the following tests or measurements:  Height, weight, and BMI.  Blood pressure.  Lipid and cholesterol levels. These may be checked every 5 years, or more frequently if you are over 80 years old.  Skin check.  Lung cancer screening. You may have this screening every year starting at age 20 if you have a 30-pack-year history of smoking and currently smoke or have quit within the past 15 years.  Fecal occult blood test (FOBT) of the stool. You may have this test every year starting at age 7.  Flexible sigmoidoscopy or colonoscopy. You may have a sigmoidoscopy every 5 years or a colonoscopy every 10 years starting at age 41.  Hepatitis C blood test.  Hepatitis B blood test.  Sexually transmitted disease (STD) testing.  Diabetes screening. This is done by checking your blood sugar (glucose) after you have not eaten for a while (fasting). You may have this done every 1-3 years.  Bone density scan. This is done to screen for osteoporosis.  You may have this done starting at age 43.  Mammogram. This may be done every 1-2 years. Talk to your health care provider about how often you should have regular mammograms. Talk with your health care provider about your test results, treatment options, and if necessary, the need for more tests. Vaccines  Your health care provider may recommend certain vaccines, such as:  Influenza vaccine. This is recommended every year.  Tetanus, diphtheria, and acellular pertussis (Tdap, Td) vaccine. You may need a Td booster every 10 years.  Zoster vaccine. You may need this after age 36.  Pneumococcal 13-valent conjugate (PCV13) vaccine. One dose is recommended after age 87.  Pneumococcal polysaccharide (PPSV23) vaccine. One dose is recommended after age 55. Talk to your health care provider about which screenings and vaccines you need and how often you need them. This information is not intended to replace advice given to you by your health care provider. Make sure you discuss any questions you have with your health care provider. Document Released: 12/01/2015 Document Revised: 07/24/2016 Document Reviewed: 09/05/2015 Elsevier Interactive Patient Education  2017 [place] Prevention in the Home Falls can cause injuries. They can happen to people of all ages. There are many things you can do to make your home safe and to help prevent falls. What can I do on the outside of my home?  Regularly fix the edges of walkways and driveways and fix any cracks.  Remove anything that might make you trip as you walk through a door, such as a raised step or threshold.  Trim any bushes or trees on the path to your home.  Use bright outdoor lighting.  Clear any walking paths of anything that might make someone trip, such as rocks or tools.  Regularly check to see if handrails are loose or broken. Make sure that both sides of any steps have handrails.  Any raised decks and porches should have  guardrails on the edges.  Have any leaves, snow, or ice cleared regularly.  Use sand or salt on walking paths during winter.  Clean up any spills in your garage right away. This includes oil or grease spills. What can I do in the bathroom?  Use night lights.  Install grab bars by the toilet and in the tub and shower. Do not use towel bars as grab bars.  Use non-skid mats or decals in the tub or shower.  If you need to sit down in the shower, use a plastic, non-slip stool.  Keep the floor dry. Clean up any water that spills on the floor as soon as it happens.  Remove soap buildup in the tub or shower regularly.  Attach bath mats securely with double-sided non-slip rug tape.  Do not have throw rugs and other things on the floor that can make you trip. What can I do in the bedroom?  Use night lights.  Make sure that you have a light by your bed that is easy to reach.  Do not use any sheets or blankets that are too big for your bed. They should not hang down onto the floor.  Have a firm chair that has side arms. You can use this  for support while you get dressed.  Do not have throw rugs and other things on the floor that can make you trip. What can I do in the kitchen?  Clean up any spills right away.  Avoid walking on wet floors.  Keep items that you use a lot in easy-to-reach places.  If you need to reach something above you, use a strong step stool that has a grab bar.  Keep electrical cords out of the way.  Do not use floor polish or wax that makes floors slippery. If you must use wax, use non-skid floor wax.  Do not have throw rugs and other things on the floor that can make you trip. What can I do with my stairs?  Do not leave any items on the stairs.  Make sure that there are handrails on both sides of the stairs and use them. Fix handrails that are broken or loose. Make sure that handrails are as long as the stairways.  Check any carpeting to make sure that  it is firmly attached to the stairs. Fix any carpet that is loose or worn.  Avoid having throw rugs at the top or bottom of the stairs. If you do have throw rugs, attach them to the floor with carpet tape.  Make sure that you have a light switch at the top of the stairs and the bottom of the stairs. If you do not have them, ask someone to add them for you. What else can I do to help prevent falls?  Wear shoes that:  Do not have high heels.  Have rubber bottoms.  Are comfortable and fit you well.  Are closed at the toe. Do not wear sandals.  If you use a stepladder:  Make sure that it is fully opened. Do not climb a closed stepladder.  Make sure that both sides of the stepladder are locked into place.  Ask someone to hold it for you, if possible.  Clearly mark and make sure that you can see:  Any grab bars or handrails.  First and last steps.  Where the edge of each step is.  Use tools that help you move around (mobility aids) if they are needed. These include:  Canes.  Walkers.  Scooters.  Crutches.  Turn on the lights when you go into a dark area. Replace any light bulbs as soon as they burn out.  Set up your furniture so you have a clear path. Avoid moving your furniture around.  If any of your floors are uneven, fix them.  If there are any pets around you, be aware of where they are.  Review your medicines with your doctor. Some medicines can make you feel dizzy. This can increase your chance of falling. Ask your doctor what other things that you can do to help prevent falls. This information is not intended to replace advice given to you by your health care provider. Make sure you discuss any questions you have with your health care provider. Document Released: 08/31/2009 Document Revised: 04/11/2016 Document Reviewed: 12/09/2014 Elsevier Interactive Patient Education  2017 [employer].

## 2019-07-21 ENCOUNTER — Other Ambulatory Visit: Payer: Self-pay

## 2019-07-21 NOTE — Patient Outreach (Signed)
[facility]) Care Management  07/21/2019  Teresa Barnes 11-27-1945 [id number]   Medication Adherence call to [name] patient did not answer and voice mail is full patient is showing past due on Lisinopril 5 mg under [place].   [facility] Management Direct Dial [phone]  Fax [phone] [email or web address]

## 2019-07-27 DIAGNOSIS — G959 Disease of spinal cord, unspecified: Secondary | ICD-10-CM | POA: Diagnosis not present

## 2019-08-14 DIAGNOSIS — G959 Disease of spinal cord, unspecified: Secondary | ICD-10-CM | POA: Diagnosis not present

## 2019-08-18 ENCOUNTER — Ambulatory Visit (INDEPENDENT_AMBULATORY_CARE_PROVIDER_SITE_OTHER): Payer: Medicare Other | Admitting: Family Medicine

## 2019-08-18 ENCOUNTER — Encounter: Payer: Self-pay | Admitting: Family Medicine

## 2019-08-18 ENCOUNTER — Other Ambulatory Visit: Payer: Self-pay

## 2019-08-18 VITALS — BP 120/58 | HR 65 | Temp 98.0°F | Resp 16

## 2019-08-18 DIAGNOSIS — Z1231 Encounter for screening mammogram for malignant neoplasm of breast: Secondary | ICD-10-CM

## 2019-08-18 DIAGNOSIS — Z23 Encounter for immunization: Secondary | ICD-10-CM

## 2019-08-18 DIAGNOSIS — E782 Mixed hyperlipidemia: Secondary | ICD-10-CM

## 2019-08-18 DIAGNOSIS — I10 Essential (primary) hypertension: Secondary | ICD-10-CM

## 2019-08-18 DIAGNOSIS — E119 Type 2 diabetes mellitus without complications: Secondary | ICD-10-CM

## 2019-08-18 DIAGNOSIS — M1A9XX0 Chronic gout, unspecified, without tophus (tophi): Secondary | ICD-10-CM

## 2019-08-18 DIAGNOSIS — E1159 Type 2 diabetes mellitus with other circulatory complications: Secondary | ICD-10-CM | POA: Diagnosis not present

## 2019-08-18 DIAGNOSIS — R600 Localized edema: Secondary | ICD-10-CM

## 2019-08-18 DIAGNOSIS — D638 Anemia in other chronic diseases classified elsewhere: Secondary | ICD-10-CM

## 2019-08-18 DIAGNOSIS — M1A00X0 Idiopathic chronic gout, unspecified site, without tophus (tophi): Secondary | ICD-10-CM

## 2019-08-18 LAB — B12 AND FOLATE PANEL
Folate: 12.8 ng/mL (ref >5.9)
Vitamin B-12: 787 pg/mL (ref 211–911)

## 2019-08-18 LAB — CBC WITH DIFFERENTIAL/PLATELET
Basophils Absolute: 0.1 10*3/uL (ref 0.0–0.1)
Basophils Relative: 1.2 % (ref 0.0–3.0)
Eosinophils Absolute: 0.3 10*3/uL (ref 0.0–0.7)
Eosinophils Relative: 4.1 % (ref 0.0–5.0)
HCT: 27.3 % — ABNORMAL LOW (ref 36.0–46.0)
Hemoglobin: 9 g/dL — ABNORMAL LOW (ref 12.0–15.0)
Lymphocytes Relative: 32.9 % (ref 12.0–46.0)
Lymphs Abs: 2.4 10*3/uL (ref 0.7–4.0)
MCHC: 33 g/dL (ref 30.0–36.0)
MCV: 94.8 fl (ref 78.0–100.0)
Monocytes Absolute: 0.6 10*3/uL (ref 0.1–1.0)
Monocytes Relative: 7.7 % (ref 3.0–12.0)
Neutro Abs: 3.9 10*3/uL (ref 1.4–7.7)
Neutrophils Relative %: 54.1 % (ref 43.0–77.0)
Platelets: 348 10*3/uL (ref 150.0–400.0)
RBC: 2.88 Mil/uL — ABNORMAL LOW (ref 3.87–5.11)
RDW: 14.7 % (ref 11.5–15.5)
WBC: 7.2 10*3/uL (ref 4.0–10.5)

## 2019-08-18 LAB — COMPREHENSIVE METABOLIC PANEL
ALT: 6 U/L (ref 0–35)
AST: 11 U/L (ref 0–37)
Albumin: 4 g/dL (ref 3.5–5.2)
Alkaline Phosphatase: 58 U/L (ref 39–117)
BUN: 26 mg/dL — ABNORMAL HIGH (ref 6–23)
CO2: 28 mEq/L (ref 19–32)
Calcium: 9.9 mg/dL (ref 8.4–10.5)
Chloride: 104 mEq/L (ref 96–112)
Creatinine, Ser: 1.62 mg/dL — ABNORMAL HIGH (ref 0.40–1.20)
GFR: 37.48 mL/min — ABNORMAL LOW (ref >60.00)
Glucose, Bld: 118 mg/dL — ABNORMAL HIGH (ref 70–99)
Potassium: 4.2 mEq/L (ref 3.5–5.1)
Sodium: 140 mEq/L (ref 135–145)
Total Bilirubin: 0.4 mg/dL (ref 0.2–1.2)
Total Protein: 6.4 g/dL (ref 6.0–8.3)

## 2019-08-18 LAB — POCT GLYCOSYLATED HEMOGLOBIN (HGB A1C): Hemoglobin A1C: 6.4 % — AB (ref 4.0–5.6)

## 2019-08-18 LAB — LIPID PANEL
Cholesterol: 134 mg/dL (ref 0–200)
HDL: 47.7 mg/dL (ref >39.00)
LDL Cholesterol: 71 mg/dL (ref 0–99)
NonHDL: 86.67
Total CHOL/HDL Ratio: 3
Triglycerides: 78 mg/dL (ref 0.0–149.0)
VLDL: 15.6 mg/dL (ref 0.0–40.0)

## 2019-08-18 LAB — URIC ACID: Uric Acid, Serum: 4.3 mg/dL (ref 2.4–7.0)

## 2019-08-18 MED ORDER — FUROSEMIDE 40 MG PO TABS: 40.0000 mg | ORAL_TABLET | Freq: Every day | ORAL | 5 refills | Status: DC | PRN

## 2019-08-18 MED ORDER — METFORMIN HCL 500 MG PO TABS: 500.0000 mg | ORAL_TABLET | Freq: Two times a day (BID) | ORAL | 3 refills | Status: DC

## 2019-08-18 MED ORDER — POTASSIUM CHLORIDE ER 10 MEQ PO TBCR: 10.0000 meq | EXTENDED_RELEASE_TABLET | Freq: Every day | ORAL | 5 refills | Status: DC | PRN

## 2019-08-18 NOTE — Progress Notes (Signed)
Subjective  CC:  Chief Complaint  Patient presents with  . Diabetes  . Hypertension  . Gout    Denies pain, Reports feet and leg swelling. Furosemide 8m not working reports previously 412mworked better  . Insomnia    Reports she is having to sleep in recliner, wants to have an RX for a hospital bed    HPI: Teresa GAUGERs a [age].o. female who presents to the office today for follow up of diabetes and problems listed above in the chief complaint.   Diabetes follow up: Her diabetic control is reported as Unchanged. But taking met only once a day.  She denies exertional CP or SOB or symptomatic hypoglycemia. She denies foot sores or paresthesias. Refuses to give urine sample. On ace. Due eye exam and scheduled for November.   HTN: controlled w/o lows  LE edema: had improved but now is present again. She thinks this is gout. I educated her. No sob or left CHF sxs. Did well with lasix but stopped back in June.   Poor sleep due to discomfort and neck pain since surgery. Does best if sitting up. Would like a hospital bed.   Anemia: postoperative on top of chronic disease with h/o vit b12 and folate deficiency. Stopped supplements due to "taking too many pills" but willing to restart if needed. Energy is good.   HLD: increased statin to crestor and tolerating well. nonfasting today. No myalgias.  Gout: no recent flares. Adjusting up allopurinol to push down elevated uric acid.   Wt Readings from Last 3 Encounters:  06/03/19 184 lb (83.5 kg)  05/17/19 184 lb 11.9 oz (83.8 kg)  04/21/19 184 lb (83.5 kg)    BP Readings from Last 3 Encounters:  08/18/19 (!) 120/58  06/03/19 120/62  05/17/19 132/64    Assessment  1. Controlled type 2 diabetes mellitus without complication, without long-term current use of insulin ([facility]  2. Hypertension associated with diabetes ([facility]  3. Mixed hyperlipidemia   4. Anemia of chronic disease   5. Idiopathic chronic gout without tophus,  unspecified site   6. Chronic gout without tophus, unspecified cause, unspecified site   7. Encounter for screening mammogram for breast cancer      Plan   Diabetes is currently very well controlled. Decrease met to 500 bid. rec eye exam. HD flu shot today.   HTN and lipids: good control. Ensure with labs today.   Edema: restart lasix with low dose potassium. Educated.   Anemia: recheck levels.   Gout: recheck uric acid on allopurinol.   Ordered mammogram.   Follow up: No follow-ups on file.. Orders Placed This Encounter  Procedures  . MM DIGITAL SCREENING BILATERAL  . CBC with Differential/Platelet  . Lipid panel  . Iron, TIBC and Ferritin Panel  . Vitamin B12  . Uric acid  . Comprehensive metabolic panel  . Microalbumin / creatinine urine ratio  . POCT glycosylated hemoglobin (Hb A1C)   No orders of the defined types were placed in this encounter.     Immunization History  Administered Date(s) Administered  . Influenza, High Dose Seasonal PF 08/18/2017, 07/15/2018  . Influenza,inj,Quad PF,6+ Mos 08/01/2016  . Influenza-Unspecified 08/18/2014  . Pneumococcal Conjugate-13 12/09/2017  . Pneumococcal Polysaccharide-23 09/24/2018  . Zoster Recombinat (Shingrix) 10/21/2018, 01/27/2019    Diabetes Related Lab Review: Lab Results  Component Value Date   HGBA1C 6.7 (A) 05/17/2019   HGBA1C 6.6 (H) 03/24/2019   HGBA1C 6.2 (A) 12/28/2018  Lab Results  Component Value Date   MICROALBUR 8.1 (H) 03/09/2018   Lab Results  Component Value Date   CREATININE 1.17 05/17/2019   BUN 22 05/17/2019   NA 138 05/17/2019   K 4.5 05/17/2019   CL 104 05/17/2019   CO2 25 05/17/2019   Lab Results  Component Value Date   CHOL 180 05/17/2019   CHOL 175 03/09/2018   CHOL 203 (H) 12/09/2017   Lab Results  Component Value Date   HDL 48.70 05/17/2019   HDL 64.30 03/09/2018   HDL 57.60 12/09/2017   Lab Results  Component Value Date   LDLCALC 113 (H) 05/17/2019   LDLCALC  95 03/09/2018   LDLCALC 125 (H) 12/09/2017   Lab Results  Component Value Date   TRIG 90.0 05/17/2019   TRIG 82.0 03/09/2018   TRIG 103.0 12/09/2017   Lab Results  Component Value Date   CHOLHDL 4 05/17/2019   CHOLHDL 3 03/09/2018   CHOLHDL 4 12/09/2017   No results found for: LDLDIRECT The 10-year ASCVD risk score Mikey Bussing DC Jr., et al., 2013) is: 25.6%   Values used to calculate the score:     Age: 28 years     Sex: Female     Is Non-Hispanic African American: Yes     Diabetic: Yes     Tobacco smoker: No     Systolic Blood Pressure: 983 mmHg     Is BP treated: Yes     HDL Cholesterol: 48.7 mg/dL     Total Cholesterol: 180 mg/dL I have reviewed the [place], Fam and Soc history. Patient Active Problem List   Diagnosis Date Noted  . Mixed hyperlipidemia 12/09/2017    Priority: High  . Hypertension associated with diabetes ([place]) 12/09/2017    Priority: High  . H/O: upper GI bleed 07/30/2016    Priority: High    Due to severe erosive esophagitis; 2017; healed with PPI by egd f/u; has [place] and stricture; s/p blood transfusion. Negative for Barretts esophagitis   . Controlled type 2 diabetes mellitus without complication, without long-term current use of insulin ([place]) 04/03/2009    Priority: High    dxd 1990; well controlled    . S/P cervical spinal fusion 03/26/2019    Priority: Medium    03/2019 severe spinal stenosis and cervical myelopathy   . Anemia of chronic disease 09/24/2018    Priority: Medium  . History of right breast cancer 12/09/2017    Priority: Medium    S/p mastectomy 1990   . Hiatal hernia 12/09/2017    Priority: Medium  . Esophageal stricture 12/09/2017    Priority: Medium    S/p dilation by GI: Dr. Scarlette Shorts; due to h/o severe recurrent erosive esophagitis.   . Chronic gout 12/09/2017    Priority: Medium    On allopurinol   . Glaucoma 12/09/2017    Priority: Medium  . Degenerative arthritis of right knee 07/03/2017    Priority: Medium     Injected 07/03/2017' Injected August 05, 2018   . Right lumbar radiculopathy 01/23/2016    Priority: Medium  . Arthritis of right acromioclavicular joint 01/02/2016    Priority: Medium    Injected 01/02/2016 Repeat injection 03/12/2016   . Greater trochanteric bursitis of right hip 11/17/2017    Priority: Low    Injected November 17, 2017 Repeat injection May 05, 2018   . Normochromic anemia     Priority: Low  . Acute bursitis of right shoulder 06/03/2019    Injected  in June 03, 2019   . Adhesive bursitis of left shoulder 08/05/2018    Injected August 05, 2018     Social History: Patient  reports that she quit smoking about 26 years ago. Her smoking use included cigarettes. She has a 2.50 pack-year smoking history. She has never used smokeless tobacco. She reports that she does not drink alcohol or use drugs.  Review of Systems: Ophthalmic: negative for eye pain, loss of vision or double vision Cardiovascular: negative for chest pain Respiratory: negative for SOB or persistent cough Gastrointestinal: negative for abdominal pain Genitourinary: negative for dysuria or gross hematuria MSK: negative for foot lesions Neurologic: negative for weakness or gait disturbance  Objective  Vitals: BP (!) 120/58   Pulse 65   Temp 98 F (36.7 C) (Tympanic)   Resp 16   SpO2 97%  General: well appearing, no acute distress  Psych:  Alert and oriented, normal mood and affect HEENT:  Normocephalic, atraumatic, moist mucous membranes, supple neck  Cardiovascular:  Nl S1 and S2, RRR without murmur, gallop or rub. +2 pitting Bilateral edema Respiratory:  Good breath sounds bilaterally, CTAB with normal effort, no rales Gastrointestinal: normal BS, soft, nontender Skin:  Warm, no rashes Neurologic:   Mental status is normal. normal gait  Diabetic education: ongoing education regarding chronic disease management for diabetes was given today. We continue to reinforce the ABC's of  diabetic management: A1c (<7 or 8 dependent upon patient), tight blood pressure control, and cholesterol management with goal LDL < 100 minimally. We discuss diet strategies, exercise recommendations, medication options and possible side effects. At each visit, we review recommended immunizations and preventive care recommendations for diabetics and stress that good diabetic control can prevent other problems. See below for this patient's data.    Commons side effects, risks, benefits, and alternatives for medications and treatment plan prescribed today were discussed, and the patient expressed understanding of the given instructions. Patient is instructed to call or message via MyChart if he/she has any questions or concerns regarding our treatment plan. No barriers to understanding were identified. We discussed Red Flag symptoms and signs in detail. Patient expressed understanding regarding what to do in case of urgent or emergency type symptoms.   Medication list was reconciled, printed and provided to the patient in AVS. Patient instructions and summary information was reviewed with the patient as documented in the AVS. This note was prepared with assistance of Dragon voice recognition software. Occasional wrong-word or sound-a-like substitutions may have occurred due to the inherent limitations of voice recognition software

## 2019-08-18 NOTE — Patient Instructions (Signed)
Please return in 3 months for diabetes follow up  Please have your eye doctor send me his note when you see him.   Take your metformin twice a day. I have ordered a lower dose or you can cut your pills in half.  Take the lasix with potassium as needed for your leg swelling.   If you have any questions or concerns, please don't hesitate to send me a message via MyChart or call the office at [phone]. Thank you for visiting with [place] today! It's our pleasure caring for you.

## 2019-08-19 ENCOUNTER — Other Ambulatory Visit: Payer: Self-pay

## 2019-08-19 LAB — IRON,TIBC AND FERRITIN PANEL
%SAT: 16 % (calc) (ref 16–45)
Ferritin: 24 ng/mL (ref 16–288)
Iron: 48 ug/dL (ref 45–160)
TIBC: 293 mcg/dL (calc) (ref 250–450)

## 2019-08-19 NOTE — Patient Outreach (Signed)
[facility]) Care Management  08/19/2019  Teresa Barnes 11/29/1945 [id number]   Medication Adherence call to Teresa Barnes [facility] Identifiers Verify spoke with patient she is past due on Metformin 500 mg and Lisinopril 5 mg patient explain on Metformin she is only taking 500 mg 2 times daily (=1000 mg daily) on Lisinopril she explain she has been in the hospital and they provide it her  with this medication and send her home with some medication patient has plenty at this time.[name] is showing past due under [name].   [facility] Management Direct Dial [phone]  Fax [phone] [email or web address]

## 2019-08-23 NOTE — Progress Notes (Signed)
Labs reviewed. Will monitor renal function while back on lasix. Uric acid is better on allopurinol. Chronic anemia persists.  11/17/2019

## 2019-08-28 ENCOUNTER — Other Ambulatory Visit: Payer: Self-pay | Admitting: Family Medicine

## 2019-09-08 ENCOUNTER — Ambulatory Visit: Payer: Medicare Other | Admitting: Podiatry

## 2019-09-08 ENCOUNTER — Encounter: Payer: Self-pay | Admitting: Podiatry

## 2019-09-08 ENCOUNTER — Other Ambulatory Visit: Payer: Self-pay

## 2019-09-08 DIAGNOSIS — E119 Type 2 diabetes mellitus without complications: Secondary | ICD-10-CM

## 2019-09-08 DIAGNOSIS — M79674 Pain in right toe(s): Secondary | ICD-10-CM | POA: Diagnosis not present

## 2019-09-08 DIAGNOSIS — M79675 Pain in left toe(s): Secondary | ICD-10-CM

## 2019-09-08 DIAGNOSIS — B351 Tinea unguium: Secondary | ICD-10-CM | POA: Diagnosis not present

## 2019-09-08 NOTE — Patient Instructions (Signed)
Diabetes Mellitus and Foot Care Foot care is an important part of your health, especially when you have diabetes. Diabetes may cause you to have problems because of poor blood flow (circulation) to your feet and legs, which can cause your skin to:  Become thinner and drier.  Break more easily.  Heal more slowly.  Peel and crack. You may also have nerve damage (neuropathy) in your legs and feet, causing decreased feeling in them. This means that you may not notice minor injuries to your feet that could lead to more serious problems. Noticing and addressing any potential problems early is the best way to prevent future foot problems. How to care for your feet Foot hygiene  Wash your feet daily with warm water and mild soap. Do not use hot water. Then, pat your feet and the areas between your toes until they are completely dry. Do not soak your feet as this can dry your skin.  Trim your toenails straight across. Do not dig under them or around the cuticle. File the edges of your nails with an emery board or nail file.  Apply a moisturizing lotion or petroleum jelly to the skin on your feet and to dry, brittle toenails. Use lotion that does not contain alcohol and is unscented. Do not apply lotion between your toes. Shoes and socks  Wear clean socks or stockings every day. Make sure they are not too tight. Do not wear knee-high stockings since they may decrease blood flow to your legs.  Wear shoes that fit properly and have enough cushioning. Always look in your shoes before you put them on to be sure there are no objects inside.  To break in new shoes, wear them for just a few hours a day. This prevents injuries on your feet. Wounds, scrapes, corns, and calluses  Check your feet daily for blisters, cuts, bruises, sores, and redness. If you cannot see the bottom of your feet, use a mirror or ask someone for help.  Do not cut corns or calluses or try to remove them with medicine.  If you  find a minor scrape, cut, or break in the skin on your feet, keep it and the skin around it clean and dry. You may clean these areas with mild soap and water. Do not clean the area with peroxide, alcohol, or iodine.  If you have a wound, scrape, corn, or callus on your foot, look at it several times a day to make sure it is healing and not infected. Check for: ? Redness, swelling, or pain. ? Fluid or blood. ? Warmth. ? Pus or a bad smell. General instructions  Do not cross your legs. This may decrease blood flow to your feet.  Do not use heating pads or hot water bottles on your feet. They may burn your skin. If you have lost feeling in your feet or legs, you may not know this is happening until it is too late.  Protect your feet from hot and cold by wearing shoes, such as at the beach or on hot pavement.  Schedule a complete foot exam at least once a year (annually) or more often if you have foot problems. If you have foot problems, report any cuts, sores, or bruises to your health care provider immediately. Contact a health care provider if:  You have a medical condition that increases your risk of infection and you have any cuts, sores, or bruises on your feet.  You have an injury that is not   healing.  You have redness on your legs or feet.  You feel burning or tingling in your legs or feet.  You have pain or cramps in your legs and feet.  Your legs or feet are numb.  Your feet always feel cold.  You have pain around a toenail. Get help right away if:  You have a wound, scrape, corn, or callus on your foot and: ? You have pain, swelling, or redness that gets worse. ? You have fluid or blood coming from the wound, scrape, corn, or callus. ? Your wound, scrape, corn, or callus feels warm to the touch. ? You have pus or a bad smell coming from the wound, scrape, corn, or callus. ? You have a fever. ? You have a red line going up your leg. Summary  Check your feet every day  for cuts, sores, red spots, swelling, and blisters.  Moisturize feet and legs daily.  Wear shoes that fit properly and have enough cushioning.  If you have foot problems, report any cuts, sores, or bruises to your health care provider immediately.  Schedule a complete foot exam at least once a year (annually) or more often if you have foot problems. This information is not intended to replace advice given to you by your health care provider. Make sure you discuss any questions you have with your health care provider. Document Released: 11/01/2000 Document Revised: 12/17/2017 Document Reviewed: 12/06/2016 Elsevier Patient Education  2020 Elsevier Inc.  

## 2019-09-12 NOTE — Progress Notes (Signed)
Subjective: Teresa Barnes is seen today for follow up painful, elongated, thickened toenails 1-5 b/l feet that she cannot cut. Pain interferes with daily activities. Aggravating factor includes wearing enclosed shoe gear and relieved with periodic debridement.  Pt states she is recovering from gout episode which was managed by her PCP. Her feet feel better now.  Current Outpatient Medications on File Prior to Visit  Medication Sig  . allopurinol (ZYLOPRIM) 300 MG tablet Take 1 tablet (300 mg total) by mouth daily.  . bimatoprost (LUMIGAN) 0.01 % SOLN Place 1 drop into both eyes at bedtime.   . COMBIGAN 0.2-0.5 % ophthalmic solution Place 1 drop into the left eye 2 (two) times a day.   . folic acid (FOLVITE) 1 MG tablet Take 1 tablet (1 mg total) by mouth daily.  . furosemide (LASIX) 40 MG tablet Take 1 tablet (40 mg total) by mouth daily as needed.  Marland Kitchen lisinopril (ZESTRIL) 5 MG tablet Take 1 tablet by mouth once daily  . metFORMIN (GLUCOPHAGE) 500 MG tablet Take 1 tablet (500 mg total) by mouth 2 (two) times daily.  Marland Kitchen NOVOFINE PLUS 32G X 4 MM MISC CHECK SUGARS ONCE PER DAY  . omeprazole (PRILOSEC) 40 MG capsule Take 1 capsule (40 mg total) by mouth daily.  . potassium chloride (KLOR-CON) 10 MEQ tablet Take 1 tablet (10 mEq total) by mouth daily as needed. Take with lasix  . rosuvastatin (CRESTOR) 20 MG tablet Take 1 tablet (20 mg total) by mouth daily.  . SUPER B COMPLEX/C PO Take 1 tablet by mouth daily.   Marland Kitchen VICTOZA 18 MG/3ML SOPN Inject 0.3 mLs (1.8 mg total) into the skin daily.   No current facility-administered medications on file prior to visit.      Allergies  Allergen Reactions  . Prednisone Nausea And Vomiting  . Tramadol Nausea Only    Objective:  Vascular Examination: Capillary refill time <3 seconds b/l.  Dorsalis pedis and Posterior tibial pulses 1/4 b/l.  Digital hair present b/l.   Skin temperature gradient WNL b/l.   Dermatological Examination: Skin with normal  turgor, texture and tone b/l.  Toenails 1-5 b/l discolored, thick, dystrophic with subungual debris and pain with palpation to nailbeds due to thickness of nails.  Musculoskeletal: Muscle strength 5/5 b/l to all LE muscle groups.  No gross bony deformities b/l.  No pain, crepitus or joint limitation noted with ROM.   Neurological Examination: Protective sensation intact with 10 gram monofilament bilaterally.  Epicritic sensation present bilaterally.  Vibratory sensation diminished b/l.  Assessment: Painful onychomycosis toenails 1-5 b/l  NIDDM  Plan: 1. Toenails 1-5 b/l were debrided in length and girth without iatrogenic bleeding. 2. Patient to continue soft, supportive shoe gear 3. Patient to report any pedal injuries to medical professional immediately. 4. Follow up 3 months.  5. Patient/POA to call should there be a concern in the interim.

## 2019-09-13 DIAGNOSIS — G959 Disease of spinal cord, unspecified: Secondary | ICD-10-CM | POA: Diagnosis not present

## 2019-09-20 ENCOUNTER — Encounter (INDEPENDENT_AMBULATORY_CARE_PROVIDER_SITE_OTHER): Payer: Medicare Other | Admitting: Ophthalmology

## 2019-10-04 ENCOUNTER — Ambulatory Visit: Payer: Medicare Other

## 2019-10-04 DIAGNOSIS — H40003 Preglaucoma, unspecified, bilateral: Secondary | ICD-10-CM | POA: Diagnosis not present

## 2019-10-04 DIAGNOSIS — E113593 Type 2 diabetes mellitus with proliferative diabetic retinopathy without macular edema, bilateral: Secondary | ICD-10-CM | POA: Diagnosis not present

## 2019-10-05 ENCOUNTER — Other Ambulatory Visit: Payer: Self-pay

## 2019-10-05 ENCOUNTER — Ambulatory Visit
Admission: RE | Admit: 2019-10-05 | Discharge: 2019-10-05 | Disposition: A | Discharge status: Home / Self Care | Payer: Medicare Other | Source: Ambulatory Visit | Attending: Family Medicine | Admitting: Family Medicine

## 2019-10-05 DIAGNOSIS — Z1231 Encounter for screening mammogram for malignant neoplasm of breast: Secondary | ICD-10-CM

## 2019-10-07 ENCOUNTER — Other Ambulatory Visit: Payer: Self-pay | Admitting: Family Medicine

## 2019-10-07 DIAGNOSIS — R928 Other abnormal and inconclusive findings on diagnostic imaging of breast: Secondary | ICD-10-CM

## 2019-10-11 ENCOUNTER — Other Ambulatory Visit: Payer: Self-pay

## 2019-10-11 ENCOUNTER — Ambulatory Visit: Payer: Medicare Other

## 2019-10-11 ENCOUNTER — Ambulatory Visit
Admission: RE | Admit: 2019-10-11 | Discharge: 2019-10-11 | Disposition: A | Discharge status: Home / Self Care | Payer: Medicare Other | Source: Ambulatory Visit | Attending: Family Medicine | Admitting: Family Medicine

## 2019-10-11 DIAGNOSIS — R928 Other abnormal and inconclusive findings on diagnostic imaging of breast: Secondary | ICD-10-CM | POA: Diagnosis not present

## 2019-10-14 DIAGNOSIS — G959 Disease of spinal cord, unspecified: Secondary | ICD-10-CM | POA: Diagnosis not present

## 2019-10-17 ENCOUNTER — Other Ambulatory Visit: Payer: Self-pay | Admitting: Family Medicine

## 2019-10-21 ENCOUNTER — Encounter (INDEPENDENT_AMBULATORY_CARE_PROVIDER_SITE_OTHER): Payer: Medicare Other | Admitting: Ophthalmology

## 2019-10-26 DIAGNOSIS — G959 Disease of spinal cord, unspecified: Secondary | ICD-10-CM | POA: Diagnosis not present

## 2019-10-28 DIAGNOSIS — M5136 Other intervertebral disc degeneration, lumbar region: Secondary | ICD-10-CM | POA: Diagnosis not present

## 2019-10-28 DIAGNOSIS — R262 Difficulty in walking, not elsewhere classified: Secondary | ICD-10-CM | POA: Diagnosis not present

## 2019-10-28 DIAGNOSIS — M5033 Other cervical disc degeneration, cervicothoracic region: Secondary | ICD-10-CM | POA: Diagnosis not present

## 2019-10-28 DIAGNOSIS — M6281 Muscle weakness (generalized): Secondary | ICD-10-CM | POA: Diagnosis not present

## 2019-10-28 DIAGNOSIS — R2681 Unsteadiness on feet: Secondary | ICD-10-CM | POA: Diagnosis not present

## 2019-11-13 DIAGNOSIS — G959 Disease of spinal cord, unspecified: Secondary | ICD-10-CM | POA: Diagnosis not present

## 2019-11-16 ENCOUNTER — Telehealth: Payer: Self-pay | Admitting: Family Medicine

## 2019-11-16 NOTE — Telephone Encounter (Signed)
Pt called wanting to reschedule appt that was on 12/30 to the middle of January. Dr. Jonni Sanger is booked until March. Could pt get scheduled on 1/12 in a virtual & same day slot since appt needs to be 40 min? Please advise.

## 2019-11-16 NOTE — Telephone Encounter (Signed)
Please advise 

## 2019-11-16 NOTE — Telephone Encounter (Signed)
May reschedule for January in a 20 minute slot for diabetes and HTN f/u. Thanks

## 2019-11-17 ENCOUNTER — Ambulatory Visit: Payer: Medicare Other | Admitting: Family Medicine

## 2019-11-17 NOTE — Telephone Encounter (Signed)
Called pt and scheduled appt for January

## 2019-11-22 ENCOUNTER — Encounter (INDEPENDENT_AMBULATORY_CARE_PROVIDER_SITE_OTHER): Payer: Medicare Other | Admitting: Ophthalmology

## 2019-11-25 ENCOUNTER — Other Ambulatory Visit: Payer: Self-pay | Admitting: Family Medicine

## 2019-12-03 ENCOUNTER — Ambulatory Visit: Payer: Medicare Other | Admitting: Family Medicine

## 2019-12-13 ENCOUNTER — Encounter: Payer: Self-pay | Admitting: Podiatry

## 2019-12-13 ENCOUNTER — Ambulatory Visit: Payer: Medicare Other | Admitting: Podiatry

## 2019-12-13 ENCOUNTER — Other Ambulatory Visit: Payer: Self-pay

## 2019-12-13 DIAGNOSIS — M79674 Pain in right toe(s): Secondary | ICD-10-CM

## 2019-12-13 DIAGNOSIS — B351 Tinea unguium: Secondary | ICD-10-CM | POA: Diagnosis not present

## 2019-12-13 DIAGNOSIS — M79675 Pain in left toe(s): Secondary | ICD-10-CM

## 2019-12-13 DIAGNOSIS — E119 Type 2 diabetes mellitus without complications: Secondary | ICD-10-CM | POA: Diagnosis not present

## 2019-12-13 NOTE — Patient Instructions (Signed)
Diabetes Mellitus and Foot Care Foot care is an important part of your health, especially when you have diabetes. Diabetes may cause you to have problems because of poor blood flow (circulation) to your feet and legs, which can cause your skin to:  Become thinner and drier.  Break more easily.  Heal more slowly.  Peel and crack. You may also have nerve damage (neuropathy) in your legs and feet, causing decreased feeling in them. This means that you may not notice minor injuries to your feet that could lead to more serious problems. Noticing and addressing any potential problems early is the best way to prevent future foot problems. How to care for your feet Foot hygiene  Wash your feet daily with warm water and mild soap. Do not use hot water. Then, pat your feet and the areas between your toes until they are completely dry. Do not soak your feet as this can dry your skin.  Trim your toenails straight across. Do not dig under them or around the cuticle. File the edges of your nails with an emery board or nail file.  Apply a moisturizing lotion or petroleum jelly to the skin on your feet and to dry, brittle toenails. Use lotion that does not contain alcohol and is unscented. Do not apply lotion between your toes. Shoes and socks  Wear clean socks or stockings every day. Make sure they are not too tight. Do not wear knee-high stockings since they may decrease blood flow to your legs.  Wear shoes that fit properly and have enough cushioning. Always look in your shoes before you put them on to be sure there are no objects inside.  To break in new shoes, wear them for just a few hours a day. This prevents injuries on your feet. Wounds, scrapes, corns, and calluses  Check your feet daily for blisters, cuts, bruises, sores, and redness. If you cannot see the bottom of your feet, use a mirror or ask someone for help.  Do not cut corns or calluses or try to remove them with medicine.  If you  find a minor scrape, cut, or break in the skin on your feet, keep it and the skin around it clean and dry. You may clean these areas with mild soap and water. Do not clean the area with peroxide, alcohol, or iodine.  If you have a wound, scrape, corn, or callus on your foot, look at it several times a day to make sure it is healing and not infected. Check for: ? Redness, swelling, or pain. ? Fluid or blood. ? Warmth. ? Pus or a bad smell. General instructions  Do not cross your legs. This may decrease blood flow to your feet.  Do not use heating pads or hot water bottles on your feet. They may burn your skin. If you have lost feeling in your feet or legs, you may not know this is happening until it is too late.  Protect your feet from hot and cold by wearing shoes, such as at the beach or on hot pavement.  Schedule a complete foot exam at least once a year (annually) or more often if you have foot problems. If you have foot problems, report any cuts, sores, or bruises to your health care provider immediately. Contact a health care provider if:  You have a medical condition that increases your risk of infection and you have any cuts, sores, or bruises on your feet.  You have an injury that is not   healing.  You have redness on your legs or feet.  You feel burning or tingling in your legs or feet.  You have pain or cramps in your legs and feet.  Your legs or feet are numb.  Your feet always feel cold.  You have pain around a toenail. Get help right away if:  You have a wound, scrape, corn, or callus on your foot and: ? You have pain, swelling, or redness that gets worse. ? You have fluid or blood coming from the wound, scrape, corn, or callus. ? Your wound, scrape, corn, or callus feels warm to the touch. ? You have pus or a bad smell coming from the wound, scrape, corn, or callus. ? You have a fever. ? You have a red line going up your leg. Summary  Check your feet every day  for cuts, sores, red spots, swelling, and blisters.  Moisturize feet and legs daily.  Wear shoes that fit properly and have enough cushioning.  If you have foot problems, report any cuts, sores, or bruises to your health care provider immediately.  Schedule a complete foot exam at least once a year (annually) or more often if you have foot problems. This information is not intended to replace advice given to you by your health care provider. Make sure you discuss any questions you have with your health care provider. Document Revised: 07/28/2019 Document Reviewed: 12/06/2016 Elsevier Patient Education  2020 Elsevier Inc.  

## 2019-12-14 DIAGNOSIS — G959 Disease of spinal cord, unspecified: Secondary | ICD-10-CM | POA: Diagnosis not present

## 2019-12-16 ENCOUNTER — Ambulatory Visit: Payer: Medicare Other | Admitting: Neurology

## 2019-12-18 NOTE — Progress Notes (Signed)
Subjective: Teresa Barnes presents today for follow up of preventative diabetic foot care with painful mycotic nails b/l that are difficult to trim. Pain interferes with ambulation. Aggravating factors include wearing enclosed shoe gear. Pain is relieved with periodic professional debridement.   Allergies  Allergen Reactions  . Prednisone Nausea And Vomiting  . Tramadol Nausea Only     Objective: There were no vitals filed for this visit.  Vascular Examination:  Capillary fill time to digits <3s b/l, faintly palpable DP pulses b/l, faintly palpable PT pulses b/l, pedal hair present b/l and skin temperature gradient within normal limits b/l  Dermatological Examination: Pedal skin with normal turgor, texture and tone bilaterally, no open wounds bilaterally, no interdigital macerations bilaterally and toenails 1-5 b/l elongated, dystrophic, thickened, crumbly with subungual debris  Musculoskeletal: Normal muscle strength 5/5 to all lower extremity muscle groups bilaterally, no gross bony deformities bilaterally and no pain crepitus or joint limitation noted with ROM b/l  Neurological: Protective sensation intact 5/5 intact bilaterally with 10g monofilament b/l and vibratory sensation intact b/l  Assessment: 1. Pain due to onychomycosis of toenails of both feet   2. Diabetes mellitus without complication ([place])     Plan: -Continue diabetic foot care principles. Literature dispensed on today.  -Toenails 1-5 b/l were debrided in length and girth without iatrogenic bleeding. -Patient to continue soft, supportive shoe gear daily. -Patient to report any pedal injuries to medical professional immediately. -Patient/POA to call should there be question/concern in the interim.  Return in about 3 months (around 03/12/2020) for diabetic nail trim.

## 2020-01-14 DIAGNOSIS — G959 Disease of spinal cord, unspecified: Secondary | ICD-10-CM | POA: Diagnosis not present

## 2020-01-26 ENCOUNTER — Encounter (INDEPENDENT_AMBULATORY_CARE_PROVIDER_SITE_OTHER): Payer: Medicare Other | Admitting: Ophthalmology

## 2020-01-28 ENCOUNTER — Ambulatory Visit (INDEPENDENT_AMBULATORY_CARE_PROVIDER_SITE_OTHER): Payer: Medicare Other | Admitting: Family Medicine

## 2020-01-28 ENCOUNTER — Encounter: Payer: Self-pay | Admitting: Family Medicine

## 2020-01-28 VITALS — BP 128/60 | Ht 64.0 in | Wt 168.0 lb

## 2020-01-28 DIAGNOSIS — I10 Essential (primary) hypertension: Secondary | ICD-10-CM | POA: Diagnosis not present

## 2020-01-28 DIAGNOSIS — Z981 Arthrodesis status: Secondary | ICD-10-CM

## 2020-01-28 DIAGNOSIS — E782 Mixed hyperlipidemia: Secondary | ICD-10-CM

## 2020-01-28 DIAGNOSIS — E1159 Type 2 diabetes mellitus with other circulatory complications: Secondary | ICD-10-CM

## 2020-01-28 DIAGNOSIS — I152 Hypertension secondary to endocrine disorders: Secondary | ICD-10-CM

## 2020-01-28 DIAGNOSIS — E119 Type 2 diabetes mellitus without complications: Secondary | ICD-10-CM | POA: Diagnosis not present

## 2020-01-28 NOTE — Progress Notes (Signed)
Virtual Visit via Video Note  Subjective  CC:  Chief Complaint  Patient presents with  . Diabetes    Check dm once daily. readings are good per patient  . Hypertension    thinks lisinopril 5mg  is too much. bottom reading low. No lightheadness or dizziness  . Hyperlipidemia    takes rosuvastatin daily. causes joint pain     I connected with Teresa Barnes on 01/28/20 at  8:30 AM EST by a video enabled telemedicine application and verified that I am speaking with the correct person using two identifiers. Location patient: Home Location provider: [facility] participating in the virtual visit: KARIS RILLING, Leamon Arnt, MD Serita Sheller, [place] discussed the limitations of evaluation and management by telemedicine and the availability of in person appointments. The patient expressed understanding and agreed to proceed. HPI: Teresa Barnes is a 74 y.o. female who was contacted today to address the problems listed above in the chief complaint, f/u diabetes:  Diabetes follow up: Her diabetic control is reported as Unchanged. Reports fastings are avd 120-130. She denies exertional CP or SOB or symptomatic hypoglycemia. She denies foot sores or paresthesias. On low dose metformin. Eats well and reports weight is down a few pounds. Eye exam and imms are up to date. Had 1st covid vaccine last month.  HTN: doing well on lisinopril 5mg . Feels fine /wo palpitations or lightheadedness. Reports diastolics run 24P/YKDXIPJAS 505-397. No cp  HLD: we changed from lipitor to crestor in June. In septebmer her control was good. Reports increase in aches.   S/p cervical spine surgery last year and recovering well. Starting to walk unassisted again. NS is pleased with progress.   Immunization History  Administered Date(s) Administered  . Fluad Quad(high Dose 65+) 08/18/2019  . Influenza, High Dose Seasonal PF 08/18/2017, 07/15/2018  . Influenza,inj,Quad PF,6+  Mos 08/01/2016  . Influenza-Unspecified 08/18/2014  . Moderna SARS-COVID-2 Vaccination 01/01/2020  . Pneumococcal Conjugate-13 12/09/2017  . Pneumococcal Polysaccharide-23 09/24/2018  . Zoster Recombinat (Shingrix) 10/21/2018, 01/27/2019    Diabetes Related Lab Review: Lab Results  Component Value Date   HGBA1C 6.4 (A) 08/18/2019   HGBA1C 6.7 (A) 05/17/2019   HGBA1C 6.6 (H) 03/24/2019    Lab Results  Component Value Date   MICROALBUR 8.1 (H) 03/09/2018   Lab Results  Component Value Date   CREATININE 1.62 (H) 08/18/2019   BUN 26 (H) 08/18/2019   NA 140 08/18/2019   K 4.2 08/18/2019   CL 104 08/18/2019   CO2 28 08/18/2019   Lab Results  Component Value Date   CHOL 134 08/18/2019   CHOL 180 05/17/2019   CHOL 175 03/09/2018   Lab Results  Component Value Date   HDL 47.70 08/18/2019   HDL 48.70 05/17/2019   HDL 64.30 03/09/2018   Lab Results  Component Value Date   LDLCALC 71 08/18/2019   LDLCALC 113 (H) 05/17/2019   [place] 95 03/09/2018   Lab Results  Component Value Date   TRIG 78.0 08/18/2019   TRIG 90.0 05/17/2019   TRIG 82.0 03/09/2018   Lab Results  Component Value Date   CHOLHDL 3 08/18/2019   CHOLHDL 4 05/17/2019   CHOLHDL 3 03/09/2018   No results found for: LDLDIRECT The 10-year ASCVD risk score Mikey Bussing DC Jr., et al., 2013) is: 22.8%   Values used to calculate the score:     Age: 6 years     Sex:  Female     Is Non-Hispanic African American: Yes     Diabetic: Yes     Tobacco smoker: No     Systolic Blood Pressure: 637 mmHg     Is BP treated: Yes     HDL Cholesterol: 47.7 mg/dL     Total Cholesterol: 134 mg/dL  BP Readings from Last 3 Encounters:  01/28/20 128/60  08/18/19 (!) 120/58  06/03/19 120/62   Wt Readings from Last 3 Encounters:  01/28/20 168 lb (76.2 kg)  06/03/19 184 lb (83.5 kg)  05/17/19 184 lb 11.9 oz (83.8 kg)    Health Maintenance  Topic Date Due  . HEMOGLOBIN A1C  02/15/2020  . FOOT EXAM  05/16/2020  .  OPHTHALMOLOGY EXAM  09/16/2020  . MAMMOGRAM  10/04/2020  . DEXA SCAN  02/17/2023  . COLONOSCOPY  06/15/2024  . INFLUENZA VACCINE  Completed  . Hepatitis C Screening  Completed  . PNA vac Low Risk Adult  Completed    Assessment  No diagnosis found.   Plan   Diabetes is currently well controlled. By pt report. Will check labs when she is able to safely return to office in 3 months. No changes today  HTN: reassured. Asymptomatic. Will follow bp. No change in lisinopril dosing now.   H/o edema: need to repeat potassium and renal check when she returns.   HLD:add coQ10 to see if can tolerate the crestor 20. Education given. Well controlled.   Recovering from spinal surgery well.   Diabetic education: ongoing education regarding chronic disease management for diabetes was given today. We continue to reinforce the ABC's of diabetic management: A1c (<7 or 8 dependent upon patient), tight blood pressure control, and cholesterol management with goal LDL < 100 minimally. We discuss diet strategies, exercise recommendations, medication options and possible side effects. At each visit, we review recommended immunizations and preventive care recommendations for diabetics and stress that good diabetic control can prevent other problems. See below for this patient's data.  I discussed the assessment and treatment plan with the patient. The patient was provided an opportunity to ask questions and all were answered. The patient agreed with the plan and demonstrated an understanding of the instructions.   The patient was advised to call back or seek an in-person evaluation if the symptoms worsen or if the condition fails to improve as anticipated. Follow up: No follow-ups on file.  02/23/2020  No orders of the defined types were placed in this encounter.     I reviewed the patients updated PMH, FH, and SocHx.    Patient Active Problem List   Diagnosis Date Noted  . Mixed hyperlipidemia 12/09/2017      Priority: High  . Hypertension associated with diabetes ([place]) 12/09/2017    Priority: High  . H/O: upper GI bleed 07/30/2016    Priority: High  . Controlled type 2 diabetes mellitus without complication, without long-term current use of insulin ([place]) 04/03/2009    Priority: High  . S/P cervical spinal fusion 03/26/2019    Priority: Medium  . Anemia of chronic disease 09/24/2018    Priority: Medium  . History of right breast cancer 12/09/2017    Priority: Medium  . Hiatal hernia 12/09/2017    Priority: Medium  . Esophageal stricture 12/09/2017    Priority: Medium  . Chronic gout 12/09/2017    Priority: Medium  . Glaucoma 12/09/2017    Priority: Medium  . Degenerative arthritis of right knee 07/03/2017    Priority: Medium  .  Right lumbar radiculopathy 01/23/2016    Priority: Medium  . Arthritis of right acromioclavicular joint 01/02/2016    Priority: Medium  . Greater trochanteric bursitis of right hip 11/17/2017    Priority: Low  . Normochromic anemia     Priority: Low  . Acute bursitis of right shoulder 06/03/2019  . Adhesive bursitis of left shoulder 08/05/2018   Current Meds  Medication Sig  . allopurinol (ZYLOPRIM) 300 MG tablet Take 1 tablet (300 mg total) by mouth daily.  . bimatoprost (LUMIGAN) 0.01 % SOLN Place 1 drop into both eyes at bedtime.   . COMBIGAN 0.2-0.5 % ophthalmic solution Place 1 drop into the left eye 2 (two) times a day.   . folic acid (FOLVITE) 1 MG tablet Take 1 tablet (1 mg total) by mouth daily.  Marland Kitchen lisinopril (ZESTRIL) 5 MG tablet Take 1 tablet by mouth once daily  . metFORMIN (GLUCOPHAGE) 500 MG tablet Take 1 tablet (500 mg total) by mouth 2 (two) times daily.  Marland Kitchen NOVOFINE PLUS 32G X 4 MM MISC CHECK SUGARS ONCE PER DAY  . omeprazole (PRILOSEC) 40 MG capsule Take 1 capsule (40 mg total) by mouth daily.  . rosuvastatin (CRESTOR) 20 MG tablet Take 1 tablet (20 mg total) by mouth daily.  Marland Kitchen VICTOZA 18 MG/3ML SOPN INJECT 1.8 MG INTO THE SKIN  DAILY  . vitamin B-12 (CYANOCOBALAMIN) 500 MCG tablet Take 500 mcg by mouth daily.    Allergies: Patient is allergic to prednisone and tramadol. Family History: Patient family history includes Arthritis in her daughter and son; Diabetes in her daughter, mother, sister, and son; Hyperlipidemia in her daughter. Social History:  Patient  reports that she quit smoking about 26 years ago. Her smoking use included cigarettes. She has a 2.50 pack-year smoking history. She has never used smokeless tobacco. She reports that she does not drink alcohol or use drugs.  Review of Systems: Constitutional: Negative for fever malaise or anorexia Cardiovascular: negative for chest pain Respiratory: negative for SOB or persistent cough Gastrointestinal: negative for abdominal pain  OBJECTIVE Vitals: BP 128/60   Ht 5\' 4"  (1.626 m)   Wt 168 lb (76.2 kg)   BMI 28.84 kg/m  General: no acute distress , A&Ox3  Leamon Arnt, MD

## 2020-02-23 ENCOUNTER — Ambulatory Visit: Payer: Medicare Other | Admitting: Family Medicine

## 2020-03-13 ENCOUNTER — Encounter: Payer: Self-pay | Admitting: Podiatry

## 2020-03-13 ENCOUNTER — Other Ambulatory Visit: Payer: Self-pay

## 2020-03-13 ENCOUNTER — Ambulatory Visit: Payer: Medicare Other | Admitting: Podiatry

## 2020-03-13 VITALS — Temp 96.7°F

## 2020-03-13 DIAGNOSIS — M79675 Pain in left toe(s): Secondary | ICD-10-CM | POA: Diagnosis not present

## 2020-03-13 DIAGNOSIS — E119 Type 2 diabetes mellitus without complications: Secondary | ICD-10-CM

## 2020-03-13 DIAGNOSIS — M79674 Pain in right toe(s): Secondary | ICD-10-CM | POA: Diagnosis not present

## 2020-03-13 DIAGNOSIS — B351 Tinea unguium: Secondary | ICD-10-CM

## 2020-03-13 NOTE — Patient Instructions (Signed)
Diabetes Mellitus and Foot Care Foot care is an important part of your health, especially when you have diabetes. Diabetes may cause you to have problems because of poor blood flow (circulation) to your feet and legs, which can cause your skin to:  Become thinner and drier.  Break more easily.  Heal more slowly.  Peel and crack. You may also have nerve damage (neuropathy) in your legs and feet, causing decreased feeling in them. This means that you may not notice minor injuries to your feet that could lead to more serious problems. Noticing and addressing any potential problems early is the best way to prevent future foot problems. How to care for your feet Foot hygiene  Wash your feet daily with warm water and mild soap. Do not use hot water. Then, pat your feet and the areas between your toes until they are completely dry. Do not soak your feet as this can dry your skin.  Trim your toenails straight across. Do not dig under them or around the cuticle. File the edges of your nails with an emery board or nail file.  Apply a moisturizing lotion or petroleum jelly to the skin on your feet and to dry, brittle toenails. Use lotion that does not contain alcohol and is unscented. Do not apply lotion between your toes. Shoes and socks  Wear clean socks or stockings every day. Make sure they are not too tight. Do not wear knee-high stockings since they may decrease blood flow to your legs.  Wear shoes that fit properly and have enough cushioning. Always look in your shoes before you put them on to be sure there are no objects inside.  To break in new shoes, wear them for just a few hours a day. This prevents injuries on your feet. Wounds, scrapes, corns, and calluses  Check your feet daily for blisters, cuts, bruises, sores, and redness. If you cannot see the bottom of your feet, use a mirror or ask someone for help.  Do not cut corns or calluses or try to remove them with medicine.  If you  find a minor scrape, cut, or break in the skin on your feet, keep it and the skin around it clean and dry. You may clean these areas with mild soap and water. Do not clean the area with peroxide, alcohol, or iodine.  If you have a wound, scrape, corn, or callus on your foot, look at it several times a day to make sure it is healing and not infected. Check for: ? Redness, swelling, or pain. ? Fluid or blood. ? Warmth. ? Pus or a bad smell. General instructions  Do not cross your legs. This may decrease blood flow to your feet.  Do not use heating pads or hot water bottles on your feet. They may burn your skin. If you have lost feeling in your feet or legs, you may not know this is happening until it is too late.  Protect your feet from hot and cold by wearing shoes, such as at the beach or on hot pavement.  Schedule a complete foot exam at least once a year (annually) or more often if you have foot problems. If you have foot problems, report any cuts, sores, or bruises to your health care provider immediately. Contact a health care provider if:  You have a medical condition that increases your risk of infection and you have any cuts, sores, or bruises on your feet.  You have an injury that is not   healing.  You have redness on your legs or feet.  You feel burning or tingling in your legs or feet.  You have pain or cramps in your legs and feet.  Your legs or feet are numb.  Your feet always feel cold.  You have pain around a toenail. Get help right away if:  You have a wound, scrape, corn, or callus on your foot and: ? You have pain, swelling, or redness that gets worse. ? You have fluid or blood coming from the wound, scrape, corn, or callus. ? Your wound, scrape, corn, or callus feels warm to the touch. ? You have pus or a bad smell coming from the wound, scrape, corn, or callus. ? You have a fever. ? You have a red line going up your leg. Summary  Check your feet every day  for cuts, sores, red spots, swelling, and blisters.  Moisturize feet and legs daily.  Wear shoes that fit properly and have enough cushioning.  If you have foot problems, report any cuts, sores, or bruises to your health care provider immediately.  Schedule a complete foot exam at least once a year (annually) or more often if you have foot problems. This information is not intended to replace advice given to you by your health care provider. Make sure you discuss any questions you have with your health care provider. Document Revised: 07/28/2019 Document Reviewed: 12/06/2016 Elsevier Patient Education  2020 Elsevier Inc.  

## 2020-03-17 NOTE — Progress Notes (Signed)
Subjective: Teresa Barnes presents today for follow up of preventative diabetic foot care and painful mycotic nails b/l that are difficult to trim. Pain interferes with ambulation. Aggravating factors include wearing enclosed shoe gear. Pain is relieved with periodic professional debridement.   She voices no new pedal problems on today's visit.  Allergies  Allergen Reactions  . Prednisone Nausea And Vomiting  . Tramadol Nausea Only     Objective: Vitals:   03/13/20 1450  Temp: (!) 96.7 F (35.9 C)    Pt is a pleasant 74 y.o. year old AA female  in NAD. [name] x 3.   Vascular Examination:  Capillary fill time to digits <3 seconds b/l. Faintly palpable pedal pulses b/l. Pedal hair sparse b/l. Skin temperature gradient within normal limits b/l. No edema noted b/l.  Dermatological Examination: Pedal skin with normal turgor, texture and tone bilaterally. No open wounds bilaterally. No interdigital macerations bilaterally. Toenails 1-5 b/l elongated, dystrophic, thickened, crumbly with subungual debris and tenderness to dorsal palpation.  Musculoskeletal: Normal muscle strength 5/5 to all lower extremity muscle groups bilaterally. No gross bony deformities bilaterally. No pain crepitus or joint limitation noted with ROM b/l.  Neurological: Protective sensation intact 5/5 intact bilaterally with 10g monofilament b/l. Vibratory sensation intact b/l. Proprioception intact bilaterally. Clonus negative b/l.  Assessment: 1. Pain due to onychomycosis of toenails of both feet   2. Diabetes mellitus without complication ([place])    Plan: -Continue diabetic foot care principles. Literature dispensed on today.  -Toenails 1-5 b/l were debrided in length and girth with sterile nail nippers and dremel without iatrogenic bleeding.  -Patient to continue soft, supportive shoe gear daily. -Patient to report any pedal injuries to medical professional immediately. -Patient/POA to call should there be  question/concern in the interim.  Return in about 3 months (around 06/12/2020).

## 2020-04-05 ENCOUNTER — Encounter (INDEPENDENT_AMBULATORY_CARE_PROVIDER_SITE_OTHER): Payer: Medicare Other | Admitting: Ophthalmology

## 2020-04-06 ENCOUNTER — Other Ambulatory Visit: Payer: Self-pay | Admitting: Pharmacist

## 2020-04-08 NOTE — Patient Outreach (Signed)
[facility]) [facility]    04/08/2020  Teresa Barnes 09/05/1946 [id number]  Reason for referral: medication assistance  Referral source: self Referral medication(s): Victoza  Current insurance:[employer]  PMHx:  Patient is a 74 year old female with multiple medical conditions including but not limited to:  Gout, type 2 diabetes, DJD of right knee, glaucoma, hypertension, hyperlipidemia, type 2 diabetes and chronic back pain. Patient was called regarding medication adherence/SUPD measure with rosuvastatin.  HIPAA identifiers were obtained.  HPI:  Patient reported that she had stopped taking Rosuvastatin but recently restarted it after a talk with her physician. After speaking with me, she wondered if I could help her with the cost of Victoza.   Objective: Allergies  Allergen Reactions  . Prednisone Nausea And Vomiting  . Tramadol Nausea Only    Medications Reviewed Today    Reviewed by Elayne Guerin, [facility] (Pharmacist) on 04/06/20 at [place] List Status: <None>  Medication Order Taking? Sig Documenting Provider Last Dose Status Informant  allopurinol (ZYLOPRIM) 300 MG tablet [id number] Yes Take 1 tablet (300 mg total) by mouth daily. Leamon Arnt, MD Taking Active   bimatoprost (LUMIGAN) 0.01 % SOLN [id number] Yes Place 1 drop into both eyes at bedtime.  [provider] Taking Active Self  COMBIGAN 0.2-0.5 % ophthalmic solution [id number] Yes Place 1 drop into the left eye 2 (two) times a day.  [provider] Taking Active Self  folic acid (FOLVITE) 1 MG tablet [id number] Yes Take 1 tablet (1 mg total) by mouth daily. Cathlyn Parsons, PA-C Taking Active   lisinopril (ZESTRIL) 5 MG tablet [id number] Yes Take 1 tablet by mouth once daily Leamon Arnt, MD Taking Active   metFORMIN (GLUCOPHAGE) 500 MG tablet [id number] Yes Take 1 tablet (500 mg total) by mouth 2 (two) times daily. Leamon Arnt, MD Taking Active   NOVOFINE PLUS  32G X 4 MM MISC [id number] Yes CHECK SUGARS ONCE PER DAY [provider] Taking Active   omeprazole (PRILOSEC) 40 MG capsule [id number] Yes Take 1 capsule (40 mg total) by mouth daily. Cathlyn Parsons, PA-C Taking Active   rosuvastatin (CRESTOR) 20 MG tablet [id number] Yes Take 1 tablet (20 mg total) by mouth daily. Leamon Arnt, MD Taking Active   VICTOZA 18 MG/3ML SOPN [id number] Yes INJECT 1.8 MG INTO THE SKIN DAILY Leamon Arnt, MD Taking Active   vitamin B-12 (CYANOCOBALAMIN) 500 MCG tablet [id number] Yes Take 500 mcg by mouth daily. [provider] Taking Active           Assessment: HgA1c- 6.4% Prescribed Rosuvastatin- last filled 10/22/19 for a 90 day supply  Medication Assistance Findings:  Medication assistance needs identified: Victoza, Lumigan and Combian  Patient over-income for LIS/Extra Help Patient over income for both Lumigan and Combigan's Programs Communicated understanding about the necessary financial documentation (will send tax return with family household income)     Additional medication assistance options reviewed with patient as warranted:  No other options identified  Plan: I will route patient assistance letter to [place] technician who will coordinate patient assistance program application process for medications listed above.  [facility] pharmacy technician will assist with obtaining all required documents from both patient and provider(s) and submit application(s) once completed.     Elayne Guerin, PharmD, [place] Clinical Pharmacist [phone]

## 2020-04-11 ENCOUNTER — Other Ambulatory Visit: Payer: Self-pay | Admitting: Pharmacy Technician

## 2020-04-11 NOTE — Patient Outreach (Signed)
[place] [facility]) Care Management  04/11/2020  Teresa Barnes 01/03/1946 [id number]                                     Medication Assistance Referral  Referral From: [place]  Medication/Company: [name] / [employer] Patient application portion:  Education officer, museum portion: Faxed  to [employer] verified via: Office website    Follow up:  Will follow up with patient in 5-10 business days to confirm application(s) have been received.  Matthew Cina P. Lyndsay Talamante, [place]  [phone]

## 2020-04-24 ENCOUNTER — Other Ambulatory Visit: Payer: Self-pay | Admitting: Pharmacy Technician

## 2020-04-24 NOTE — Patient Outreach (Signed)
[facility]) Care Management  04/24/2020  Teresa Barnes Sep 23, 1946 [id number]   ADDENDUM   Return call placed to patient as requested by patient regarding patient assistance application(s) for Victoza with [employer] , Inquired if patient was available. The person who answered the phone said patient was not available and then disconnected the call.  Follow up:  Will outreach final attempt in 5-7 business days  [employer]. Lorine Iannaccone, [place]  [phone]

## 2020-04-24 NOTE — Patient Outreach (Signed)
[facility]) Care Management  04/24/2020  Teresa Barnes 10-16-1946 [id number]    Successful call placed to patient regarding patient assistance application(s) for Victoza with [employer] , Patient informed this was not a good time.. Patient requested a call back later this evening. Inquired if after 2pm would be okay and patient was agreeable to this plan.  Follow up:  Will attempt to call patient back later today after 2pm.  Sophina Mitten P. Derrik Mceachern, [place]  [phone]

## 2020-04-26 ENCOUNTER — Other Ambulatory Visit: Payer: Self-pay | Admitting: Family Medicine

## 2020-05-01 ENCOUNTER — Other Ambulatory Visit: Payer: Self-pay | Admitting: Pharmacy Technician

## 2020-05-01 NOTE — Patient Outreach (Signed)
[place]) Care Management  05/01/2020  JING HOWATT [birth year] [id number]   Successful call placed to patient regarding patient assistance application(s) for Victoza with [employer] , HIPAA identifiers verified.   Patient informs she is not interested in applying as she will not meet the financial guidelines set form by [employer] patient assistance program.  Follow up:  Will close patient assistance case at this time.  Bettymae Yott P. Shaniquia Brafford, [place]  [phone]

## 2020-05-03 ENCOUNTER — Ambulatory Visit (INDEPENDENT_AMBULATORY_CARE_PROVIDER_SITE_OTHER): Payer: Medicare Other | Admitting: Family Medicine

## 2020-05-03 ENCOUNTER — Other Ambulatory Visit: Payer: Self-pay

## 2020-05-03 ENCOUNTER — Encounter: Payer: Self-pay | Admitting: Family Medicine

## 2020-05-03 VITALS — BP 138/66 | HR 63 | Temp 97.3°F | Ht 64.0 in | Wt 173.4 lb

## 2020-05-03 DIAGNOSIS — E1159 Type 2 diabetes mellitus with other circulatory complications: Secondary | ICD-10-CM | POA: Diagnosis not present

## 2020-05-03 DIAGNOSIS — Z0000 Encounter for general adult medical examination without abnormal findings: Secondary | ICD-10-CM

## 2020-05-03 DIAGNOSIS — M1A00X0 Idiopathic chronic gout, unspecified site, without tophus (tophi): Secondary | ICD-10-CM

## 2020-05-03 DIAGNOSIS — D638 Anemia in other chronic diseases classified elsewhere: Secondary | ICD-10-CM | POA: Diagnosis not present

## 2020-05-03 DIAGNOSIS — E782 Mixed hyperlipidemia: Secondary | ICD-10-CM | POA: Diagnosis not present

## 2020-05-03 DIAGNOSIS — I10 Essential (primary) hypertension: Secondary | ICD-10-CM | POA: Diagnosis not present

## 2020-05-03 DIAGNOSIS — E119 Type 2 diabetes mellitus without complications: Secondary | ICD-10-CM

## 2020-05-03 LAB — POCT GLYCOSYLATED HEMOGLOBIN (HGB A1C): Hemoglobin A1C: 5.9 % — AB (ref 4.0–5.6)

## 2020-05-03 MED ORDER — ROSUVASTATIN CALCIUM 20 MG PO TABS: 20.0000 mg | ORAL_TABLET | ORAL | 3 refills | Status: DC

## 2020-05-03 NOTE — Patient Instructions (Signed)
Please return in 3 months for diabetes follow up  I will release your lab results to you on your MyChart account with further instructions. Please reply with any questions.   If you have any questions or concerns, please don't hesitate to send me a message via MyChart or call the office at [phone]. Thank you for visiting with [place] today! It's our pleasure caring for you.

## 2020-05-03 NOTE — Progress Notes (Signed)
Subjective  CC:  Chief Complaint  Patient presents with  . Diabetes    has been checking blood surgars at home and they have been normal   . Hypertension    does not take at home    HPI: Teresa Barnes is a 75 y.o. female who presents to the office today for follow up of diabetes and problems listed above in the chief complaint.   HM: screens are up to date. Getting stronger. Uses walker for ambulation. No recent falls. Mood is good overall but grieving sudden loss of mother 2 weeks ago.   Diabetes follow up: Her diabetic control is reported as Unchanged. Doing fine with meds. No sxs of hyperglycemia.  She denies exertional CP or SOB or symptomatic hypoglycemia. She denies foot sores or paresthesias.   HTN: Feeling well. Taking medications w/o adverse effects. No symptoms of CHF, angina; no palpitations, sob, cp or lower extremity edema. Compliant with meds.   HLD:now tolerating statin 3x/week  Anemia, chronic disease; no worseing fatigue or cp or sob. Tolerates supplements. Due for recheck.   C/o gout intermittently.   Wt Readings from Last 3 Encounters:  05/03/20 173 lb 6.4 oz (78.7 kg)  01/28/20 168 lb (76.2 kg)  06/03/19 184 lb (83.5 kg)    BP Readings from Last 3 Encounters:  05/03/20 138/66  01/28/20 128/60  08/18/19 (!) 120/58    Assessment  1. Annual physical exam   2. Controlled type 2 diabetes mellitus without complication, without long-term current use of insulin ([place])   3. Hypertension associated with diabetes ([place])   4. Mixed hyperlipidemia   5. Idiopathic chronic gout without tophus, unspecified site   6. Anemia of chronic disease      Plan    Female Wellness Visit:  Age appropriate Health Maintenance and Prevention measures were discussed with patient. Included topics are cancer screening recommendations, ways to keep healthy (see AVS) including dietary and exercise recommendations, regular eye and dental care, use of seat belts, and avoidance of  moderate alcohol use and tobacco use.   BMI: discussed patient's BMI and encouraged positive lifestyle modifications to help get to or maintain a target BMI.  HM needs and immunizations were addressed and ordered. See below for orders. See HM and immunization section for updates.  Routine labs and screening tests ordered including cmp, cbc and lipids where appropriate.  Discussed recommendations regarding Vit D and calcium supplementation (see AVS)  Chronic problem f/u:  Diabetes is currently very well controlled. rechec 3 months and decrease dose of victoza if a1c is lessening.  HTN is controlled. Low dose ace  hld on intermitten statin due to myalgia  Recheck anemia and folate/b12 and iron on oral supps  Gout: check uric acid on allopurinol  Grief: coping well.   Follow up: 3 months for dm. Orders Placed This Encounter  Procedures  . CBC with Differential/Platelet  . Comprehensive metabolic panel  . Lipid panel  . TSH  . POCT glycosylated hemoglobin (Hb A1C)   No orders of the defined types were placed in this encounter.     Immunization History  Administered Date(s) Administered  . Fluad Quad(high Dose 65+) 08/18/2019  . Influenza, High Dose Seasonal PF 08/18/2017, 07/15/2018  . Influenza,inj,Quad PF,6+ Mos 08/01/2016  . Influenza-Unspecified 08/18/2014  . Moderna SARS-COVID-2 Vaccination 01/01/2020, 01/29/2020  . Pneumococcal Conjugate-13 12/09/2017  . Pneumococcal Polysaccharide-23 09/24/2018  . Zoster Recombinat (Shingrix) 10/21/2018, 01/27/2019    Diabetes Related Lab Review: Lab Results  Component  Value Date   HGBA1C 5.9 (A) 05/03/2020   HGBA1C 6.4 (A) 08/18/2019   HGBA1C 6.7 (A) 05/17/2019    Lab Results  Component Value Date   MICROALBUR 8.1 (H) 03/09/2018   Lab Results  Component Value Date   CREATININE 1.62 (H) 08/18/2019   BUN 26 (H) 08/18/2019   NA 140 08/18/2019   K 4.2 08/18/2019   CL 104 08/18/2019   CO2 28 08/18/2019   Lab Results   Component Value Date   CHOL 134 08/18/2019   CHOL 180 05/17/2019   CHOL 175 03/09/2018   Lab Results  Component Value Date   HDL 47.70 08/18/2019   HDL 48.70 05/17/2019   HDL 64.30 03/09/2018   Lab Results  Component Value Date   LDLCALC 71 08/18/2019   LDLCALC 113 (H) 05/17/2019   [place] 95 03/09/2018   Lab Results  Component Value Date   TRIG 78.0 08/18/2019   TRIG 90.0 05/17/2019   TRIG 82.0 03/09/2018   Lab Results  Component Value Date   CHOLHDL 3 08/18/2019   CHOLHDL 4 05/17/2019   CHOLHDL 3 03/09/2018   No results found for: LDLDIRECT The 10-year ASCVD risk score Mikey Bussing DC Jr., et al., 2013) is: 25.1%   Values used to calculate the score:     Age: 59 years     Sex: Female     Is Non-Hispanic African American: Yes     Diabetic: Yes     Tobacco smoker: No     Systolic Blood Pressure: 326 mmHg     Is BP treated: Yes     HDL Cholesterol: 47.7 mg/dL     Total Cholesterol: 134 mg/dL I have reviewed the PMH, Fam and Soc history. Patient Active Problem List   Diagnosis Date Noted  . Mixed hyperlipidemia 12/09/2017    Priority: High  . Hypertension associated with diabetes ([place]) 12/09/2017    Priority: High  . H/O: upper GI bleed 07/30/2016    Priority: High    Due to severe erosive esophagitis; 2017; healed with PPI by egd f/u; has [place] and stricture; s/p blood transfusion. Negative for [place] esophagitis   . Controlled type 2 diabetes mellitus without complication, without long-term current use of insulin ([place]) 04/03/2009    Priority: High    dxd 1990; well controlled    . S/P cervical spinal fusion 03/26/2019    Priority: Medium    03/2019 severe spinal stenosis and cervical myelopathy   . Anemia of chronic disease 09/24/2018    Priority: Medium  . History of right breast cancer 12/09/2017    Priority: Medium    S/p mastectomy 1990   . Hiatal hernia 12/09/2017    Priority: Medium  . Esophageal stricture 12/09/2017    Priority: Medium    S/p  dilation by GI: Dr. Scarlette Shorts; due to h/o severe recurrent erosive esophagitis.   . Chronic gout 12/09/2017    Priority: Medium    On allopurinol   . Glaucoma 12/09/2017    Priority: Medium  . Degenerative arthritis of right knee 07/03/2017    Priority: Medium    Injected 07/03/2017' Injected August 05, 2018   . Right lumbar radiculopathy 01/23/2016    Priority: Medium  . Arthritis of right acromioclavicular joint 01/02/2016    Priority: Medium    Injected 01/02/2016 Repeat injection 03/12/2016   . Greater trochanteric bursitis of right hip 11/17/2017    Priority: Low    Injected November 17, 2017 Repeat injection May 05, 2018   .  Normochromic anemia     Priority: Low  . Acute bursitis of right shoulder 06/03/2019    Injected in June 03, 2019   . Adhesive bursitis of left shoulder 08/05/2018    Injected August 05, 2018     Social History: Patient  reports that she quit smoking about 26 years ago. Her smoking use included cigarettes. She has a 2.50 pack-year smoking history. She has never used smokeless tobacco. She reports that she does not drink alcohol and does not use drugs.  Review of Systems: Ophthalmic: negative for eye pain, loss of vision or double vision Cardiovascular: negative for chest pain Respiratory: negative for SOB or persistent cough Gastrointestinal: negative for abdominal pain Genitourinary: negative for dysuria or gross hematuria MSK: negative for foot lesions Neurologic: + for weakness or gait disturbance  Objective  Vitals: BP 138/66   Pulse 63   Temp (!) 97.3 F (36.3 C) (Temporal)   Ht 5\' 4"  (1.626 m)   Wt 173 lb 6.4 oz (78.7 kg)   SpO2 99%   BMI 29.76 kg/m  General: well appearing, no acute distress  Psych:  Alert and oriented, normal mood and affect HEENT:  Normocephalic, atraumatic, moist mucous membranes, supple neck  Cardiovascular:  Nl S1 and S2, RRR without murmur, gallop or rub. no edema Respiratory:  Good breath  sounds bilaterally, CTAB with normal effort, no rales Gastrointestinal: normal BS, soft, nontender Skin:  Warm, no rashes Ext: right hand swelling, tr pitting edema bil LE   Diabetic education: ongoing education regarding chronic disease management for diabetes was given today. We continue to reinforce the ABC's of diabetic management: A1c (<7 or 8 dependent upon patient), tight blood pressure control, and cholesterol management with goal LDL < 100 minimally. We discuss diet strategies, exercise recommendations, medication options and possible side effects. At each visit, we review recommended immunizations and preventive care recommendations for diabetics and stress that good diabetic control can prevent other problems. See below for this patient's data.    Commons side effects, risks, benefits, and alternatives for medications and treatment plan prescribed today were discussed, and the patient expressed understanding of the given instructions. Patient is instructed to call or message via MyChart if he/she has any questions or concerns regarding our treatment plan. No barriers to understanding were identified. We discussed Red Flag symptoms and signs in detail. Patient expressed understanding regarding what to do in case of urgent or emergency type symptoms.   Medication list was reconciled, printed and provided to the patient in AVS. Patient instructions and summary information was reviewed with the patient as documented in the AVS. This note was prepared with assistance of Dragon voice recognition software. Occasional wrong-word or sound-a-like substitutions may have occurred due to the inherent limitations of voice recognition software  This visit occurred during the SARS-CoV-2 public health emergency.  Safety protocols were in place, including screening questions prior to the visit, additional usage of staff PPE, and extensive cleaning of exam room while observing appropriate contact time as  indicated for disinfecting solutions.

## 2020-05-04 LAB — CBC WITH DIFFERENTIAL/PLATELET
Basophils Absolute: 0 10*3/uL (ref 0.0–0.1)
Basophils Relative: 0.5 % (ref 0.0–3.0)
Eosinophils Absolute: 0.6 10*3/uL (ref 0.0–0.7)
Eosinophils Relative: 7.4 % — ABNORMAL HIGH (ref 0.0–5.0)
HCT: 28 % — ABNORMAL LOW (ref 36.0–46.0)
Hemoglobin: 9.3 g/dL — ABNORMAL LOW (ref 12.0–15.0)
Lymphocytes Relative: 35 % (ref 12.0–46.0)
Lymphs Abs: 2.7 10*3/uL (ref 0.7–4.0)
MCHC: 33.2 g/dL (ref 30.0–36.0)
MCV: 96.5 fl (ref 78.0–100.0)
Monocytes Absolute: 0.8 10*3/uL (ref 0.1–1.0)
Monocytes Relative: 10.9 % (ref 3.0–12.0)
Neutro Abs: 3.6 10*3/uL (ref 1.4–7.7)
Neutrophils Relative %: 46.2 % (ref 43.0–77.0)
Platelets: 306 10*3/uL (ref 150.0–400.0)
RBC: 2.9 Mil/uL — ABNORMAL LOW (ref 3.87–5.11)
RDW: 15.2 % (ref 11.5–15.5)
WBC: 7.7 10*3/uL (ref 4.0–10.5)

## 2020-05-04 LAB — IRON,TIBC AND FERRITIN PANEL
%SAT: 22 % (calc) (ref 16–45)
Ferritin: 20 ng/mL (ref 16–288)
Iron: 69 ug/dL (ref 45–160)
TIBC: 318 mcg/dL (calc) (ref 250–450)

## 2020-05-04 LAB — COMPREHENSIVE METABOLIC PANEL
ALT: 7 U/L (ref 0–35)
AST: 13 U/L (ref 0–37)
Albumin: 4.2 g/dL (ref 3.5–5.2)
Alkaline Phosphatase: 56 U/L (ref 39–117)
BUN: 46 mg/dL — ABNORMAL HIGH (ref 6–23)
CO2: 26 mEq/L (ref 19–32)
Calcium: 10.1 mg/dL (ref 8.4–10.5)
Chloride: 105 mEq/L (ref 96–112)
Creatinine, Ser: 2.07 mg/dL — ABNORMAL HIGH (ref 0.40–1.20)
GFR: 28.19 mL/min — ABNORMAL LOW (ref >60.00)
Glucose, Bld: 98 mg/dL (ref 70–99)
Potassium: 5 mEq/L (ref 3.5–5.1)
Sodium: 138 mEq/L (ref 135–145)
Total Bilirubin: 0.5 mg/dL (ref 0.2–1.2)
Total Protein: 6.8 g/dL (ref 6.0–8.3)

## 2020-05-04 LAB — LIPID PANEL
Cholesterol: 171 mg/dL (ref 0–200)
HDL: 52.8 mg/dL (ref >39.00)
LDL Cholesterol: 100 mg/dL — ABNORMAL HIGH (ref 0–99)
NonHDL: 117.93
Total CHOL/HDL Ratio: 3
Triglycerides: 91 mg/dL (ref 0.0–149.0)
VLDL: 18.2 mg/dL (ref 0.0–40.0)

## 2020-05-04 LAB — B12 AND FOLATE PANEL
Folate: >24.8 ng/mL (ref >5.9)
Vitamin B-12: >1526 pg/mL — ABNORMAL HIGH (ref 211–911)

## 2020-05-04 LAB — TSH: TSH: 0.63 u[IU]/mL (ref 0.35–4.50)

## 2020-05-08 ENCOUNTER — Other Ambulatory Visit: Payer: Self-pay

## 2020-05-08 NOTE — Progress Notes (Signed)
Please call patient: I have reviewed his/her lab results. Kidney function is worsening.  Please order renal ultrasound.  Her anemia is stable; vit B12 and folate are now too high; may change to every other day oral OTC supplements.  Please return for lab appointment in 3-4 weeks for renal panel to f/u on potassium levels and CKD. I may need to adjust medications and refer to renal if worsening.  Please get her scheduled and order lab.  Thanks!

## 2020-05-09 ENCOUNTER — Other Ambulatory Visit: Payer: Self-pay

## 2020-05-09 DIAGNOSIS — I120 Hypertensive chronic kidney disease with stage 5 chronic kidney disease or end stage renal disease: Secondary | ICD-10-CM

## 2020-05-12 ENCOUNTER — Encounter (INDEPENDENT_AMBULATORY_CARE_PROVIDER_SITE_OTHER): Payer: Medicare Other | Admitting: Ophthalmology

## 2020-05-12 ENCOUNTER — Other Ambulatory Visit: Payer: Self-pay

## 2020-05-12 DIAGNOSIS — I10 Essential (primary) hypertension: Secondary | ICD-10-CM | POA: Diagnosis not present

## 2020-05-12 DIAGNOSIS — E11319 Type 2 diabetes mellitus with unspecified diabetic retinopathy without macular edema: Secondary | ICD-10-CM | POA: Diagnosis not present

## 2020-05-12 DIAGNOSIS — E113593 Type 2 diabetes mellitus with proliferative diabetic retinopathy without macular edema, bilateral: Secondary | ICD-10-CM | POA: Diagnosis not present

## 2020-05-12 DIAGNOSIS — H43813 Vitreous degeneration, bilateral: Secondary | ICD-10-CM

## 2020-05-12 DIAGNOSIS — H338 Other retinal detachments: Secondary | ICD-10-CM

## 2020-05-12 DIAGNOSIS — H35033 Hypertensive retinopathy, bilateral: Secondary | ICD-10-CM | POA: Diagnosis not present

## 2020-05-30 ENCOUNTER — Other Ambulatory Visit: Payer: Self-pay

## 2020-05-30 ENCOUNTER — Other Ambulatory Visit (INDEPENDENT_AMBULATORY_CARE_PROVIDER_SITE_OTHER): Payer: Medicare Other

## 2020-05-30 DIAGNOSIS — N185 Chronic kidney disease, stage 5: Secondary | ICD-10-CM | POA: Diagnosis not present

## 2020-05-30 DIAGNOSIS — I120 Hypertensive chronic kidney disease with stage 5 chronic kidney disease or end stage renal disease: Secondary | ICD-10-CM

## 2020-05-30 LAB — RENAL FUNCTION PANEL
Albumin: 4.2 g/dL (ref 3.5–5.2)
BUN: 61 mg/dL — ABNORMAL HIGH (ref 6–23)
CO2: 24 mEq/L (ref 19–32)
Calcium: 9.7 mg/dL (ref 8.4–10.5)
Chloride: 104 mEq/L (ref 96–112)
Creatinine, Ser: 2.63 mg/dL — ABNORMAL HIGH (ref 0.40–1.20)
GFR: 21.38 mL/min — ABNORMAL LOW (ref >60.00)
Glucose, Bld: 109 mg/dL — ABNORMAL HIGH (ref 70–99)
Phosphorus: 4.1 mg/dL (ref 2.3–4.6)
Potassium: 4.6 mEq/L (ref 3.5–5.1)
Sodium: 137 mEq/L (ref 135–145)

## 2020-05-30 NOTE — Addendum Note (Signed)
Addended by: Francella Solian on: 05/30/2020 08:33 AM   Modules accepted: Orders

## 2020-05-31 ENCOUNTER — Encounter: Payer: Self-pay | Admitting: Family Medicine

## 2020-05-31 DIAGNOSIS — N184 Chronic kidney disease, stage 4 (severe): Secondary | ICD-10-CM | POA: Insufficient documentation

## 2020-05-31 NOTE — Progress Notes (Signed)
Please call patient: I have reviewed his/her lab results. Her renal function remains worse. She has an ultrasound scheduled for next week.  Please place a nephrology referral for DX: CKD stage 4;  Please stop the metformin.  Avoid nsaids (advil, alleve).   No other changes are needed at this time.

## 2020-06-01 ENCOUNTER — Other Ambulatory Visit: Payer: Self-pay

## 2020-06-01 DIAGNOSIS — I120 Hypertensive chronic kidney disease with stage 5 chronic kidney disease or end stage renal disease: Secondary | ICD-10-CM

## 2020-06-01 DIAGNOSIS — N184 Chronic kidney disease, stage 4 (severe): Secondary | ICD-10-CM

## 2020-06-05 ENCOUNTER — Ambulatory Visit
Admission: RE | Admit: 2020-06-05 | Discharge: 2020-06-05 | Disposition: A | Discharge status: Home / Self Care | Payer: Medicare Other | Source: Ambulatory Visit | Attending: Family Medicine | Admitting: Family Medicine

## 2020-06-05 DIAGNOSIS — N189 Chronic kidney disease, unspecified: Secondary | ICD-10-CM | POA: Diagnosis not present

## 2020-06-05 DIAGNOSIS — I129 Hypertensive chronic kidney disease with stage 1 through stage 4 chronic kidney disease, or unspecified chronic kidney disease: Secondary | ICD-10-CM | POA: Diagnosis not present

## 2020-06-05 DIAGNOSIS — N185 Chronic kidney disease, stage 5: Secondary | ICD-10-CM

## 2020-06-12 ENCOUNTER — Ambulatory Visit: Payer: Medicare Other | Admitting: Podiatry

## 2020-06-12 ENCOUNTER — Encounter: Payer: Self-pay | Admitting: Podiatry

## 2020-06-12 ENCOUNTER — Other Ambulatory Visit: Payer: Self-pay

## 2020-06-12 DIAGNOSIS — B351 Tinea unguium: Secondary | ICD-10-CM

## 2020-06-12 DIAGNOSIS — M79675 Pain in left toe(s): Secondary | ICD-10-CM | POA: Diagnosis not present

## 2020-06-12 DIAGNOSIS — E119 Type 2 diabetes mellitus without complications: Secondary | ICD-10-CM

## 2020-06-12 DIAGNOSIS — M79674 Pain in right toe(s): Secondary | ICD-10-CM | POA: Diagnosis not present

## 2020-06-14 NOTE — Progress Notes (Signed)
Subjective:  Patient ID: Teresa Barnes, female    DOB: [birth year],  MRN: [id number]  74 y.o. female presents with at risk foot care. Pt has h/o NIDDM with chronic kidney disease and painful thick toenails that are difficult to trim. Pain interferes with ambulation. Aggravating factors include wearing enclosed shoe gear. Pain is relieved with periodic professional debridement.   Her husband is present during today's visit. They voice no new pedal concerns on today's visit.  Review of Systems: Negative except as noted in the HPI.   Dr. Billey Chang is her PCP. Last visit was 05/03/2020. Past Medical History:  Diagnosis Date  . Anemia   . Anemia of chronic disease 09/24/2018  . Anxiety   . Blood transfusion without reported diagnosis 07/2016  . Cancer ([place]) 1991   breast  . Chronic gout 12/09/2017   On allopurinol  . Diabetes mellitus without complication ([place])   . Esophageal stricture 12/09/2017   S/p dilation by GI: Dr. Scarlette Shorts; due to h/o severe recurrent erosive esophagitis.  . 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 GERD (gastroesophageal reflux disease)   . Glaucoma   . Hiatal hernia 12/09/2017  . Hyperlipidemia   . Hypertension   . Right rotator cuff tear 11/24/2015   Injected this and 26 2017  . Wears glasses    Past Surgical History:  Procedure Laterality Date  . ANTERIOR CERVICAL DECOMP/DISCECTOMY FUSION N/A 03/26/2019   Procedure: Anterior Cervical Discectomy Fusion - Cervical Three-Cervical Four - Cervical Four-Cervical Five - Cervical Five-Cervical Six;  Surgeon: Eustace Moore, MD;  Location: [facility];  Service: Neurosurgery;  Laterality: N/A;  Anterior Cervical Discectomy Fusion - Cervical Three-Cervical Four - Cervical Four-Cervical Five - Cervical Five-Cervical Six  . CATARACT EXTRACTION W/ INTRAOCULAR LENS  IMPLANT, BILATERAL  2000  . COLONOSCOPY    . cyst on spine    . ESOPHAGOGASTRODUODENOSCOPY (EGD) WITH PROPOFOL N/A 07/31/2016    Procedure: ESOPHAGOGASTRODUODENOSCOPY (EGD) WITH PROPOFOL;  Surgeon: Manus Gunning, MD;  Location: [facility];  Service: Gastroenterology;  Laterality: N/A;  . EYE SURGERY  2011   lt eye blled  . MASTECTOMY MODIFIED RADICAL Right 1990  . REDUCTION MAMMAPLASTY Left   . TRIGGER FINGER RELEASE Right 06/24/2013   Procedure: RELEASE TRIGGER FINGER/A-1 PULLEY PIP RIGHT LONG FINGER;  Surgeon: Cammie Sickle., MD;  Location: [facility];  Service: Orthopedics;  Laterality: Right;  . TRIGGER FINGER RELEASE    . TUBAL LIGATION     Patient Active Problem List   Diagnosis Date Noted  . CKD (chronic kidney disease) stage 4, GFR 15-29 ml/min ([facility]) 05/31/2020  . Acute bursitis of right shoulder 06/03/2019  . S/P cervical spinal fusion 03/26/2019  . Anemia of chronic disease 09/24/2018  . Adhesive bursitis of left shoulder 08/05/2018  . History of right breast cancer 12/09/2017  . Mixed hyperlipidemia 12/09/2017  . Hypertension associated with diabetes ([place]) 12/09/2017  . Hiatal hernia 12/09/2017  . Esophageal stricture 12/09/2017  . Chronic gout 12/09/2017  . Glaucoma 12/09/2017  . Greater trochanteric bursitis of right hip 11/17/2017  . Degenerative arthritis of right knee 07/03/2017  . Normochromic anemia   . H/O: upper GI bleed 07/30/2016  . Right lumbar radiculopathy 01/23/2016  . Arthritis of right acromioclavicular joint 01/02/2016  . Controlled type 2 diabetes mellitus without complication, without long-term current use of insulin ([place]) 04/03/2009    Current Outpatient Medications:  .  allopurinol (ZYLOPRIM) 300 MG  tablet, Take 1 tablet (300 mg total) by mouth daily., Disp: 90 tablet, Rfl: 3 .  bimatoprost (LUMIGAN) 0.01 % SOLN, Place 1 drop into both eyes at bedtime. , Disp: , Rfl:  .  COMBIGAN 0.2-0.5 % ophthalmic solution, Place 1 drop into the left eye 2 (two) times a day. , Disp: , Rfl: 3 .  folic acid (FOLVITE) 1 MG tablet, Take 1 tablet (1 mg total) by  mouth daily., Disp: 30 tablet, Rfl: 0 .  lisinopril (ZESTRIL) 5 MG tablet, Take 1 tablet by mouth once daily, Disp: 90 tablet, Rfl: 3 .  metFORMIN (GLUCOPHAGE) 500 MG tablet, Take 1 tablet (500 mg total) by mouth 2 (two) times daily., Disp: 180 tablet, Rfl: 3 .  NOVOFINE PLUS 32G X 4 MM MISC, CHECK SUGARS ONCE PER DAY, Disp: 100 each, Rfl: 0 .  omeprazole (PRILOSEC) 40 MG capsule, Take 1 capsule (40 mg total) by mouth daily., Disp: 30 capsule, Rfl: 2 .  rosuvastatin (CRESTOR) 20 MG tablet, Take 1 tablet (20 mg total) by mouth 3 (three) times a week., Disp: 90 tablet, Rfl: 3 .  VICTOZA 18 MG/3ML SOPN, INJECT 1.8 MG INTO THE SKIN DAILY, Disp: 9 mL, Rfl: 11 .  vitamin B-12 (CYANOCOBALAMIN) 500 MCG tablet, Take 500 mcg by mouth daily., Disp: , Rfl:  Allergies  Allergen Reactions  . Prednisone Nausea And Vomiting  . Tramadol Nausea Only   Social History   Tobacco Use  Smoking Status Former Smoker  . Packs/day: 0.25  . Years: 10.00  . Pack years: 2.50  . Types: Cigarettes  . Quit date: 06/18/1993  . Years since quitting: 27.0  Smokeless Tobacco Never Used   Objective:  There were no vitals filed for this visit. Constitutional Patient is a pleasant 74 y.o. African American @[employer] @   obese in NAD.Marland Kitchen  Vascular Neurovascular status unchanged b/l lower extremities. Capillary fill time to digits <3 seconds b/l lower extremities. Faintly palpable DP pulse(s) b/l lower extremities. Faintly palpable PT pulse(s) b/l lower extremities. Pedal hair sparse. Lower extremity skin temperature gradient within normal limits. No edema noted b/l lower extremities. No cyanosis or clubbing noted.  Neurologic Normal speech. Oriented to person, place, and time. Protective sensation intact 5/5 intact bilaterally with 10g monofilament b/l. Vibratory sensation intact b/l. Proprioception intact bilaterally.  Dermatologic Pedal skin with normal turgor, texture and tone bilaterally. No open wounds bilaterally. No  interdigital macerations bilaterally. Toenails 1-5 b/l elongated, discolored, dystrophic, thickened, crumbly with subungual debris and tenderness to dorsal palpation.  Orthopedic: Normal muscle strength 5/5 to all lower extremity muscle groups bilaterally. No pain crepitus or joint limitation noted with ROM b/l. No gross bony deformities bilaterally. Utilizes walker for ambulation assistance.   Hemoglobin A1C Latest Ref Rng & Units 05/03/2020 08/18/2019  HGBA1C 4.0 - 5.6 % 5.9(A) 6.4(A)  Some recent data might be hidden   Assessment:   1. Pain due to onychomycosis of toenails of both feet   2. Diabetes mellitus without complication ([place])    Plan:  Patient was evaluated and treated and all questions answered.  Onychomycosis with pain -Nails palliatively debridement as below. -Educated on self-care  Procedure: Nail Debridement Rationale: Pain Type of Debridement: manual, sharp debridement. Instrumentation: Nail nipper, rotary burr. Number of Nails: 10  -Examined patient. -Continue diabetic foot care principles. -Toenails 1-5 b/l were debrided in length and girth with sterile nail nippers and dremel without iatrogenic bleeding.  -Patient to report any pedal injuries to medical professional immediately. -Patient to continue  soft, supportive shoe gear daily. -Patient/POA to call should there be question/concern in the interim.  Return in about 3 months (around 09/12/2020) for diabetic nail trim.  Marzetta Board, DPM

## 2020-06-16 NOTE — Progress Notes (Signed)
Please call patient: I have reviewed her kidney ultrasound result: normal appearing kidneys consistent with chronic disease due to diabetes. She will see the nephrologist for monitoring. thanks

## 2020-06-18 ENCOUNTER — Other Ambulatory Visit: Payer: Self-pay | Admitting: Family Medicine

## 2020-06-22 ENCOUNTER — Telehealth: Payer: Self-pay | Admitting: Family Medicine

## 2020-06-22 ENCOUNTER — Other Ambulatory Visit: Payer: Self-pay

## 2020-06-22 DIAGNOSIS — E119 Type 2 diabetes mellitus without complications: Secondary | ICD-10-CM

## 2020-06-22 DIAGNOSIS — N184 Chronic kidney disease, stage 4 (severe): Secondary | ICD-10-CM

## 2020-06-22 DIAGNOSIS — N185 Chronic kidney disease, stage 5: Secondary | ICD-10-CM

## 2020-06-22 NOTE — Telephone Encounter (Signed)
Referrals placed 

## 2020-06-22 NOTE — Telephone Encounter (Signed)
Patient is calling in asking for a referral for and Endocrinologist and Nephrologist at [facility]. Specifically asked for DR.ABBIE SHAMLEFER for her diabetes

## 2020-07-11 DIAGNOSIS — N390 Urinary tract infection, site not specified: Secondary | ICD-10-CM | POA: Diagnosis not present

## 2020-07-11 DIAGNOSIS — N184 Chronic kidney disease, stage 4 (severe): Secondary | ICD-10-CM | POA: Diagnosis not present

## 2020-07-11 DIAGNOSIS — I129 Hypertensive chronic kidney disease with stage 1 through stage 4 chronic kidney disease, or unspecified chronic kidney disease: Secondary | ICD-10-CM | POA: Diagnosis not present

## 2020-07-11 DIAGNOSIS — D638 Anemia in other chronic diseases classified elsewhere: Secondary | ICD-10-CM | POA: Diagnosis not present

## 2020-07-11 DIAGNOSIS — E1122 Type 2 diabetes mellitus with diabetic chronic kidney disease: Secondary | ICD-10-CM | POA: Diagnosis not present

## 2020-07-11 DIAGNOSIS — N179 Acute kidney failure, unspecified: Secondary | ICD-10-CM | POA: Diagnosis not present

## 2020-07-18 DIAGNOSIS — E113593 Type 2 diabetes mellitus with proliferative diabetic retinopathy without macular edema, bilateral: Secondary | ICD-10-CM | POA: Diagnosis not present

## 2020-07-18 DIAGNOSIS — H40013 Open angle with borderline findings, low risk, bilateral: Secondary | ICD-10-CM | POA: Diagnosis not present

## 2020-08-02 ENCOUNTER — Other Ambulatory Visit: Payer: Self-pay | Admitting: Family Medicine

## 2020-08-09 ENCOUNTER — Encounter: Payer: Self-pay | Admitting: Internal Medicine

## 2020-08-09 ENCOUNTER — Other Ambulatory Visit: Payer: Self-pay

## 2020-08-09 ENCOUNTER — Ambulatory Visit: Payer: Medicare Other | Admitting: Internal Medicine

## 2020-08-09 ENCOUNTER — Telehealth: Payer: Medicare Other | Admitting: Family Medicine

## 2020-08-09 VITALS — BP 150/60 | HR 112 | Ht 64.0 in | Wt 176.2 lb

## 2020-08-09 DIAGNOSIS — E1122 Type 2 diabetes mellitus with diabetic chronic kidney disease: Secondary | ICD-10-CM

## 2020-08-09 DIAGNOSIS — N184 Chronic kidney disease, stage 4 (severe): Secondary | ICD-10-CM | POA: Insufficient documentation

## 2020-08-09 DIAGNOSIS — E785 Hyperlipidemia, unspecified: Secondary | ICD-10-CM | POA: Diagnosis not present

## 2020-08-09 DIAGNOSIS — E119 Type 2 diabetes mellitus without complications: Secondary | ICD-10-CM

## 2020-08-09 LAB — POCT GLYCOSYLATED HEMOGLOBIN (HGB A1C): Hemoglobin A1C: 6 % — AB (ref 4.0–5.6)

## 2020-08-09 LAB — POCT GLUCOSE (DEVICE FOR HOME USE): POC Glucose: 140 mg/dl — AB (ref 70–99)

## 2020-08-09 NOTE — Progress Notes (Signed)
Name: Teresa Barnes  MRN/ DOB: [id number], [birth year]   Age/ Sex: 74 y.o., female    PCP: Leamon Arnt, MD   Reason for Endocrinology Evaluation: Type 2 Diabetes Mellitus     Date of Initial Endocrinology Visit: 08/09/2020     PATIENT IDENTIFIER: Teresa Barnes is a 74 y.o. female with a past medical history of HTN, T2Dm, CKD and dyslipidemia.  The patient presented for initial endocrinology clinic visit on 08/09/2020 for consultative assistance with her diabetes management.    HPI: Teresa Barnes was    Diagnosed with DM many years ago  Prior Medications tried/Intolerance: metformin,  Currently checking blood sugars 1 x / day. Hypoglycemia episodes : no         Hemoglobin A1c has ranged from 5.9%in 2021, peaking at 7.9% in 2019. Patient required assistance for hypoglycemia: no Patient has required hospitalization within the last 1 year from hyper or hypoglycemia: no  In terms of diet, the patient eats 2 meals. Snacks at night. Drinks occasional ginger ale.      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 Anemia   . Anemia of chronic disease 09/24/2018  . Anxiety   . Blood transfusion without reported diagnosis 07/2016  . Cancer ([place]) 1991   breast  . Chronic gout 12/09/2017   On allopurinol  . Diabetes mellitus without complication ([place])   . Esophageal stricture 12/09/2017   S/p dilation by GI: Dr. Scarlette Shorts; due to h/o severe recurrent erosive esophagitis.  . 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 GERD (gastroesophageal reflux disease)   . Glaucoma   . Hiatal hernia 12/09/2017  . Hyperlipidemia   . Hypertension   . Right rotator cuff tear 11/24/2015   Injected this and 26 2017  . Wears glasses    Past Surgical History:  Past Surgical History:  Procedure Laterality Date  . ANTERIOR CERVICAL DECOMP/DISCECTOMY FUSION N/A 03/26/2019   Procedure: Anterior Cervical Discectomy Fusion - Cervical Three-Cervical Four - Cervical Four-Cervical Five - Cervical Five-Cervical Six;  Surgeon: Eustace Moore, MD;  Location: [facility];  Service: Neurosurgery;  Laterality: N/A;  Anterior Cervical Discectomy Fusion - Cervical Three-Cervical Four - Cervical Four-Cervical Five - Cervical Five-Cervical Six  . CATARACT EXTRACTION W/ INTRAOCULAR LENS  IMPLANT, BILATERAL  2000  . COLONOSCOPY    . cyst on spine    . ESOPHAGOGASTRODUODENOSCOPY (EGD) WITH PROPOFOL N/A 07/31/2016   Procedure: ESOPHAGOGASTRODUODENOSCOPY (EGD) WITH PROPOFOL;  Surgeon: Manus Gunning, MD;  Location: [facility];  Service: Gastroenterology;  Laterality: N/A;  . EYE SURGERY  2011   lt eye blled  . MASTECTOMY MODIFIED RADICAL Right 1990  . REDUCTION MAMMAPLASTY Left   . TRIGGER FINGER RELEASE Right 06/24/2013   Procedure: RELEASE TRIGGER FINGER/A-1 PULLEY PIP RIGHT LONG FINGER;  Surgeon: Cammie Sickle., MD;  Location: [facility];  Service: Orthopedics;  Laterality: Right;  . TRIGGER FINGER RELEASE    . TUBAL LIGATION  Social History:  reports that she quit smoking about 27 years ago. Her smoking use included cigarettes. She has a 2.50 pack-year smoking history. She has never used smokeless tobacco. She reports that she does not drink alcohol and does not use drugs. Family History:  Family History  Problem Relation Age of Onset  . Diabetes Mother   . Diabetes Daughter   . Arthritis Daughter   . Hyperlipidemia Daughter   . Diabetes Son   . Arthritis Son   . Diabetes Sister   . Colon cancer  Neg Hx   . Cancer Neg Hx   . Heart disease Neg Hx   . Breast cancer Neg Hx      HOME MEDICATIONS: Allergies as of 08/09/2020      Reactions   Prednisone Nausea And Vomiting   Tramadol Nausea Only      Medication List       Accurate as of August 09, 2020  9:53 AM. If you have any questions, ask your nurse or doctor.        STOP taking these medications   lisinopril 5 MG tablet Commonly known as: ZESTRIL Stopped by: Dorita Sciara, MD   metFORMIN 500 MG tablet Commonly known as: GLUCOPHAGE Stopped by: Dorita Sciara, MD     TAKE these medications   allopurinol 300 MG tablet Commonly known as: ZYLOPRIM Take 1 tablet (300 mg total) by mouth daily.   Combigan 0.2-0.5 % ophthalmic solution Generic drug: brimonidine-timolol Place 1 drop into the left eye 2 (two) times a day.   folic acid 1 MG tablet Commonly known as: FOLVITE Take 1 tablet (1 mg total) by mouth daily.   hydrALAZINE 25 MG tablet Commonly known as: APRESOLINE Take 25 mg by mouth in the morning and at bedtime. Take 1/2 tablet (12.5mg  total) by mouth twice daily.   Lumigan 0.01 % Soln Generic drug: bimatoprost Place 1 drop into both eyes at bedtime.   NovoFine Plus 32G X 4 MM Misc Generic drug: Insulin Pen Needle CHECK SUGARS ONCE PER DAY   omeprazole 40 MG capsule Commonly known as: PRILOSEC Take 1 capsule (40 mg total) by mouth daily.   rosuvastatin 20 MG tablet Commonly known as: CRESTOR Take 1 tablet by mouth once daily   Victoza 18 MG/3ML Sopn Generic drug: liraglutide INJECT 1.8 MG INTO THE SKIN DAILY   vitamin B-12 500 MCG tablet Commonly known as: CYANOCOBALAMIN Take 500 mcg by mouth daily.        ALLERGIES: Allergies  Allergen Reactions  . Prednisone Nausea And Vomiting  . Tramadol Nausea Only     REVIEW OF SYSTEMS: A comprehensive ROS was conducted with the patient and is negative except as per HPI and below:  Review of Systems  Gastrointestinal:  Negative for diarrhea and nausea.  Neurological: Negative for tingling.      OBJECTIVE:   VITAL SIGNS: BP (!) 150/60 (BP Location: Left Arm, Patient Position: Sitting, Cuff Size: Normal)   Pulse (!) 112   Ht 5\' 4"  (1.626 m)   Wt 176 lb 3.2 oz (79.9 kg)   SpO2 96%   BMI 30.24 kg/m    PHYSICAL EXAM:  General: Pt appears well and is in NAD  Neck: General: Supple without adenopathy or carotid bruits. Thyroid: Thyroid size normal.  No goiter or nodules appreciated. No thyroid bruit.  Lungs: Clear with good BS bilat with no rales, rhonchi, or wheezes  Heart: RRR with normal S1 and S2 and no gallops; no  murmurs; no rub  Abdomen: Normoactive bowel sounds, soft, nontender, without masses or organomegaly palpable  Extremities:  Lower extremities - No pretibial edema  Skin: Normal texture and temperature to palpation. No rash noted.   Neuro: MS is good with appropriate affect, pt is alert and Ox3    DM foot exam: pt wearing stockings   DATA REVIEWED:  Lab Results  Component Value Date   HGBA1C 6.0 (A) 08/09/2020   HGBA1C 5.9 (A) 05/03/2020   HGBA1C 6.4 (A) 08/18/2019   Lab Results  Component Value Date   MICROALBUR 8.1 (H) 03/09/2018   LDLCALC 100 (H) 05/03/2020   CREATININE 2.63 (H) 05/30/2020   Lab Results  Component Value Date   MICRALBCREAT 16.6 03/09/2018    Lab Results  Component Value Date   CHOL 171 05/03/2020   HDL 52.80 05/03/2020   LDLCALC 100 (H) 05/03/2020   TRIG 91.0 05/03/2020   CHOLHDL 3 05/03/2020        ASSESSMENT / PLAN / RECOMMENDATIONS:   1) Type 2 Diabetes Mellitus, Optimally controlled, With CKD IV complications - Most recent A1c of 6.0%. Goal A1c < 7.0 %.    Plan: GENERAL:  A1c skewed due to CKD I have discussed with the patient the pathophysiology of diabetes. We went over the natural progression of the disease. We talked about both insulin resistance and insulin deficiency. We stressed the importance of lifestyle changes including diet  and exercise. I explained the complications associated with diabetes including retinopathy, nephropathy, neuropathy as well as increased risk of cardiovascular disease. We went over the benefit seen with glycemic control.    I explained to the patient that diabetic patients are at higher than normal risk for amputations.   Her in-office Bg ( postprandial 141 mg/dL ), we discussed checking glucose twice daily and avoiding sugar-sweetened beverages.   She is in the donut hole, I have offered to start pt assistance for her but she declines at this time.   We discussed our limited treatment options with CKD IV, she declines insulin. We discussed SU in low doses if necessary     MEDICATIONS:  Continue Victoza 1.8 mg daily   EDUCATION / INSTRUCTIONS:  BG monitoring instructions: Patient is instructed to check her blood sugars 2 times a day, fasting and supper time   Call [facility] if: BG persistently < 70 . I reviewed the Rule of 15 for the treatment of hypoglycemia in detail with the patient. Literature supplied.   2) Diabetic complications:   Eye: Does not have known diabetic retinopathy.   Neuro/ Feet: Does not have known diabetic peripheral neuropathy.  Renal: Patient does have known baseline CKD. She is off lisinopril    3) Dyslipidemia: Patient is on rosuvastatin.. Last LDL was borderline, we discussed cardiovascular benefits of statins.        F/U in 3 months   Signed electronically by: Mack Guise, MD  [facility]  [address] [place], [place] [address] Phone: [phone] FAX: [phone]   CC: Leamon Arnt, [place] [address] Phone: [phone]  Fax: [phone]    Return to Endocrinology clinic as below: Future Appointments  Date Time Provider [place]  08/28/2020 10:00 AM Leamon Arnt, MD [facility]  09/12/2020  2:00 PM  Marzetta Board, DPM [facility]  12/11/2020  2:00 PM Marzetta Board, DPM [facility]  05/14/2021  2:15 PM Hayden Pedro,  MD [facility] None

## 2020-08-09 NOTE — Patient Instructions (Addendum)
-   You are doing a great job with taking care of your diabetes - Continue Victoza 1.8 mg daily     - Check sugar fasting and supper time      HOW TO TREAT LOW BLOOD SUGARS (Blood sugar LESS THAN 70 MG/DL)  Please follow the RULE OF 15 for the treatment of hypoglycemia treatment (when your (blood sugars are less than 70 mg/dL)    STEP 1: Take 15 grams of carbohydrates when your blood sugar is low, which includes:   3-4 GLUCOSE TABS  OR  3-4 OZ OF JUICE OR REGULAR SODA OR  ONE TUBE OF GLUCOSE GEL     STEP 2: RECHECK blood sugar in 15 MINUTES STEP 3: If your blood sugar is still low at the 15 minute recheck --> then, go back to STEP 1 and treat AGAIN with another 15 grams of carbohydrates.

## 2020-08-28 ENCOUNTER — Encounter: Payer: Self-pay | Admitting: Family Medicine

## 2020-08-28 ENCOUNTER — Ambulatory Visit (INDEPENDENT_AMBULATORY_CARE_PROVIDER_SITE_OTHER): Payer: Medicare Other | Admitting: Family Medicine

## 2020-08-28 ENCOUNTER — Other Ambulatory Visit: Payer: Self-pay | Admitting: Family Medicine

## 2020-08-28 ENCOUNTER — Other Ambulatory Visit: Payer: Self-pay

## 2020-08-28 VITALS — BP 140/62 | HR 63 | Temp 97.8°F | Ht 64.0 in | Wt 174.8 lb

## 2020-08-28 DIAGNOSIS — I152 Hypertension secondary to endocrine disorders: Secondary | ICD-10-CM

## 2020-08-28 DIAGNOSIS — E119 Type 2 diabetes mellitus without complications: Secondary | ICD-10-CM | POA: Diagnosis not present

## 2020-08-28 DIAGNOSIS — N184 Chronic kidney disease, stage 4 (severe): Secondary | ICD-10-CM

## 2020-08-28 DIAGNOSIS — E1159 Type 2 diabetes mellitus with other circulatory complications: Secondary | ICD-10-CM | POA: Diagnosis not present

## 2020-08-28 NOTE — Progress Notes (Signed)
Subjective  CC:  Chief Complaint  Patient presents with  . Diabetes    sugar last checked at endo, was 140 after eating breakfast    HPI: Teresa Barnes is a 74 y.o. female who presents to the office today for follow up of diabetes and problems listed above in the chief complaint.   Diabetes follow up: Her diabetic control is reported as Unchanged.  She has started seeing endocrinology.  I reviewed her notes.  She is due an A1c and will be getting that at [facility] next week.  Continues on Victoza daily. She denies exertional CP or SOB or symptomatic hypoglycemia. She denies foot sores or paresthesias.   Chronic kidney disease: Has seen nephrology.  Awaiting notes.  Reports creatinine was up to 3 but encouraged that things will improve since being off lisinopril and Metformin.  No lower extremity edema.  No chest pain or shortness of breath.  Had normal ultrasound.  Will have follow-up labs in 1 to 2 weeks per renal.  Hypertension: Lisinopril stopped.  Now on hydralazine.  Monitoring closely with cardiology.  No chest pain.   Wt Readings from Last 3 Encounters:  08/09/20 176 lb 3.2 oz (79.9 kg)  05/03/20 173 lb 6.4 oz (78.7 kg)  01/28/20 168 lb (76.2 kg)    BP Readings from Last 3 Encounters:  08/09/20 (!) 150/60  05/03/20 138/66  01/28/20 128/60    Assessment  1. Controlled type 2 diabetes mellitus without complication, without long-term current use of insulin ([place])   2. CKD (chronic kidney disease) stage 4, GFR 15-29 ml/min ([facility])   3. Hypertension associated with diabetes ([place])      Plan   Diabetes is currently well controlled.  Will check A1c with next lab work.  Continue with endocrinology.  Immunizations are up-to-date.  Chronic kidney disease: Awaiting nephrology notes.  Has follow-up labs pending.  Would consider SGLT2 inhibitor.  Hypertension: Mildly elevated today.  Recheck at next office visit.  Adjusting medications.  Follow up: 6 months for recheck. No orders  of the defined types were placed in this encounter.  No orders of the defined types were placed in this encounter.     Immunization History  Administered Date(s) Administered  . Fluad Quad(high Dose 65+) 08/18/2019  . Influenza, High Dose Seasonal PF 08/18/2017, 07/15/2018, 08/14/2020  . Influenza,inj,Quad PF,6+ Mos 08/01/2016  . Influenza-Unspecified 08/18/2014  . Moderna SARS-COVID-2 Vaccination 01/01/2020, 01/29/2020  . Pneumococcal Conjugate-13 12/09/2017  . Pneumococcal Polysaccharide-23 09/24/2018  . Zoster Recombinat (Shingrix) 10/21/2018, 01/27/2019    Diabetes Related Lab Review: Lab Results  Component Value Date   HGBA1C 6.0 (A) 08/09/2020   HGBA1C 5.9 (A) 05/03/2020   HGBA1C 6.4 (A) 08/18/2019    Lab Results  Component Value Date   MICROALBUR 8.1 (H) 03/09/2018   Lab Results  Component Value Date   CREATININE 2.63 (H) 05/30/2020   BUN 61 (H) 05/30/2020   NA 137 05/30/2020   K 4.6 05/30/2020   CL 104 05/30/2020   CO2 24 05/30/2020   Lab Results  Component Value Date   CHOL 171 05/03/2020   CHOL 134 08/18/2019   CHOL 180 05/17/2019   Lab Results  Component Value Date   HDL 52.80 05/03/2020   HDL 47.70 08/18/2019   HDL 48.70 05/17/2019   Lab Results  Component Value Date   LDLCALC 100 (H) 05/03/2020   LDLCALC 71 08/18/2019   LDLCALC 113 (H) 05/17/2019   Lab Results  Component Value Date  TRIG 91.0 05/03/2020   TRIG 78.0 08/18/2019   TRIG 90.0 05/17/2019   Lab Results  Component Value Date   CHOLHDL 3 05/03/2020   CHOLHDL 3 08/18/2019   CHOLHDL 4 05/17/2019   No results found for: LDLDIRECT The 10-year ASCVD risk score Mikey Bussing DC Jr., et al., 2013) is: 36%   Values used to calculate the score:     Age: 69 years     Sex: Female     Is Non-Hispanic African American: Yes     Diabetic: Yes     Tobacco smoker: No     Systolic Blood Pressure: 970 mmHg     Is BP treated: Yes     HDL Cholesterol: 52.8 mg/dL     Total Cholesterol: 171  mg/dL I have reviewed the PMH, Fam and Soc history. Patient Active Problem List   Diagnosis Date Noted  . Type 2 diabetes mellitus with stage 4 chronic kidney disease, without long-term current use of insulin ([place]) 08/09/2020    Priority: High  . CKD (chronic kidney disease) stage 4, GFR 15-29 ml/min ([facility]) 05/31/2020    Priority: High    Normal ultrasound, 07/2020; referred to renal 05/2020   . Mixed hyperlipidemia 12/09/2017    Priority: High  . Hypertension associated with diabetes ([place]) 12/09/2017    Priority: High  . H/O: upper GI bleed 07/30/2016    Priority: High    Due to severe erosive esophagitis; 2017; healed with PPI by egd f/u; has [place] and stricture; s/p blood transfusion. Negative for [place]ts esophagitis   . Controlled type 2 diabetes mellitus without complication, without long-term current use of insulin ([place]) 04/03/2009    Priority: High    dxd 1990; well controlled    . S/P cervical spinal fusion 03/26/2019    Priority: Medium    03/2019 severe spinal stenosis and cervical myelopathy   . Anemia of chronic disease 09/24/2018    Priority: Medium  . History of right breast cancer 12/09/2017    Priority: Medium    S/p mastectomy 1990   . Hiatal hernia 12/09/2017    Priority: Medium  . Esophageal stricture 12/09/2017    Priority: Medium    S/p dilation by GI: Dr. Scarlette Shorts; due to h/o severe recurrent erosive esophagitis.   . Chronic gout 12/09/2017    Priority: Medium    On allopurinol   . Glaucoma 12/09/2017    Priority: Medium  . Degenerative arthritis of right knee 07/03/2017    Priority: Medium    Injected 07/03/2017' Injected August 05, 2018   . Right lumbar radiculopathy 01/23/2016    Priority: Medium  . Arthritis of right acromioclavicular joint 01/02/2016    Priority: Medium    Injected 01/02/2016 Repeat injection 03/12/2016   . Greater trochanteric bursitis of right hip 11/17/2017    Priority: Low    Injected November 17, 2017 Repeat  injection May 05, 2018   . Normochromic anemia     Priority: Low  . Dyslipidemia 08/09/2020  . Acute bursitis of right shoulder 06/03/2019    Injected in June 03, 2019   . Adhesive bursitis of left shoulder 08/05/2018    Injected August 05, 2018     Social History: Patient  reports that she quit smoking about 27 years ago. Her smoking use included cigarettes. She has a 2.50 pack-year smoking history. She has never used smokeless tobacco. She reports that she does not drink alcohol and does not use drugs.  Review of Systems: Ophthalmic: negative  for eye pain, loss of vision or double vision Cardiovascular: negative for chest pain Respiratory: negative for SOB or persistent cough Gastrointestinal: negative for abdominal pain Genitourinary: negative for dysuria or gross hematuria MSK: negative for foot lesions Neurologic: negative for weakness or gait disturbance  Objective  Vitals: There were no vitals taken for this visit. General: well appearing, no acute distress  Psych:  Alert and oriented, normal mood and affect Cardiovascular:  Nl S1 and S2, RRR without murmur, gallop or rub. no edema Respiratory:  Good breath sounds bilaterally, CTAB with normal effort, no rales   Diabetic education: ongoing education regarding chronic disease management for diabetes was given today. We continue to reinforce the ABC's of diabetic management: A1c (<7 or 8 dependent upon patient), tight blood pressure control, and cholesterol management with goal LDL < 100 minimally. We discuss diet strategies, exercise recommendations, medication options and possible side effects. At each visit, we review recommended immunizations and preventive care recommendations for diabetics and stress that good diabetic control can prevent other problems. See below for this patient's data.    Commons side effects, risks, benefits, and alternatives for medications and treatment plan prescribed today were discussed,  and the patient expressed understanding of the given instructions. Patient is instructed to call or message via MyChart if he/she has any questions or concerns regarding our treatment plan. No barriers to understanding were identified. We discussed Red Flag symptoms and signs in detail. Patient expressed understanding regarding what to do in case of urgent or emergency type symptoms.   Medication list was reconciled, printed and provided to the patient in AVS. Patient instructions and summary information was reviewed with the patient as documented in the AVS. This note was prepared with assistance of Dragon voice recognition software. Occasional wrong-word or sound-a-like substitutions may have occurred due to the inherent limitations of voice recognition software  This visit occurred during the SARS-CoV-2 public health emergency.  Safety protocols were in place, including screening questions prior to the visit, additional usage of staff PPE, and extensive cleaning of exam room while observing appropriate contact time as indicated for disinfecting solutions.

## 2020-08-28 NOTE — Patient Instructions (Signed)
Please return in 6 months for follow up blood pressure and diabetes.  Please have [facility] order an A1c. I've ordered it in the computer for you.  If you have any questions or concerns, please don't hesitate to send me a message via MyChart or call the office at [phone]. Thank you for visiting with [place] today! It's our pleasure caring for you.

## 2020-09-08 ENCOUNTER — Ambulatory Visit: Payer: Medicare Other | Admitting: Internal Medicine

## 2020-09-08 DIAGNOSIS — N184 Chronic kidney disease, stage 4 (severe): Secondary | ICD-10-CM | POA: Diagnosis not present

## 2020-09-08 DIAGNOSIS — N390 Urinary tract infection, site not specified: Secondary | ICD-10-CM | POA: Diagnosis not present

## 2020-09-08 DIAGNOSIS — D631 Anemia in chronic kidney disease: Secondary | ICD-10-CM | POA: Diagnosis not present

## 2020-09-12 ENCOUNTER — Other Ambulatory Visit: Payer: Self-pay

## 2020-09-12 ENCOUNTER — Ambulatory Visit: Payer: Medicare Other | Admitting: Podiatry

## 2020-09-12 ENCOUNTER — Encounter: Payer: Self-pay | Admitting: Podiatry

## 2020-09-12 DIAGNOSIS — N184 Chronic kidney disease, stage 4 (severe): Secondary | ICD-10-CM | POA: Diagnosis not present

## 2020-09-12 DIAGNOSIS — B351 Tinea unguium: Secondary | ICD-10-CM | POA: Diagnosis not present

## 2020-09-12 DIAGNOSIS — E119 Type 2 diabetes mellitus without complications: Secondary | ICD-10-CM | POA: Diagnosis not present

## 2020-09-12 DIAGNOSIS — E1122 Type 2 diabetes mellitus with diabetic chronic kidney disease: Secondary | ICD-10-CM | POA: Diagnosis not present

## 2020-09-12 DIAGNOSIS — N390 Urinary tract infection, site not specified: Secondary | ICD-10-CM | POA: Diagnosis not present

## 2020-09-12 DIAGNOSIS — M79674 Pain in right toe(s): Secondary | ICD-10-CM | POA: Diagnosis not present

## 2020-09-12 DIAGNOSIS — M79675 Pain in left toe(s): Secondary | ICD-10-CM

## 2020-09-12 DIAGNOSIS — R3129 Other microscopic hematuria: Secondary | ICD-10-CM | POA: Diagnosis not present

## 2020-09-12 DIAGNOSIS — I129 Hypertensive chronic kidney disease with stage 1 through stage 4 chronic kidney disease, or unspecified chronic kidney disease: Secondary | ICD-10-CM | POA: Diagnosis not present

## 2020-09-12 DIAGNOSIS — N179 Acute kidney failure, unspecified: Secondary | ICD-10-CM | POA: Diagnosis not present

## 2020-09-12 DIAGNOSIS — D638 Anemia in other chronic diseases classified elsewhere: Secondary | ICD-10-CM | POA: Diagnosis not present

## 2020-09-16 NOTE — Progress Notes (Signed)
Subjective:  Patient ID: Teresa Barnes, female    DOB: [birth year],  MRN: [id number]  74 y.o. female presents with at risk foot care. Pt has h/o NIDDM with chronic kidney disease and painful thick toenails that are difficult to trim. Pain interferes with ambulation. Aggravating factors include wearing enclosed shoe gear. Pain is relieved with periodic professional debridement.   Her husband is present during today's visit. They voice no new pedal concerns on today's visit. She did not check her blood glucose this morning.  Review of Systems: Negative except as noted in the HPI.   Dr. Billey Chang is her PCP. Last visit was 08/28/2020. Past Medical History:  Diagnosis Date  . Anemia   . Anemia of chronic disease 09/24/2018  . Anxiety   . Blood transfusion without reported diagnosis 07/2016  . Cancer ([facility]) 1991   breast  . Chronic gout 12/09/2017   On allopurinol  . Diabetes mellitus without complication ([place])   . Esophageal stricture 12/09/2017   S/p dilation by GI: Dr. Scarlette Shorts; due to h/o severe recurrent erosive esophagitis.  . 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 GERD (gastroesophageal reflux disease)   . Glaucoma   . Hiatal hernia 12/09/2017  . Hyperlipidemia   . Hypertension   . Right rotator cuff tear 11/24/2015   Injected this and 26 2017  . Wears glasses    Past Surgical History:  Procedure Laterality Date  . ANTERIOR CERVICAL DECOMP/DISCECTOMY FUSION N/A 03/26/2019   Procedure: Anterior Cervical Discectomy Fusion - Cervical Three-Cervical Four - Cervical Four-Cervical Five - Cervical Five-Cervical Six;  Surgeon: Eustace Moore, MD;  Location: [facility];  Service: Neurosurgery;  Laterality: N/A;  Anterior Cervical Discectomy Fusion - Cervical Three-Cervical Four - Cervical Four-Cervical Five - Cervical Five-Cervical Six  . CATARACT EXTRACTION W/ INTRAOCULAR LENS  IMPLANT, BILATERAL  2000  . COLONOSCOPY    . cyst on spine    .  ESOPHAGOGASTRODUODENOSCOPY (EGD) WITH PROPOFOL N/A 07/31/2016   Procedure: ESOPHAGOGASTRODUODENOSCOPY (EGD) WITH PROPOFOL;  Surgeon: Manus Gunning, MD;  Location: [facility];  Service: Gastroenterology;  Laterality: N/A;  . EYE SURGERY  2011   lt eye blled  . MASTECTOMY MODIFIED RADICAL Right 1990  . REDUCTION MAMMAPLASTY Left   . TRIGGER FINGER RELEASE Right 06/24/2013   Procedure: RELEASE TRIGGER FINGER/A-1 PULLEY PIP RIGHT LONG FINGER;  Surgeon: Cammie Sickle., MD;  Location: [facility];  Service: Orthopedics;  Laterality: Right;  . TRIGGER FINGER RELEASE    . TUBAL LIGATION     Patient Active Problem List   Diagnosis Date Noted  . Type 2 diabetes mellitus with stage 4 chronic kidney disease, without long-term current use of insulin ([facility]) 08/09/2020  . Dyslipidemia 08/09/2020  . CKD (chronic kidney disease) stage 4, GFR 15-29 ml/min ([facility]) 05/31/2020  . Acute bursitis of right shoulder 06/03/2019  . S/P cervical spinal fusion 03/26/2019  . Anemia of chronic disease 09/24/2018  . Adhesive bursitis of left shoulder 08/05/2018  . History of right breast cancer 12/09/2017  . Mixed hyperlipidemia 12/09/2017  . Hypertension associated with diabetes ([facility]) 12/09/2017  . Hiatal hernia 12/09/2017  . Esophageal stricture 12/09/2017  . Chronic gout 12/09/2017  . Glaucoma 12/09/2017  . Greater trochanteric bursitis of right hip 11/17/2017  . Degenerative arthritis of right knee 07/03/2017  . Normochromic anemia   . H/O: upper GI bleed 07/30/2016  . Right lumbar radiculopathy 01/23/2016  . Arthritis of right  acromioclavicular joint 01/02/2016  . Controlled type 2 diabetes mellitus without complication, without long-term current use of insulin ([facility]) 04/03/2009    Current Outpatient Medications:  .  allopurinol (ZYLOPRIM) 300 MG tablet, Take 1 tablet (300 mg total) by mouth daily., Disp: 90 tablet, Rfl: 3 .  bimatoprost (LUMIGAN) 0.01 % SOLN, Place 1 drop into both  eyes at bedtime. , Disp: , Rfl:  .  cephALEXin (KEFLEX) 500 MG capsule, Take 500 mg by mouth 2 (two) times daily., Disp: , Rfl:  .  COMBIGAN 0.2-0.5 % ophthalmic solution, Place 1 drop into the left eye 2 (two) times a day. , Disp: , Rfl: 3 .  dorzolamide-timolol (COSOPT) 22.3-6.8 MG/ML ophthalmic solution, , Disp: , Rfl:  .  folic acid (FOLVITE) 1 MG tablet, Take 1 tablet (1 mg total) by mouth daily., Disp: 30 tablet, Rfl: 0 .  hydrALAZINE (APRESOLINE) 25 MG tablet, Take 12.5 mg by mouth in the morning and at bedtime., Disp: , Rfl:  .  latanoprost (XALATAN) 0.005 % ophthalmic solution, SMARTSIG:1 Drop(s) In Eye(s) Every Evening, Disp: , Rfl:  .  NOVOFINE PLUS 32G X 4 MM MISC, CHECK SUGARS ONCE PER DAY, Disp: 100 each, Rfl: 0 .  omeprazole (PRILOSEC) 40 MG capsule, Take 1 capsule (40 mg total) by mouth daily., Disp: 30 capsule, Rfl: 2 .  rosuvastatin (CRESTOR) 20 MG tablet, Take 1 tablet by mouth once daily, Disp: 90 tablet, Rfl: 3 .  VICTOZA 18 MG/3ML SOPN, INJECT 1.8 MG INTO THE SKIN DAILY, Disp: 9 mL, Rfl: 11 .  vitamin B-12 (CYANOCOBALAMIN) 500 MCG tablet, Take 500 mcg by mouth daily., Disp: , Rfl:  Allergies  Allergen Reactions  . Prednisone Nausea And Vomiting  . Tramadol Nausea Only   Social History   Tobacco Use  Smoking Status Former Smoker  . Packs/day: 0.25  . Years: 10.00  . Pack years: 2.50  . Types: Cigarettes  . Quit date: 06/18/1993  . Years since quitting: 27.2  Smokeless Tobacco Never Used   Objective:  There were no vitals filed for this visit. Constitutional Patient is a pleasant 74 y.o. African American @[employer] @   obese in NAD.Marland Kitchen  Vascular Neurovascular status unchanged b/l lower extremities. Capillary fill time to digits <3 seconds b/l lower extremities. Faintly palpable DP pulse(s) b/l lower extremities. Faintly palpable PT pulse(s) b/l lower extremities. Pedal hair sparse. Lower extremity skin temperature gradient within normal limits. No edema noted b/l lower  extremities. No cyanosis or clubbing noted.  Neurologic Normal speech. Oriented to person, place, and time. Protective sensation intact 5/5 intact bilaterally with 10g monofilament b/l. Vibratory sensation intact b/l. Proprioception intact bilaterally.  Dermatologic Pedal skin with normal turgor, texture and tone bilaterally. No open wounds bilaterally. No interdigital macerations bilaterally. Toenails 1-5 b/l elongated, discolored, dystrophic, thickened, crumbly with subungual debris and tenderness to dorsal palpation.  Orthopedic: Normal muscle strength 5/5 to all lower extremity muscle groups bilaterally. No pain crepitus or joint limitation noted with ROM b/l. No gross bony deformities bilaterally. Utilizes walker for ambulation assistance.   Hemoglobin A1C Latest Ref Rng & Units 08/09/2020 05/03/2020  HGBA1C 4.0 - 5.6 % 6.0(A) 5.9(A)  Some recent data might be hidden   Assessment:   1. Pain due to onychomycosis of toenails of both feet   2. Diabetes mellitus without complication ([facility])    Plan:  Patient was evaluated and treated and all questions answered.  Onychomycosis with pain -Nails palliatively debridement as below. -Educated on self-care  Procedure: Nail Debridement  Rationale: Pain Type of Debridement: manual, sharp debridement. Instrumentation: Nail nipper, rotary burr. Number of Nails: 10  -Examined patient. -Continue diabetic foot care principles. -Toenails 1-5 b/l were debrided in length and girth with sterile nail nippers and dremel without iatrogenic bleeding.  -Patient to report any pedal injuries to medical professional immediately. -Patient to continue soft, supportive shoe gear daily. -Patient/POA to call should there be question/concern in the interim.  Return in about 3 months (around 12/13/2020) for diabetic toenails.  Marzetta Board, DPM

## 2020-09-21 ENCOUNTER — Ambulatory Visit (INDEPENDENT_AMBULATORY_CARE_PROVIDER_SITE_OTHER): Payer: Medicare Other

## 2020-09-21 DIAGNOSIS — Z0000 Encounter for general adult medical examination without abnormal findings: Secondary | ICD-10-CM

## 2020-09-21 NOTE — Patient Instructions (Addendum)
Teresa Barnes , Thank you for taking time to come for your Medicare Wellness Visit. I appreciate your ongoing commitment to your health goals. Please review the following plan we discussed and let me know if I can assist you in the future.   Screening recommendations/referrals: Colonoscopy: Done 06/15/14 Mammogram: Done 10/11/19 Bone Density: Done 02/16/18 Recommended yearly ophthalmology/optometry visit for glaucoma screening and checkup Recommended yearly dental visit for hygiene and checkup  Vaccinations: Influenza vaccine: Done 08/14/20 Pneumococcal vaccine: Up to date Tdap vaccine: Due and can contact pharmacy for coverage information Shingles vaccine: Completed 10/21/18 & 01/27/19   Covid-19:Completed 2/13 & 01/29/20  Advanced directives: Advance directive discussed with you today. I have provided a copy for you to complete at home and have notarized. Once this is complete please bring a copy in to our office so we can scan it into your chart. Husband handles decisions   Conditions/risks identified: To start to walk better and get PT in home help  Next appointment: Follow up in one year for your annual wellness visit    Preventive Care 76 Years and Older, Female Preventive care refers to lifestyle choices and visits with your health care provider that can promote health and wellness. What does preventive care include?  A yearly physical exam. This is also called an annual well check.  Dental exams once or twice a year.  Routine eye exams. Ask your health care provider how often you should have your eyes checked.  Personal lifestyle choices, including:  Daily care of your teeth and gums.  Regular physical activity.  Eating a healthy diet.  Avoiding tobacco and drug use.  Limiting alcohol use.  Practicing safe sex.  Taking low-dose aspirin every day.  Taking vitamin and mineral supplements as recommended by your health care provider. What happens during an annual well  check? The services and screenings done by your health care provider during your annual well check will depend on your age, overall health, lifestyle risk factors, and family history of disease. Counseling  Your health care provider may ask you questions about your:  Alcohol use.  Tobacco use.  Drug use.  Emotional well-being.  Home and relationship well-being.  Sexual activity.  Eating habits.  History of falls.  Memory and ability to understand (cognition).  Work and work Statistician.  Reproductive health. Screening  You may have the following tests or measurements:  Height, weight, and BMI.  Blood pressure.  Lipid and cholesterol levels. These may be checked every 5 years, or more frequently if you are over 71 years old.  Skin check.  Lung cancer screening. You may have this screening every year starting at age 30 if you have a 30-pack-year history of smoking and currently smoke or have quit within the past 15 years.  Fecal occult blood test (FOBT) of the stool. You may have this test every year starting at age 29.  Flexible sigmoidoscopy or colonoscopy. You may have a sigmoidoscopy every 5 years or a colonoscopy every 10 years starting at age 11.  Hepatitis C blood test.  Hepatitis B blood test.  Sexually transmitted disease (STD) testing.  Diabetes screening. This is done by checking your blood sugar (glucose) after you have not eaten for a while (fasting). You may have this done every 1-3 years.  Bone density scan. This is done to screen for osteoporosis. You may have this done starting at age 66.  Mammogram. This may be done every 1-2 years. Talk to your health care provider  about how often you should have regular mammograms. Talk with your health care provider about your test results, treatment options, and if necessary, the need for more tests. Vaccines  Your health care provider may recommend certain vaccines, such as:  Influenza vaccine. This is  recommended every year.  Tetanus, diphtheria, and acellular pertussis (Tdap, Td) vaccine. You may need a Td booster every 10 years.  Zoster vaccine. You may need this after age 66.  Pneumococcal 13-valent conjugate (PCV13) vaccine. One dose is recommended after age 6.  Pneumococcal polysaccharide (PPSV23) vaccine. One dose is recommended after age 69. Talk to your health care provider about which screenings and vaccines you need and how often you need them. This information is not intended to replace advice given to you by your health care provider. Make sure you discuss any questions you have with your health care provider. Document Released: 12/01/2015 Document Revised: 07/24/2016 Document Reviewed: 09/05/2015 Elsevier Interactive Patient Education  2017 [place] Prevention in the Home Falls can cause injuries. They can happen to people of all ages. There are many things you can do to make your home safe and to help prevent falls. What can I do on the outside of my home?  Regularly fix the edges of walkways and driveways and fix any cracks.  Remove anything that might make you trip as you walk through a door, such as a raised step or threshold.  Trim any bushes or trees on the path to your home.  Use bright outdoor lighting.  Clear any walking paths of anything that might make someone trip, such as rocks or tools.  Regularly check to see if handrails are loose or broken. Make sure that both sides of any steps have handrails.  Any raised decks and porches should have guardrails on the edges.  Have any leaves, snow, or ice cleared regularly.  Use sand or salt on walking paths during winter.  Clean up any spills in your garage right away. This includes oil or grease spills. What can I do in the bathroom?  Use night lights.  Install grab bars by the toilet and in the tub and shower. Do not use towel bars as grab bars.  Use non-skid mats or decals in the tub or  shower.  If you need to sit down in the shower, use a plastic, non-slip stool.  Keep the floor dry. Clean up any water that spills on the floor as soon as it happens.  Remove soap buildup in the tub or shower regularly.  Attach bath mats securely with double-sided non-slip rug tape.  Do not have throw rugs and other things on the floor that can make you trip. What can I do in the bedroom?  Use night lights.  Make sure that you have a light by your bed that is easy to reach.  Do not use any sheets or blankets that are too big for your bed. They should not hang down onto the floor.  Have a firm chair that has side arms. You can use this for support while you get dressed.  Do not have throw rugs and other things on the floor that can make you trip. What can I do in the kitchen?  Clean up any spills right away.  Avoid walking on wet floors.  Keep items that you use a lot in easy-to-reach places.  If you need to reach something above you, use a strong step stool that has a grab bar.  Keep electrical cords out of the way.  Do not use floor polish or wax that makes floors slippery. If you must use wax, use non-skid floor wax.  Do not have throw rugs and other things on the floor that can make you trip. What can I do with my stairs?  Do not leave any items on the stairs.  Make sure that there are handrails on both sides of the stairs and use them. Fix handrails that are broken or loose. Make sure that handrails are as long as the stairways.  Check any carpeting to make sure that it is firmly attached to the stairs. Fix any carpet that is loose or worn.  Avoid having throw rugs at the top or bottom of the stairs. If you do have throw rugs, attach them to the floor with carpet tape.  Make sure that you have a light switch at the top of the stairs and the bottom of the stairs. If you do not have them, ask someone to add them for you. What else can I do to help prevent  falls?  Wear shoes that:  Do not have high heels.  Have rubber bottoms.  Are comfortable and fit you well.  Are closed at the toe. Do not wear sandals.  If you use a stepladder:  Make sure that it is fully opened. Do not climb a closed stepladder.  Make sure that both sides of the stepladder are locked into place.  Ask someone to hold it for you, if possible.  Clearly mark and make sure that you can see:  Any grab bars or handrails.  First and last steps.  Where the edge of each step is.  Use tools that help you move around (mobility aids) if they are needed. These include:  Canes.  Walkers.  Scooters.  Crutches.  Turn on the lights when you go into a dark area. Replace any light bulbs as soon as they burn out.  Set up your furniture so you have a clear path. Avoid moving your furniture around.  If any of your floors are uneven, fix them.  If there are any pets around you, be aware of where they are.  Review your medicines with your doctor. Some medicines can make you feel dizzy. This can increase your chance of falling. Ask your doctor what other things that you can do to help prevent falls. This information is not intended to replace advice given to you by your health care provider. Make sure you discuss any questions you have with your health care provider. Document Released: 08/31/2009 Document Revised: 04/11/2016 Document Reviewed: 12/09/2014 Elsevier Interactive Patient Education  2017 [employer].

## 2020-09-21 NOTE — Progress Notes (Signed)
Virtual Visit via Telephone Note  I connected with  Teresa Barnes on 09/21/20 at  1:00 PM EDT by telephone and verified that I am speaking with the correct person using two identifiers.  Medicare Annual Wellness visit completed telephonically due to Covid-19 pandemic.   Persons participating in this call: This Health Coach and this patient.   Location: Patient: Home Provider: Office   I discussed the limitations, risks, security and privacy concerns of performing an evaluation and management service by telephone and the availability of in person appointments. The patient expressed understanding and agreed to proceed.  Unable to perform video visit due to video visit attempted and failed and/or patient does not have video capability.   Some vital signs may be absent or patient reported.   Willette Brace, LPN    Subjective:   Teresa Barnes is a 74 y.o. female who presents for Medicare Annual (Subsequent) preventive examination.  Review of Systems     Cardiac Risk Factors include: advanced age (>75men, >70 women);hypertension;diabetes mellitus;dyslipidemia;obesity (BMI >30kg/m2)     Objective:    There were no vitals filed for this visit. There is no height or weight on file to calculate BMI.  Advanced Directives 09/21/2020 07/19/2019 03/29/2019 03/26/2019 03/24/2019 07/15/2018 02/11/2017  Does Patient Have a Medical Advance Directive? No No No No No No No  Type of Advance Directive - - - - - - -  Does patient want to make changes to medical advance directive? No - Patient declined - - - - - -  Would patient like information on creating a medical advance directive? - No - Patient declined No - Patient declined No - Patient declined - No - Patient declined -    Current Medications (verified) Outpatient Encounter Medications as of 09/21/2020  Medication Sig  . dorzolamide-timolol (COSOPT) 22.3-6.8 MG/ML ophthalmic solution   . Ferrous Sulfate Dried (SLOW IRON PO) Take by mouth.  .  hydrALAZINE (APRESOLINE) 25 MG tablet Take 12.5 mg by mouth in the morning and at bedtime.  Marland Kitchen latanoprost (XALATAN) 0.005 % ophthalmic solution SMARTSIG:1 Drop(s) In Eye(s) Every Evening  . NOVOFINE PLUS 32G X 4 MM MISC CHECK SUGARS ONCE PER DAY  . omeprazole (PRILOSEC) 40 MG capsule Take 1 capsule (40 mg total) by mouth daily.  . rosuvastatin (CRESTOR) 20 MG tablet Take 1 tablet by mouth once daily  . VICTOZA 18 MG/3ML SOPN INJECT 1.8 MG INTO THE SKIN DAILY  . vitamin B-12 (CYANOCOBALAMIN) 500 MCG tablet Take 500 mcg by mouth daily.  . Vitamin D, Cholecalciferol, 25 MCG (1000 UT) TABS   . [DISCONTINUED] allopurinol (ZYLOPRIM) 300 MG tablet Take 1 tablet (300 mg total) by mouth daily. (Patient not taking: Reported on 09/21/2020)  . [DISCONTINUED] bimatoprost (LUMIGAN) 0.01 % SOLN Place 1 drop into both eyes at bedtime.  (Patient not taking: Reported on 09/21/2020)  . [DISCONTINUED] cephALEXin (KEFLEX) 500 MG capsule Take 500 mg by mouth 2 (two) times daily. (Patient not taking: Reported on 09/21/2020)  . [DISCONTINUED] COMBIGAN 0.2-0.5 % ophthalmic solution Place 1 drop into the left eye 2 (two) times a day.  (Patient not taking: Reported on 09/21/2020)  . [DISCONTINUED] folic acid (FOLVITE) 1 MG tablet Take 1 tablet (1 mg total) by mouth daily. (Patient not taking: Reported on 09/21/2020)   No facility-administered encounter medications on file as of 09/21/2020.    Allergies (verified) Prednisone and Tramadol   History: Past Medical History:  Diagnosis Date  . Anemia   . Anemia  of chronic disease 09/24/2018  . Anxiety   . Blood transfusion without reported diagnosis 07/2016  . Cancer ([facility]) 1991   breast  . Chronic gout 12/09/2017   On allopurinol  . Diabetes mellitus without complication ([place])   . Esophageal stricture 12/09/2017   S/p dilation by GI: Dr. Scarlette Shorts; due to h/o severe recurrent erosive esophagitis.  . 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  GERD (gastroesophageal reflux disease)   . Glaucoma   . Hiatal hernia 12/09/2017  . Hyperlipidemia   . Hypertension   . Right rotator cuff tear 11/24/2015   Injected this and 26 2017  . Wears glasses    Past Surgical History:  Procedure Laterality Date  . ANTERIOR CERVICAL DECOMP/DISCECTOMY FUSION N/A 03/26/2019   Procedure: Anterior Cervical Discectomy Fusion - Cervical Three-Cervical Four - Cervical Four-Cervical Five - Cervical Five-Cervical Six;  Surgeon: Eustace Moore, MD;  Location: [facility];  Service: Neurosurgery;  Laterality: N/A;  Anterior Cervical Discectomy Fusion - Cervical Three-Cervical Four - Cervical Four-Cervical Five - Cervical Five-Cervical Six  . CATARACT EXTRACTION W/ INTRAOCULAR LENS  IMPLANT, BILATERAL  2000  . COLONOSCOPY    . cyst on spine    . ESOPHAGOGASTRODUODENOSCOPY (EGD) WITH PROPOFOL N/A 07/31/2016   Procedure: ESOPHAGOGASTRODUODENOSCOPY (EGD) WITH PROPOFOL;  Surgeon: Manus Gunning, MD;  Location: [facility];  Service: Gastroenterology;  Laterality: N/A;  . EYE SURGERY  2011   lt eye blled  . MASTECTOMY MODIFIED RADICAL Right 1990  . REDUCTION MAMMAPLASTY Left   . TRIGGER FINGER RELEASE Right 06/24/2013   Procedure: RELEASE TRIGGER FINGER/A-1 PULLEY PIP RIGHT LONG FINGER;  Surgeon: Cammie Sickle., MD;  Location: [facility];  Service: Orthopedics;  Laterality: Right;  . TRIGGER FINGER RELEASE    . TUBAL LIGATION     Family History  Problem Relation Age of Onset  . Diabetes Mother   . Diabetes Daughter   . Arthritis Daughter   . Hyperlipidemia Daughter   . Diabetes Son   . Arthritis Son   . Diabetes Sister   . Colon cancer Neg Hx   . Cancer Neg Hx   . Heart disease Neg Hx   . Breast cancer Neg Hx    Social History   Socioeconomic History  . Marital status: Married    Spouse name: robert  . Number of children: 2  . Years of education: Not on file  . Highest education level: Not on file  Occupational History  .  Occupation: retired Educational psychologist; [place]: 2006  Tobacco Use  . Smoking status: Former Smoker    Packs/day: 0.25    Years: 10.00    Pack years: 2.50    Types: Cigarettes    Quit date: 06/18/1993    Years since quitting: 27.2  . Smokeless tobacco: Never Used  Vaping Use  . Vaping Use: Never used  Substance and Sexual Activity  . Alcohol use: No  . Drug use: No  . Sexual activity: Never  Other Topics Concern  . Not on file  Social History Narrative   Originally from [place], [place]; worked in [employer] for [employer]; longevity in family   Social Determinants of Radio broadcast assistant Strain:   . Difficulty of Paying Living Expenses: Not on file  Food Insecurity:   . Worried About Charity fundraiser in the Last Year: Not on file  . Ran Out of Food in  the Last Year: Not on file  Transportation Needs:   . Lack of Transportation (Medical): Not on file  . Lack of Transportation (Non-Medical): Not on file  Physical Activity:   . Days of Exercise per Week: Not on file  . Minutes of Exercise per Session: Not on file  Stress:   . Feeling of Stress : Not on file  Social Connections:   . Frequency of Communication with Friends and Family: Not on file  . Frequency of Social Gatherings with Friends and Family: Not on file  . Attends Religious Services: Not on file  . Active Member of Clubs or Organizations: Not on file  . Attends Archivist Meetings: Not on file  . Marital Status: Not on file    Tobacco Counseling Counseling given: Not Answered   Clinical Intake:  Pre-visit preparation completed: Yes  Pain : No/denies pain     BMI - recorded: 30 Nutritional Status: BMI > 30  Obese Nutritional Risks: None Diabetes: Yes CBG done?: No CBG resulted in Enter/ Edit results?: No Did pt. bring in CBG monitor from home?: No  How often do you need to have someone help you when you read instructions, pamphlets, or other written materials from your doctor  or pharmacy?: 1 - Never  Diabetic?Nutrition Risk Assessment:  Has the patient had any N/V/D within the last 2 months?  No  Does the patient have any non-healing wounds?  No  Has the patient had any unintentional weight loss or weight gain?  No   Diabetes:  Is the patient diabetic?  Yes  If diabetic, was a CBG obtained today?  No  Did the patient bring in their glucometer from home?  No  How often do you monitor your CBG's? Daily usually.   Financial Strains and Diabetes Management:  Are you having any financial strains with the device, your supplies or your medication? No .  Does the patient want to be seen by Chronic Care Management for management of their diabetes?  No  Would the patient like to be referred to a Nutritionist or for Diabetic Management?  No   Diabetic Exams:  Diabetic Eye Exam: Completed 07/19/20 Diabetic Foot Exam: Completed 05/03/20   Interpreter Needed?: No  Information entered by :: Charlott Rakes, LPN   Activities of Daily Living In your present state of health, do you have any difficulty performing the following activities: 09/21/2020  Hearing? N  Vision? N  Difficulty concentrating or making decisions? N  Walking or climbing stairs? N  Dressing or bathing? N  Comment assist by husband for saftey  Doing errands, shopping? N  Preparing Food and eating ? N  Comment husband does cooking at this time  Using the Toilet? N  In the past six months, have you accidently leaked urine? N  Do you have problems with loss of bowel control? N  Managing your Medications? N  Managing your Finances? N  Housekeeping or managing your Housekeeping? N  Some recent data might be hidden    Patient Care Team: Leamon Arnt, MD as PCP - General (Family Medicine) Lyndal Pulley, DO as Consulting Physician (Family Medicine) Irene Shipper, MD as Consulting Physician (Gastroenterology) Hayden Pedro, MD as Consulting Physician (Ophthalmology) Marzetta Board,  DPM as Consulting Physician (Podiatry) Eustace Moore, MD as Consulting Physician (Neurosurgery) Andee Lineman, NP as Nurse Practitioner (Nephrology)  Indicate any recent Medical Services you may have received from other than [facility] providers in the  past year (date may be approximate).     Assessment:   This is a routine wellness examination for Teresa Barnes.  Hearing/Vision screen  Hearing Screening   125Hz  250Hz  500Hz  1000Hz  2000Hz  3000Hz  4000Hz  6000Hz  8000Hz   Right ear:           Left ear:           Comments: Pt denies any difficulty hearing   Vision Screening Comments: Follows up with Dr Alanda Slim and Dr Tempie Hoist for annual eye exams  Dietary issues and exercise activities discussed: Current Exercise Habits: The patient does not participate in regular exercise at present, Exercise limited by: Other - see comments (just had spine surgery)  Goals    . Patient Stated     Get walking back like it was    . Weight (lb) < 165 lb (74.8 kg)     Lose weight by watching calorie intake.       Depression Screen PHQ 2/9 Scores 09/21/2020 07/19/2019 04/21/2019 07/15/2018 03/09/2018 12/09/2017  PHQ - 2 Score 0 0 0 0 0 0  PHQ- 9 Score - - - - 0 -    Fall Risk Fall Risk  09/21/2020 07/19/2019 04/21/2019 07/15/2018 12/09/2017  Falls in the past year? 0 0 0 No No  Number falls in past yr: 0 0 - - -  Injury with Fall? 0 0 - - -  Risk for fall due to : Impaired mobility;Impaired vision Impaired balance/gait;Impaired mobility - - -  Follow up Falls prevention discussed Education provided - - -    Any stairs in or around the home? Yes  If so, are there any without handrails? No  Home free of loose throw rugs in walkways, pet beds, electrical cords, etc? Yes  Adequate lighting in your home to reduce risk of falls? Yes   ASSISTIVE DEVICES UTILIZED TO PREVENT FALLS:  Life alert? No  Use of a cane, walker or w/c? Yes  Grab bars in the bathroom? Yes  Shower chair or bench in shower? Yes  Elevated  toilet seat or a handicapped toilet? Yes   TIMED UP AND GO:  Was the test performed? No .     Cognitive Function: MMSE - Mini Mental State Exam 07/15/2018  Orientation to time 5  Orientation to Place 5  Registration 3  Attention/ Calculation 5  Recall 0  Language- name 2 objects 2  Language- repeat 1  Language- follow 3 step command 3  Language- read & follow direction 1  Write a sentence 1  Copy design 1  Total score 27     6CIT Screen 09/21/2020  What Year? 0 points  What month? 0 points  Count back from 20 0 points  Months in reverse 0 points  Repeat phrase 0 points    Immunizations Immunization History  Administered Date(s) Administered  . Fluad Quad(high Dose 65+) 08/18/2019  . Influenza, High Dose Seasonal PF 08/18/2017, 07/15/2018, 08/14/2020  . Influenza,inj,Quad PF,6+ Mos 08/01/2016  . Influenza-Unspecified 08/18/2014  . Moderna SARS-COVID-2 Vaccination 01/01/2020, 01/29/2020  . Pneumococcal Conjugate-13 12/09/2017  . Pneumococcal Polysaccharide-23 09/24/2018  . Zoster Recombinat (Shingrix) 10/21/2018, 01/27/2019    TDAP status: Due, Education has been provided regarding the importance of this vaccine. Advised may receive this vaccine at local pharmacy or Health Dept. Aware to provide a copy of the vaccination record if obtained from local pharmacy or Health Dept. Verbalized acceptance and understanding.   Flu Vaccine status: Up to date   Pneumococcal vaccine status: Up  to date   Covid-19 vaccine status: Completed vaccines  Qualifies for Shingles Vaccine? Yes   Zostavax completed Yes   Shingrix Completed?: Yes  Screening Tests Health Maintenance  Topic Date Due  . MAMMOGRAM  10/04/2020  . HEMOGLOBIN A1C  02/06/2021  . FOOT EXAM  05/03/2021  . OPHTHALMOLOGY EXAM  07/19/2021  . DEXA SCAN  02/17/2023  . INFLUENZA VACCINE  Completed  . COVID-19 Vaccine  Completed  . Hepatitis C Screening  Completed  . PNA vac Low Risk Adult  Completed     Health Maintenance  There are no preventive care reminders to display for this patient.  Colorectal cancer screening: Completed 06/15/14. Repeat every 10 years Mammogram status: Completed 10/11/19. Repeat every year Bone Density status: Completed 02/16/18. Results reflect: Bone density results: NORMAL. Repeat every 5 years.   Additional Screening:  Hepatitis C Screening:Completed 12/09/17  Vision Screening: Recommended annual ophthalmology exams for early detection of glaucoma and other disorders of the eye. Is the patient up to date with their annual eye exam?  Yes  Who is the provider or what is the name of the office in which the patient attends annual eye exams? Dr Alanda Slim and Dr Zigmund Daniel    Dental Screening: Recommended annual dental exams for proper oral hygiene  Community Resource Referral / Chronic Care Management: CRR required this visit?  No   CCM required this visit?  No      Plan:     I have personally reviewed and noted the following in the patient's chart:   . Medical and social history . Use of alcohol, tobacco or illicit drugs  . Current medications and supplements . Functional ability and status . Nutritional status . Physical activity . Advanced directives . List of other physicians . Hospitalizations, surgeries, and ER visits in previous 12 months . Vitals . Screenings to include cognitive, depression, and falls . Referrals and appointments  In addition, I have reviewed and discussed with patient certain preventive protocols, quality metrics, and best practice recommendations. A written personalized care plan for preventive services as well as general preventive health recommendations were provided to patient.     Willette Brace, LPN   21/12/2480   Nurse Notes: Pt is requesting in home PT twice a week . Pt also stated when at [facility]  didn't complete an A1C her last A1C was 6.0 on 08/09/20.

## 2020-09-25 ENCOUNTER — Other Ambulatory Visit: Payer: Self-pay

## 2020-09-25 DIAGNOSIS — R269 Unspecified abnormalities of gait and mobility: Secondary | ICD-10-CM

## 2020-09-25 DIAGNOSIS — M6281 Muscle weakness (generalized): Secondary | ICD-10-CM

## 2020-11-01 DIAGNOSIS — I129 Hypertensive chronic kidney disease with stage 1 through stage 4 chronic kidney disease, or unspecified chronic kidney disease: Secondary | ICD-10-CM | POA: Diagnosis not present

## 2020-11-01 DIAGNOSIS — D638 Anemia in other chronic diseases classified elsewhere: Secondary | ICD-10-CM | POA: Diagnosis not present

## 2020-11-01 DIAGNOSIS — N2581 Secondary hyperparathyroidism of renal origin: Secondary | ICD-10-CM | POA: Diagnosis not present

## 2020-11-01 DIAGNOSIS — N184 Chronic kidney disease, stage 4 (severe): Secondary | ICD-10-CM | POA: Diagnosis not present

## 2020-11-01 DIAGNOSIS — R3129 Other microscopic hematuria: Secondary | ICD-10-CM | POA: Diagnosis not present

## 2020-11-01 DIAGNOSIS — E1122 Type 2 diabetes mellitus with diabetic chronic kidney disease: Secondary | ICD-10-CM | POA: Diagnosis not present

## 2020-11-02 ENCOUNTER — Other Ambulatory Visit: Payer: Self-pay | Admitting: Family Medicine

## 2020-11-02 DIAGNOSIS — N390 Urinary tract infection, site not specified: Secondary | ICD-10-CM | POA: Diagnosis not present

## 2020-11-16 DIAGNOSIS — N184 Chronic kidney disease, stage 4 (severe): Secondary | ICD-10-CM | POA: Diagnosis not present

## 2020-11-20 ENCOUNTER — Ambulatory Visit: Payer: Medicare Other | Admitting: Internal Medicine

## 2020-11-21 ENCOUNTER — Encounter: Payer: Self-pay | Admitting: Physical Therapy

## 2020-11-21 ENCOUNTER — Ambulatory Visit: Payer: Medicare Other | Attending: Family Medicine | Admitting: Physical Therapy

## 2020-11-21 ENCOUNTER — Other Ambulatory Visit: Payer: Self-pay

## 2020-11-21 DIAGNOSIS — M6281 Muscle weakness (generalized): Secondary | ICD-10-CM | POA: Diagnosis not present

## 2020-11-21 DIAGNOSIS — G959 Disease of spinal cord, unspecified: Secondary | ICD-10-CM | POA: Insufficient documentation

## 2020-11-21 DIAGNOSIS — R262 Difficulty in walking, not elsewhere classified: Secondary | ICD-10-CM | POA: Insufficient documentation

## 2020-11-21 NOTE — Therapy (Signed)
[facility] [address], [place], [place], [address] Phone: [phone]   Fax:  [phone]  Physical Therapy Evaluation  Patient Details  Name: Teresa Barnes MRN: [id number] Date of Birth: 1946/06/05 Referring Provider (PT): Dr. Billey Chang   Encounter Date: 11/21/2020   PT End of Session - 11/21/20 1940    Visit Number 1    Date for PT Re-Evaluation 01/16/21    Authorization Type UHC  Medicare    PT Start Time 1408   pt late   PT Stop Time 1445    PT Time Calculation (min) 37 min    Activity Tolerance Patient tolerated treatment well           Past Medical History:  Diagnosis Date  . Anemia   . Anemia of chronic disease 09/24/2018  . Anxiety   . Blood transfusion without reported diagnosis 07/2016  . Cancer ([place]) 1991   breast  . Chronic gout 12/09/2017   On allopurinol  . Diabetes mellitus without complication ([place])   . Esophageal stricture 12/09/2017   S/p dilation by GI: Dr. Scarlette Shorts; due to h/o severe recurrent erosive esophagitis.  . 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 GERD (gastroesophageal reflux disease)   . Glaucoma   . Hiatal hernia 12/09/2017  . Hyperlipidemia   . Hypertension   . Right rotator cuff tear 11/24/2015   Injected this and 26 2017  . Wears glasses     Past Surgical History:  Procedure Laterality Date  . ANTERIOR CERVICAL DECOMP/DISCECTOMY FUSION N/A 03/26/2019   Procedure: Anterior Cervical Discectomy Fusion - Cervical Three-Cervical Four - Cervical Four-Cervical Five - Cervical Five-Cervical Six;  Surgeon: Eustace Moore, MD;  Location: [facility];  Service: Neurosurgery;  Laterality: N/A;  Anterior Cervical Discectomy Fusion - Cervical Three-Cervical Four - Cervical Four-Cervical Five - Cervical Five-Cervical Six  . CATARACT EXTRACTION W/ INTRAOCULAR LENS  IMPLANT, BILATERAL  2000  . COLONOSCOPY    . cyst on spine    .  ESOPHAGOGASTRODUODENOSCOPY (EGD) WITH PROPOFOL N/A 07/31/2016   Procedure: ESOPHAGOGASTRODUODENOSCOPY (EGD) WITH PROPOFOL;  Surgeon: Manus Gunning, MD;  [facility];  Service: Gastroenterology;  Laterality: N/A;  . EYE SURGERY  2011   lt eye blled  . MASTECTOMY MODIFIED RADICAL Right 1990  . REDUCTION MAMMAPLASTY Left   . TRIGGER FINGER RELEASE Right 06/24/2013   Procedure: RELEASE TRIGGER FINGER/A-1 PULLEY PIP RIGHT LONG FINGER;  Surgeon: Cammie Sickle., MD;  Location: [facility];  Service: Orthopedics;  Laterality: Right;  . TRIGGER FINGER RELEASE    . TUBAL LIGATION      There were no vitals filed for this visit.    Subjective Assessment - 11/21/20 1415    Subjective 4- 5 years ago stumbled in yard and fell.  3 spurs in neck pressing on spinal cord.  Surgery 1 year ago.  Using a RW full time.    Pertinent History LBP;  cervical fusion for myelopathy 03/26/2019    Limitations Walking;House hold activities    How long can you sit comfortably? 1- 2 hours    How long can you stand comfortably? > 15-20 minutes for cooking    How long can you walk comfortably? with RW around house to car in driveway;  uses motorized scooter in grocery store    Patient Stated Goals I want to get right leg not be stiff so I can walk;  do 3 steps in garage with ease;  get over fear;  walk with a quad cane    Currently in Pain? No/denies              [facility] PT Assessment - 11/21/20 0001      Assessment   Medical Diagnosis gait abnormality; decreased strength    Referring Provider (PT) Dr. Billey Chang    Onset Date/Surgical Date --   > 1 year   Hand Dominance Right    Next MD Visit as needed    Prior Therapy for shoulder      Precautions   Precautions Fall      Restrictions   Weight Bearing Restrictions No      Balance Screen   Has the patient fallen in the past 6 months No    Has the patient had a decrease in activity level because of a fear of falling?  Yes     Is the patient reluctant to leave their home because of a fear of falling?  Yes      [place] Private residence    Living Arrangements Spouse/significant other    Type of [place] to enter    Entrance Stairs-Number of Steps 3    [place] Two level;Able to live on main level with bedroom/bathroom    [place] - 4 wheels;Cane - quad      Prior Function   Level of Independence Independent with household mobility with device    Leisure being at home; play checkers; I love to cook      AROM   Overall AROM Comments bil shoulder elevation 90 degrees (right shoulder stiffer than left)    Lumbar Flexion finger tips to mid shin      Strength   Overall Strength Comments pt requires moderate UE assist to rise from a chair    Right Hip Flexion 3+/5    Left Hip Flexion 4-/5    Right Knee Flexion 4-/5    Right Knee Extension 3+/5    Left Knee Flexion 4-/5    Left Knee Extension 3+/5      Ambulation/Gait   Ambulation/Gait Yes    Ambulation/Gait Assistance 6: Modified independent (Device/Increase time)    Ambulation Distance (Feet) 60 Feet    Assistive device 4-wheeled walker    Gait Pattern Decreased stride length;Decreased hip/knee flexion - right;Decreased hip/knee flexion - left    Gait Comments without RW 12 feet with CGAx2      Standardized Balance Assessment   Five times sit to stand comments  with hands assist 23.67      Berg Balance Test   Sit to Stand Able to stand  independently using hands    Standing Unsupported Able to stand 2 minutes with supervision    Sitting with Back Unsupported but Feet Supported on Floor or Stool Able to sit safely and securely 2 minutes    Stand to Sit Controls descent by using hands    Transfers Able to transfer safely, definite need of hands    Standing Unsupported with Eyes Closed Able to stand 10 seconds with supervision    Standing Unsupported with Feet Together Needs help  to attain position but able to stand for 30 seconds with feet together    From Standing, Reach Forward with Outstretched Arm Reaches forward but needs supervision    From Standing Position, Pick up Object from Floor Unable to pick  up shoe, but reaches 2-5 cm (1-2") from shoe and balances independently    From Standing Position, Turn to Look Behind Over each Shoulder Looks behind one side only/other side shows less weight shift    Turn 360 Degrees Able to turn 360 degrees safely but slowly    Standing Unsupported, Alternately Place Feet on Step/Stool Able to complete >2 steps/needs minimal assist    Standing Unsupported, One Foot in Front Needs help to step but can hold 15 seconds    Standing on One Leg Unable to try or needs assist to prevent fall    Total Score 30      Timed Up and Go Test   Normal TUG (seconds) 29.88   with RW   TUG Comments 42.51 without RW                      Objective measurements completed on examination: See above findings.                 PT Short Term Goals - 11/21/20 1957      PT SHORT TERM GOAL #1   Title independent with initial HEP    Time 4    Period Weeks    Status New    Target Date 12/19/20      PT SHORT TERM GOAL #2   Title The patient will have improved strength indicated by improved 5x sit to stand time (light UE assist) 21 sec    Time 4    Period Weeks    Status New      PT SHORT TERM GOAL #3   Title Gait with QC 80 feet with CGA/SBA    Time 4    Period Weeks    Status New      PT SHORT TERM GOAL #4   Title Timed up and Go time improved to 25 sec with RW and 32 sec with QC    Time 4    Period Weeks    Status New      PT SHORT TERM GOAL #5   Title Gait with RW 300 feet with SBA    Time 4    Period Weeks    Status New             PT Long Term Goals - 11/21/20 2001      PT LONG TERM GOAL #1   Title independent with HEP    Time 8    Period Weeks    Status New    Target Date 01/16/21      PT  LONG TERM GOAL #2   Title Gait with QC 150 feet needed for household ambulation    Time 8    Period Weeks    Status New      PT LONG TERM GOAL #3   Title go up and down steps with step over step using a railing due to right hip and knee strength >/= 4/5    Time 8    Period Weeks    Status New      PT LONG TERM GOAL #4   Title BERG balance score improved to 40/56 indicating decreased risk of falls    Period Weeks    Status New      PT LONG TERM GOAL #5   Title Gait with RW 500 feet needed for medium community distances    Time 8    Period Weeks    Status New  PT LONG TERM GOAL #6   Title Timed up and Go with least restrictive device 20 sec or less    Time 8    Period Weeks    Status New      PT LONG TERM GOAL #7   Title LE strength improved needed to rise from a standard chair without UE assist 1x    Time 8    Period Weeks    Status New                  Plan - 11/21/20 1941    Clinical Impression Statement The patient presents using a RW and reports of difficulty walking since a fall a few years ago and then cervical fusion surgery for cervical myelopathy on 03/26/2019.  She reports since that time she has been using a RW full time with max walking distance from her house to the car.  Her goal is to walk with a QC instead of the walker.   She uses a motorized cart for grocery shopping.  She is very fearful of falling.  She is able to walk a very short distance today without an assistive device with CGA at a slow speed.   Decreased stride length and lack of full knee extension during gait.  She requires significant UE assist to rise from the chair.  Significantly impaired gait speed with Timed up and Go.  BERG score is 30/56 indicating a 100% risk of falls.  Limited UE ROM to 90 degrees elevation.  Right > left LE weakness.  She would benefit from PT to address these deficits.    Personal Factors and Comorbidities Age;Fitness;Past/Current Experience;Comorbidity  1;Comorbidity 2;Comorbidity 3+;Time since onset of injury/illness/exacerbation    Comorbidities cervical myelopathy; OA multiple joints especially right shoulder; diabetes; kidney disease    Examination-Activity Limitations Locomotion Level;Transfers;Reach Overhead;Carry;Squat;Stairs;Lift;Stand    Examination-Participation Restrictions Meal Prep;Cleaning;Community Activity;Driving;Shop;Laundry    Stability/Clinical Decision Making Evolving/Moderate complexity    Clinical Decision Making Moderate    Rehab Potential Good    PT Frequency 2x / week    PT Duration 8 weeks    PT Treatment/Interventions ADLs/Self Care Home Management;Aquatic Therapy;Cryotherapy;Electrical Stimulation;Moist Heat;Gait training;Stair training;Functional mobility training;Therapeutic activities;Therapeutic exercise;Balance training;Neuromuscular re-education;Manual techniques;Patient/family education;Taping    PT Next Visit Plan instruct in basic HEP band ex's in sitting; gait with QC or without device with gait belt;  LE strengthening in sitting and/or low level standing ex's;  pt very fearful of falling but excited about working on walking    PT Home Exercise Plan start next visit    Consulted and Agree with Plan of Care Patient           Patient will benefit from skilled therapeutic intervention in order to improve the following deficits and impairments:  Abnormal gait,Decreased range of motion,Difficulty walking,Decreased endurance,Cardiopulmonary status limiting activity,Impaired perceived functional ability,Pain,Decreased balance,Decreased strength  Visit Diagnosis: Cervical myelopathy ([place]) - Plan: PT plan of care cert/re-cert  Muscle weakness (generalized) - Plan: PT plan of care cert/re-cert  Difficulty in walking, not elsewhere classified - Plan: PT plan of care cert/re-cert     Problem List Patient Active Problem List   Diagnosis Date Noted  . Type 2 diabetes mellitus with stage 4 chronic kidney  disease, without long-term current use of insulin ([place]) 08/09/2020  . Dyslipidemia 08/09/2020  . CKD (chronic kidney disease) stage 4, GFR 15-29 ml/min ([facility]) 05/31/2020  . Acute bursitis of right shoulder 06/03/2019  . S/P cervical spinal fusion 03/26/2019  . Anemia  of chronic disease 09/24/2018  . Adhesive bursitis of left shoulder 08/05/2018  . History of right breast cancer 12/09/2017  . Mixed hyperlipidemia 12/09/2017  . Hypertension associated with diabetes ([place]) 12/09/2017  . Hiatal hernia 12/09/2017  . Esophageal stricture 12/09/2017  . Chronic gout 12/09/2017  . Glaucoma 12/09/2017  . Greater trochanteric bursitis of right hip 11/17/2017  . Degenerative arthritis of right knee 07/03/2017  . Normochromic anemia   . H/O: upper GI bleed 07/30/2016  . Right lumbar radiculopathy 01/23/2016  . Arthritis of right acromioclavicular joint 01/02/2016  . Controlled type 2 diabetes mellitus without complication, without long-term current use of insulin ([place]) 04/03/2009   Ruben Im, PT 11/21/20 8:10 PM Phone: [phone] Fax: [phone] Alvera Singh 11/21/2020, 8:09 PM  [facility] [address], [place], [place], [address] Phone: [phone]   Fax:  [phone]  Name: Teresa Barnes MRN: [id number] Date of Birth: 1946/10/15

## 2020-11-27 ENCOUNTER — Other Ambulatory Visit: Payer: Self-pay

## 2020-11-27 ENCOUNTER — Encounter: Payer: Self-pay | Admitting: Physical Therapy

## 2020-11-27 ENCOUNTER — Ambulatory Visit: Payer: Medicare Other | Admitting: Physical Therapy

## 2020-11-27 DIAGNOSIS — R262 Difficulty in walking, not elsewhere classified: Secondary | ICD-10-CM | POA: Diagnosis not present

## 2020-11-27 DIAGNOSIS — G959 Disease of spinal cord, unspecified: Secondary | ICD-10-CM

## 2020-11-27 DIAGNOSIS — M6281 Muscle weakness (generalized): Secondary | ICD-10-CM | POA: Diagnosis not present

## 2020-11-27 NOTE — Therapy (Signed)
[facility] [address] [place], [place], [address] Phone: [phone]   Fax:  [phone]  Physical Therapy Treatment  Patient Details  Name: Teresa Barnes MRN: [id number] Date of Birth: Oct 06, 1946 Referring Provider (PT): Dr. Billey Chang   Encounter Date: 11/27/2020   PT End of Session - 11/27/20 1532    Visit Number 2    Date for PT Re-Evaluation 01/16/21    Authorization Type UHC  Medicare    PT Start Time 1528    PT Stop Time 1610    PT Time Calculation (min) 42 min    Activity Tolerance Patient tolerated treatment well    Behavior During Therapy [facility] for tasks assessed/performed           Past Medical History:  Diagnosis Date  . Anemia   . Anemia of chronic disease 09/24/2018  . Anxiety   . Blood transfusion without reported diagnosis 07/2016  . Cancer ([place]) 1991   breast  . Chronic gout 12/09/2017   On allopurinol  . Diabetes mellitus without complication ([place])   . Esophageal stricture 12/09/2017   S/p dilation by GI: Dr. Scarlette Shorts; due to h/o severe recurrent erosive esophagitis.  . 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 GERD (gastroesophageal reflux disease)   . Glaucoma   . Hiatal hernia 12/09/2017  . Hyperlipidemia   . Hypertension   . Right rotator cuff tear 11/24/2015   Injected this and 26 2017  . Wears glasses     Past Surgical History:  Procedure Laterality Date  . ANTERIOR CERVICAL DECOMP/DISCECTOMY FUSION N/A 03/26/2019   Procedure: Anterior Cervical Discectomy Fusion - Cervical Three-Cervical Four - Cervical Four-Cervical Five - Cervical Five-Cervical Six;  Surgeon: Eustace Moore, MD;  Location: [facility];  Service: Neurosurgery;  Laterality: N/A;  Anterior Cervical Discectomy Fusion - Cervical Three-Cervical Four - Cervical Four-Cervical Five - Cervical Five-Cervical Six  . CATARACT EXTRACTION W/ INTRAOCULAR LENS  IMPLANT, BILATERAL  2000  .  COLONOSCOPY    . cyst on spine    . ESOPHAGOGASTRODUODENOSCOPY (EGD) WITH PROPOFOL N/A 07/31/2016   Procedure: ESOPHAGOGASTRODUODENOSCOPY (EGD) WITH PROPOFOL;  Surgeon: Manus Gunning, MD;  [facility];  Service: Gastroenterology;  Laterality: N/A;  . EYE SURGERY  2011   lt eye blled  . MASTECTOMY MODIFIED RADICAL Right 1990  . REDUCTION MAMMAPLASTY Left   . TRIGGER FINGER RELEASE Right 06/24/2013   Procedure: RELEASE TRIGGER FINGER/A-1 PULLEY PIP RIGHT LONG FINGER;  Surgeon: Cammie Sickle., MD;  Location: [facility];  Service: Orthopedics;  Laterality: Right;  . TRIGGER FINGER RELEASE    . TUBAL LIGATION      There were no vitals filed for this visit.   Subjective Assessment - 11/27/20 1533    Subjective I can't wait to get rid of my walker.    Pertinent History LBP;  cervical fusion for myelopathy 03/26/2019    Patient Stated Goals I want to get right leg not be stiff so I can walk;  do 3 steps in garage with ease;  get over fear;  walk with a quad cane    Currently in Pain? No/denies    Multiple Pain Sites No                             [facility] Adult PT Treatment/Exercise - 11/27/20 0001  Ambulation/Gait   Ambulation/Gait Yes    Ambulation/Gait Assistance 6: Modified independent (Device/Increase time)    Ambulation Distance (Feet) 105 Feet    Assistive device Straight cane    Gait Pattern Decreased stride length;Decreased hip/knee flexion - right;Decreased hip/knee flexion - left      Knee/Hip Exercises: Aerobic   Nustep L1 x 5 min with PTA present to discuss current status and initiation of HEP today      Knee/Hip Exercises: Seated   Long Arc Quad AROM;Strengthening;Both;1 set;10 reps    Clamshell with TheraBand Red   2x10   Marching AROM;Strengthening;Both;1 set;10 reps                  PT Education - 11/27/20 1535    Education Details HEP    Person(s) Educated Patient    Methods  Explanation;Demonstration;Verbal cues;Handout    Comprehension Returned demonstration;Verbalized understanding            PT Short Term Goals - 11/21/20 1957      PT SHORT TERM GOAL #1   Title independent with initial HEP    Time 4    Period Weeks    Status New    Target Date 12/19/20      PT SHORT TERM GOAL #2   Title The patient will have improved strength indicated by improved 5x sit to stand time (light UE assist) 21 sec    Time 4    Period Weeks    Status New      PT SHORT TERM GOAL #3   Title Gait with QC 80 feet with CGA/SBA    Time 4    Period Weeks    Status New      PT SHORT TERM GOAL #4   Title Timed up and Go time improved to 25 sec with RW and 32 sec with QC    Time 4    Period Weeks    Status New      PT SHORT TERM GOAL #5   Title Gait with RW 300 feet with SBA    Time 4    Period Weeks    Status New             PT Long Term Goals - 11/21/20 2001      PT LONG TERM GOAL #1   Title independent with HEP    Time 8    Period Weeks    Status New    Target Date 01/16/21      PT LONG TERM GOAL #2   Title Gait with QC 150 feet needed for household ambulation    Time 8    Period Weeks    Status New      PT LONG TERM GOAL #3   Title go up and down steps with step over step using a railing due to right hip and knee strength >/= 4/5    Time 8    Period Weeks    Status New      PT LONG TERM GOAL #4   Title BERG balance score improved to 40/56 indicating decreased risk of falls    Period Weeks    Status New      PT LONG TERM GOAL #5   Title Gait with RW 500 feet needed for medium community distances    Time 8    Period Weeks    Status New      PT LONG TERM GOAL #6   Title Timed up and  Go with least restrictive device 20 sec or less    Time 8    Period Weeks    Status New      PT LONG TERM GOAL #7   Title LE strength improved needed to rise from a standard chair without UE assist 1x    Time 8    Period Weeks    Status New                  Plan - 11/27/20 1532    Clinical Impression Statement Initial HEP was put into action today. Pt was able to ambulate with straight cane 105 feet with CGA. Pt ambulates with decreased stride/step length and does not move her LTUE. PTA concentrated initially on posture when walking. Pt was moderately fatigued at end of session.    Personal Factors and Comorbidities Age;Fitness;Past/Current Experience;Comorbidity 1;Comorbidity 2;Comorbidity 3+;Time since onset of injury/illness/exacerbation    Comorbidities cervical myelopathy; OA multiple joints especially right shoulder; diabetes; kidney disease    Examination-Activity Limitations Locomotion Level;Transfers;Reach Overhead;Carry;Squat;Stairs;Lift;Stand    Examination-Participation Restrictions Meal Prep;Cleaning;Community Activity;Driving;Shop;Laundry    Stability/Clinical Decision Making Evolving/Moderate complexity    Rehab Potential Good    PT Frequency 2x / week    PT Duration 8 weeks    PT Treatment/Interventions ADLs/Self Care Home Management;Aquatic Therapy;Cryotherapy;Electrical Stimulation;Moist Heat;Gait training;Stair training;Functional mobility training;Therapeutic activities;Therapeutic exercise;Balance training;Neuromuscular re-education;Manual techniques;Patient/family education;Taping    PT Next Visit Plan LE strength, gait with cane or pt may bring her QC    PT Home Exercise Plan start next visit    Recommended Other Services Access Code [email or web address]           Patient will benefit from skilled therapeutic intervention in order to improve the following deficits and impairments:  Abnormal gait,Decreased range of motion,Difficulty walking,Decreased endurance,Cardiopulmonary status limiting activity,Impaired perceived functional ability,Pain,Decreased balance,Decreased strength  Visit Diagnosis: Cervical myelopathy ([facility])  Muscle weakness (generalized)  Difficulty in walking, not elsewhere classified     Problem  List Patient Active Problem List   Diagnosis Date Noted  . Type 2 diabetes mellitus with stage 4 chronic kidney disease, without long-term current use of insulin ([place]) 08/09/2020  . Dyslipidemia 08/09/2020  . CKD (chronic kidney disease) stage 4, GFR 15-29 ml/min ([facility]) 05/31/2020  . Acute bursitis of right shoulder 06/03/2019  . S/P cervical spinal fusion 03/26/2019  . Anemia of chronic disease 09/24/2018  . Adhesive bursitis of left shoulder 08/05/2018  . History of right breast cancer 12/09/2017  . Mixed hyperlipidemia 12/09/2017  . Hypertension associated with diabetes ([place]) 12/09/2017  . Hiatal hernia 12/09/2017  . Esophageal stricture 12/09/2017  . Chronic gout 12/09/2017  . Glaucoma 12/09/2017  . Greater trochanteric bursitis of right hip 11/17/2017  . Degenerative arthritis of right knee 07/03/2017  . Normochromic anemia   . H/O: upper GI bleed 07/30/2016  . Right lumbar radiculopathy 01/23/2016  . Arthritis of right acromioclavicular joint 01/02/2016  . Controlled type 2 diabetes mellitus without complication, without long-term current use of insulin ([place]) 04/03/2009    Atlee Villers, PTA 11/27/2020, 4:15 PM  [facility] [address], [place], [place], [address] Phone: [phone]   Fax:  [phone]  Name: ALYSAH CARTON MRN: [id number] Date of Birth: 11-28-1945  Access Code: [email or web address]: [email or web address]: 01/10/2022Prepared by: Anderson Malta CochranExercises  Seated Long Arc Quad - 2 x daily - 7 x weekly - 2 sets - 10 reps  Seated March - 2 x daily - 7 x weekly - 2 sets -  10 reps  Seated Isometric Hip Abduction with Resistance - 2 x daily - 7 x weekly - 2 sets - 10 reps  Sit to Stand with Armchair - 2 x daily - 7 x weekly - 2 sets - 5 reps

## 2020-11-28 ENCOUNTER — Other Ambulatory Visit: Payer: Self-pay | Admitting: Family Medicine

## 2020-11-28 DIAGNOSIS — Z1231 Encounter for screening mammogram for malignant neoplasm of breast: Secondary | ICD-10-CM

## 2020-11-29 ENCOUNTER — Encounter: Payer: Medicare Other | Admitting: Physical Therapy

## 2020-12-06 ENCOUNTER — Other Ambulatory Visit: Payer: Self-pay | Admitting: Family Medicine

## 2020-12-06 ENCOUNTER — Ambulatory Visit: Payer: Medicare Other | Admitting: Nurse Practitioner

## 2020-12-11 ENCOUNTER — Ambulatory Visit: Payer: Medicare Other | Admitting: Podiatry

## 2020-12-11 ENCOUNTER — Encounter: Payer: Self-pay | Admitting: Podiatry

## 2020-12-11 ENCOUNTER — Other Ambulatory Visit: Payer: Self-pay

## 2020-12-11 DIAGNOSIS — E119 Type 2 diabetes mellitus without complications: Secondary | ICD-10-CM

## 2020-12-11 DIAGNOSIS — M79675 Pain in left toe(s): Secondary | ICD-10-CM | POA: Diagnosis not present

## 2020-12-11 DIAGNOSIS — M79674 Pain in right toe(s): Secondary | ICD-10-CM

## 2020-12-11 DIAGNOSIS — B351 Tinea unguium: Secondary | ICD-10-CM

## 2020-12-12 DIAGNOSIS — N2581 Secondary hyperparathyroidism of renal origin: Secondary | ICD-10-CM | POA: Diagnosis not present

## 2020-12-12 DIAGNOSIS — I129 Hypertensive chronic kidney disease with stage 1 through stage 4 chronic kidney disease, or unspecified chronic kidney disease: Secondary | ICD-10-CM | POA: Diagnosis not present

## 2020-12-12 DIAGNOSIS — D638 Anemia in other chronic diseases classified elsewhere: Secondary | ICD-10-CM | POA: Diagnosis not present

## 2020-12-12 DIAGNOSIS — K2210 Ulcer of esophagus without bleeding: Secondary | ICD-10-CM | POA: Diagnosis not present

## 2020-12-12 DIAGNOSIS — N184 Chronic kidney disease, stage 4 (severe): Secondary | ICD-10-CM | POA: Diagnosis not present

## 2020-12-12 DIAGNOSIS — E1122 Type 2 diabetes mellitus with diabetic chronic kidney disease: Secondary | ICD-10-CM | POA: Diagnosis not present

## 2020-12-12 DIAGNOSIS — R3129 Other microscopic hematuria: Secondary | ICD-10-CM | POA: Diagnosis not present

## 2020-12-13 ENCOUNTER — Encounter: Payer: Self-pay | Admitting: Physical Therapy

## 2020-12-13 ENCOUNTER — Ambulatory Visit: Payer: Medicare Other | Admitting: Physical Therapy

## 2020-12-13 ENCOUNTER — Other Ambulatory Visit: Payer: Self-pay

## 2020-12-13 DIAGNOSIS — M6281 Muscle weakness (generalized): Secondary | ICD-10-CM

## 2020-12-13 DIAGNOSIS — R262 Difficulty in walking, not elsewhere classified: Secondary | ICD-10-CM

## 2020-12-13 DIAGNOSIS — G959 Disease of spinal cord, unspecified: Secondary | ICD-10-CM | POA: Diagnosis not present

## 2020-12-13 NOTE — Therapy (Signed)
[facility] [address] [place], [place], [address] Phone: [phone]   Fax:  [phone]  Physical Therapy Treatment  Patient Details  Name: Teresa Barnes MRN: [id number] Date of Birth: 1945-12-14 Referring Provider (PT): Dr. Billey Chang   Encounter Date: 12/13/2020   PT End of Session - 12/13/20 1409    Visit Number 3    Date for PT Re-Evaluation 01/16/21    Authorization Type UHC  Medicare    PT Start Time 2536    PT Stop Time 1441    PT Time Calculation (min) 38 min    Activity Tolerance Patient tolerated treatment well    Behavior During Therapy [facility] for tasks assessed/performed           Past Medical History:  Diagnosis Date  . Anemia   . Anemia of chronic disease 09/24/2018  . Anxiety   . Blood transfusion without reported diagnosis 07/2016  . Cancer ([place]) 1991   breast  . Chronic gout 12/09/2017   On allopurinol  . Diabetes mellitus without complication ([place])   . Esophageal stricture 12/09/2017   S/p dilation by GI: Dr. Scarlette Shorts; due to h/o severe recurrent erosive esophagitis.  . 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 GERD (gastroesophageal reflux disease)   . Glaucoma   . Hiatal hernia 12/09/2017  . Hyperlipidemia   . Hypertension   . Right rotator cuff tear 11/24/2015   Injected this and 26 2017  . Wears glasses     Past Surgical History:  Procedure Laterality Date  . ANTERIOR CERVICAL DECOMP/DISCECTOMY FUSION N/A 03/26/2019   Procedure: Anterior Cervical Discectomy Fusion - Cervical Three-Cervical Four - Cervical Four-Cervical Five - Cervical Five-Cervical Six;  Surgeon: Eustace Moore, MD;  Location: [place];  Service: Neurosurgery;  Laterality: N/A;  Anterior Cervical Discectomy Fusion - Cervical Three-Cervical Four - Cervical Four-Cervical Five - Cervical Five-Cervical Six  . CATARACT EXTRACTION W/ INTRAOCULAR LENS  IMPLANT, BILATERAL  2000  .  COLONOSCOPY    . cyst on spine    . ESOPHAGOGASTRODUODENOSCOPY (EGD) WITH PROPOFOL N/A 07/31/2016   Procedure: ESOPHAGOGASTRODUODENOSCOPY (EGD) WITH PROPOFOL;  Surgeon: Manus Gunning, MD;  [facility];  Service: Gastroenterology;  Laterality: N/A;  . EYE SURGERY  2011   lt eye blled  . MASTECTOMY MODIFIED RADICAL Right 1990  . REDUCTION MAMMAPLASTY Left   . TRIGGER FINGER RELEASE Right 06/24/2013   Procedure: RELEASE TRIGGER FINGER/A-1 PULLEY PIP RIGHT LONG FINGER;  Surgeon: Cammie Sickle., MD;  Location: [place];  Service: Orthopedics;  Laterality: Right;  . TRIGGER FINGER RELEASE    . TUBAL LIGATION      There were no vitals filed for this visit.   Subjective Assessment - 12/13/20 1408    Subjective I had to miss my therapy due to the bad weather. We had to stay at a hotel and I have not done my homework much.    Pertinent History LBP;  cervical fusion for myelopathy 03/26/2019    Currently in Pain? No/denies    Multiple Pain Sites No                             [facility] Adult PT Treatment/Exercise - 12/13/20 0001      Ambulation/Gait   Ambulation/Gait Yes    Ambulation/Gait Assistance 6: Modified independent (Device/Increase time)    Ambulation Distance (  Feet) 160 Feet   no rest break   Assistive device Straight cane      Knee/Hip Exercises: Aerobic   Nustep L1 x 6 min      Knee/Hip Exercises: Standing   Heel Raises Both;1 set;10 reps   Mod UE on bar   Other Standing Knee Exercises Alt toe taps at TM 10x, min-mod UE support      Knee/Hip Exercises: Seated   Long Arc Quad AROM;Strengthening;Both;1 set;10 reps;Weights    Long Arc Quad Weight 2 lbs.    Ball Squeeze 15x    Clamshell with TheraBand Green   loop 15x   Marching AROM;Strengthening;Both;1 set;10 reps;Weights    [employer] 2 lbs.    Sit to Sand 2 sets;5 reps;without UE support   need to have black pad                   PT Short Term Goals  - 12/13/20 1410      PT SHORT TERM GOAL #1   Title independent with initial HEP    Period Weeks    Status On-going             PT Long Term Goals - 11/21/20 2001      PT LONG TERM GOAL #1   Title independent with HEP    Time 8    Period Weeks    Status New    Target Date 01/16/21      PT LONG TERM GOAL #2   Title Gait with QC 150 feet needed for household ambulation    Time 8    Period Weeks    Status New      PT LONG TERM GOAL #3   Title go up and down steps with step over step using a railing due to right hip and knee strength >/= 4/5    Time 8    Period Weeks    Status New      PT LONG TERM GOAL #4   Title BERG balance score improved to 40/56 indicating decreased risk of falls    Period Weeks    Status New      PT LONG TERM GOAL #5   Title Gait with RW 500 feet needed for medium community distances    Time 8    Period Weeks    Status New      PT LONG TERM GOAL #6   Title Timed up and Go with least restrictive device 20 sec or less    Time 8    Period Weeks    Status New      PT LONG TERM GOAL #7   Title LE strength improved needed to rise from a standard chair without UE assist 1x    Time 8    Period Weeks    Status New                 Plan - 12/13/20 1410    Clinical Impression Statement Pt has missed her last PT appointments due to the weather and having to go stay in a hotel ( has not done any HEP ). Pt does report feeling "fine" after her last session, "it was a good level." Pt ambulated 160 feet with cane in the clinic today. Pt also tolerated 2# ankle weights for her seated exercises. pt is planning on purchasing some weights for home.    Personal Factors and Comorbidities Age;Fitness;Past/Current Experience;Comorbidity 1;Comorbidity 2;Comorbidity 3+;Time since onset of injury/illness/exacerbation  Comorbidities cervical myelopathy; OA multiple joints especially right shoulder; diabetes; kidney disease    Examination-Activity Limitations  Locomotion Level;Transfers;Reach Overhead;Carry;Squat;Stairs;Lift;Stand    Examination-Participation Restrictions Meal Prep;Cleaning;Community Activity;Driving;Shop;Laundry    PT Next Visit Plan LE strength, gait with cane or pt may bring her QC           Patient will benefit from skilled therapeutic intervention in order to improve the following deficits and impairments:  Abnormal gait,Decreased range of motion,Difficulty walking,Decreased endurance,Cardiopulmonary status limiting activity,Impaired perceived functional ability,Pain,Decreased balance,Decreased strength  Visit Diagnosis: Muscle weakness (generalized)  Difficulty in walking, not elsewhere classified     Problem List Patient Active Problem List   Diagnosis Date Noted  . Type 2 diabetes mellitus with stage 4 chronic kidney disease, without long-term current use of insulin ([place]) 08/09/2020  . Dyslipidemia 08/09/2020  . CKD (chronic kidney disease) stage 4, GFR 15-29 ml/min (HCC) 05/31/2020  . Acute bursitis of right shoulder 06/03/2019  . S/P cervical spinal fusion 03/26/2019  . Anemia of chronic disease 09/24/2018  . Adhesive bursitis of left shoulder 08/05/2018  . History of right breast cancer 12/09/2017  . Mixed hyperlipidemia 12/09/2017  . Hypertension associated with diabetes ([place]) 12/09/2017  . Hiatal hernia 12/09/2017  . Esophageal stricture 12/09/2017  . Chronic gout 12/09/2017  . Glaucoma 12/09/2017  . Greater trochanteric bursitis of right hip 11/17/2017  . Degenerative arthritis of right knee 07/03/2017  . Normochromic anemia   . H/O: upper GI bleed 07/30/2016  . Right lumbar radiculopathy 01/23/2016  . Arthritis of right acromioclavicular joint 01/02/2016  . Controlled type 2 diabetes mellitus without complication, without long-term current use of insulin ([place]) 04/03/2009    Djuan Talton, PTA 12/13/2020, 2:45 PM  [facility] [address] [place], [place], [address] Phone: [phone]   Fax:  [phone]  Name: Teresa Barnes MRN: [id number] Date of Birth: 09-30-1946

## 2020-12-14 ENCOUNTER — Other Ambulatory Visit: Payer: Self-pay

## 2020-12-15 ENCOUNTER — Ambulatory Visit: Payer: Medicare Other | Admitting: Physical Therapy

## 2020-12-15 DIAGNOSIS — R262 Difficulty in walking, not elsewhere classified: Secondary | ICD-10-CM

## 2020-12-15 DIAGNOSIS — M6281 Muscle weakness (generalized): Secondary | ICD-10-CM

## 2020-12-15 DIAGNOSIS — G959 Disease of spinal cord, unspecified: Secondary | ICD-10-CM | POA: Diagnosis not present

## 2020-12-15 NOTE — Therapy (Signed)
[facility] [address] [place], [place], [address] Phone: [phone]   Fax:  [phone]  Physical Therapy Treatment  Patient Details  Name: Teresa Barnes MRN: [id number] Date of Birth: 1946/07/21 Referring Provider (PT): Dr. Billey Chang   Encounter Date: 12/15/2020   PT End of Session - 12/15/20 1024    Visit Number 4    Date for PT Re-Evaluation 01/16/21    Authorization Type UHC  Medicare    PT Start Time 1020    PT Stop Time 1103    PT Time Calculation (min) 43 min    Activity Tolerance Patient tolerated treatment well    Behavior During Therapy [facility] for tasks assessed/performed           Past Medical History:  Diagnosis Date  . Anemia   . Anemia of chronic disease 09/24/2018  . Anxiety   . Blood transfusion without reported diagnosis 07/2016  . Cancer ([place]) 1991   breast  . Chronic gout 12/09/2017   On allopurinol  . Diabetes mellitus without complication ([place])   . Esophageal stricture 12/09/2017   S/p dilation by GI: Dr. Scarlette Shorts; due to h/o severe recurrent erosive esophagitis.  . 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 GERD (gastroesophageal reflux disease)   . Glaucoma   . Hiatal hernia 12/09/2017  . Hyperlipidemia   . Hypertension   . Right rotator cuff tear 11/24/2015   Injected this and 26 2017  . Wears glasses     Past Surgical History:  Procedure Laterality Date  . ANTERIOR CERVICAL DECOMP/DISCECTOMY FUSION N/A 03/26/2019   Procedure: Anterior Cervical Discectomy Fusion - Cervical Three-Cervical Four - Cervical Four-Cervical Five - Cervical Five-Cervical Six;  Surgeon: Eustace Moore, MD;  Location: [place];  Service: Neurosurgery;  Laterality: N/A;  Anterior Cervical Discectomy Fusion - Cervical Three-Cervical Four - Cervical Four-Cervical Five - Cervical Five-Cervical Six  . CATARACT EXTRACTION W/ INTRAOCULAR LENS  IMPLANT, BILATERAL  2000  .  COLONOSCOPY    . cyst on spine    . ESOPHAGOGASTRODUODENOSCOPY (EGD) WITH PROPOFOL N/A 07/31/2016   Procedure: ESOPHAGOGASTRODUODENOSCOPY (EGD) WITH PROPOFOL;  Surgeon: Manus Gunning, MD;  [facility];  Service: Gastroenterology;  Laterality: N/A;  . EYE SURGERY  2011   lt eye blled  . MASTECTOMY MODIFIED RADICAL Right 1990  . REDUCTION MAMMAPLASTY Left   . TRIGGER FINGER RELEASE Right 06/24/2013   Procedure: RELEASE TRIGGER FINGER/A-1 PULLEY PIP RIGHT LONG FINGER;  Surgeon: Cammie Sickle., MD;  Location: [place];  Service: Orthopedics;  Laterality: Right;  . TRIGGER FINGER RELEASE    . TUBAL LIGATION      There were no vitals filed for this visit.       [facility] PT Assessment - 12/15/20 0001      Standardized Balance Assessment   Five times sit to stand comments  19 sec min -mod use of UE                         [facility] Adult PT Treatment/Exercise - 12/15/20 0001      Ambulation/Gait   Ambulation/Gait Yes    Ambulation/Gait Assistance 6: Modified independent (Device/Increase time)    Ambulation Distance (Feet) 220 Feet    Assistive device Straight cane    Gait Pattern Decreased arm swing - left      Knee/Hip Exercises: Aerobic   Nustep  L1x 7 min with PTA  present to discuss current status      Knee/Hip Exercises: Standing   Heel Raises Both;1 set;15 reps    Other Standing Knee Exercises Alt toe taps at TM 15x, min-mod UE support      Knee/Hip Exercises: Seated   Long Arc Quad Strengthening;Both;1 set;15 reps;Weights    Long Arc Quad Weight 2 lbs.    Clamshell with Thera[employer] Green   loop 20x   Marching Strengthening;Both;1 set;15 reps;Weights    Marching Weights 2 lbs.                    PT Short Term Goals - 12/15/20 1036      PT SHORT TERM GOAL #1   Title independent with initial HEP    Time 4    Period Weeks    Status Achieved    Target Date 12/19/20      PT SHORT TERM GOAL #2   Title The  patient will have improved strength indicated by improved 5x sit to stand time (light UE assist) 21 sec    Time 4    Period Weeks    Status Achieved   19 sec min- mod UE            PT Long Term Goals - 11/21/20 2001      PT LONG TERM GOAL #1   Title independent with HEP    Time 8    Period Weeks    Status New    Target Date 01/16/21      PT LONG TERM GOAL #2   Title Gait with QC 150 feet needed for household ambulation    Time 8    Period Weeks    Status New      PT LONG TERM GOAL #3   Title go up and down steps with step over step using a railing due to right hip and knee strength >/= 4/5    Time 8    Period Weeks    Status New      PT LONG TERM GOAL #4   Title BERG balance score improved to 40/56 indicating decreased risk of falls    Period Weeks    Status New      PT LONG TERM GOAL #5   Title Gait with RW 500 feet needed for medium community distances    Time 8    Period Weeks    Status New      PT LONG TERM GOAL #6   Title Timed up and Go with least restrictive device 20 sec or less    Time 8    Period Weeks    Status New      PT LONG TERM GOAL #7   Title LE strength improved needed to rise from a standard chair without UE assist 1x    Time 8    Period Weeks    Status New                 Plan - 12/15/20 1025    Clinical Impression Statement Pt reports some leg soreness after her therapy but acceptable. Pt purchased 2# weights for home but felt strongly we stick ith the same weight today but was agreeable to increasing her reps. Pt also ambulated 220 feet with standard cane in the clinic, pt reports only mild LE fatigue. Pt improved her 5x sit to stand today, meeting STG.    Personal Factors and Comorbidities Age;Fitness;Past/Current Experience;Comorbidity  1;Comorbidity 2;Comorbidity 3+;Time since onset of injury/illness/exacerbation    Comorbidities cervical myelopathy; OA multiple joints especially right shoulder; diabetes; kidney disease     Examination-Activity Limitations Locomotion Level;Transfers;Reach Overhead;Carry;Squat;Stairs;Lift;Stand    Examination-Participation Restrictions Meal Prep;Cleaning;Community Activity;Driving;Shop;Laundry    Stability/Clinical Decision Making Evolving/Moderate complexity    Rehab Potential Good    PT Frequency 2x / week    PT Duration 8 weeks    PT Treatment/Interventions ADLs/Self Care Home Management;Aquatic Therapy;Cryotherapy;Electrical Stimulation;Moist Heat;Gait training;Stair training;Functional mobility training;Therapeutic activities;Therapeutic exercise;Balance training;Neuromuscular re-education;Manual techniques;Patient/family education;Taping    PT Next Visit Plan LE strength, gait with cane or pt may bring her QC: pt is interested in doing aquatic therapy to work on her walking. Send order to MD if agreeable.    Consulted and Agree with Plan of Care Patient           Patient will benefit from skilled therapeutic intervention in order to improve the following deficits and impairments:  Abnormal gait,Decreased range of motion,Difficulty walking,Decreased endurance,Cardiopulmonary status limiting activity,Impaired perceived functional ability,Pain,Decreased balance,Decreased strength  Visit Diagnosis: Muscle weakness (generalized)  Difficulty in walking, not elsewhere classified     Problem List Patient Active Problem List   Diagnosis Date Noted  . Type 2 diabetes mellitus with stage 4 chronic kidney disease, without long-term current use of insulin ([place]) 08/09/2020  . Dyslipidemia 08/09/2020  . CKD (chronic kidney disease) stage 4, GFR 15-29 ml/min (HCC) 05/31/2020  . Acute bursitis of right shoulder 06/03/2019  . S/P cervical spinal fusion 03/26/2019  . Anemia of chronic disease 09/24/2018  . Adhesive bursitis of left shoulder 08/05/2018  . History of right breast cancer 12/09/2017  . Mixed hyperlipidemia 12/09/2017  . Hypertension associated with diabetes ([place])  12/09/2017  . Hiatal hernia 12/09/2017  . Esophageal stricture 12/09/2017  . Chronic gout 12/09/2017  . Glaucoma 12/09/2017  . Greater trochanteric bursitis of right hip 11/17/2017  . Degenerative arthritis of right knee 07/03/2017  . Normochromic anemia   . H/O: upper GI bleed 07/30/2016  . Right lumbar radiculopathy 01/23/2016  . Arthritis of right acromioclavicular joint 01/02/2016  . Controlled type 2 diabetes mellitus without complication, without long-term current use of insulin ([place]) 04/03/2009    Olliver Boyadjian, PTA 12/15/2020, 2:56 PM  [facility] [address], [place], [place], [address] Phone: [phone]   Fax:  [phone]  Name: LAMONT GLASSCOCK MRN: [id number] Date of Birth: October 02, 1946

## 2020-12-16 NOTE — Progress Notes (Signed)
Subjective:  Patient ID: Teresa Barnes, female    DOB: February 14, 1946,  MRN: [id number]  75 y.o. female presents with at risk foot care. Pt has h/o NIDDM with chronic kidney disease and painful thick toenails that are difficult to trim. Pain interferes with ambulation. Aggravating factors include wearing enclosed shoe gear. Pain is relieved with periodic professional debridement.   Her husband has brought her to today's appointment. He escorted her to the treatment room and is waiting in the car. She voices no new pedal concerns on today's visit. She did not check her blood glucose this morning.  Review of Systems: Negative except as noted in the HPI.   Dr. Billey Chang is her PCP. Last visit was 08/28/2020. Past Medical History:  Diagnosis Date  . Anemia   . Anemia of chronic disease 09/24/2018  . Anxiety   . Blood transfusion without reported diagnosis 07/2016  . Cancer ([place]) 1991   breast  . Chronic gout 12/09/2017   On allopurinol  . Diabetes mellitus without complication ([place])   . Esophageal stricture 12/09/2017   S/p dilation by GI: Dr. Scarlette Shorts; due to h/o severe recurrent erosive esophagitis.  . 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 GERD (gastroesophageal reflux disease)   . Glaucoma   . Hiatal hernia 12/09/2017  . Hyperlipidemia   . Hypertension   . Right rotator cuff tear 11/24/2015   Injected this and 26 2017  . Wears glasses    Past Surgical History:  Procedure Laterality Date  . ANTERIOR CERVICAL DECOMP/DISCECTOMY FUSION N/A 03/26/2019   Procedure: Anterior Cervical Discectomy Fusion - Cervical Three-Cervical Four - Cervical Four-Cervical Five - Cervical Five-Cervical Six;  Surgeon: Eustace Moore, MD;  Location: [place];  Service: Neurosurgery;  Laterality: N/A;  Anterior Cervical Discectomy Fusion - Cervical Three-Cervical Four - Cervical Four-Cervical Five - Cervical Five-Cervical Six  . CATARACT EXTRACTION W/ INTRAOCULAR LENS  IMPLANT,  BILATERAL  2000  . COLONOSCOPY    . cyst on spine    . ESOPHAGOGASTRODUODENOSCOPY (EGD) WITH PROPOFOL N/A 07/31/2016   Procedure: ESOPHAGOGASTRODUODENOSCOPY (EGD) WITH PROPOFOL;  Surgeon: Manus Gunning, MD;  [facility];  Service: Gastroenterology;  Laterality: N/A;  . EYE SURGERY  2011   lt eye blled  . MASTECTOMY MODIFIED RADICAL Right 1990  . REDUCTION MAMMAPLASTY Left   . TRIGGER FINGER RELEASE Right 06/24/2013   Procedure: RELEASE TRIGGER FINGER/A-1 PULLEY PIP RIGHT LONG FINGER;  Surgeon: Cammie Sickle., MD;  Location: [place];  Service: Orthopedics;  Laterality: Right;  . TRIGGER FINGER RELEASE    . TUBAL LIGATION     Patient Active Problem List   Diagnosis Date Noted  . Type 2 diabetes mellitus with stage 4 chronic kidney disease, without long-term current use of insulin ([place]) 08/09/2020  . Dyslipidemia 08/09/2020  . CKD (chronic kidney disease) stage 4, GFR 15-29 ml/min (HCC) 05/31/2020  . Acute bursitis of right shoulder 06/03/2019  . S/P cervical spinal fusion 03/26/2019  . Anemia of chronic disease 09/24/2018  . Adhesive bursitis of left shoulder 08/05/2018  . History of right breast cancer 12/09/2017  . Mixed hyperlipidemia 12/09/2017  . Hypertension associated with diabetes ([place]) 12/09/2017  . Hiatal hernia 12/09/2017  . Esophageal stricture 12/09/2017  . Chronic gout 12/09/2017  . Glaucoma 12/09/2017  . Greater trochanteric bursitis of right hip 11/17/2017  . Degenerative arthritis of right knee 07/03/2017  . Normochromic anemia   . H/O: upper  GI bleed 07/30/2016  . Right lumbar radiculopathy 01/23/2016  . Arthritis of right acromioclavicular joint 01/02/2016  . Controlled type 2 diabetes mellitus without complication, without long-term current use of insulin ([place]) 04/03/2009    Current Outpatient Medications:  .  ciprofloxacin (CIPRO) 250 MG tablet, Take 250 mg by mouth 2 (two) times daily., Disp: , Rfl:  .   dorzolamide-timolol (COSOPT) 22.3-6.8 MG/ML ophthalmic solution, , Disp: , Rfl:  .  Ferrous Sulfate Dried (SLOW IRON PO), Take by mouth., Disp: , Rfl:  .  FLUZONE HIGH-DOSE QUADRIVALENT 0.7 ML SUSY, , Disp: , Rfl:  .  hydrALAZINE (APRESOLINE) 25 MG tablet, Take 12.5 mg by mouth in the morning and at bedtime. (Patient not taking: Reported on 11/21/2020), Disp: , Rfl:  .  latanoprost (XALATAN) 0.005 % ophthalmic solution, SMARTSIG:1 Drop(s) In Eye(s) Every Evening, Disp: , Rfl:  .  losartan (COZAAR) 25 MG tablet, Take 25 mg by mouth daily., Disp: , Rfl:  .  NOVOFINE PLUS PEN NEEDLE 32G X 4 MM MISC, CHECK SUGARS ONCE PER DAY, Disp: 100 each, Rfl: 0 .  omeprazole (PRILOSEC) 40 MG capsule, Take 1 capsule (40 mg total) by mouth daily., Disp: 30 capsule, Rfl: 2 .  rosuvastatin (CRESTOR) 20 MG tablet, Take 1 tablet by mouth once daily, Disp: 90 tablet, Rfl: 3 .  VICTOZA 18 MG/3ML SOPN, INJECT 1.8 MG INTO THE SKIN DAILY, Disp: 9 mL, Rfl: 0 .  vitamin B-12 (CYANOCOBALAMIN) 500 MCG tablet, Take 500 mcg by mouth daily., Disp: , Rfl:  .  Vitamin D, Cholecalciferol, 25 MCG (1000 UT) TABS, , Disp: , Rfl:  Allergies  Allergen Reactions  . Prednisone Nausea And Vomiting  . Tramadol Nausea Only   Social History   Tobacco Use  Smoking Status Former Smoker  . Packs/day: 0.25  . Years: 10.00  . Pack years: 2.50  . Types: Cigarettes  . Quit date: 06/18/1993  . Years since quitting: 27.5  Smokeless Tobacco Never Used   Objective:  There were no vitals filed for this visit. Constitutional Patient is a pleasant 75 y.o. African American @[employer] @   obese in NAD.Marland Kitchen  Vascular Neurovascular status unchanged b/l lower extremities. Capillary fill time to digits <3 seconds b/l lower extremities. Faintly palpable DP pulse(s) b/l lower extremities. Faintly palpable PT pulse(s) b/l lower extremities. Pedal hair sparse. Lower extremity skin temperature gradient within normal limits. No edema noted b/l lower extremities. No  cyanosis or clubbing noted.  Neurologic Normal speech. Oriented to person, place, and time. Protective sensation intact 5/5 intact bilaterally with 10g monofilament b/l. Vibratory sensation intact b/l. Proprioception intact bilaterally.  Dermatologic Pedal skin with normal turgor, texture and tone bilaterally. No open wounds bilaterally. No interdigital macerations bilaterally. Toenails 1-5 b/l elongated, discolored, dystrophic, thickened, crumbly with subungual debris and tenderness to dorsal palpation.  Orthopedic: Normal muscle strength 5/5 to all lower extremity muscle groups bilaterally. No pain crepitus or joint limitation noted with ROM b/l. No gross bony deformities bilaterally. Utilizes walker for ambulation assistance.   Hemoglobin A1C Latest Ref Rng & Units 08/09/2020 05/03/2020  HGBA1C 4.0 - 5.6 % 6.0(A) 5.9(A)  Some recent data might be hidden   Assessment:   1. Pain due to onychomycosis of toenails of both feet   2. Diabetes mellitus without complication ([place])    Plan:  Patient was evaluated and treated and all questions answered.  Onychomycosis with pain -Nails palliatively debridement as below. -Educated on self-care  Procedure: Nail Debridement Rationale: Pain Type of Debridement:  manual, sharp debridement. Instrumentation: Nail nipper, rotary burr. Number of Nails: 10  -Examined patient. -Continue diabetic foot care principles. -Patient to continue soft, supportive shoe gear daily. -Toenails 1-5 b/l were debrided in length and girth with sterile nail nippers and dremel without iatrogenic bleeding.  -Patient to report any pedal injuries to medical professional immediately. -Patient/POA to call should there be question/concern in the interim.  Return in about 3 months (around 03/11/2021).  Marzetta Board, DPM

## 2020-12-18 ENCOUNTER — Ambulatory Visit: Payer: Medicare Other | Admitting: Internal Medicine

## 2020-12-18 NOTE — Progress Notes (Deleted)
Name: Teresa Barnes  Age/ Sex: 75 y.o., female   MRN/ DOB: [id number], November 25, 1945     PCP: Leamon Arnt, MD   Reason for Endocrinology Evaluation: Type 2 Diabetes Mellitus  Initial Endocrine Consultative Visit: 08/09/2020    PATIENT IDENTIFIER: Teresa Barnes is a 75 y.o. female with a past medical history of HTN, T2Dm, CKD and dyslipidemia. The patient has followed with Endocrinology clinic since 08/09/2020 for consultative assistance with management of her diabetes.  DIABETIC HISTORY:  Teresa Barnes was diagnosed with DM many years ago, has been on metformin in the past. Her hemoglobin A1c has ranged from 5.9%in 2021, peaking at 7.9% in 2019   Nephrologist : laurie Rosezella [place] [facility])   On her initial visit to our clinic her A1c was 6.0 % She was on Victoza only which we continued   SUBJECTIVE:   During the last visit (08/09/2020): A1c 6.0%, continued Victoza   Today (12/18/2020): Teresa Barnes is here for a follow up on diabetes.  She checks her blood sugars *** times daily, preprandial to breakfast and ***. The patient has *** had hypoglycemic episodes since the last clinic visit, which typically occur *** x / - most often occuring ***. The patient is *** symptomatic with these episodes, with symptoms of {symptoms; hypoglycemia:9084048}.   HOME DIABETES REGIMEN:  Victoza 1.8 mg daily     Statin: yes ACE-I/ARB: CKD IV    METER DOWNLOAD SUMMARY: Date range evaluated: *** Fingerstick Blood Glucose Tests = *** Average Number Tests/Day = *** Overall Mean FS Glucose = *** Standard Deviation = ***  BG Ranges: Low = *** High = ***   Hypoglycemic Events/30 Days: BG < 50 = *** Episodes of symptomatic severe hypoglycemia = ***    DIABETIC COMPLICATIONS: Microvascular complications:   CKD IV  Denies: retinopathy, neuropathy  Last Eye Exam: Completed 03/2020  Macrovascular complications:    Denies: CAD, CVA, PVD   HISTORY:  Past Medical History:   Past Medical History:  Diagnosis Date  . Anemia   . Anemia of chronic disease 09/24/2018  . Anxiety   . Blood transfusion without reported diagnosis 07/2016  . Cancer ([place]) 1991   breast  . Chronic gout 12/09/2017   On allopurinol  . Diabetes mellitus without complication ([place])   . Esophageal stricture 12/09/2017   S/p dilation by GI: Dr. Scarlette Shorts; due to h/o severe recurrent erosive esophagitis.  . 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 GERD (gastroesophageal reflux disease)   . Glaucoma   . Hiatal hernia 12/09/2017  . Hyperlipidemia   . Hypertension   . Right rotator cuff tear 11/24/2015   Injected this and 26 2017  . Wears glasses     Past Surgical History:  Past Surgical History:  Procedure Laterality Date  . ANTERIOR CERVICAL DECOMP/DISCECTOMY FUSION N/A 03/26/2019   Procedure: Anterior Cervical Discectomy Fusion - Cervical Three-Cervical Four - Cervical Four-Cervical Five - Cervical Five-Cervical Six;  Surgeon: Eustace Moore, MD;  Location: [place];  Service: Neurosurgery;  Laterality: N/A;  Anterior Cervical Discectomy Fusion - Cervical Three-Cervical Four - Cervical Four-Cervical Five - Cervical Five-Cervical Six  . CATARACT EXTRACTION W/ INTRAOCULAR LENS  IMPLANT, BILATERAL  2000  . COLONOSCOPY    . cyst on spine    . ESOPHAGOGASTRODUODENOSCOPY (EGD) WITH PROPOFOL N/A 07/31/2016   Procedure: ESOPHAGOGASTRODUODENOSCOPY (EGD) WITH PROPOFOL;  Surgeon: Manus Gunning, MD;  [facility];  Service: Gastroenterology;  Laterality: N/A;  . EYE SURGERY  2011   lt eye blled  . MASTECTOMY MODIFIED RADICAL Right 1990  . REDUCTION MAMMAPLASTY Left   . TRIGGER FINGER RELEASE Right 06/24/2013   Procedure: RELEASE TRIGGER FINGER/A-1 PULLEY PIP RIGHT LONG FINGER;  Surgeon: Cammie Sickle., MD;  Location: [place];  Service: Orthopedics;  Laterality: Right;  . TRIGGER FINGER RELEASE    . TUBAL LIGATION       Social  History:  reports that she quit smoking about 27 years ago. Her smoking use included cigarettes. She has a 2.50 pack-year smoking history. She has never used smokeless tobacco. She reports that she does not drink alcohol and does not use drugs. Family History:  Family History  Problem Relation Age of Onset  . Diabetes Mother   . Diabetes Daughter   . Arthritis Daughter   . Hyperlipidemia Daughter   . Diabetes Son   . Arthritis Son   . Diabetes Sister   . Colon cancer Neg Hx   . Cancer Neg Hx   . Heart disease Neg Hx   . Breast cancer Neg Hx       HOME MEDICATIONS: Allergies as of 12/18/2020      Reactions   Prednisone Nausea And Vomiting   Tramadol Nausea Only      Medication List       Accurate as of December 18, 2020 12:34 PM. If you have any questions, ask your nurse or doctor.        ciprofloxacin 250 MG tablet Commonly known as: CIPRO Take 250 mg by mouth 2 (two) times daily.   dorzolamide-timolol 22.3-6.8 MG/ML ophthalmic solution Commonly known as: COSOPT   Fluzone High-Dose Quadrivalent 0.7 ML Susy Generic drug: Influenza Vac High-Dose Quad   hydrALAZINE 25 MG tablet Commonly known as: APRESOLINE Take 12.5 mg by mouth in the morning and at bedtime.   latanoprost 0.005 % ophthalmic solution Commonly known as: XALATAN SMARTSIG:1 Drop(s) In Eye(s) Every Evening   losartan 25 MG tablet Commonly known as: COZAAR Take 25 mg by mouth daily.   NovoFine Plus Pen Needle 32G X 4 MM Misc Generic drug: Insulin Pen Needle CHECK SUGARS ONCE PER DAY   omeprazole 40 MG capsule Commonly known as: PRILOSEC Take 1 capsule (40 mg total) by mouth daily.   rosuvastatin 20 MG tablet Commonly known as: CRESTOR Take 1 tablet by mouth once daily   SLOW IRON PO Take by mouth.   Victoza 18 MG/3ML Sopn Generic drug: liraglutide INJECT 1.8 MG INTO THE SKIN DAILY   vitamin B-12 500 MCG tablet Commonly known as: CYANOCOBALAMIN Take 500 mcg by mouth daily.   Vitamin  D (Cholecalciferol) 25 MCG (1000 UT) Tabs        OBJECTIVE:   Vital Signs: There were no vitals taken for this visit.  Wt Readings from Last 3 Encounters:  08/28/20 174 lb 12.8 oz (79.3 kg)  08/09/20 176 lb 3.2 oz (79.9 kg)  05/03/20 173 lb 6.4 oz (78.7 kg)     Exam: General: Pt appears well and is in NAD  Lungs: Clear with good BS bilat with no rales, rhonchi, or wheezes  Heart: RRR with normal S1 and S2 and no gallops; no murmurs; no rub  Abdomen: Normoactive bowel sounds, soft, nontender, without masses or organomegaly palpable  Extremities: No pretibial edema.  Neuro: MS is good with appropriate affect, pt is alert and Ox3  DATA REVIEWED:  Lab Results  Component Value Date   HGBA1C 6.0 (A) 08/09/2020   HGBA1C 5.9 (A) 05/03/2020   HGBA1C 6.4 (A) 08/18/2019   Lab Results  Component Value Date   MICROALBUR 8.1 (H) 03/09/2018   LDLCALC 100 (H) 05/03/2020   CREATININE 2.63 (H) 05/30/2020   Lab Results  Component Value Date   MICRALBCREAT 16.6 03/09/2018     Lab Results  Component Value Date   CHOL 171 05/03/2020   HDL 52.80 05/03/2020   LDLCALC 100 (H) 05/03/2020   TRIG 91.0 05/03/2020   CHOLHDL 3 05/03/2020         ASSESSMENT / PLAN / RECOMMENDATIONS:   1) Type 2 Diabetes Mellitus, ***controlled, With CKD IV  - Most recent A1c of *** %. Goal A1c < 7.0 %.    Plan: MEDICATIONS:  ***  EDUCATION / INSTRUCTIONS:  BG monitoring instructions: Patient is instructed to check her blood sugars 1 times a day, fasting.  Call [facility] Endocr[facility]ology clinic if: BG persistently < 70  . I reviewed the Rule of 15 for the treatment of hypoglycemia in detail with the patient. Literature supplied.    2) Diabetic complications:   Eye: Does not have known diabetic retinopathy.   Neuro/ Feet: Does not have known diabetic peripheral neuropathy .   Renal: Patient does  have known baseline CKD. She   is off lisinopril .  F/U in ***    Signed  electronically by: Mack Guise, MD  [facility] Endocrinology  [facility] [address] [place], [place] [address] Phone: [phone] FAX: [phone]   CC: Leamon Arnt, [place] [address] Phone: [phone]  Fax: [phone]  Return to Endocrinology clinic as below: Future Appointments  Date Time Provider [place]  12/18/2020  3:40 PM Shamleffer, Melanie Crazier, MD LBPC-LBENDO None  12/19/2020 12:30 PM Alvera Singh, PT OPRC-BF OPRCBF  12/19/2020  3:00 PM Willia Craze, NP LBGI-GI [facility]PCGastro  12/21/2020 12:30 PM Alvera Singh, PT OPR[facility]-BF OPRCBF  12/26/2020 12:30 PM Alvera Singh, PT OPRC-BF OPRCBF  12/28/2020 12:30 PM Alvera Singh, PT OPRC-BF OPRCBF  01/02/2021 12:30 PM Alvera Singh, PT OPRC-BF OPRCBF  01/04/2021 12:30 PM Alvera Singh, PT OPRC-BF OPRCBF  01/08/2021  2:00 PM Altamese Dilling, PTA OPRC-BF OPRCBF  01/09/2021  1:00 PM GI-BCG MM 3 GI-BCGMM GI-BREAST CE  01/11/2021  2:45 PM Ruben Im C, PT OPRC-BF OPRCBF  02/26/2021 10:30 AM Leamon Arnt, MD LBPC-HPC PEC  03/21/2021  3:30 PM Marzetta Board, DPM [facility]  05/14/2021  2:15 PM Hayden Pedro, MD TRE-TRE None  09/27/2021  1:00 PM LBPC-HPC HEALTH COACH LBPC-HPC PEC

## 2020-12-19 ENCOUNTER — Encounter: Payer: Self-pay | Admitting: Nurse Practitioner

## 2020-12-19 ENCOUNTER — Ambulatory Visit: Payer: Medicare Other | Attending: Family Medicine | Admitting: Physical Therapy

## 2020-12-19 ENCOUNTER — Ambulatory Visit: Payer: Medicare Other | Admitting: Nurse Practitioner

## 2020-12-19 ENCOUNTER — Other Ambulatory Visit: Payer: Self-pay

## 2020-12-19 VITALS — BP 128/82 | HR 67 | Ht 64.0 in | Wt 174.0 lb

## 2020-12-19 DIAGNOSIS — R262 Difficulty in walking, not elsewhere classified: Secondary | ICD-10-CM | POA: Insufficient documentation

## 2020-12-19 DIAGNOSIS — M6281 Muscle weakness (generalized): Secondary | ICD-10-CM | POA: Insufficient documentation

## 2020-12-19 DIAGNOSIS — D638 Anemia in other chronic diseases classified elsewhere: Secondary | ICD-10-CM

## 2020-12-19 DIAGNOSIS — G959 Disease of spinal cord, unspecified: Secondary | ICD-10-CM | POA: Diagnosis not present

## 2020-12-19 NOTE — Therapy (Signed)
[facility] [address], [place], [place], [address] Phone: [phone]   Fax:  [phone]  Physical Therapy Treatment  Patient Details  Name: Teresa Barnes MRN: [id number] Date of Birth: 1946/11/12 Referring Provider (PT): Dr. Billey Chang   Encounter Date: 12/19/2020   PT End of Session - 12/19/20 1348    Visit Number 5    Date for PT Re-Evaluation 01/16/21    Authorization Type UHC  Medicare    PT Start Time 1230    PT Stop Time 1315    PT Time Calculation (min) 45 min    Activity Tolerance Patient tolerated treatment well           Past Medical History:  Diagnosis Date  . Anemia   . Anemia of chronic disease 09/24/2018  . Anxiety   . Blood transfusion without reported diagnosis 07/2016  . Cancer ([place]) 1991   breast  . Chronic gout 12/09/2017   On allopurinol  . Diabetes mellitus without complication ([place])   . Esophageal stricture 12/09/2017   S/p dilation by GI: Dr. Scarlette Shorts; due to h/o severe recurrent erosive esophagitis.  . 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 GERD (gastroesophageal reflux disease)   . Glaucoma   . Hiatal hernia 12/09/2017  . Hyperlipidemia   . Hypertension   . Right rotator cuff tear 11/24/2015   Injected this and 26 2017  . Wears glasses     Past Surgical History:  Procedure Laterality Date  . ANTERIOR CERVICAL DECOMP/DISCECTOMY FUSION N/A 03/26/2019   Procedure: Anterior Cervical Discectomy Fusion - Cervical Three-Cervical Four - Cervical Four-Cervical Five - Cervical Five-Cervical Six;  Surgeon: Eustace Moore, MD;  Location: [facility];  Service: Neurosurgery;  Laterality: N/A;  Anterior Cervical Discectomy Fusion - Cervical Three-Cervical Four - Cervical Four-Cervical Five - Cervical Five-Cervical Six  . CATARACT EXTRACTION W/ INTRAOCULAR LENS  IMPLANT, BILATERAL  2000  . COLONOSCOPY    . cyst on spine    . ESOPHAGOGASTRODUODENOSCOPY (EGD)  WITH PROPOFOL N/A 07/31/2016   Procedure: ESOPHAGOGASTRODUODENOSCOPY (EGD) WITH PROPOFOL;  Surgeon: Manus Gunning, MD;  [facility];  Service: Gastroenterology;  Laterality: N/A;  . EYE SURGERY  2011   lt eye blled  . MASTECTOMY MODIFIED RADICAL Right 1990  . REDUCTION MAMMAPLASTY Left   . TRIGGER FINGER RELEASE Right 06/24/2013   Procedure: RELEASE TRIGGER FINGER/A-1 PULLEY PIP RIGHT LONG FINGER;  Surgeon: Cammie Sickle., MD;  Location: [facility];  Service: Orthopedics;  Laterality: Right;  . TRIGGER FINGER RELEASE    . TUBAL LIGATION      There were no vitals filed for this visit.   Subjective Assessment - 12/19/20 1234    Subjective I was going to change to water but I've changed my mind.  I've got too much going on.  I do OK on the walking here but I don't feel as steady with my quad cane at home.  I can do the steps in the garage with the railing.  I've been doing my exercises.    Pertinent History LBP;  cervical fusion for myelopathy 03/26/2019    How long can you stand comfortably? > 15-20 minutes for cooking    How long can you walk comfortably? with RW around house to car in driveway;  uses motorized scooter in grocery store    Patient Stated Goals I want to get right leg not be stiff  so I can walk;  do 3 steps in garage with ease;  get over fear;  walk with a quad cane    Currently in Pain? No/denies                             [facility] Adult PT Treatment/Exercise - 12/19/20 0001      Ambulation/Gait   Ambulation/Gait Yes    Ambulation/Gait Assistance 6: Modified independent (Device/Increase time)    Ambulation Distance (Feet) 175 Feet    Assistive device Straight cane    Gait Pattern --   slow cautious gait   Gait Comments rates perceived extertion 5      Therapeutic Activites    Therapeutic Activities ADL's    ADL's sit to stand, walking, stepping up/down curbs and steps      Knee/Hip Exercises: Aerobic   Nustep L1  8 min while discussing status/progress      Knee/Hip Exercises: Standing   Heel Raises Both;1 set;15 reps    Lateral Step Up Right;Left;1 set;5 reps;Hand Hold: 2;Step Height: 2"    Forward Step Up Right;Left;1 set;5 reps;Hand Hold: 2;Step Height: 2"    Other Standing Knee Exercises Alt toe taps at TM 15x, min-mod UE support      Knee/Hip Exercises: Seated   Other Seated Knee/Hip Exercises hip flexion 5# kettlebell resting on knee 10x    Other Seated Knee/Hip Exercises 5# kettlebell chair sit ups 10x    Sit to Sand 2 sets;5 reps   5# kettlebell seated on black foam                   PT Short Term Goals - 12/15/20 1036      PT SHORT TERM GOAL #1   Title independent with initial HEP    Time 4    Period Weeks    Status Achieved    Target Date 12/19/20      PT SHORT TERM GOAL #2   Title The patient will have improved strength indicated by improved 5x sit to stand time (light UE assist) 21 sec    Time 4    Period Weeks    Status Achieved   19 sec min- mod UE            PT Long Term Goals - 11/21/20 2001      PT LONG TERM GOAL #1   Title independent with HEP    Time 8    Period Weeks    Status New    Target Date 01/16/21      PT LONG TERM GOAL #2   Title Gait with QC 150 feet needed for household ambulation    Time 8    Period Weeks    Status New      PT LONG TERM GOAL #3   Title go up and down steps with step over step using a railing due to right hip and knee strength >/= 4/5    Time 8    Period Weeks    Status New      PT LONG TERM GOAL #4   Title BERG balance score improved to 40/56 indicating decreased risk of falls    Period Weeks    Status New      PT LONG TERM GOAL #5   Title Gait with RW 500 feet needed for medium community distances    Time 8    Period Weeks    Status  New      PT LONG TERM GOAL #6   Title Timed up and Go with least restrictive device 20 sec or less    Time 8    Period Weeks    Status New      PT LONG TERM GOAL #7    Title LE strength improved needed to rise from a standard chair without UE assist 1x    Time 8    Period Weeks    Status New                 Plan - 12/19/20 1349    Clinical Impression Statement The patient improving with exercise capacity and intensity. She is now able to perform loaded sit to stand but from a higher seat height.   Her gait distance is improving as well although still very slow speed with the single point cane.  CGA during gait for safety but no physical assist needed to recover balance.  Therapist also monitoring overall response to treatment.    Comorbidities cervical myelopathy; OA multiple joints especially right shoulder; diabetes; kidney disease    Examination-Activity Limitations Locomotion Level;Transfers;Reach Overhead;Carry;Squat;Stairs;Lift;Stand    Rehab Potential Good    PT Frequency 2x / week    PT Duration 8 weeks    PT Treatment/Interventions ADLs/Self Care Home Management;Aquatic Therapy;Cryotherapy;Electrical Stimulation;Moist Heat;Gait training;Stair training;Functional mobility training;Therapeutic activities;Therapeutic exercise;Balance training;Neuromuscular re-education;Manual techniques;Patient/family education;Taping    PT Next Visit Plan LE strength, gait with cane;  pt wants to hold on aquatic therapy right now but it is a part of treatment plan signed by MD           Patient will benefit from skilled therapeutic intervention in order to improve the following deficits and impairments:  Abnormal gait,Decreased range of motion,Difficulty walking,Decreased endurance,Cardiopulmonary status limiting activity,Impaired perceived functional ability,Pain,Decreased balance,Decreased strength  Visit Diagnosis: Muscle weakness (generalized)  Difficulty in walking, not elsewhere classified  Cervical myelopathy [facility])     Problem List Patient Active Problem List   Diagnosis Date Noted  . Type 2 diabetes mellitus with stage 4 chronic kidney  disease, without long-term current use of insulin ([place]) 08/09/2020  . Dyslipidemia 08/09/2020  . CKD (chronic kidney disease) stage 4, GFR 15-29 ml/min ([facility]) 05/31/2020  . Acute bursitis of right shoulder 06/03/2019  . S/P cervical spinal fusion 03/26/2019  . Anemia of chronic disease 09/24/2018  . Adhesive bursitis of left shoulder 08/05/2018  . History of right breast cancer 12/09/2017  . Mixed hyperlipidemia 12/09/2017  . Hypertension associated with diabetes ([place]) 12/09/2017  . Hiatal hernia 12/09/2017  . Esophageal stricture 12/09/2017  . Chronic gout 12/09/2017  . Glaucoma 12/09/2017  . Greater trochanteric bursitis of right hip 11/17/2017  . Degenerative arthritis of right knee 07/03/2017  . Normochromic anemia   . H/O: upper GI bleed 07/30/2016  . Right lumbar radiculopathy 01/23/2016  . Arthritis of right acromioclavicular joint 01/02/2016  . Controlled type 2 diabetes mellitus without complication, without long-term current use of insulin ([place]) 04/03/2009   Ruben Im, PT 12/19/20 1:56 PM Phone: [phone] Fax: [phone] Alvera Singh 12/19/2020, 1:56 PM  [facility] [address], [place], [place], [address] Phone: [phone]   Fax:  [phone]  Name: Teresa Barnes MRN: [id number] Date of Birth: 03-Oct-1946

## 2020-12-19 NOTE — Progress Notes (Signed)
       Chief Complaint : none  Teresa Barnes is a 75 y.o. female known to Dr. Henrene Pastor. She has a history of DM, HTN, CKD IV, GERD with erosive esophagitis and esophageal stricture.   I went in to see patient who doesn't understand reason for this visit.  There is no referral to GI in epic nor is there a paper referral.  Patient says she was called by our office to come in for an appointment.  She has no GI complaints.  It appears that her Nephrologist sent a copy of his last office note to Dr. Henrene Pastor as an Juluis Rainier and our office assumed it was a request for patient to be seen for anemia.   Patient has anemia of chronic disease, but her hgb has been stable in 9 range since at least May of 2020.  She takes oral iron . She takes daily PPI for history of severe erosive esophagitis. She hasn't had any recent GERD symptoms.  She is not due for colonoscopy until 2025.    **AT THIS POINT I AM CANCELLING THIS VISIT AS IT WAS APPARENTLY MADE IN ERROR. PATIENT HAS NO CURRENT GI ISSUES. HGB STABLE. SHE WILL CALL us FOR ANY GI CONCERNS.    Data Reviewed: 11/01/20  hgb 9.8 ([facility])  05/03/20  hgb 9.3  08/18/19 hgb 9.0  PHYSICAL EXAM :    Wt Readings from Last 3 Encounters:  12/19/20 174 lb (78.9 kg)  08/28/20 174 lb 12.8 oz (79.3 kg)  08/09/20 176 lb 3.2 oz (79.9 kg)    BP 128/82   Pulse 67   Ht 5\' 4"  (1.626 m)   Wt 174 lb (78.9 kg)   SpO2 98%   BMI 29.87 kg/m

## 2020-12-20 ENCOUNTER — Ambulatory Visit: Payer: Medicare Other | Admitting: Podiatry

## 2020-12-21 ENCOUNTER — Ambulatory Visit: Payer: Medicare Other | Admitting: Physical Therapy

## 2020-12-26 ENCOUNTER — Encounter: Payer: Medicare Other | Admitting: Physical Therapy

## 2020-12-28 ENCOUNTER — Ambulatory Visit: Payer: Medicare Other | Admitting: Physical Therapy

## 2021-01-02 ENCOUNTER — Ambulatory Visit: Payer: Medicare Other | Admitting: Physical Therapy

## 2021-01-02 ENCOUNTER — Other Ambulatory Visit: Payer: Self-pay

## 2021-01-02 NOTE — Therapy (Signed)
[facility] [address] [place], [place], [address] Phone: [phone]   Fax:  [phone]  Physical Therapy Treatment/Discharge Summary   Patient Details  Name: Teresa Barnes MRN: [id number] Date of Birth: [birth year] Referring Provider (PT): Dr. Billey Chang   Encounter Date: 01/02/2021  The patient returns to the clinic (15 minutes late for appt) and after 3 consecutive day- of appointment cancellations.  When notified at the check in desk of our attendance policy of scheduling just 1 appointment at a time now, she opted to discontinue PT services at this facility and left without being seen by therapist.    Past Medical History:  Diagnosis Date  . Anemia   . Anemia of chronic disease 09/24/2018  . Anxiety   . Blood transfusion without reported diagnosis 07/2016  . Cancer ([place]) 1991   breast  . Chronic gout 12/09/2017   On allopurinol  . Diabetes mellitus without complication ([place])   . Esophageal stricture 12/09/2017   S/p dilation by GI: Dr. Scarlette Shorts; due to h/o severe recurrent erosive esophagitis.  . 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 GERD (gastroesophageal reflux disease)   . Glaucoma   . Hiatal hernia 12/09/2017  . Hyperlipidemia   . Hypertension   . Right rotator cuff tear 11/24/2015   Injected this and 26 2017  . Wears glasses     Past Surgical History:  Procedure Laterality Date  . ANTERIOR CERVICAL DECOMP/DISCECTOMY FUSION N/A 03/26/2019   Procedure: Anterior Cervical Discectomy Fusion - Cervical Three-Cervical Four - Cervical Four-Cervical Five - Cervical Five-Cervical Six;  Surgeon: Eustace Moore, MD;  Location: [facility];  Service: Neurosurgery;  Laterality: N/A;  Anterior Cervical Discectomy Fusion - Cervical Three-Cervical Four - Cervical Four-Cervical Five - Cervical Five-Cervical Six  . CATARACT EXTRACTION W/ INTRAOCULAR LENS  IMPLANT, BILATERAL  2000  .  COLONOSCOPY    . cyst on spine    . ESOPHAGOGASTRODUODENOSCOPY (EGD) WITH PROPOFOL N/A 07/31/2016   Procedure: ESOPHAGOGASTRODUODENOSCOPY (EGD) WITH PROPOFOL;  Surgeon: Manus Gunning, MD;  Location: [facility];  Service: Gastroenterology;  Laterality: N/A;  . EYE SURGERY  2011   lt eye blled  . MASTECTOMY MODIFIED RADICAL Right 1990  . REDUCTION MAMMAPLASTY Left   . TRIGGER FINGER RELEASE Right 06/24/2013   Procedure: RELEASE TRIGGER FINGER/A-1 PULLEY PIP RIGHT LONG FINGER;  Surgeon: Cammie Sickle., MD;  Location: [facility];  Service: Orthopedics;  Laterality: Right;  . TRIGGER FINGER RELEASE    . TUBAL LIGATION      There were no vitals filed for this visit.   Subjective Assessment - 01/02/21 1244    Pertinent History LBP;  cervical fusion for myelopathy 03/26/2019    How long can you stand comfortably? > 15-20 minutes for cooking    How long can you walk comfortably? with RW around house to car in driveway;  uses motorized scooter in grocery store    Patient Stated Goals I want to get right leg not be stiff so I can walk;  do 3 steps in garage with ease;  get over fear;  walk with a quad cane                                       PT Short Term Goals - 12/15/20 1036  PT SHORT TERM GOAL #1   Title independent with initial HEP    Time 4    Period Weeks    Status Achieved    Target Date 12/19/20      PT SHORT TERM GOAL #2   Title The patient will have improved strength indicated by improved 5x sit to stand time (light UE assist) 21 sec    Time 4    Period Weeks    Status Achieved   19 sec min- mod UE            PT Long Term Goals - 11/21/20 2001      PT LONG TERM GOAL #1   Title independent with HEP    Time 8    Period Weeks    Status New    Target Date 01/16/21      PT LONG TERM GOAL #2   Title Gait with QC 150 feet needed for household ambulation    Time 8    Period Weeks    Status New      PT LONG  TERM GOAL #3   Title go up and down steps with step over step using a railing due to right hip and knee strength >/= 4/5    Time 8    Period Weeks    Status New      PT LONG TERM GOAL #4   Title BERG balance score improved to 40/56 indicating decreased risk of falls    Period Weeks    Status New      PT LONG TERM GOAL #5   Title Gait with RW 500 feet needed for medium community distances    Time 8    Period Weeks    Status New      PT LONG TERM GOAL #6   Title Timed up and Go with least restrictive device 20 sec or less    Time 8    Period Weeks    Status New      PT LONG TERM GOAL #7   Title LE strength improved needed to rise from a standard chair without UE assist 1x    Time 8    Period Weeks    Status New                  Patient will benefit from skilled therapeutic intervention in order to improve the following deficits and impairments:     Visit Diagnosis: No diagnosis found.  PHYSICAL THERAPY DISCHARGE SUMMARY  Visits from Start of Care: 5  Current functional level related to goals / functional outcomes: See above   Remaining deficits: As above   Education / Equipment: Basic HEP Plan: Patient agrees to discharge.  Patient goals were not met. Patient is being discharged due to the patient's request.  ?????       Problem List Patient Active Problem List   Diagnosis Date Noted  . Type 2 diabetes mellitus with stage 4 chronic kidney disease, without long-term current use of insulin ([place]) 08/09/2020  . Dyslipidemia 08/09/2020  . CKD (chronic kidney disease) stage 4, GFR 15-29 ml/min ([facility]) 05/31/2020  . Acute bursitis of right shoulder 06/03/2019  . S/P cervical spinal fusion 03/26/2019  . Anemia of chronic disease 09/24/2018  . Adhesive bursitis of left shoulder 08/05/2018  . History of right breast cancer 12/09/2017  . Mixed hyperlipidemia 12/09/2017  . Hypertension associated with diabetes ([place]) 12/09/2017  . Hiatal hernia 12/09/2017   . Esophageal stricture 12/09/2017  .  Chronic gout 12/09/2017  . Glaucoma 12/09/2017  . Greater trochanteric bursitis of right hip 11/17/2017  . Degenerative arthritis of right knee 07/03/2017  . Normochromic anemia   . H/O: upper GI bleed 07/30/2016  . Right lumbar radiculopathy 01/23/2016  . Arthritis of right acromioclavicular joint 01/02/2016  . Controlled type 2 diabetes mellitus without complication, without long-term current use of insulin ([place]) 04/03/2009   Ruben Im, PT 01/02/21 1:02 PM Phone: [phone] Fax: [phone] Alvera Singh 01/02/2021, 12:59 PM  [facility] [address], [place], [place], [address] Phone: [phone]   Fax:  [phone]  Name: NINOSHKA WAINWRIGHT MRN: [id number] Date of Birth: 1946/07/16

## 2021-01-04 ENCOUNTER — Ambulatory Visit: Payer: Medicare Other | Admitting: Physical Therapy

## 2021-01-05 ENCOUNTER — Other Ambulatory Visit: Payer: Self-pay | Admitting: Family Medicine

## 2021-01-08 ENCOUNTER — Encounter: Payer: Medicare Other | Admitting: Physical Therapy

## 2021-01-09 ENCOUNTER — Inpatient Hospital Stay: Admission: RE | Admit: 2021-01-09 | Payer: Medicare Other | Source: Ambulatory Visit

## 2021-01-11 ENCOUNTER — Encounter: Payer: Medicare Other | Admitting: Physical Therapy

## 2021-01-12 ENCOUNTER — Other Ambulatory Visit: Payer: Self-pay

## 2021-01-12 ENCOUNTER — Ambulatory Visit (INDEPENDENT_AMBULATORY_CARE_PROVIDER_SITE_OTHER): Payer: Medicare Other | Admitting: Family Medicine

## 2021-01-12 ENCOUNTER — Encounter: Payer: Self-pay | Admitting: Family Medicine

## 2021-01-12 VITALS — BP 122/60 | HR 62 | Temp 97.2°F | Resp 17 | Ht 64.0 in | Wt 174.0 lb

## 2021-01-12 DIAGNOSIS — N185 Chronic kidney disease, stage 5: Secondary | ICD-10-CM

## 2021-01-12 DIAGNOSIS — E1122 Type 2 diabetes mellitus with diabetic chronic kidney disease: Secondary | ICD-10-CM | POA: Diagnosis not present

## 2021-01-12 DIAGNOSIS — D638 Anemia in other chronic diseases classified elsewhere: Secondary | ICD-10-CM | POA: Diagnosis not present

## 2021-01-12 DIAGNOSIS — E1159 Type 2 diabetes mellitus with other circulatory complications: Secondary | ICD-10-CM | POA: Diagnosis not present

## 2021-01-12 DIAGNOSIS — I120 Hypertensive chronic kidney disease with stage 5 chronic kidney disease or end stage renal disease: Secondary | ICD-10-CM | POA: Diagnosis not present

## 2021-01-12 DIAGNOSIS — I152 Hypertension secondary to endocrine disorders: Secondary | ICD-10-CM | POA: Diagnosis not present

## 2021-01-12 DIAGNOSIS — E1121 Type 2 diabetes mellitus with diabetic nephropathy: Secondary | ICD-10-CM

## 2021-01-12 DIAGNOSIS — N184 Chronic kidney disease, stage 4 (severe): Secondary | ICD-10-CM

## 2021-01-12 LAB — POCT GLYCOSYLATED HEMOGLOBIN (HGB A1C): Hemoglobin A1C: 6 % — AB (ref 4.0–5.6)

## 2021-01-12 NOTE — Patient Instructions (Signed)
Please return in 3 months for your annual complete physical; please come fasting. We will recheck your labwork then.   I will be in touch if I can about your diabetes medications.   Take care!  If you have any questions or concerns, please don't hesitate to send me a message via MyChart or call the office at [phone]. Thank you for visiting with [place] today! It's our pleasure caring for you.

## 2021-01-12 NOTE — Progress Notes (Signed)
Subjective  CC:  Chief Complaint  Patient presents with  . Diabetes    Requesting A1C to be checked    HPI: Teresa Barnes is a 75 y.o. female who presents to the office today for follow up of diabetes and problems listed above in the chief complaint.   Diabetes follow up: Her diabetic control is reported as Unchanged. Continues on victoza and tolerates it well.  She denies exertional CP or SOB or symptomatic hypoglycemia. She denies foot sores or paresthesias.   CKD with anemia: take iron daily. No blood loss noted.   S/p lumbar fusion and getting stronger after PT   Wt Readings from Last 3 Encounters:  01/12/21 174 lb (78.9 kg)  12/19/20 174 lb (78.9 kg)  08/28/20 174 lb 12.8 oz (79.3 kg)    BP Readings from Last 3 Encounters:  01/12/21 122/60  12/19/20 128/82  08/28/20 140/62    Assessment  1. Type 2 diabetes with nephropathy ([place])   2. Type 2 diabetes mellitus with stage 4 chronic kidney disease, without long-term current use of insulin ([place])   3. Hypertension associated with diabetes ([place])   4. Hypertensive kidney disease with CKD (chronic kidney disease) stage V ([place])   5. Anemia of chronic disease      Plan   Diabetes is currently well controlled. No changes. Consider changing to once weekly GLP-1.  BP is controlled.   Continue home exercises.   Follow up: . Orders Placed This Encounter  Procedures  . POCT HgB A1C   No orders of the defined types were placed in this encounter.     Immunization History  Administered Date(s) Administered  . Fluad Quad(high Dose 65+) 08/18/2019  . Influenza, High Dose Seasonal PF 08/18/2017, 07/15/2018, 08/14/2020  . Influenza,inj,Quad PF,6+ Mos 08/01/2016  . Influenza-Unspecified 08/18/2014  . Moderna Sars-Covid-2 Vaccination 01/01/2020, 01/29/2020, 09/25/2020  . Pneumococcal Conjugate-13 12/09/2017  . Pneumococcal Polysaccharide-23 09/24/2018  . Zoster Recombinat (Shingrix) 10/21/2018, 01/27/2019    Diabetes  Related Lab Review: Lab Results  Component Value Date   HGBA1C 6.0 (A) 01/12/2021   HGBA1C 6.0 (A) 08/09/2020   HGBA1C 5.9 (A) 05/03/2020    Lab Results  Component Value Date   MICROALBUR 8.1 (H) 03/09/2018   Lab Results  Component Value Date   CREATININE 2.63 (H) 05/30/2020   BUN 61 (H) 05/30/2020   NA 137 05/30/2020   K 4.6 05/30/2020   CL 104 05/30/2020   CO2 24 05/30/2020   Lab Results  Component Value Date   CHOL 171 05/03/2020   CHOL 134 08/18/2019   CHOL 180 05/17/2019   Lab Results  Component Value Date   HDL 52.80 05/03/2020   HDL 47.70 08/18/2019   HDL 48.70 05/17/2019   Lab Results  Component Value Date   LDLCALC 100 (H) 05/03/2020   LDLCALC 71 08/18/2019   LDLCALC 113 (H) 05/17/2019   Lab Results  Component Value Date   TRIG 91.0 05/03/2020   TRIG 78.0 08/18/2019   TRIG 90.0 05/17/2019   Lab Results  Component Value Date   CHOLHDL 3 05/03/2020   CHOLHDL 3 08/18/2019   CHOLHDL 4 05/17/2019   No results found for: LDLDIRECT The 10-year ASCVD risk score Mikey Bussing DC Jr., et al., 2013) is: 28.2%   Values used to calculate the score:     Age: 67 years     Sex: Female     Is Non-Hispanic African American: Yes     Diabetic: Yes  Tobacco smoker: No     Systolic Blood Pressure: 295 mmHg     Is BP treated: Yes     HDL Cholesterol: 52.8 mg/dL     Total Cholesterol: 171 mg/dL I have reviewed the PMH, Fam and Soc history. Patient Active Problem List   Diagnosis Date Noted  . Type 2 diabetes mellitus with stage 4 chronic kidney disease, without long-term current use of insulin ([place]) 08/09/2020    Priority: High  . CKD (chronic kidney disease) stage 4, GFR 15-29 ml/min ([facility]) 05/31/2020    Priority: High    Normal ultrasound, 07/2020; referred to renal 05/2020   . Mixed hyperlipidemia 12/09/2017    Priority: High  . Hypertension associated with diabetes ([place]) 12/09/2017    Priority: High  . H/O: upper GI bleed 07/30/2016    Priority: High    Due  to severe erosive esophagitis; 2017; healed with PPI by egd f/u; has [place] and stricture; s/p blood transfusion. Negative for Barretts esophagitis   . S/P cervical spinal fusion 03/26/2019    Priority: Medium    03/2019 severe spinal stenosis and cervical myelopathy   . Anemia of chronic disease 09/24/2018    Priority: Medium  . History of right breast cancer 12/09/2017    Priority: Medium    S/p mastectomy 1990   . Hiatal hernia 12/09/2017    Priority: Medium  . Esophageal stricture 12/09/2017    Priority: Medium    S/p dilation by GI: Dr. Scarlette Shorts; due to h/o severe recurrent erosive esophagitis.   . Chronic gout 12/09/2017    Priority: Medium    On allopurinol   . Glaucoma 12/09/2017    Priority: Medium  . Degenerative arthritis of right knee 07/03/2017    Priority: Medium    Injected 07/03/2017' Injected August 05, 2018   . Right lumbar radiculopathy 01/23/2016    Priority: Medium  . Arthritis of right acromioclavicular joint 01/02/2016    Priority: Medium    Injected 01/02/2016 Repeat injection 03/12/2016   . Greater trochanteric bursitis of right hip 11/17/2017    Priority: Low    Injected November 17, 2017 Repeat injection May 05, 2018   . Normochromic anemia     Priority: Low  . Hypertensive kidney disease with CKD (chronic kidney disease) stage V ([place]) 01/12/2021  . Acute bursitis of right shoulder 06/03/2019    Injected in June 03, 2019   . Adhesive bursitis of left shoulder 08/05/2018    Injected August 05, 2018     Social History: Patient  reports that she quit smoking about 27 years ago. Her smoking use included cigarettes. She has a 2.50 pack-year smoking history. She has never used smokeless tobacco. She reports that she does not drink alcohol and does not use drugs.  Review of Systems: Ophthalmic: negative for eye pain, loss of vision or double vision Cardiovascular: negative for chest pain Respiratory: negative for SOB or persistent  cough Gastrointestinal: negative for abdominal pain Genitourinary: negative for dysuria or gross hematuria MSK: negative for foot lesions Neurologic: negative for weakness or gait disturbance  Objective  Vitals: BP 122/60   Pulse 62   Temp (!) 97.2 F (36.2 C) (Temporal)   Resp 17   Ht 5\' 4"  (1.626 m)   Wt 174 lb (78.9 kg)   SpO2 97%   BMI 29.87 kg/m  General: well appearing, no acute distress  Psych:  Alert and oriented, normal mood and affect HEENT:  Normocephalic, atraumatic, moist mucous membranes, supple neck  Cardiovascular:  Nl S1 and S2, RRR without murmur, gallop or rub. no edema Respiratory:  Good breath sounds bilaterally, CTAB with normal effort, no rales   Diabetic education: ongoing education regarding chronic disease management for diabetes was given today. We continue to reinforce the ABC's of diabetic management: A1c (<7 or 8 dependent upon patient), tight blood pressure control, and cholesterol management with goal LDL < 100 minimally. We discuss diet strategies, exercise recommendations, medication options and possible side effects. At each visit, we review recommended immunizations and preventive care recommendations for diabetics and stress that good diabetic control can prevent other problems. See below for this patient's data.    Commons side effects, risks, benefits, and alternatives for medications and treatment plan prescribed today were discussed, and the patient expressed understanding of the given instructions. Patient is instructed to call or message via MyChart if he/she has any questions or concerns regarding our treatment plan. No barriers to understanding were identified. We discussed Red Flag symptoms and signs in detail. Patient expressed understanding regarding what to do in case of urgent or emergency type symptoms.   Medication list was reconciled, printed and provided to the patient in AVS. Patient instructions and summary information was reviewed  with the patient as documented in the AVS. This note was prepared with assistance of Dragon voice recognition software. Occasional wrong-word or sound-a-like substitutions may have occurred due to the inherent limitations of voice recognition software  This visit occurred during the SARS-CoV-2 public health emergency.  Safety protocols were in place, including screening questions prior to the visit, additional usage of staff PPE, and extensive cleaning of exam room while observing appropriate contact time as indicated for disinfecting solutions.

## 2021-01-13 IMAGING — MR MRI LUMBAR SPINE WITHOUT CONTRAST
4 of 5 series · 26 of 48 positions shown · non-contrast
Comparison: MRI lumbar spine 08/21/2017

CLINICAL DATA: Low back pain

EXAM:
MRI LUMBAR SPINE WITHOUT CONTRAST
TECHNIQUE: Multiplanar, multisequence MR imaging of the lumbar spine was
performed. No intravenous contrast was administered.

[Series 3: T2 post-contrast · sagittal · 4.0mm · 0.55mm/px · 6 of 13 slices shown]
[im 1/13]
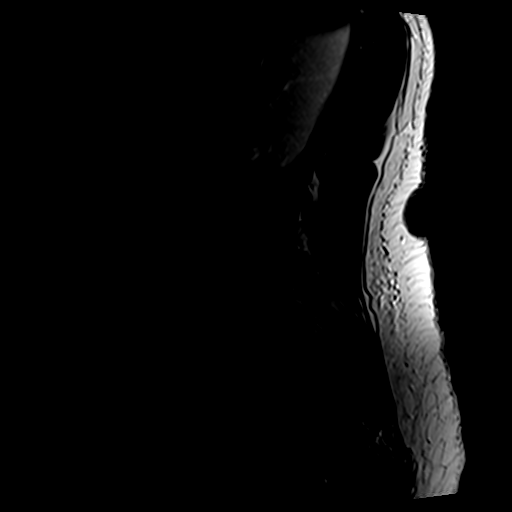
[im 3/13]
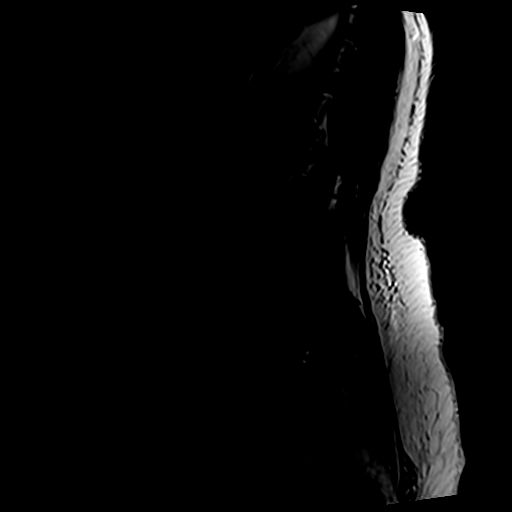
[im 5/13]
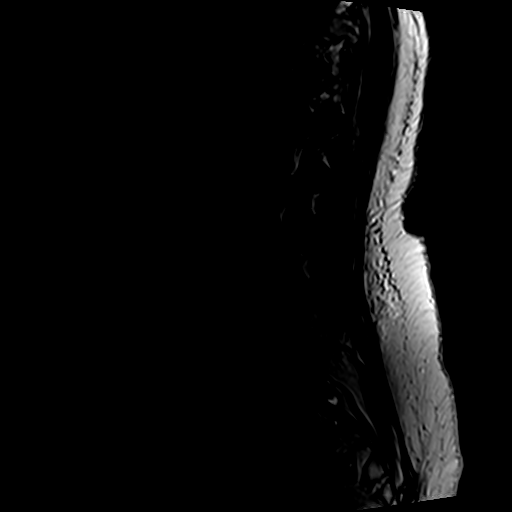
[im 8/13]
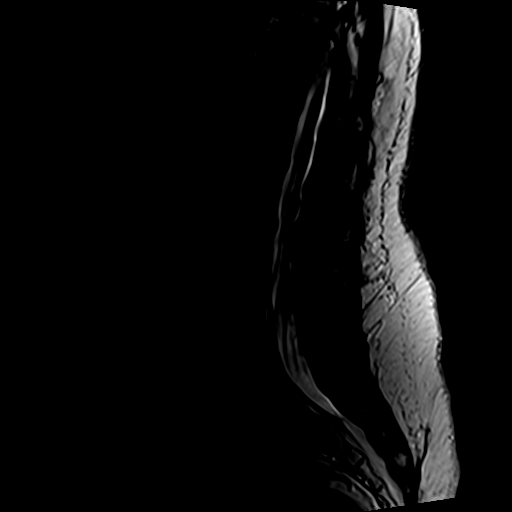
[im 10/13]
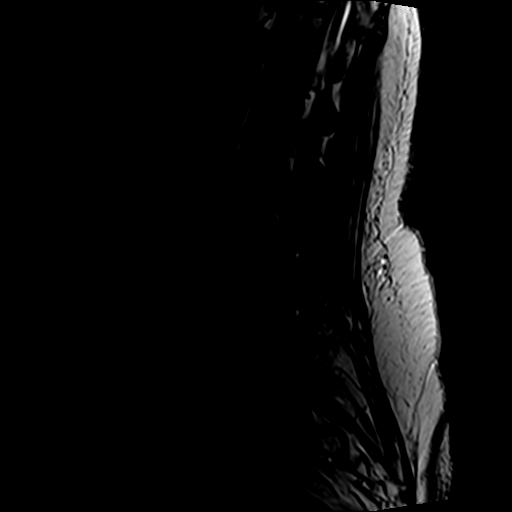
[im 13/13]
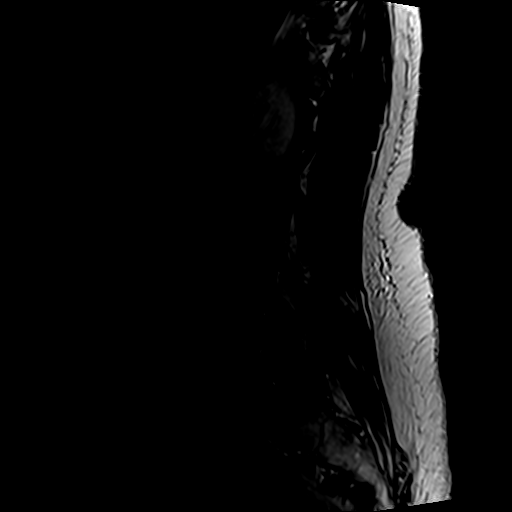

[Series 5: T1 · sagittal · 4.0mm · 0.55mm/px · 6 of 13 slices shown (1 of 2)]
[im 1/13]
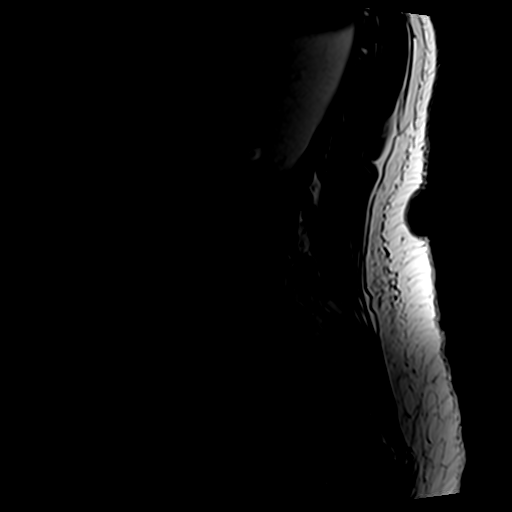
[im 3/13]
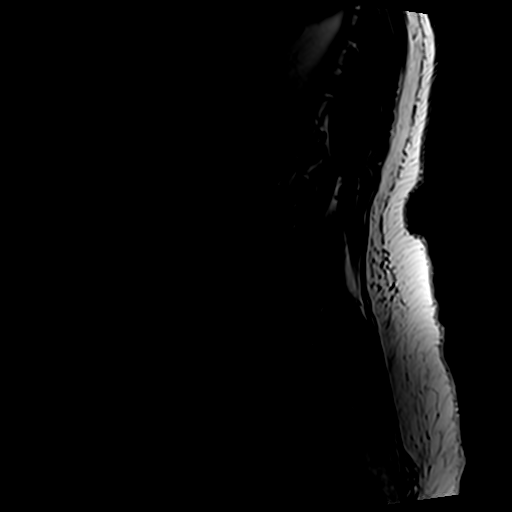
[im 5/13]
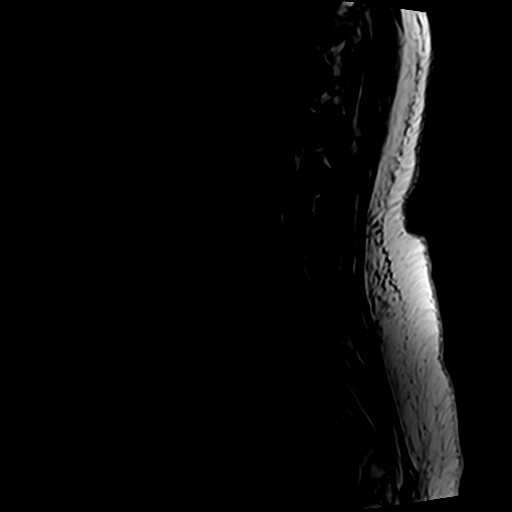
[im 8/13]
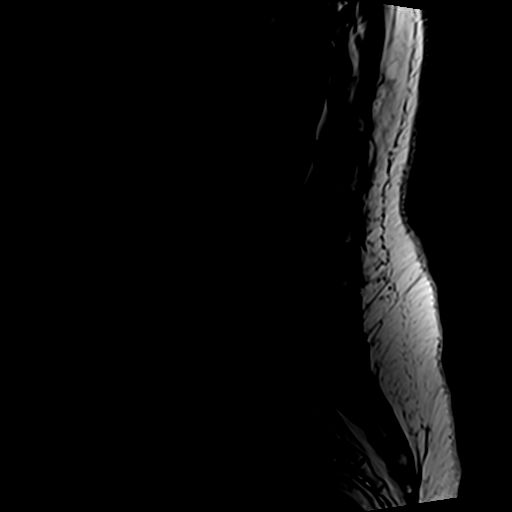
[im 10/13]
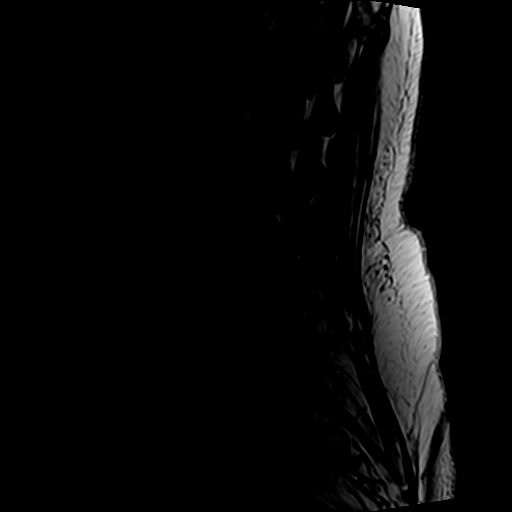
[im 13/13]
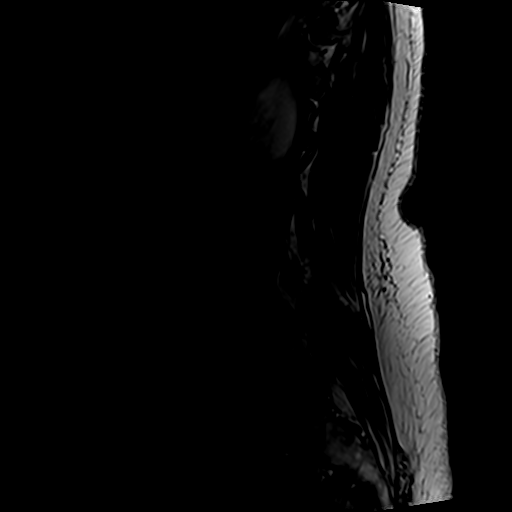

[Series 6: T1 · axial · 4.0mm · 0.35mm/px · z∈[-91,+86]mm · 5 of 35 slices shown (2 of 2)]
[im 1/35]
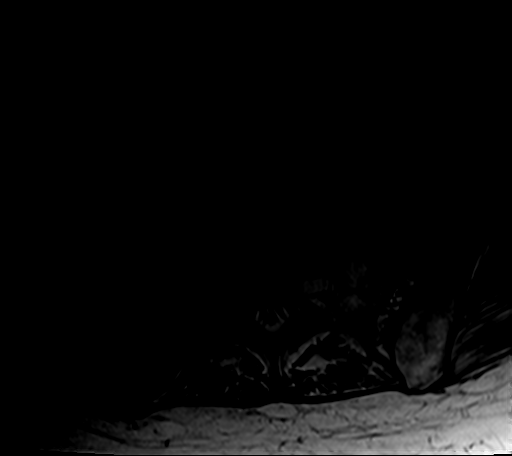
[im 5/35]
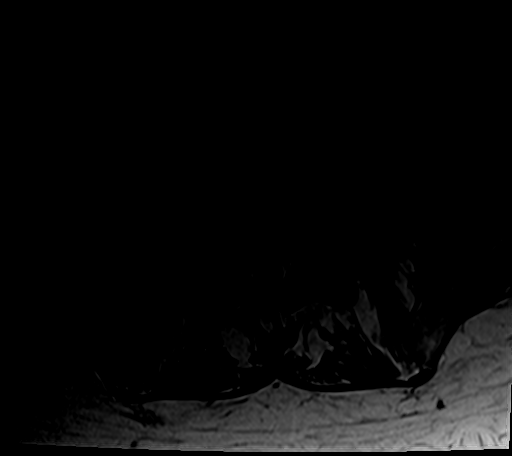
[im 10/35]
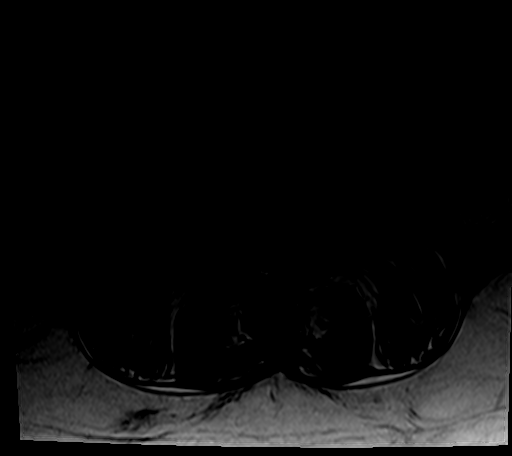
[im 18/35]
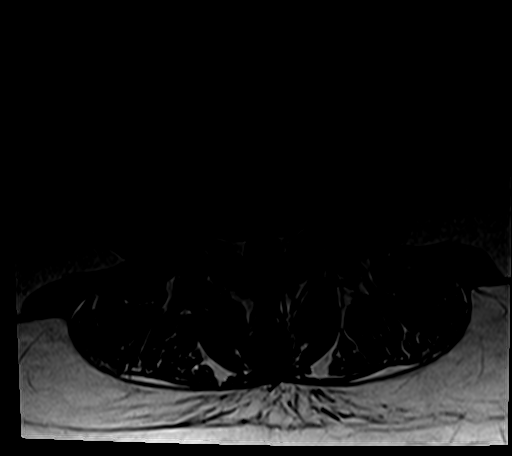
[im 30/35]
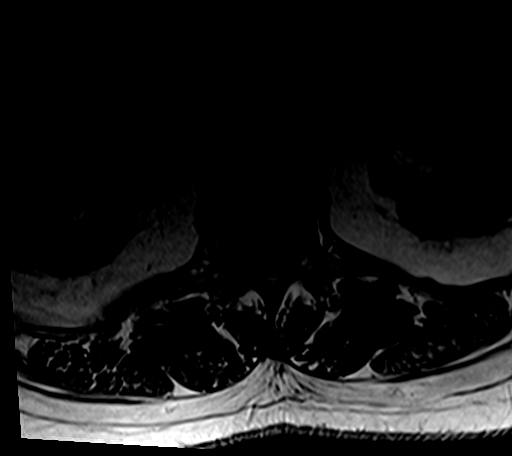

[Series 7: T2 · axial · 4.0mm · 0.70mm/px · z∈[-91,+111]mm · 9 of 35 slices shown]
[im 1/35]
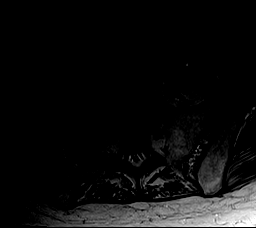
[im 5/35]
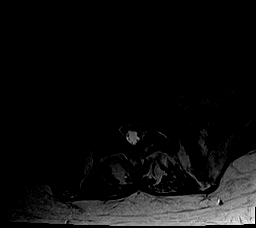
[im 10/35]
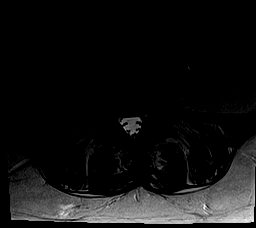
[im 15/35]
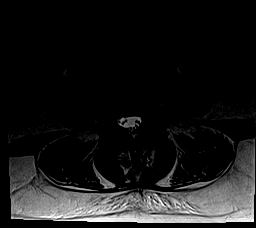
[im 18/35]
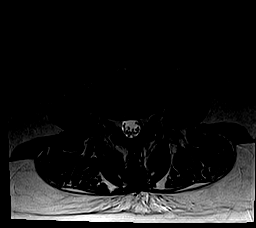
[im 20/35]
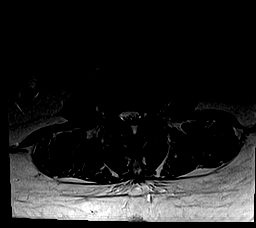
[im 25/35]
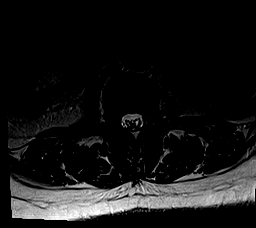
[im 30/35]
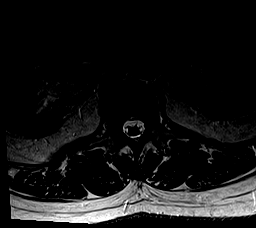
[im 35/35]
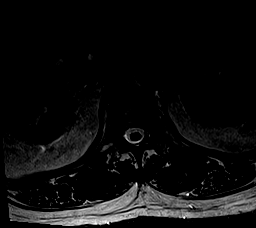

[26 of 48 positions shown; findings below may reference images not displayed]

FINDINGS: Segmentation:  Normal

Alignment:  Normal

Vertebrae:  Normal bone marrow.  Negative for fracture or mass.

Conus medullaris and cauda equina: Conus extends to the L2 level.
Conus and cauda equina appear normal.

Small hyperintensity in the spinal cord at the T11 level seen only
on sagittal images, no axial images through this lesion. However,
and I feel this is a real finding. Probable area of chronic
myelomalacia. No significant spinal stenosis or mass.

Paraspinal and other soft tissues: Negative for paraspinous mass or
soft tissue edema

Disc levels:

L1-2: Small central disc protrusion.  Negative for stenosis

L2-3: Disc degeneration with diffuse disc bulging and bilateral
facet hypertrophy. Small left foraminal disc protrusion without
significant foraminal encroachment. No change from the prior study.

L3-4: Mild disc degeneration and disc bulging. Mild facet
degeneration. No disc protrusion or stenosis.

L4-5: Mild disc degeneration with shallow central disc protrusion.
Mild facet degeneration without significant stenosis

L5-S1: Bilateral facet hypertrophy with mild subarticular stenosis
bilaterally similar to the prior study.
IMPRESSION: 1. Mild lumbar degenerative changes are similar to the prior MRI in
6097. No acute neural impingement
2. Small area of cord hyperintensity at the T11 level without
masslike features. In retrospect, this was also present in 6097 and
is most consistent with chronic myelomalacia.

## 2021-02-13 DIAGNOSIS — E113593 Type 2 diabetes mellitus with proliferative diabetic retinopathy without macular edema, bilateral: Secondary | ICD-10-CM | POA: Diagnosis not present

## 2021-02-13 DIAGNOSIS — H40013 Open angle with borderline findings, low risk, bilateral: Secondary | ICD-10-CM | POA: Diagnosis not present

## 2021-02-21 ENCOUNTER — Telehealth: Payer: Self-pay

## 2021-02-21 NOTE — Telephone Encounter (Signed)
Please advise on suggestions for specialists and if you will call in an antibiotic

## 2021-02-21 NOTE — Telephone Encounter (Signed)
No antibiotic needed for a broken tooth.  [facility] is a good group but her dentist may be able to handle her problem as well.

## 2021-02-21 NOTE — Telephone Encounter (Signed)
Patient given PCP recommendations listed below.

## 2021-02-21 NOTE — Telephone Encounter (Signed)
Pt called asking if Dr. Jonni Sanger would give her a call. Pt states she broke her tooth and has to go to a dentist to get her tooth fixed. Pt asked if Dr. Jonni Sanger had any recommendations on dentists or oral surgeons that are specialized with diabetic patients. Pt also wanted to know if she needs antibiotics for it. Please advise.

## 2021-02-22 ENCOUNTER — Telehealth: Payer: Self-pay | Admitting: Family Medicine

## 2021-02-22 NOTE — Chronic Care Management (AMB) (Signed)
  Chronic Care Management   Note  02/22/2021 Name: Teresa Barnes MRN: [id number] DOB: 07-11-1946  Teresa Barnes is a 75 y.o. year old female who is a primary care patient of Leamon Arnt, MD. I reached out to [name] by phone today in response to a referral sent by Teresa Barnes's PCP, Leamon Arnt, MD.   Teresa Barnes was given information about Chronic Care Management services today including:  1. CCM service includes personalized support from designated clinical staff supervised by her physician, including individualized plan of care and coordination with other care providers 2. 24/7 contact phone numbers for assistance for urgent and routine care needs. 3. Service will only be billed when office clinical staff spend 20 minutes or more in a month to coordinate care. 4. Only one practitioner may furnish and bill the service in a calendar month. 5. The patient may stop CCM services at any time (effective at the end of the month) by phone call to the office staff.   Patient did not agree to enrollment in care management services and does not wish to consider at this time.  Follow up plan:   [name] [employer] Scheduler

## 2021-02-26 ENCOUNTER — Ambulatory Visit: Payer: Medicare Other | Admitting: Family Medicine

## 2021-02-27 ENCOUNTER — Other Ambulatory Visit: Payer: Self-pay

## 2021-02-27 ENCOUNTER — Ambulatory Visit
Admission: RE | Admit: 2021-02-27 | Discharge: 2021-02-27 | Disposition: A | Discharge status: Home / Self Care | Payer: Medicare Other | Source: Ambulatory Visit | Attending: Family Medicine | Admitting: Family Medicine

## 2021-02-27 DIAGNOSIS — Z1231 Encounter for screening mammogram for malignant neoplasm of breast: Secondary | ICD-10-CM | POA: Diagnosis not present

## 2021-03-04 ENCOUNTER — Other Ambulatory Visit: Payer: Self-pay | Admitting: Family Medicine

## 2021-03-12 DIAGNOSIS — C50911 Malignant neoplasm of unspecified site of right female breast: Secondary | ICD-10-CM | POA: Diagnosis not present

## 2021-03-12 DIAGNOSIS — Z9012 Acquired absence of left breast and nipple: Secondary | ICD-10-CM | POA: Diagnosis not present

## 2021-03-13 ENCOUNTER — Other Ambulatory Visit: Payer: Self-pay | Admitting: Family Medicine

## 2021-03-14 DIAGNOSIS — C50911 Malignant neoplasm of unspecified site of right female breast: Secondary | ICD-10-CM | POA: Diagnosis not present

## 2021-03-21 ENCOUNTER — Other Ambulatory Visit: Payer: Self-pay

## 2021-03-21 ENCOUNTER — Ambulatory Visit: Payer: Medicare Other | Admitting: Podiatry

## 2021-03-21 ENCOUNTER — Encounter: Payer: Self-pay | Admitting: Podiatry

## 2021-03-21 DIAGNOSIS — B351 Tinea unguium: Secondary | ICD-10-CM | POA: Diagnosis not present

## 2021-03-21 DIAGNOSIS — M79675 Pain in left toe(s): Secondary | ICD-10-CM

## 2021-03-21 DIAGNOSIS — M79674 Pain in right toe(s): Secondary | ICD-10-CM | POA: Diagnosis not present

## 2021-03-21 DIAGNOSIS — E119 Type 2 diabetes mellitus without complications: Secondary | ICD-10-CM

## 2021-03-28 DIAGNOSIS — D631 Anemia in chronic kidney disease: Secondary | ICD-10-CM | POA: Diagnosis not present

## 2021-03-28 DIAGNOSIS — N184 Chronic kidney disease, stage 4 (severe): Secondary | ICD-10-CM | POA: Diagnosis not present

## 2021-03-30 NOTE — Progress Notes (Signed)
  Subjective:  Patient ID: Teresa Barnes, female    DOB: 11/17/1946,  MRN: [id number]  75 y.o. female presents preventative diabetic foot care and painful thick toenails that are difficult to trim. Pain interferes with ambulation. Aggravating factors include wearing enclosed shoe gear. Pain is relieved with periodic professional debridement.   She does not monitor her blood glucose routinely.  PCP is Dr. Billey Chang and last visit was 01/12/2021.  Allergies  Allergen Reactions  . Prednisone Nausea And Vomiting  . Tramadol Nausea Only    Review of Systems: Negative except as noted in the HPI.   Objective:   Constitutional Pt is a pleasant 75 y.o. African American female in NAD. [name] x 3.   Vascular Capillary fill time to digits <3 seconds b/l lower extremities. Faintly palpable pedal pulses b/l. Pedal hair absent. Lower extremity skin temperature gradient within normal limits.  Neurologic Protective sensation intact 5/5 intact bilaterally with 10g monofilament b/l.  Dermatologic Pedal skin with normal turgor, texture and tone bilaterally. No open wounds bilaterally. No interdigital macerations bilaterally. Toenails 1-5 b/l elongated, discolored, dystrophic, thickened, crumbly with subungual debris and tenderness to dorsal palpation.  Orthopedic: Normal muscle strength 5/5 to all lower extremity muscle groups bilaterally. No pain crepitus or joint limitation noted with ROM b/l. No gross bony deformities bilaterally.   Radiographs: None Assessment:   1. Pain due to onychomycosis of toenails of both feet   2. Diabetes mellitus without complication ([place])    Plan:  Patient was evaluated and treated and all questions answered.  Onychomycosis with pain -Nails palliatively debridement as below. -Educated on self-care  Procedure: Nail Debridement Rationale: Pain Type of Debridement: manual, sharp debridement. Instrumentation: Nail nipper, rotary burr. Number of Nails: 10  -Examined  patient. -Continue diabetic foot care principles. -Patient to continue soft, supportive shoe gear daily. -Toenails 1-5 b/l were debrided in length and girth with sterile nail nippers and dremel without iatrogenic bleeding.  -Patient to report any pedal injuries to medical professional immediately. -Patient/POA to call should there be question/concern in the interim.  Return in about 3 months (around 06/21/2021).  Marzetta Board, DPM

## 2021-04-02 DIAGNOSIS — D638 Anemia in other chronic diseases classified elsewhere: Secondary | ICD-10-CM | POA: Diagnosis not present

## 2021-04-02 DIAGNOSIS — I129 Hypertensive chronic kidney disease with stage 1 through stage 4 chronic kidney disease, or unspecified chronic kidney disease: Secondary | ICD-10-CM | POA: Diagnosis not present

## 2021-04-02 DIAGNOSIS — N2581 Secondary hyperparathyroidism of renal origin: Secondary | ICD-10-CM | POA: Diagnosis not present

## 2021-04-02 DIAGNOSIS — R3129 Other microscopic hematuria: Secondary | ICD-10-CM | POA: Diagnosis not present

## 2021-04-02 DIAGNOSIS — K2210 Ulcer of esophagus without bleeding: Secondary | ICD-10-CM | POA: Diagnosis not present

## 2021-04-02 DIAGNOSIS — N1832 Chronic kidney disease, stage 3b: Secondary | ICD-10-CM | POA: Diagnosis not present

## 2021-04-02 DIAGNOSIS — E1122 Type 2 diabetes mellitus with diabetic chronic kidney disease: Secondary | ICD-10-CM | POA: Diagnosis not present

## 2021-04-04 ENCOUNTER — Ambulatory Visit: Payer: Medicare Other | Admitting: Family Medicine

## 2021-04-04 ENCOUNTER — Encounter: Payer: Medicare Other | Admitting: Family Medicine

## 2021-04-12 ENCOUNTER — Other Ambulatory Visit: Payer: Self-pay

## 2021-04-12 ENCOUNTER — Encounter: Payer: Self-pay | Admitting: Family Medicine

## 2021-04-12 ENCOUNTER — Ambulatory Visit (INDEPENDENT_AMBULATORY_CARE_PROVIDER_SITE_OTHER): Payer: Medicare Other | Admitting: Family Medicine

## 2021-04-12 VITALS — BP 134/76 | HR 54 | Temp 97.9°F | Ht 64.0 in | Wt 176.2 lb

## 2021-04-12 DIAGNOSIS — E559 Vitamin D deficiency, unspecified: Secondary | ICD-10-CM

## 2021-04-12 DIAGNOSIS — E1159 Type 2 diabetes mellitus with other circulatory complications: Secondary | ICD-10-CM

## 2021-04-12 DIAGNOSIS — N184 Chronic kidney disease, stage 4 (severe): Secondary | ICD-10-CM | POA: Diagnosis not present

## 2021-04-12 DIAGNOSIS — I152 Hypertension secondary to endocrine disorders: Secondary | ICD-10-CM

## 2021-04-12 DIAGNOSIS — E538 Deficiency of other specified B group vitamins: Secondary | ICD-10-CM

## 2021-04-12 DIAGNOSIS — D638 Anemia in other chronic diseases classified elsewhere: Secondary | ICD-10-CM

## 2021-04-12 DIAGNOSIS — E782 Mixed hyperlipidemia: Secondary | ICD-10-CM

## 2021-04-12 DIAGNOSIS — E1122 Type 2 diabetes mellitus with diabetic chronic kidney disease: Secondary | ICD-10-CM | POA: Diagnosis not present

## 2021-04-12 LAB — CBC WITH DIFFERENTIAL/PLATELET
Basophils Absolute: 0.1 10*3/uL (ref 0.0–0.1)
Basophils Relative: 1.3 % (ref 0.0–3.0)
Eosinophils Absolute: 0.4 10*3/uL (ref 0.0–0.7)
Eosinophils Relative: 5.9 % — ABNORMAL HIGH (ref 0.0–5.0)
HCT: 28.9 % — ABNORMAL LOW (ref 36.0–46.0)
Hemoglobin: 9.7 g/dL — ABNORMAL LOW (ref 12.0–15.0)
Lymphocytes Relative: 34.3 % (ref 12.0–46.0)
Lymphs Abs: 2.1 10*3/uL (ref 0.7–4.0)
MCHC: 33.4 g/dL (ref 30.0–36.0)
MCV: 90.9 fl (ref 78.0–100.0)
Monocytes Absolute: 0.7 10*3/uL (ref 0.1–1.0)
Monocytes Relative: 11 % (ref 3.0–12.0)
Neutro Abs: 2.9 10*3/uL (ref 1.4–7.7)
Neutrophils Relative %: 47.5 % (ref 43.0–77.0)
Platelets: 291 10*3/uL (ref 150.0–400.0)
RBC: 3.18 Mil/uL — ABNORMAL LOW (ref 3.87–5.11)
RDW: 15.5 % (ref 11.5–15.5)
WBC: 6 10*3/uL (ref 4.0–10.5)

## 2021-04-12 LAB — HEPATIC FUNCTION PANEL
ALT: 8 U/L (ref 0–35)
AST: 12 U/L (ref 0–37)
Albumin: 4.1 g/dL (ref 3.5–5.2)
Alkaline Phosphatase: 49 U/L (ref 39–117)
Bilirubin, Direct: 0.1 mg/dL (ref 0.0–0.3)
Total Bilirubin: 0.7 mg/dL (ref 0.2–1.2)
Total Protein: 6.7 g/dL (ref 6.0–8.3)

## 2021-04-12 LAB — RENAL FUNCTION PANEL
Albumin: 4.1 g/dL (ref 3.5–5.2)
BUN: 37 mg/dL — ABNORMAL HIGH (ref 6–23)
CO2: 25 mEq/L (ref 19–32)
Calcium: 9.3 mg/dL (ref 8.4–10.5)
Chloride: 109 mEq/L (ref 96–112)
Creatinine, Ser: 1.56 mg/dL — ABNORMAL HIGH (ref 0.40–1.20)
GFR: 32.07 mL/min — ABNORMAL LOW (ref >60.00)
Glucose, Bld: 98 mg/dL (ref 70–99)
Phosphorus: 3.9 mg/dL (ref 2.3–4.6)
Potassium: 4.6 mEq/L (ref 3.5–5.1)
Sodium: 141 mEq/L (ref 135–145)

## 2021-04-12 LAB — TSH: TSH: 0.59 u[IU]/mL (ref 0.35–4.50)

## 2021-04-12 LAB — LIPID PANEL
Cholesterol: 150 mg/dL (ref 0–200)
HDL: 57.5 mg/dL (ref >39.00)
LDL Cholesterol: 83 mg/dL (ref 0–99)
NonHDL: 92.73
Total CHOL/HDL Ratio: 3
Triglycerides: 49 mg/dL (ref 0.0–149.0)
VLDL: 9.8 mg/dL (ref 0.0–40.0)

## 2021-04-12 LAB — VITAMIN D 25 HYDROXY (VIT D DEFICIENCY, FRACTURES): VITD: 30.92 ng/mL (ref 30.00–100.00)

## 2021-04-12 LAB — B12 AND FOLATE PANEL
Folate: 8.1 ng/mL (ref >5.9)
Vitamin B-12: 672 pg/mL (ref 211–911)

## 2021-04-12 LAB — HEMOGLOBIN A1C: Hgb A1c MFr Bld: 6.4 % (ref 4.6–6.5)

## 2021-04-12 NOTE — Progress Notes (Signed)
Subjective  CC:  Chief Complaint  Patient presents with  . Diabetes    Taking medication as prescribed     HPI: Teresa Barnes is a 75 y.o. female who presents to the office today for follow up of diabetes and problems listed above in the chief complaint.   Diabetes follow up: Her diabetic control is reported as Unchanged.  She continues on Victoza daily injections.  Denies symptoms of hyperglycemia. She denies exertional CP or SOB or symptomatic hypoglycemia. She denies foot sores or paresthesias.   Chronic kidney disease: Reports she saw her kidney doctor last week.  I do not yet have those notes for review.  She reports her renal function is stable.  They discussed CKD preventive medications.  She increased her losartan from 25 to 50 mg daily.  I also discussed SGLT2 inhibitors.  She agrees this would be a good choice but will defer change until after ensuring that she tolerates the losartan.  Patient feels well, has mild pedal edema at the end of the day.  No shortness of breath.  No nausea or vomiting.  Has chronic kidney disease anemia but has also had vitamin B12 and D deficiencies.  Those are both due for recheck.  She is on supplements.  Hyperlipidemia with goal LDL less than 70.  Tolerates her statin without adverse effects.  Nonfasting for recheck.   Wt Readings from Last 3 Encounters:  04/12/21 176 lb 3.2 oz (79.9 kg)  01/12/21 174 lb (78.9 kg)  12/19/20 174 lb (78.9 kg)    BP Readings from Last 3 Encounters:  04/12/21 134/76  01/12/21 122/60  12/19/20 128/82    Assessment  1. Hypertension associated with diabetes ([place])   2. CKD (chronic kidney disease) stage 4, GFR 15-29 ml/min (HCC)   3. Mixed hyperlipidemia   4. Type 2 diabetes mellitus with stage 4 chronic kidney disease, without long-term current use of insulin ([place])   5. Anemia of chronic disease   6. Vitamin B12 deficiency   7. Vitamin D deficiency      Plan   Diabetes has been very well controlled.  We  will recheck A1c today.  Continue Victoza for now.  When she returns in 3 months for physical, will change to SGLT2 inhibitor.  Patient agrees.  Chronic kidney disease: We will await renal records.  Recheck lab work today.  Mixed hyperlipidemia with due for recheck.  Has been well controlled.  No adverse effects.  Monitoring anemia and vitamin B12 and D deficiency numbers.  Will adjust supplements if needed.  She had been running high in the past.   Follow up: As scheduled in August for CPE. Orders Placed This Encounter  Procedures  . CBC with Differential/Platelet  . Iron, TIBC and Ferritin Panel  . B12 and Folate Panel  . Lipid panel  . Renal function panel  . Hepatic function panel  . TSH  . VITAMIN D 25 Hydroxy (Vit-D Deficiency, Fractures)  . Hemoglobin A1c   No orders of the defined types were placed in this encounter.     Immunization History  Administered Date(s) Administered  . Fluad Quad(high Dose 65+) 08/18/2019  . Influenza, High Dose Seasonal PF 08/18/2017, 07/15/2018, 08/14/2020  . Influenza,inj,Quad PF,6+ Mos 08/01/2016  . Influenza-Unspecified 08/18/2014  . Moderna Sars-Covid-2 Vaccination 01/01/2020, 01/29/2020, 09/25/2020  . Pneumococcal Conjugate-13 12/09/2017  . Pneumococcal Polysaccharide-23 09/24/2018  . Zoster Recombinat (Shingrix) 10/21/2018, 01/27/2019    Diabetes Related Lab Review: Lab Results  Component Value  Date   HGBA1C 6.0 (A) 01/12/2021   HGBA1C 6.0 (A) 08/09/2020   HGBA1C 5.9 (A) 05/03/2020    Lab Results  Component Value Date   MICROALBUR 8.1 (H) 03/09/2018   Lab Results  Component Value Date   CREATININE 2.63 (H) 05/30/2020   BUN 61 (H) 05/30/2020   NA 137 05/30/2020   K 4.6 05/30/2020   CL 104 05/30/2020   CO2 24 05/30/2020   Lab Results  Component Value Date   CHOL 171 05/03/2020   CHOL 134 08/18/2019   CHOL 180 05/17/2019   Lab Results  Component Value Date   HDL 52.80 05/03/2020   HDL 47.70 08/18/2019   HDL  48.70 05/17/2019   Lab Results  Component Value Date   LDLCALC 100 (H) 05/03/2020   LDLCALC 71 08/18/2019   LDLCALC 113 (H) 05/17/2019   Lab Results  Component Value Date   TRIG 91.0 05/03/2020   TRIG 78.0 08/18/2019   TRIG 90.0 05/17/2019   Lab Results  Component Value Date   CHOLHDL 3 05/03/2020   CHOLHDL 3 08/18/2019   CHOLHDL 4 05/17/2019   No results found for: LDLDIRECT The 10-year ASCVD risk score Mikey Bussing DC Jr., et al., 2013) is: 31.5%   Values used to calculate the score:     Age: 55 years     Sex: Female     Is Non-Hispanic African American: Yes     Diabetic: Yes     Tobacco smoker: No     Systolic Blood Pressure: 456 mmHg     Is BP treated: Yes     HDL Cholesterol: 52.8 mg/dL     Total Cholesterol: 171 mg/dL I have reviewed the PMH, Fam and Soc history. Patient Active Problem List   Diagnosis Date Noted  . Hypertensive kidney disease with CKD (chronic kidney disease) stage V ([place]) 01/12/2021    Priority: High  . Type 2 diabetes mellitus with stage 4 chronic kidney disease, without long-term current use of insulin ([place]) 08/09/2020    Priority: High  . CKD (chronic kidney disease) stage 4, GFR 15-29 ml/min (HCC) 05/31/2020    Priority: High    Normal ultrasound, 07/2020; referred to renal 05/2020   . Mixed hyperlipidemia 12/09/2017    Priority: High  . Hypertension associated with diabetes ([place]) 12/09/2017    Priority: High  . H/O: upper GI bleed 07/30/2016    Priority: High    Due to severe erosive esophagitis; 2017; healed with PPI by egd f/u; has [place] and stricture; s/p blood transfusion. Negative for Barretts esophagitis   . S/P cervical spinal fusion 03/26/2019    Priority: Medium    03/2019 severe spinal stenosis and cervical myelopathy   . Anemia of chronic disease 09/24/2018    Priority: Medium  . History of right breast cancer 12/09/2017    Priority: Medium    S/p mastectomy 1990   . Hiatal hernia 12/09/2017    Priority: Medium  . Esophageal  stricture 12/09/2017    Priority: Medium    S/p dilation by GI: Dr. Scarlette Shorts; due to h/o severe recurrent erosive esophagitis.   . Chronic gout 12/09/2017    Priority: Medium    On allopurinol   . Glaucoma 12/09/2017    Priority: Medium  . Degenerative arthritis of right knee 07/03/2017    Priority: Medium    Injected 07/03/2017' Injected August 05, 2018   . Right lumbar radiculopathy 01/23/2016    Priority: Medium  . Arthritis of right acromioclavicular  joint 01/02/2016    Priority: Medium    Injected 01/02/2016 Repeat injection 03/12/2016   . Greater trochanteric bursitis of right hip 11/17/2017    Priority: Low    Injected November 17, 2017 Repeat injection May 05, 2018   . Normochromic anemia     Priority: Low  . Acute bursitis of right shoulder 06/03/2019    Injected in June 03, 2019   . Adhesive bursitis of left shoulder 08/05/2018    Injected August 05, 2018     Social History: Patient  reports that she quit smoking about 27 years ago. Her smoking use included cigarettes. She has a 2.50 pack-year smoking history. She has never used smokeless tobacco. She reports that she does not drink alcohol and does not use drugs.  Review of Systems: Ophthalmic: negative for eye pain, loss of vision or double vision Cardiovascular: negative for chest pain Respiratory: negative for SOB or persistent cough Gastrointestinal: negative for abdominal pain Genitourinary: negative for dysuria or gross hematuria MSK: negative for foot lesions Neurologic: negative for weakness or gait disturbance  Objective  Vitals: BP 134/76   Pulse (!) 54   Temp 97.9 F (36.6 C) (Temporal)   Ht 5\' 4"  (1.626 m)   Wt 176 lb 3.2 oz (79.9 kg)   SpO2 100%   BMI 30.24 kg/m  General: well appearing, no acute distress  Psych:  Alert and oriented, normal mood and affect HEENT:  Normocephalic, atraumatic, moist mucous membranes, supple neck  Cardiovascular:  Nl S1 and S2, RRR without  murmur, gallop or rub. no edema, no rub Respiratory:  Good breath sounds bilaterally, CTAB with normal effort, no rales Extremities: No edema Foot exam: no erythema, pallor, or cyanosis visible nl proprioception and sensation to monofilament testing bilaterally, +2 distal pulses bilaterally    Diabetic education: ongoing education regarding chronic disease management for diabetes was given today. We continue to reinforce the ABC's of diabetic management: A1c (<7 or 8 dependent upon patient), tight blood pressure control, and cholesterol management with goal LDL < 100 minimally. We discuss diet strategies, exercise recommendations, medication options and possible side effects. At each visit, we review recommended immunizations and preventive care recommendations for diabetics and stress that good diabetic control can prevent other problems. See below for this patient's data.    Commons side effects, risks, benefits, and alternatives for medications and treatment plan prescribed today were discussed, and the patient expressed understanding of the given instructions. Patient is instructed to call or message via MyChart if he/she has any questions or concerns regarding our treatment plan. No barriers to understanding were identified. We discussed Red Flag symptoms and signs in detail. Patient expressed understanding regarding what to do in case of urgent or emergency type symptoms.   Medication list was reconciled, printed and provided to the patient in AVS. Patient instructions and summary information was reviewed with the patient as documented in the AVS. This note was prepared with assistance of Dragon voice recognition software. Occasional wrong-word or sound-a-like substitutions may have occurred due to the inherent limitations of voice recognition software  This visit occurred during the SARS-CoV-2 public health emergency.  Safety protocols were in place, including screening questions prior to the  visit, additional usage of staff PPE, and extensive cleaning of exam room while observing appropriate contact time as indicated for disinfecting solutions.

## 2021-04-12 NOTE — Patient Instructions (Signed)
Please follow up as scheduled for your next visit with me: 06/28/2021  We will switch your diabetes medication at that time to help your kidneys.   We will call you with your lab results.   If you have any questions or concerns, please don't hesitate to send me a message via MyChart or call the office at [phone]. Thank you for visiting with [place] today! It's our pleasure caring for you.

## 2021-04-13 LAB — IRON,TIBC AND FERRITIN PANEL
%SAT: 34 % (calc) (ref 16–45)
Ferritin: 27 ng/mL (ref 16–288)
Iron: 97 ug/dL (ref 45–160)
TIBC: 285 mcg/dL (calc) (ref 250–450)

## 2021-04-18 NOTE — Progress Notes (Signed)
Please call patient: I have reviewed his/her lab results. All lab results are stable. No changes are needed.

## 2021-05-04 ENCOUNTER — Other Ambulatory Visit: Payer: Self-pay | Admitting: Family Medicine

## 2021-05-14 ENCOUNTER — Encounter (INDEPENDENT_AMBULATORY_CARE_PROVIDER_SITE_OTHER): Payer: Medicare Other | Admitting: Ophthalmology

## 2021-06-11 DIAGNOSIS — Z9012 Acquired absence of left breast and nipple: Secondary | ICD-10-CM | POA: Diagnosis not present

## 2021-06-11 DIAGNOSIS — C50911 Malignant neoplasm of unspecified site of right female breast: Secondary | ICD-10-CM | POA: Diagnosis not present

## 2021-06-12 ENCOUNTER — Other Ambulatory Visit: Payer: Self-pay | Admitting: Family Medicine

## 2021-06-12 ENCOUNTER — Encounter: Payer: Self-pay | Admitting: Podiatry

## 2021-06-12 ENCOUNTER — Ambulatory Visit: Payer: Medicare Other | Admitting: Podiatry

## 2021-06-12 ENCOUNTER — Other Ambulatory Visit: Payer: Self-pay

## 2021-06-12 DIAGNOSIS — M79674 Pain in right toe(s): Secondary | ICD-10-CM

## 2021-06-12 DIAGNOSIS — B351 Tinea unguium: Secondary | ICD-10-CM

## 2021-06-12 DIAGNOSIS — M79675 Pain in left toe(s): Secondary | ICD-10-CM

## 2021-06-16 NOTE — Progress Notes (Signed)
Subjective: Teresa Barnes is a pleasant 75 y.o. female patient seen today for preventative diabetic foot care. She c/o elongated, painful, thick toenails that are difficult to trim. Pain interferes with ambulation. Aggravating factors include wearing enclosed shoe gear. Pain is relieved with periodic professional debridement.  Her daughter is present during today's visit. She did not check her blood glucose today.  PCP is Leamon Arnt, MD. Last visit was: 04/12/2021.  Allergies  Allergen Reactions   Prednisone Nausea And Vomiting   Tramadol Nausea Only  Objective: Physical Exam  General: Teresa Barnes is a pleasant 75 y.o. African American female, WD, WN in NAD. [name] x 3.   Vascular:  Capillary fill time to digits <3 seconds b/l lower extremities. Faintly palpable DP pulse(s) b/l lower extremities. Faintly palpable PT pulse(s) b/l lower extremities. Pedal hair absent. Lower extremity skin temperature gradient within normal limits. No edema noted b/l lower extremities.  Dermatological:  Pedal skin with normal turgor, texture and tone b/l lower extremities. No open wounds b/l lower extremities. No interdigital macerations b/l lower extremities. Toenails 1-5 b/l elongated, discolored, dystrophic, thickened, crumbly with subungual debris and tenderness to dorsal palpation. Incurvated nailplate bilateral border(s) R hallux.  Nail border hypertrophy absent. There is tenderness to palpation. Sign(s) of infection: no clinical signs of infection noted on examination today..  Musculoskeletal:  Normal muscle strength 5/5 to all lower extremity muscle groups bilaterally. No pain crepitus or joint limitation noted with ROM b/l lower extremities. No gross bony deformities b/l lower extremities.  Neurological:  Protective sensation intact 5/5 intact bilaterally with 10g monofilament b/l.  Assessment and Plan:  1. Pain due to onychomycosis of toenails of both feet   -Examined patient. -Continue  diabetic foot care principles. -Patient to continue soft, supportive shoe gear daily. -Discussed treatment options for onychomycosis. Patient opted for topical OTC therapy. Patient is to apply 1 drop of tea tree oil to affected toenail(s) once daily. -Toenails 1-5 b/l were debrided in length and girth with sterile nail nippers and dremel without iatrogenic bleeding.  -Offending nail border debrided and curretaged R hallux utilizing sterile nail nipper and currette. Border(s) cleansed with alcohol and triple antibiotic ointment applied. Patient instructed to apply Neosporin to R hallux once daily for 7 days. -Patient to report any pedal injuries to medical professional immediately. -Patient/POA to call should there be question/concern in the interim.  Return in about 3 months (around 09/12/2021).  Marzetta Board, DPM

## 2021-06-28 ENCOUNTER — Encounter: Payer: Medicare Other | Admitting: Family Medicine

## 2021-06-28 DIAGNOSIS — D638 Anemia in other chronic diseases classified elsewhere: Secondary | ICD-10-CM | POA: Diagnosis not present

## 2021-06-28 DIAGNOSIS — N1832 Chronic kidney disease, stage 3b: Secondary | ICD-10-CM | POA: Diagnosis not present

## 2021-07-19 ENCOUNTER — Telehealth: Payer: Self-pay

## 2021-07-19 DIAGNOSIS — E1122 Type 2 diabetes mellitus with diabetic chronic kidney disease: Secondary | ICD-10-CM | POA: Diagnosis not present

## 2021-07-19 DIAGNOSIS — R3129 Other microscopic hematuria: Secondary | ICD-10-CM | POA: Diagnosis not present

## 2021-07-19 DIAGNOSIS — K2210 Ulcer of esophagus without bleeding: Secondary | ICD-10-CM | POA: Diagnosis not present

## 2021-07-19 DIAGNOSIS — N2581 Secondary hyperparathyroidism of renal origin: Secondary | ICD-10-CM | POA: Diagnosis not present

## 2021-07-19 DIAGNOSIS — N184 Chronic kidney disease, stage 4 (severe): Secondary | ICD-10-CM | POA: Diagnosis not present

## 2021-07-19 DIAGNOSIS — D638 Anemia in other chronic diseases classified elsewhere: Secondary | ICD-10-CM | POA: Diagnosis not present

## 2021-07-19 DIAGNOSIS — I129 Hypertensive chronic kidney disease with stage 1 through stage 4 chronic kidney disease, or unspecified chronic kidney disease: Secondary | ICD-10-CM | POA: Diagnosis not present

## 2021-07-19 NOTE — Telephone Encounter (Signed)
Dr. Leandrew Koyanagi from [facility] just saw the patient and is wanting to coordinate  her care and would to like a call back to discuss. Call back number [phone].

## 2021-07-24 ENCOUNTER — Telehealth: Payer: Self-pay

## 2021-07-24 NOTE — Telephone Encounter (Signed)
Spoke to Dr. Royce Macadamia: Wilder Glade 5 and rec stopping victoza. We will notify pt.

## 2021-07-24 NOTE — Telephone Encounter (Signed)
Patient calling stating she needs to talk to the [facility] abut some of her medications before she starts them from her other doctor.

## 2021-07-24 NOTE — Telephone Encounter (Signed)
Spoke with patient regarding medication change, is going to start the [place] and stop victoza. Will get scheduled for a diabetes follow up.

## 2021-07-30 ENCOUNTER — Encounter: Payer: Self-pay | Admitting: Family Medicine

## 2021-07-30 ENCOUNTER — Other Ambulatory Visit: Payer: Self-pay

## 2021-07-30 ENCOUNTER — Encounter (INDEPENDENT_AMBULATORY_CARE_PROVIDER_SITE_OTHER): Payer: Medicare Other | Admitting: Ophthalmology

## 2021-07-30 DIAGNOSIS — E113593 Type 2 diabetes mellitus with proliferative diabetic retinopathy without macular edema, bilateral: Secondary | ICD-10-CM

## 2021-07-30 DIAGNOSIS — I10 Essential (primary) hypertension: Secondary | ICD-10-CM | POA: Diagnosis not present

## 2021-07-30 DIAGNOSIS — H338 Other retinal detachments: Secondary | ICD-10-CM | POA: Diagnosis not present

## 2021-07-30 DIAGNOSIS — H43811 Vitreous degeneration, right eye: Secondary | ICD-10-CM | POA: Diagnosis not present

## 2021-07-30 DIAGNOSIS — H35033 Hypertensive retinopathy, bilateral: Secondary | ICD-10-CM | POA: Diagnosis not present

## 2021-07-30 LAB — HM DIABETES EYE EXAM

## 2021-08-06 ENCOUNTER — Other Ambulatory Visit: Payer: Self-pay

## 2021-08-06 ENCOUNTER — Encounter: Payer: Self-pay | Admitting: Family Medicine

## 2021-08-06 ENCOUNTER — Ambulatory Visit (INDEPENDENT_AMBULATORY_CARE_PROVIDER_SITE_OTHER): Payer: Medicare Other | Admitting: Family Medicine

## 2021-08-06 VITALS — BP 140/78 | HR 50 | Temp 98.2°F | Ht 64.0 in | Wt 175.4 lb

## 2021-08-06 DIAGNOSIS — Z23 Encounter for immunization: Secondary | ICD-10-CM | POA: Diagnosis not present

## 2021-08-06 DIAGNOSIS — R29898 Other symptoms and signs involving the musculoskeletal system: Secondary | ICD-10-CM

## 2021-08-06 DIAGNOSIS — E782 Mixed hyperlipidemia: Secondary | ICD-10-CM

## 2021-08-06 DIAGNOSIS — E1159 Type 2 diabetes mellitus with other circulatory complications: Secondary | ICD-10-CM | POA: Diagnosis not present

## 2021-08-06 DIAGNOSIS — I152 Hypertension secondary to endocrine disorders: Secondary | ICD-10-CM

## 2021-08-06 DIAGNOSIS — D638 Anemia in other chronic diseases classified elsewhere: Secondary | ICD-10-CM

## 2021-08-06 DIAGNOSIS — N184 Chronic kidney disease, stage 4 (severe): Secondary | ICD-10-CM

## 2021-08-06 DIAGNOSIS — E1122 Type 2 diabetes mellitus with diabetic chronic kidney disease: Secondary | ICD-10-CM

## 2021-08-06 LAB — POCT GLYCOSYLATED HEMOGLOBIN (HGB A1C): Hemoglobin A1C: 6.3 % — AB (ref 4.0–5.6)

## 2021-08-06 NOTE — Progress Notes (Signed)
Subjective  CC:  Chief Complaint  Patient presents with   Hypertension   Diabetes    HPI: Teresa Barnes is a 75 y.o. female who presents to the office today for follow up of diabetes and problems listed above in the chief complaint.  Diabetes follow up: Her diabetic control is reported as Unchanged. However, she has stopped the victoza and has started the farxiga 5. Tolerating well. She denies exertional CP or SOB or symptomatic hypoglycemia. She denies foot sores or paresthesias.  HTN: has been well controlled. Recently nl at renal. Mildly elevated today due to being nervous.  HLD at goal on statin.  H/o weakness and requests pt.   Wt Readings from Last 3 Encounters:  08/06/21 175 lb 6.4 oz (79.6 kg)  04/12/21 176 lb 3.2 oz (79.9 kg)  01/12/21 174 lb (78.9 kg)    BP Readings from Last 3 Encounters:  08/06/21 140/78  04/12/21 134/76  01/12/21 122/60    Assessment  1. Type 2 diabetes mellitus with stage 4 chronic kidney disease, without long-term current use of insulin ([place])   2. CKD (chronic kidney disease) stage 4, GFR 15-29 ml/min (HCC)   3. Hypertension associated with diabetes ([place])   4. Anemia of chronic disease   5. Mixed hyperlipidemia   6. Weakness of lower extremity, unspecified laterality      Plan  Diabetes is currently very well controlled. Will continue farxiga 5 daily and recheck 3 months; will titrate dose at that time if needed. CKD on farxiga and working with renal. Chronic renal anemia Refer to [facility] for water therapy for strengthening Bp: has been controlled. Continue losartan 50 dialy.  HLD at goal on statin.  Flu shot today.  HM up to date.   Follow up: 3 months for dm. Orders Placed This Encounter  Procedures   Ambulatory referral to Physical Therapy   POCT HgB A1C   No orders of the defined types were placed in this encounter.     Immunization History  Administered Date(s) Administered   Fluad Quad(high Dose 65+) 08/18/2019    Influenza, High Dose Seasonal PF 08/18/2017, 07/15/2018, 08/14/2020   Influenza,inj,Quad PF,6+ Mos 08/01/2016   Influenza-Unspecified 08/18/2014   Moderna Sars-Covid-2 Vaccination 01/01/2020, 01/29/2020, 09/25/2020   Pneumococcal Conjugate-13 12/09/2017   Pneumococcal Polysaccharide-23 09/24/2018   Zoster Recombinat (Shingrix) 10/21/2018, 01/27/2019    Diabetes Related Lab Review: Lab Results  Component Value Date   HGBA1C 6.3 (A) 08/06/2021   HGBA1C 6.4 04/12/2021   HGBA1C 6.0 (A) 01/12/2021    Lab Results  Component Value Date   MICROALBUR 8.1 (H) 03/09/2018   Lab Results  Component Value Date   CREATININE 1.56 (H) 04/12/2021   BUN 37 (H) 04/12/2021   NA 141 04/12/2021   K 4.6 04/12/2021   CL 109 04/12/2021   CO2 25 04/12/2021   Lab Results  Component Value Date   CHOL 150 04/12/2021   CHOL 171 05/03/2020   CHOL 134 08/18/2019   Lab Results  Component Value Date   HDL 57.50 04/12/2021   HDL 52.80 05/03/2020   HDL 47.70 08/18/2019   Lab Results  Component Value Date   LDLCALC 83 04/12/2021   LDLCALC 100 (H) 05/03/2020   LDLCALC 71 08/18/2019   Lab Results  Component Value Date   TRIG 49.0 04/12/2021   TRIG 91.0 05/03/2020   TRIG 78.0 08/18/2019   Lab Results  Component Value Date   CHOLHDL 3 04/12/2021   CHOLHDL 3  05/03/2020   CHOLHDL 3 08/18/2019   No results found for: LDLDIRECT The 10-year ASCVD risk score (Arnett DK, et al., 2019) is: 31%   Values used to calculate the score:     Age: 31 years     Sex: Female     Is Non-Hispanic African American: Yes     Diabetic: Yes     Tobacco smoker: No     Systolic Blood Pressure: 124 mmHg     Is BP treated: Yes     HDL Cholesterol: 57.5 mg/dL     Total Cholesterol: 150 mg/dL I have reviewed the PMH, Fam and Soc history. Patient Active Problem List   Diagnosis Date Noted   Type 2 diabetes mellitus with stage 4 chronic kidney disease, without long-term current use of insulin ([place]) 08/09/2020     Priority: High   CKD (chronic kidney disease) stage 4, GFR 15-29 ml/min ([place]) 05/31/2020    Priority: High    Normal ultrasound, 07/2020; referred to renal 05/2020    Mixed hyperlipidemia 12/09/2017    Priority: High   Hypertension associated with diabetes ([place]) 12/09/2017    Priority: High   H/O: upper GI bleed 07/30/2016    Priority: High    Due to severe erosive esophagitis; 2017; healed with PPI by egd f/u; has [place] and stricture; s/p blood transfusion. Negative for Barretts esophagitis    S/P cervical spinal fusion 03/26/2019    Priority: Medium    03/2019 severe spinal stenosis and cervical myelopathy    Anemia of chronic disease 09/24/2018    Priority: Medium   History of right breast cancer 12/09/2017    Priority: Medium    S/p mastectomy 1990    Hiatal hernia 12/09/2017    Priority: Medium   Esophageal stricture 12/09/2017    Priority: Medium    S/p dilation by GI: Dr. Scarlette Shorts; due to h/o severe recurrent erosive esophagitis.    Chronic gout 12/09/2017    Priority: Medium    On allopurinol    Glaucoma 12/09/2017    Priority: Medium   Degenerative arthritis of right knee 07/03/2017    Priority: Medium    Injected 07/03/2017' Injected August 05, 2018    Right lumbar radiculopathy 01/23/2016    Priority: Medium   Arthritis of right acromioclavicular joint 01/02/2016    Priority: Medium    Injected 01/02/2016 Repeat injection 03/12/2016    Greater trochanteric bursitis of right hip 11/17/2017    Priority: Low    Injected November 17, 2017 Repeat injection May 05, 2018    Acute bursitis of right shoulder 06/03/2019    Injected in June 03, 2019    Adhesive bursitis of left shoulder 08/05/2018    Injected August 05, 2018     Social History: Patient  reports that she quit smoking about 28 years ago. Her smoking use included cigarettes. She has a 2.50 pack-year smoking history. She has never used smokeless tobacco. She reports that she does not drink  alcohol and does not use drugs.  Review of Systems: Ophthalmic: negative for eye pain, loss of vision or double vision Cardiovascular: negative for chest pain Respiratory: negative for SOB or persistent cough Gastrointestinal: negative for abdominal pain Genitourinary: negative for dysuria or gross hematuria MSK: negative for foot lesions Neurologic: negative for weakness or gait disturbance  Objective  Vitals: BP 140/78   Pulse (!) 50   Temp 98.2 F (36.8 C) (Temporal)   Ht 5\' 4"  (1.626 m)   Wt 175 lb  6.4 oz (79.6 kg)   SpO2 97%   BMI 30.11 kg/m  General: well appearing, no acute distress  Psych:  Alert and oriented, normal mood and affect HEENT:  Normocephalic, atraumatic, moist mucous membranes, supple neck  Cardiovascular:  Nl S1 and S2, RRR without murmur, gallop or rub. no edema     Diabetic education: ongoing education regarding chronic disease management for diabetes was given today. We continue to reinforce the ABC's of diabetic management: A1c (<7 or 8 dependent upon patient), tight blood pressure control, and cholesterol management with goal LDL < 100 minimally. We discuss diet strategies, exercise recommendations, medication options and possible side effects. At each visit, we review recommended immunizations and preventive care recommendations for diabetics and stress that good diabetic control can prevent other problems. See below for this patient's data.   Commons side effects, risks, benefits, and alternatives for medications and treatment plan prescribed today were discussed, and the patient expressed understanding of the given instructions. Patient is instructed to call or message via MyChart if he/she has any questions or concerns regarding our treatment plan. No barriers to understanding were identified. We discussed Red Flag symptoms and signs in detail. Patient expressed understanding regarding what to do in case of urgent or emergency type symptoms.  Medication  list was reconciled, printed and provided to the patient in AVS. Patient instructions and summary information was reviewed with the patient as documented in the AVS. This note was prepared with assistance of Dragon voice recognition software. Occasional wrong-word or sound-a-like substitutions may have occurred due to the inherent limitations of voice recognition software  This visit occurred during the SARS-CoV-2 public health emergency.  Safety protocols were in place, including screening questions prior to the visit, additional usage of staff PPE, and extensive cleaning of exam room while observing appropriate contact time as indicated for disinfecting solutions.

## 2021-08-06 NOTE — Patient Instructions (Signed)
Please return in 3 months for diabetes follow up   I have placed a referral to [facility] for water physical therapy at [place]. They should call you to get you set up.   If you have any questions or concerns, please don't hesitate to send me a message via MyChart or call the office at [phone]. Thank you for visiting with [place] today! It's our pleasure caring for you.

## 2021-08-10 ENCOUNTER — Encounter (HOSPITAL_BASED_OUTPATIENT_CLINIC_OR_DEPARTMENT_OTHER): Payer: Self-pay | Admitting: Physical Therapy

## 2021-08-10 ENCOUNTER — Ambulatory Visit (HOSPITAL_BASED_OUTPATIENT_CLINIC_OR_DEPARTMENT_OTHER): Payer: Medicare Other | Attending: Family Medicine | Admitting: Physical Therapy

## 2021-08-10 DIAGNOSIS — R262 Difficulty in walking, not elsewhere classified: Secondary | ICD-10-CM | POA: Diagnosis not present

## 2021-08-10 DIAGNOSIS — M6281 Muscle weakness (generalized): Secondary | ICD-10-CM | POA: Diagnosis not present

## 2021-08-10 NOTE — Therapy (Signed)
OUTPATIENT PHYSICAL THERAPY LOWER EXTREMITY EVALUATION   Patient Name: Teresa Barnes MRN: [id number] DOB:08/25/1946, 75 y.o., female Today's Date: 08/10/2021  PCP: Leamon Arnt, MD REFERRING PROVIDER: Leamon Arnt, MD   PT End of Session - 08/10/21 1322     Visit Number 1    Number of Visits 17    Date for PT Re-Evaluation 10/05/21    Authorization Type UHC MCR Adv    Progress Note Due on Visit 10    PT Start Time 1315    PT Stop Time 1355    PT Time Calculation (min) 40 min    Activity Tolerance Patient tolerated treatment well    Behavior During Therapy [facility] for tasks assessed/performed             Past Medical History:  Diagnosis Date   Anemia    Anemia of chronic disease 09/24/2018   Anxiety    Blood transfusion without reported diagnosis 07/2016   Cancer ([place]) 1991   breast   Chronic gout 12/09/2017   On allopurinol   Diabetes mellitus without complication ([place])    Esophageal stricture 12/09/2017   S/p dilation by GI: Dr. Scarlette Shorts; due to h/o severe recurrent erosive esophagitis.   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   Injected left foot every 19 2018   GERD (gastroesophageal reflux disease)    Glaucoma    Hiatal hernia 12/09/2017   Hyperlipidemia    Hypertension    Right rotator cuff tear 11/24/2015   Injected this and 26 2017   Wears glasses    Past Surgical History:  Procedure Laterality Date   ANTERIOR CERVICAL DECOMP/DISCECTOMY FUSION N/A 03/26/2019   Procedure: Anterior Cervical Discectomy Fusion - Cervical Three-Cervical Four - Cervical Four-Cervical Five - Cervical Five-Cervical Six;  Surgeon: Eustace Moore, MD;  Location: [place];  Service: Neurosurgery;  Laterality: N/A;  Anterior Cervical Discectomy Fusion - Cervical Three-Cervical Four - Cervical Four-Cervical Five - Cervical Five-Cervical Six   CATARACT EXTRACTION W/ INTRAOCULAR LENS  IMPLANT, BILATERAL  2000   COLONOSCOPY     cyst on spine     ESOPHAGOGASTRODUODENOSCOPY (EGD) WITH PROPOFOL  N/A 07/31/2016   Procedure: ESOPHAGOGASTRODUODENOSCOPY (EGD) WITH PROPOFOL;  Surgeon: Manus Gunning, MD;  [facility];  Service: Gastroenterology;  Laterality: N/A;   EYE SURGERY  2011   lt eye blled   MASTECTOMY MODIFIED RADICAL Right 1990   REDUCTION MAMMAPLASTY Left    TRIGGER FINGER RELEASE Right 06/24/2013   Procedure: RELEASE TRIGGER FINGER/A-1 PULLEY PIP RIGHT LONG FINGER;  Surgeon: Cammie Sickle., MD;  Location: [place];  Service: Orthopedics;  Laterality: Right;   TRIGGER FINGER RELEASE     TUBAL LIGATION     Patient Active Problem List   Diagnosis Date Noted   Type 2 diabetes mellitus with stage 4 chronic kidney disease, without long-term current use of insulin ([place]) 08/09/2020   CKD (chronic kidney disease) stage 4, GFR 15-29 ml/min ([place]) 05/31/2020   Acute bursitis of right shoulder 06/03/2019   S/P cervical spinal fusion 03/26/2019   Anemia of chronic disease 09/24/2018   Adhesive bursitis of left shoulder 08/05/2018   History of right breast cancer 12/09/2017   Mixed hyperlipidemia 12/09/2017   Hypertension associated with diabetes ([place]) 12/09/2017   Hiatal hernia 12/09/2017   Esophageal stricture 12/09/2017   Chronic gout 12/09/2017   Glaucoma 12/09/2017   Greater trochanteric bursitis of right hip 11/17/2017   Degenerative arthritis of right knee 07/03/2017  H/O: upper GI bleed 07/30/2016   Right lumbar radiculopathy 01/23/2016   Arthritis of right acromioclavicular joint 01/02/2016    ONSET DATE: 2 years ago after surgery in neck  REFERRING DIAG: R29.898 (ICD-10-CM) - Weakness of lower extremity, unspecified laterality   THERAPY DIAG:  Muscle weakness (generalized)  Difficulty in walking, not elsewhere classified  SUBJECTIVE:   SUBJECTIVE STATEMENT: Had 3 bone spurs in my neck and surgery resulted in weakness in Rt leg. Referred for aquatic therapy. I feel like I am not steady without my walker. Was not using an AD  before surgery.                                                                                                                                                                                                              PERTINENT HISTORY: T2DM, h/o CA, chronic gout, h/o Rt hip/knee pain, Rt lumbar radiculopathy  PAIN:  Are you having pain? No   PRECAUTIONS: Fall  WEIGHT BEARING RESTRICTIONS No  FALLS: Has patient fallen in last 6 months? No  LIVING ENVIRONMENT: Lives with: lives with their spouse Lives in: House/apartment Stairs: Yes; has a multilevel home but everything is downstairs so I don't use them Has following equipment at home: Single point cane, Quad cane small base, and Walker - 4 wheeled  PLOF: Independent  PATIENT GOALS walk without AD   OBJECTIVE:    PATIENT SURVEYS:  LEFS 27  COGNITION:  Overall cognitive status: Within functional limits for tasks assessed     SENSATION:  WFL   MUSCLE LENGTH: Gross limitations in LE flexibility: HS, quads, hip flexors, DF     LE AROM/PROM:  A/PROM Right 08/10/2021 Left 08/10/2021  Hip flexion    Hip abduction    Hip adduction    Hip extension    Hip internal rotation    Hip external rotation    Knee flexion    Knee extension    Ankle dorsiflexion    Ankle plantarflexion    Ankle inversion    Ankle eversion     (Blank rows = not tested)  LE MMT:  MMT Right 08/10/2021 Left 08/10/2021  Hip flexion 5/5 5/5  Hip abduction    Hip adduction    Hip extension    Hip internal rotation    Hip external rotation    Knee flexion 4/5 4/5  Knee extension 5/5 5/5  Ankle dorsiflexion 5/5 5/5  Ankle plantarflexion    Ankle inversion    Ankle eversion     (Blank rows =  not tested)     FUNCTIONAL TESTS:  5 times sit to stand: 24s, legs pressing against chair Berg Balance Scale: 25  GAIT: Distance walked: about 50 feet from check in to clinic Assistive device utilized: Walker - 4 wheeled Level of  assistance: Modified independence Comments: shuffling heard from bil feet    Today's Treatment:  All time taken for evaluation   PATIENT EDUCATION:  Education details: anatomy of condition, POC, HEP, exercise form/rationale Person educated: Patient Education method: Explanation, Demonstration, Tactile cues, Verbal cues, and Handouts Education comprehension: verbalized understanding, returned demonstration, verbal cues required, tactile cues required, and needs further education   HOME EXERCISE PROGRAM: To be established from pool  ASSESSMENT:  CLINICAL IMPRESSION: Patient is a 75 y.o. F who was seen today for physical therapy evaluation and treatment for weakness of LEs. Objective impairments include Abnormal gait, decreased activity tolerance, decreased balance, decreased endurance, decreased mobility, difficulty walking, decreased strength, decreased safety awareness, impaired flexibility, and postural dysfunction. These impairments are limiting patient from cleaning, community activity, driving, meal prep, and shopping. Personal factors including Past/current experiences, Time since onset of injury/illness/exacerbation, and 3+ comorbidities: h/o Rt knee and hip pain, T2DM, chronic gout  are also affecting patient's functional outcome. Patient will benefit from skilled PT to address above impairments and improve overall function.  Pt is very fearful of falling and is guarded in her motion when she is not using AD. She demo shuffling gait and takes only half steps wihtout AD and tries to find things to hold on to for balance. She agrees that fear is significantly contributing to her balance difficulty.   REHAB POTENTIAL: Good  CLINICAL DECISION MAKING: Unstable/unpredictable  EVALUATION COMPLEXITY: High   GOALS: Goals reviewed with patient? Yes  SHORT TERM GOALS:  STG Name Target Date Goal status  1 Pt will be able to navigate stairs in and out of the pool independently with  use of hand rails Baseline: expected to require assistance in her first visit for learning & confidence 08/31/21 INITIAL  2 Pt will be able to demo high stepping gait pattern in pool, with noodle support at least one lap across pool without LOB Baseline: will evaluate in pool, consistent shuffling on land for fear of falling 08/31/21  INITIAL                            LONG TERM GOALS:   LTG Name Target Date Goal status  1 5TSTS to improve to at least 18s Baseline: 24s at eval 10/05/21 INITIAL  2 BERG to improve by [place] Baseline: 25/56 at eval 10/05/21 INITIAL  3 Pt will feel confident in her ability to walk household distances with [facility] or less Baseline: feels that she needs 4WW at eval 10/05/21 INITIAL                       PLAN: PT FREQUENCY: 1-2x/week  PT DURATION: 8 weeks  PLANNED INTERVENTIONS: Therapeutic exercises, Therapeutic activity, Neuro Muscular re-education, Balance training, Gait training, Patient/Family education, Joint mobilization, Stair training, Aquatic Therapy, Cryotherapy, Moist heat, Taping, and Manual therapy  PLAN FOR NEXT SESSION: begin aquatic exercises  Zierra Laroque C. Davis Vannatter PT, DPT 08/10/21 2:21 PM

## 2021-08-13 ENCOUNTER — Telehealth (HOSPITAL_BASED_OUTPATIENT_CLINIC_OR_DEPARTMENT_OTHER): Payer: Self-pay | Admitting: Physical Therapy

## 2021-08-13 ENCOUNTER — Ambulatory Visit (HOSPITAL_BASED_OUTPATIENT_CLINIC_OR_DEPARTMENT_OTHER): Payer: Medicare Other | Admitting: Physical Therapy

## 2021-08-13 NOTE — Telephone Encounter (Signed)
Spoke with pt as she has not come for scheduled visit this am.  She states she 'told them" 8:15 appt is too early for her.  She will be coming to next visit on 08-15-21 @ 430p.

## 2021-08-15 ENCOUNTER — Ambulatory Visit (HOSPITAL_BASED_OUTPATIENT_CLINIC_OR_DEPARTMENT_OTHER): Payer: Medicare Other | Admitting: Physical Therapy

## 2021-08-16 ENCOUNTER — Other Ambulatory Visit (HOSPITAL_BASED_OUTPATIENT_CLINIC_OR_DEPARTMENT_OTHER): Payer: Self-pay

## 2021-08-16 ENCOUNTER — Ambulatory Visit: Payer: Medicare Other | Attending: Internal Medicine

## 2021-08-16 DIAGNOSIS — Z23 Encounter for immunization: Secondary | ICD-10-CM

## 2021-08-16 MED ORDER — MODERNA COVID-19 BIVAL BOOSTER 50 MCG/0.5ML IM SUSP
INTRAMUSCULAR | 0 refills | Status: DC
  Filled 2021-08-16: qty 0.5, 1d supply, fill #0

## 2021-08-16 NOTE — Progress Notes (Signed)
   Covid-19 Vaccination Clinic  Name:  Teresa Barnes    MRN: [id number] DOB: 1946-05-20  08/16/2021  Teresa Barnes was observed post Covid-19 immunization for 15 minutes without incident. She was provided with Vaccine Information Sheet and instruction to access the V-Safe system.   Teresa Barnes was instructed to call 911 with any severe reactions post vaccine: Difficulty breathing  Swelling of face and throat  A fast heartbeat  A bad rash all over body  Dizziness and weakness

## 2021-08-20 DIAGNOSIS — N184 Chronic kidney disease, stage 4 (severe): Secondary | ICD-10-CM | POA: Diagnosis not present

## 2021-08-22 ENCOUNTER — Encounter (HOSPITAL_BASED_OUTPATIENT_CLINIC_OR_DEPARTMENT_OTHER): Payer: Self-pay | Admitting: Physical Therapy

## 2021-08-22 ENCOUNTER — Other Ambulatory Visit: Payer: Self-pay

## 2021-08-22 ENCOUNTER — Ambulatory Visit (HOSPITAL_BASED_OUTPATIENT_CLINIC_OR_DEPARTMENT_OTHER): Payer: Medicare Other | Attending: Family Medicine | Admitting: Physical Therapy

## 2021-08-22 DIAGNOSIS — G959 Disease of spinal cord, unspecified: Secondary | ICD-10-CM | POA: Insufficient documentation

## 2021-08-22 DIAGNOSIS — R262 Difficulty in walking, not elsewhere classified: Secondary | ICD-10-CM | POA: Diagnosis not present

## 2021-08-22 DIAGNOSIS — M6281 Muscle weakness (generalized): Secondary | ICD-10-CM | POA: Diagnosis not present

## 2021-08-22 NOTE — Therapy (Signed)
OUTPATIENT PHYSICAL THERAPY TREATMENT NOTE   Patient Name: Teresa Barnes MRN: [id number] DOB:[birth year], 75 y.o., female Today's Date: 08/22/2021  PCP: Leamon Arnt, MD REFERRING PROVIDER: Leamon Arnt, MD   PT End of Session - 08/22/21 1515     Visit Number 2    Number of Visits 17    Date for PT Re-Evaluation 10/05/21    Authorization Type UHC MCR Adv    Progress Note Due on Visit 10    PT Start Time 1516    PT Stop Time 1556    PT Time Calculation (min) 40 min    Activity Tolerance Patient tolerated treatment well    Behavior During Therapy [facility] for tasks assessed/performed             Past Medical History:  Diagnosis Date   Anemia    Anemia of chronic disease 09/24/2018   Anxiety    Blood transfusion without reported diagnosis 07/2016   Cancer ([place]) 1991   breast   Chronic gout 12/09/2017   On allopurinol   Diabetes mellitus without complication ([place])    Esophageal stricture 12/09/2017   S/p dilation by GI: Dr. Scarlette Shorts; due to h/o severe recurrent erosive esophagitis.   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   Injected left foot every 19 2018   GERD (gastroesophageal reflux disease)    Glaucoma    Hiatal hernia 12/09/2017   Hyperlipidemia    Hypertension    Right rotator cuff tear 11/24/2015   Injected this and 26 2017   Wears glasses    Past Surgical History:  Procedure Laterality Date   ANTERIOR CERVICAL DECOMP/DISCECTOMY FUSION N/A 03/26/2019   Procedure: Anterior Cervical Discectomy Fusion - Cervical Three-Cervical Four - Cervical Four-Cervical Five - Cervical Five-Cervical Six;  Surgeon: Eustace Moore, MD;  Location: [place];  Service: Neurosurgery;  Laterality: N/A;  Anterior Cervical Discectomy Fusion - Cervical Three-Cervical Four - Cervical Four-Cervical Five - Cervical Five-Cervical Six   CATARACT EXTRACTION W/ INTRAOCULAR LENS  IMPLANT, BILATERAL  2000   COLONOSCOPY     cyst on spine     ESOPHAGOGASTRODUODENOSCOPY (EGD) WITH PROPOFOL N/A  07/31/2016   Procedure: ESOPHAGOGASTRODUODENOSCOPY (EGD) WITH PROPOFOL;  Surgeon: Manus Gunning, MD;  [facility];  Service: Gastroenterology;  Laterality: N/A;   EYE SURGERY  2011   lt eye blled   MASTECTOMY MODIFIED RADICAL Right 1990   REDUCTION MAMMAPLASTY Left    TRIGGER FINGER RELEASE Right 06/24/2013   Procedure: RELEASE TRIGGER FINGER/A-1 PULLEY PIP RIGHT LONG FINGER;  Surgeon: Cammie Sickle., MD;  Location: [place];  Service: Orthopedics;  Laterality: Right;   TRIGGER FINGER RELEASE     TUBAL LIGATION     Patient Active Problem List   Diagnosis Date Noted   Type 2 diabetes mellitus with stage 4 chronic kidney disease, without long-term current use of insulin ([place]) 08/09/2020   CKD (chronic kidney disease) stage 4, GFR 15-29 ml/min ([place]) 05/31/2020   Acute bursitis of right shoulder 06/03/2019   S/P cervical spinal fusion 03/26/2019   Anemia of chronic disease 09/24/2018   Adhesive bursitis of left shoulder 08/05/2018   History of right breast cancer 12/09/2017   Mixed hyperlipidemia 12/09/2017   Hypertension associated with diabetes ([place]) 12/09/2017   Hiatal hernia 12/09/2017   Esophageal stricture 12/09/2017   Chronic gout 12/09/2017   Glaucoma 12/09/2017   Greater trochanteric bursitis of right hip 11/17/2017   Degenerative arthritis of right knee 07/03/2017   H/O:  upper GI bleed 07/30/2016   Right lumbar radiculopathy 01/23/2016   Arthritis of right acromioclavicular joint 01/02/2016    REFERRING DIAG: R29.898 (ICD-10-CM) - Weakness of lower extremity, unspecified laterality   THERAPY DIAG:  Muscle weakness (generalized)  Difficulty in walking, not elsewhere classified  PERTINENT HISTORY: T2DM, h/o CA, chronic gout, h/o Rt hip/knee pain, Rt lumbar radiculopathy  PRECAUTIONS: fall  SUBJECTIVE: Feeling pretty good. Has a small floor bike at home and will try to use it tonight  PAIN:  Are you having pain?  No     OBJECTIVE:  PATIENT SURVEYS:  LEFS 27   COGNITION:            Overall cognitive status: Within functional limits for tasks assessed                          SENSATION:            WFL     MUSCLE LENGTH: Gross limitations in LE flexibility: HS, quads, hip flexors, DF         LE AROM/PROM:   A/PROM Right 08/10/2021 Left 08/10/2021  Hip flexion      Hip abduction      Hip adduction      Hip extension      Hip internal rotation      Hip external rotation      Knee flexion      Knee extension      Ankle dorsiflexion      Ankle plantarflexion      Ankle inversion      Ankle eversion       (Blank rows = not tested)   LE MMT:   MMT Right 08/10/2021 Left 08/10/2021  Hip flexion 5/5 5/5  Hip abduction      Hip adduction      Hip extension      Hip internal rotation      Hip external rotation      Knee flexion 4/5 4/5  Knee extension 5/5 5/5  Ankle dorsiflexion 5/5 5/5  Ankle plantarflexion      Ankle inversion      Ankle eversion       (Blank rows = not tested)         FUNCTIONAL TESTS:  5 times sit to stand: 24s, legs pressing against chair Berg Balance Scale: 25   GAIT: Distance walked: about 50 feet from check in to clinic Assistive device utilized: Environmental consultant - 4 wheeled Level of assistance: Modified independence Comments: shuffling heard from bil feet  TODAY'S TREATMENT: 10/5  Stationary bike 5 min L1.7  Gait training- incr step height and heel strike with RW  Standing glut sets  Standing hamstring curls  Standing hip abduction  4" step taps  Standing hip extension in bar plank  Heel raises followed by gastroc stretch  PATIENT EDUCATION:  Education details: exercise form/rationale Person educated: Patient Education method: Consulting civil engineer, Demonstration, Tactile cues, Verbal cues, and Handouts Education comprehension: verbalized understanding, returned demonstration, verbal cues required, tactile cues required, and needs further education      HOME EXERCISE PROGRAM: [id number]   ASSESSMENT:   CLINICAL IMPRESSION: Pt denied aquatic treatments for fear of getting sick. Lacking knee flexion in swing through resulting in grabbing of Rt toes when walking- shoe is rubbed from doing this for so long. Good tolerance to exercise today with fatigue reported as expected.    REHAB POTENTIAL: Good   CLINICAL DECISION MAKING:  Unstable/unpredictable   EVALUATION COMPLEXITY: High     GOALS: Goals reviewed with patient? Yes   SHORT TERM GOALS:   STG Name Target Date Goal status  1 Pt will be able to navigate stairs in and out of the pool independently with use of hand rails Baseline: expected to require assistance in her first visit for learning & confidence 08/31/21 INITIAL  2 Pt will be able to demo high stepping gait pattern in pool, with noodle support at least one lap across pool without LOB Baseline: will evaluate in pool, consistent shuffling on land for fear of falling 08/31/21   INITIAL                                                 LONG TERM GOALS:    LTG Name Target Date Goal status  1 5TSTS to improve to at least 18s Baseline: 24s at eval 10/05/21 INITIAL  2 BERG to improve by [place] Baseline: 25/56 at eval 10/05/21 INITIAL  3 Pt will feel confident in her ability to walk household distances with [facility] or less Baseline: feels that she needs 4WW at eval 10/05/21 INITIAL                                        PLAN: PT FREQUENCY: 1-2x/week   PT DURATION: 8 weeks   PLANNED INTERVENTIONS: Therapeutic exercises, Therapeutic activity, Neuro Muscular re-education, Balance training, Gait training, Patient/Family education, Joint mobilization, Stair training, Aquatic Therapy, Cryotherapy, Moist heat, Taping, and Manual therapy   PLAN FOR NEXT SESSION: begin aquatic exercises    Radha Coggins C. Kayce Chismar PT, DPT 08/22/21 3:58 PM

## 2021-08-24 ENCOUNTER — Ambulatory Visit (HOSPITAL_BASED_OUTPATIENT_CLINIC_OR_DEPARTMENT_OTHER): Payer: Self-pay | Admitting: Physical Therapy

## 2021-08-29 ENCOUNTER — Ambulatory Visit (HOSPITAL_BASED_OUTPATIENT_CLINIC_OR_DEPARTMENT_OTHER): Payer: Self-pay | Admitting: Physical Therapy

## 2021-08-30 ENCOUNTER — Other Ambulatory Visit: Payer: Self-pay

## 2021-08-30 ENCOUNTER — Ambulatory Visit (HOSPITAL_BASED_OUTPATIENT_CLINIC_OR_DEPARTMENT_OTHER): Payer: Medicare Other | Admitting: Physical Therapy

## 2021-08-30 ENCOUNTER — Encounter (HOSPITAL_BASED_OUTPATIENT_CLINIC_OR_DEPARTMENT_OTHER): Payer: Self-pay | Admitting: Physical Therapy

## 2021-08-30 DIAGNOSIS — R262 Difficulty in walking, not elsewhere classified: Secondary | ICD-10-CM

## 2021-08-30 DIAGNOSIS — G959 Disease of spinal cord, unspecified: Secondary | ICD-10-CM | POA: Diagnosis not present

## 2021-08-30 DIAGNOSIS — M6281 Muscle weakness (generalized): Secondary | ICD-10-CM

## 2021-08-30 NOTE — Therapy (Signed)
OUTPATIENT PHYSICAL THERAPY TREATMENT NOTE   Patient Name: Teresa Barnes MRN: [id number] DOB:06-08-1946, 75 y.o., female Today's Date: 08/30/2021  PCP: Leamon Arnt, MD REFERRING PROVIDER: Leamon Arnt, MD   PT End of Session - 08/30/21 1619     Visit Number 3    Number of Visits 17    Date for PT Re-Evaluation 10/05/21    Authorization Type [employer] MCR Adv    Progress Note Due on Visit 10    PT Start Time 1615    PT Stop Time 1658    PT Time Calculation (min) 43 min    Activity Tolerance Patient tolerated treatment well    Behavior During Therapy [facility] for tasks assessed/performed             Past Medical History:  Diagnosis Date   Anemia    Anemia of chronic disease 09/24/2018   Anxiety    Blood transfusion without reported diagnosis 07/2016   Cancer ([place]) 1991   breast   Chronic gout 12/09/2017   On allopurinol   Diabetes mellitus without complication ([place])    Esophageal stricture 12/09/2017   S/p dilation by GI: Dr. Scarlette Shorts; due to h/o severe recurrent erosive esophagitis.   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   Injected left foot every 19 2018   GERD (gastroesophageal reflux disease)    Glaucoma    Hiatal hernia 12/09/2017   Hyperlipidemia    Hypertension    Right rotator cuff tear 11/24/2015   Injected this and 26 2017   Wears glasses    Past Surgical History:  Procedure Laterality Date   ANTERIOR CERVICAL DECOMP/DISCECTOMY FUSION N/A 03/26/2019   Procedure: Anterior Cervical Discectomy Fusion - Cervical Three-Cervical Four - Cervical Four-Cervical Five - Cervical Five-Cervical Six;  Surgeon: Eustace Moore, MD;  Location: [place];  Service: Neurosurgery;  Laterality: N/A;  Anterior Cervical Discectomy Fusion - Cervical Three-Cervical Four - Cervical Four-Cervical Five - Cervical Five-Cervical Six   CATARACT EXTRACTION W/ INTRAOCULAR LENS  IMPLANT, BILATERAL  2000   COLONOSCOPY     cyst on spine     ESOPHAGOGASTRODUODENOSCOPY (EGD) WITH PROPOFOL N/A  07/31/2016   Procedure: ESOPHAGOGASTRODUODENOSCOPY (EGD) WITH PROPOFOL;  Surgeon: Manus Gunning, MD;  [facility];  Service: Gastroenterology;  Laterality: N/A;   EYE SURGERY  2011   lt eye blled   MASTECTOMY MODIFIED RADICAL Right 1990   REDUCTION MAMMAPLASTY Left    TRIGGER FINGER RELEASE Right 06/24/2013   Procedure: RELEASE TRIGGER FINGER/A-1 PULLEY PIP RIGHT LONG FINGER;  Surgeon: Cammie Sickle., MD;  Location: [place];  Service: Orthopedics;  Laterality: Right;   TRIGGER FINGER RELEASE     TUBAL LIGATION     Patient Active Problem List   Diagnosis Date Noted   Type 2 diabetes mellitus with stage 4 chronic kidney disease, without long-term current use of insulin ([place]) 08/09/2020   CKD (chronic kidney disease) stage 4, GFR 15-29 ml/min ([place]) 05/31/2020   Acute bursitis of right shoulder 06/03/2019   S/P cervical spinal fusion 03/26/2019   Anemia of chronic disease 09/24/2018   Adhesive bursitis of left shoulder 08/05/2018   History of right breast cancer 12/09/2017   Mixed hyperlipidemia 12/09/2017   Hypertension associated with diabetes ([place]) 12/09/2017   Hiatal hernia 12/09/2017   Esophageal stricture 12/09/2017   Chronic gout 12/09/2017   Glaucoma 12/09/2017   Greater trochanteric bursitis of right hip 11/17/2017   Degenerative arthritis of right knee 07/03/2017   H/O:  upper GI bleed 07/30/2016   Right lumbar radiculopathy 01/23/2016   Arthritis of right acromioclavicular joint 01/02/2016    REFERRING DIAG: R29.898 (ICD-10-CM) - Weakness of lower extremity, unspecified laterality   THERAPY DIAG:  Muscle weakness (generalized)  Difficulty in walking, not elsewhere classified  PERTINENT HISTORY: T2DM, h/o CA, chronic gout, h/o Rt hip/knee pain, Rt lumbar radiculopathy  PRECAUTIONS: fall  SUBJECTIVE: Feel better than last time.   PAIN:  Are you having pain? No     OBJECTIVE:  PATIENT SURVEYS:  LEFS 27   COGNITION:             Overall cognitive status: Within functional limits for tasks assessed                          SENSATION:            WFL     MUSCLE LENGTH: Gross limitations in LE flexibility: HS, quads, hip flexors, DF         LE AROM/PROM:   A/PROM Right 08/10/2021 Left 08/10/2021  Hip flexion      Hip abduction      Hip adduction      Hip extension      Hip internal rotation      Hip external rotation      Knee flexion      Knee extension      Ankle dorsiflexion      Ankle plantarflexion      Ankle inversion      Ankle eversion       (Blank rows = not tested)   LE MMT:   MMT Right 08/10/2021 Left 08/10/2021  Hip flexion 5/5 5/5  Hip abduction      Hip adduction      Hip extension      Hip internal rotation      Hip external rotation      Knee flexion 4/5 4/5  Knee extension 5/5 5/5  Ankle dorsiflexion 5/5 5/5  Ankle plantarflexion      Ankle inversion      Ankle eversion       (Blank rows = not tested)         FUNCTIONAL TESTS:  5 times sit to stand: 24s, legs pressing against chair Berg Balance Scale: 25   GAIT: Distance walked: about 50 feet from check in to clinic Assistive device utilized: Environmental consultant - 4 wheeled Level of assistance: Modified independence Comments: shuffling heard from bil feet  TODAY'S TREATMENT: 10/13  Nu step 5 min L5 UE & LE  Walking with RW- cues for step height & heel strike  LAQ holds for HS stretch  Seated march  Sit<>stand without UE assist  Standing rows red tband, alt rows in wide tandem, bil GHJ ext  Walking without AD   10/5  Stationary bike 5 min L1.7  Gait training- incr step height and heel strike with RW  Standing glut sets  Standing hamstring curls  Standing hip abduction  4" step taps  Standing hip extension in bar plank  Heel raises followed by gastroc stretch  PATIENT EDUCATION:  Education details: exercise form/rationale Person educated: Patient Education method: Consulting civil engineer, Demonstration, Tactile  cues, Verbal cues, and Handouts Education comprehension: verbalized understanding, returned demonstration, verbal cues required, tactile cues required, and needs further education     HOME EXERCISE PROGRAM: [id number]   ASSESSMENT:   CLINICAL IMPRESSION: Verbal cues required to remind pt to clear toes from  floor after toe off in gait. Did better, actually, without RW. She would like to work on walking with her cane tomorrow.    REHAB POTENTIAL: Good   CLINICAL DECISION MAKING: Unstable/unpredictable   EVALUATION COMPLEXITY: High     GOALS: Goals reviewed with patient? Yes   SHORT TERM GOALS:   STG Name Target Date Goal status  1 Pt will be able to navigate stairs in and out of the pool independently with use of hand rails Baseline: expected to require assistance in her first visit for learning & confidence 08/31/21 INITIAL  2 Pt will be able to demo high stepping gait pattern in pool, with noodle support at least one lap across pool without LOB Baseline: will evaluate in pool, consistent shuffling on land for fear of falling 08/31/21   INITIAL                                                 LONG TERM GOALS:    LTG Name Target Date Goal status  1 5TSTS to improve to at least 18s Baseline: 24s at eval 10/05/21 INITIAL  2 BERG to improve by [place] Baseline: 25/56 at eval 10/05/21 INITIAL  3 Pt will feel confident in her ability to walk household distances with [facility] or less Baseline: feels that she needs 4WW at eval 10/05/21 INITIAL                                        PLAN: PT FREQUENCY: 1-2x/week   PT DURATION: 8 weeks   PLANNED INTERVENTIONS: Therapeutic exercises, Therapeutic activity, Neuro Muscular re-education, Balance training, Gait training, Patient/Family education, Joint mobilization, Stair training, Aquatic Therapy, Cryotherapy, Moist heat, Taping, and Manual therapy   PLAN FOR NEXT SESSION: walk with cane    Damari Suastegui C. Yassin Scales PT, DPT 08/30/21 5:04  PM

## 2021-08-31 ENCOUNTER — Ambulatory Visit (HOSPITAL_BASED_OUTPATIENT_CLINIC_OR_DEPARTMENT_OTHER): Payer: Self-pay | Admitting: Physical Therapy

## 2021-08-31 ENCOUNTER — Ambulatory Visit (HOSPITAL_BASED_OUTPATIENT_CLINIC_OR_DEPARTMENT_OTHER): Payer: Medicare Other | Admitting: Physical Therapy

## 2021-09-04 ENCOUNTER — Encounter (HOSPITAL_BASED_OUTPATIENT_CLINIC_OR_DEPARTMENT_OTHER): Payer: Self-pay | Admitting: Physical Therapy

## 2021-09-04 ENCOUNTER — Other Ambulatory Visit: Payer: Self-pay

## 2021-09-04 ENCOUNTER — Ambulatory Visit (HOSPITAL_BASED_OUTPATIENT_CLINIC_OR_DEPARTMENT_OTHER): Payer: Medicare Other | Admitting: Physical Therapy

## 2021-09-04 DIAGNOSIS — M6281 Muscle weakness (generalized): Secondary | ICD-10-CM | POA: Diagnosis not present

## 2021-09-04 DIAGNOSIS — G959 Disease of spinal cord, unspecified: Secondary | ICD-10-CM | POA: Diagnosis not present

## 2021-09-04 DIAGNOSIS — R262 Difficulty in walking, not elsewhere classified: Secondary | ICD-10-CM

## 2021-09-04 NOTE — Therapy (Signed)
OUTPATIENT PHYSICAL THERAPY TREATMENT NOTE   Patient Name: Teresa Barnes MRN: [id number] DOB:[birth year], 75 y.o., female Today's Date: 09/04/2021  PCP: Leamon Arnt, MD REFERRING PROVIDER: Leamon Arnt, MD   PT End of Session - 09/04/21 1622     Visit Number 4    Number of Visits 17    Date for PT Re-Evaluation 10/05/21    Authorization Type [employer] MCR Adv    Progress Note Due on Visit 10    PT Start Time 1619    PT Stop Time 9417    PT Time Calculation (min) 39 min    Activity Tolerance Patient tolerated treatment well    Behavior During Therapy [facility] for tasks assessed/performed             Past Medical History:  Diagnosis Date   Anemia    Anemia of chronic disease 09/24/2018   Anxiety    Blood transfusion without reported diagnosis 07/2016   Cancer ([place]) 1991   breast   Chronic gout 12/09/2017   On allopurinol   Diabetes mellitus without complication ([place])    Esophageal stricture 12/09/2017   S/p dilation by GI: Dr. Scarlette Shorts; due to h/o severe recurrent erosive esophagitis.   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   Injected left foot every 19 2018   GERD (gastroesophageal reflux disease)    Glaucoma    Hiatal hernia 12/09/2017   Hyperlipidemia    Hypertension    Right rotator cuff tear 11/24/2015   Injected this and 26 2017   Wears glasses    Past Surgical History:  Procedure Laterality Date   ANTERIOR CERVICAL DECOMP/DISCECTOMY FUSION N/A 03/26/2019   Procedure: Anterior Cervical Discectomy Fusion - Cervical Three-Cervical Four - Cervical Four-Cervical Five - Cervical Five-Cervical Six;  Surgeon: Eustace Moore, MD;  Location: [place];  Service: Neurosurgery;  Laterality: N/A;  Anterior Cervical Discectomy Fusion - Cervical Three-Cervical Four - Cervical Four-Cervical Five - Cervical Five-Cervical Six   CATARACT EXTRACTION W/ INTRAOCULAR LENS  IMPLANT, BILATERAL  2000   COLONOSCOPY     cyst on spine     ESOPHAGOGASTRODUODENOSCOPY (EGD) WITH PROPOFOL N/A  07/31/2016   Procedure: ESOPHAGOGASTRODUODENOSCOPY (EGD) WITH PROPOFOL;  Surgeon: Manus Gunning, MD;  [facility];  Service: Gastroenterology;  Laterality: N/A;   EYE SURGERY  2011   lt eye blled   MASTECTOMY MODIFIED RADICAL Right 1990   REDUCTION MAMMAPLASTY Left    TRIGGER FINGER RELEASE Right 06/24/2013   Procedure: RELEASE TRIGGER FINGER/A-1 PULLEY PIP RIGHT LONG FINGER;  Surgeon: Cammie Sickle., MD;  Location: [place];  Service: Orthopedics;  Laterality: Right;   TRIGGER FINGER RELEASE     TUBAL LIGATION     Patient Active Problem List   Diagnosis Date Noted   Type 2 diabetes mellitus with stage 4 chronic kidney disease, without long-term current use of insulin ([place]) 08/09/2020   CKD (chronic kidney disease) stage 4, GFR 15-29 ml/min ([place]) 05/31/2020   Acute bursitis of right shoulder 06/03/2019   S/P cervical spinal fusion 03/26/2019   Anemia of chronic disease 09/24/2018   Adhesive bursitis of left shoulder 08/05/2018   History of right breast cancer 12/09/2017   Mixed hyperlipidemia 12/09/2017   Hypertension associated with diabetes ([place]) 12/09/2017   Hiatal hernia 12/09/2017   Esophageal stricture 12/09/2017   Chronic gout 12/09/2017   Glaucoma 12/09/2017   Greater trochanteric bursitis of right hip 11/17/2017   Degenerative arthritis of right knee 07/03/2017   H/O:  upper GI bleed 07/30/2016   Right lumbar radiculopathy 01/23/2016   Arthritis of right acromioclavicular joint 01/02/2016    REFERRING DIAG: R29.898 (ICD-10-CM) - Weakness of lower extremity, unspecified laterality   THERAPY DIAG:  Muscle weakness (generalized)  Difficulty in walking, not elsewhere classified  Cervical myelopathy ([place])  PERTINENT HISTORY: T2DM, h/o CA, chronic gout, h/o Rt hip/knee pain, Rt lumbar radiculopathy  PRECAUTIONS: fall  SUBJECTIVE: I am cold today. My Right leg was so sore after last visit so I didn't come to the Friday appointment.  I am feeling okay today.   PAIN:  Are you having pain? No     OBJECTIVE:  PATIENT SURVEYS:  LEFS 27   COGNITION:            Overall cognitive status: Within functional limits for tasks assessed                          SENSATION:            WFL     MUSCLE LENGTH: Gross limitations in LE flexibility: HS, quads, hip flexors, DF         LE AROM/PROM:   A/PROM Right 08/10/2021 Left 08/10/2021  Hip flexion      Hip abduction      Hip adduction      Hip extension      Hip internal rotation      Hip external rotation      Knee flexion      Knee extension      Ankle dorsiflexion      Ankle plantarflexion      Ankle inversion      Ankle eversion       (Blank rows = not tested)   LE MMT:   MMT Right 08/10/2021 Left 08/10/2021  Hip flexion 5/5 5/5  Hip abduction      Hip adduction      Hip extension      Hip internal rotation      Hip external rotation      Knee flexion 4/5 4/5  Knee extension 5/5 5/5  Ankle dorsiflexion 5/5 5/5  Ankle plantarflexion      Ankle inversion      Ankle eversion       (Blank rows = not tested)         FUNCTIONAL TESTS:  5 times sit to stand: 24s, legs pressing against chair Berg Balance Scale: 25   GAIT: Distance walked: about 50 feet from check in to clinic Assistive device utilized: Environmental consultant - 4 wheeled Level of assistance: Modified independence Comments: shuffling heard from bil feet  TODAY'S TREATMENT: 10/18  Nu step 5 min L4 Ue & Le  Gait- walking in building with SPC- 1 lap clinic<>cafe, 1 lap clinic<>security desk, 1 lap clinic <>just outside of double doors  Sit<>stand 3x5   10/13  Nu step 5 min L5 UE & LE  Walking with RW- cues for step height & heel strike  LAQ holds for HS stretch  Seated march  Sit<>stand without UE assist  Standing rows red tband, alt rows in wide tandem, bil GHJ ext  Walking without AD   10/5  Stationary bike 5 min L1.7  Gait training- incr step height and heel strike with  RW  Standing glut sets  Standing hamstring curls  Standing hip abduction  4" step taps  Standing hip extension in bar plank  Heel raises followed by gastroc stretch  PATIENT EDUCATION:  Education details: exercise form/rationale Person educated: Patient Education method: Explanation, Demonstration, Tactile cues, Verbal cues, and Handouts Education comprehension: verbalized understanding, returned demonstration, verbal cues required, tactile cues required, and needs further education     HOME EXERCISE PROGRAM: [id number]   ASSESSMENT:   CLINICAL IMPRESSION: Short step length and height when walking with cane as well as lack of arm swing from Lt UE. She was a little nervous and fatigued with walking but did very well. No longer using rocking for momentum in sit to stand and does not press her legs against the chair.   REHAB POTENTIAL: Good   CLINICAL DECISION MAKING: Unstable/unpredictable   EVALUATION COMPLEXITY: High     GOALS: Goals reviewed with patient? Yes   SHORT TERM GOALS:   STG Name Target Date Goal status  1 Pt will be able to navigate stairs in and out of the pool independently with use of hand rails Baseline: expected to require assistance in her first visit for learning & confidence 08/31/21 Deferred-does not want to do pool  2 Pt will be able to demo high stepping gait pattern in pool, with noodle support at least one lap across pool without LOB Baseline: will evaluate in pool, consistent shuffling on land for fear of falling 08/31/21   Deferred- does not want to do pool                                                 LONG TERM GOALS:    LTG Name Target Date Goal status  1 5TSTS to improve to at least 18s Baseline: 24s at eval 10/05/21 INITIAL  2 BERG to improve by [place] Baseline: 25/56 at eval 10/05/21 INITIAL  3 Pt will feel confident in her ability to walk household distances with [facility] or less Baseline: feels that she needs 4WW at eval 10/05/21  INITIAL                                        PLAN: PT FREQUENCY: 1-2x/week   PT DURATION: 8 weeks   PLANNED INTERVENTIONS: Therapeutic exercises, Therapeutic activity, Neuro Muscular re-education, Balance training, Gait training, Patient/Family education, Joint mobilization, Stair training, Aquatic Therapy, Cryotherapy, Moist heat, Taping, and Manual therapy   PLAN FOR NEXT SESSION: continue gait with cane, steps    Ananth Fiallos C. Merle Whitehorn PT, DPT 09/04/21 5:07 PM

## 2021-09-06 ENCOUNTER — Encounter (HOSPITAL_BASED_OUTPATIENT_CLINIC_OR_DEPARTMENT_OTHER): Payer: Self-pay | Admitting: Physical Therapy

## 2021-09-06 ENCOUNTER — Other Ambulatory Visit: Payer: Self-pay

## 2021-09-06 ENCOUNTER — Ambulatory Visit (HOSPITAL_BASED_OUTPATIENT_CLINIC_OR_DEPARTMENT_OTHER): Payer: Medicare Other | Admitting: Physical Therapy

## 2021-09-06 DIAGNOSIS — R262 Difficulty in walking, not elsewhere classified: Secondary | ICD-10-CM | POA: Diagnosis not present

## 2021-09-06 DIAGNOSIS — G959 Disease of spinal cord, unspecified: Secondary | ICD-10-CM | POA: Diagnosis not present

## 2021-09-06 DIAGNOSIS — M6281 Muscle weakness (generalized): Secondary | ICD-10-CM | POA: Diagnosis not present

## 2021-09-06 NOTE — Therapy (Signed)
OUTPATIENT PHYSICAL THERAPY TREATMENT NOTE   Patient Name: Teresa Barnes MRN: [id number] DOB:07/12/1946, 75 y.o., female Today's Date: 09/06/2021  PCP: Leamon Arnt, MD REFERRING PROVIDER: Leamon Arnt, MD   PT End of Session - 09/06/21 1626     Visit Number 5    Number of Visits 17    Date for PT Re-Evaluation 10/05/21    Authorization Type [employer] MCR Adv    Progress Note Due on Visit 10    PT Start Time 1620    PT Stop Time 1655    PT Time Calculation (min) 35 min    Activity Tolerance Patient tolerated treatment well    Behavior During Therapy [facility] for tasks assessed/performed              Past Medical History:  Diagnosis Date   Anemia    Anemia of chronic disease 09/24/2018   Anxiety    Blood transfusion without reported diagnosis 07/2016   Cancer ([place]) 1991   breast   Chronic gout 12/09/2017   On allopurinol   Diabetes mellitus without complication ([place])    Esophageal stricture 12/09/2017   S/p dilation by GI: Dr. Scarlette Shorts; due to h/o severe recurrent erosive esophagitis.   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   Injected left foot every 19 2018   GERD (gastroesophageal reflux disease)    Glaucoma    Hiatal hernia 12/09/2017   Hyperlipidemia    Hypertension    Right rotator cuff tear 11/24/2015   Injected this and 26 2017   Wears glasses    Past Surgical History:  Procedure Laterality Date   ANTERIOR CERVICAL DECOMP/DISCECTOMY FUSION N/A 03/26/2019   Procedure: Anterior Cervical Discectomy Fusion - Cervical Three-Cervical Four - Cervical Four-Cervical Five - Cervical Five-Cervical Six;  Surgeon: Eustace Moore, MD;  Location: [facility];  Service: Neurosurgery;  Laterality: N/A;  Anterior Cervical Discectomy Fusion - Cervical Three-Cervical Four - Cervical Four-Cervical Five - Cervical Five-Cervical Six   CATARACT EXTRACTION W/ INTRAOCULAR LENS  IMPLANT, BILATERAL  2000   COLONOSCOPY     cyst on spine     ESOPHAGOGASTRODUODENOSCOPY (EGD) WITH PROPOFOL N/A  07/31/2016   Procedure: ESOPHAGOGASTRODUODENOSCOPY (EGD) WITH PROPOFOL;  Surgeon: Manus Gunning, MD;  Location: [facility];  Service: Gastroenterology;  Laterality: N/A;   EYE SURGERY  2011   lt eye blled   MASTECTOMY MODIFIED RADICAL Right 1990   REDUCTION MAMMAPLASTY Left    TRIGGER FINGER RELEASE Right 06/24/2013   Procedure: RELEASE TRIGGER FINGER/A-1 PULLEY PIP RIGHT LONG FINGER;  Surgeon: Cammie Sickle., MD;  Location: [facility];  Service: Orthopedics;  Laterality: Right;   TRIGGER FINGER RELEASE     TUBAL LIGATION     Patient Active Problem List   Diagnosis Date Noted   Type 2 diabetes mellitus with stage 4 chronic kidney disease, without long-term current use of insulin ([place]) 08/09/2020   CKD (chronic kidney disease) stage 4, GFR 15-29 ml/min ([place]) 05/31/2020   Acute bursitis of right shoulder 06/03/2019   S/P cervical spinal fusion 03/26/2019   Anemia of chronic disease 09/24/2018   Adhesive bursitis of left shoulder 08/05/2018   History of right breast cancer 12/09/2017   Mixed hyperlipidemia 12/09/2017   Hypertension associated with diabetes ([place]) 12/09/2017   Hiatal hernia 12/09/2017   Esophageal stricture 12/09/2017   Chronic gout 12/09/2017   Glaucoma 12/09/2017   Greater trochanteric bursitis of right hip 11/17/2017   Degenerative arthritis of right knee 07/03/2017  H/O: upper GI bleed 07/30/2016   Right lumbar radiculopathy 01/23/2016   Arthritis of right acromioclavicular joint 01/02/2016    REFERRING DIAG: R29.898 (ICD-10-CM) - Weakness of lower extremity, unspecified laterality   THERAPY DIAG:  Muscle weakness (generalized)  Difficulty in walking, not elsewhere classified  Cervical myelopathy ([place])  PERTINENT HISTORY: T2DM, h/o CA, chronic gout, h/o Rt hip/knee pain, Rt lumbar radiculopathy  PRECAUTIONS: fall  SUBJECTIVE: not too sore after last time. My legs are stiff with the cold weather.   PAIN:  Are you having  pain? No     OBJECTIVE:  PATIENT SURVEYS:  LEFS 27   COGNITION:            Overall cognitive status: Within functional limits for tasks assessed                          SENSATION:            WFL     MUSCLE LENGTH: Gross limitations in LE flexibility: HS, quads, hip flexors, DF         LE AROM/PROM:   A/PROM Right 08/10/2021 Left 08/10/2021  Hip flexion      Hip abduction      Hip adduction      Hip extension      Hip internal rotation      Hip external rotation      Knee flexion      Knee extension      Ankle dorsiflexion      Ankle plantarflexion      Ankle inversion      Ankle eversion       (Blank rows = not tested)   LE MMT:   MMT Right 08/10/2021 Left 08/10/2021  Hip flexion 5/5 5/5  Hip abduction      Hip adduction      Hip extension      Hip internal rotation      Hip external rotation      Knee flexion 4/5 4/5  Knee extension 5/5 5/5  Ankle dorsiflexion 5/5 5/5  Ankle plantarflexion      Ankle inversion      Ankle eversion       (Blank rows = not tested)         FUNCTIONAL TESTS:  5 times sit to stand: 24s, legs pressing against chair Berg Balance Scale: 25   GAIT: Distance walked: about 50 feet from check in to clinic Assistive device utilized: Environmental consultant - 4 wheeled Level of assistance: Modified independence Comments: shuffling heard from bil feet  TODAY'S TREATMENT: 10/20  Nu step 5 min L7 Ue & LE  Gait: with cane 3 trials: clinic<> teaching kitchen, clinic<>IT cafe, clinic<>double doors  10/18  Nu step 5 min L4 Ue & Le  Gait- walking in building with SPC- 1 lap clinic<>cafe, 1 lap clinic<>security desk, 1 lap clinic <>just outside of double doors  Sit<>stand 3x5   10/13  Nu step 5 min L5 UE & LE  Walking with RW- cues for step height & heel strike  LAQ holds for HS stretch  Seated march  Sit<>stand without UE assist  Standing rows red tband, alt rows in wide tandem, bil GHJ ext  Walking without AD   10/5  Stationary  bike 5 min L1.7  Gait training- incr step height and heel strike with RW  Standing glut sets  Standing hamstring curls  Standing hip abduction  4" step taps  Standing hip extension in bar plank  Heel raises followed by gastroc stretch  PATIENT EDUCATION:  Education details: exercise form/rationale Person educated: Patient Education method: Explanation, Demonstration, Tactile cues, Verbal cues, and Handouts Education comprehension: verbalized understanding, returned demonstration, verbal cues required, tactile cues required, and needs further education     HOME EXERCISE PROGRAM: [id number]   ASSESSMENT:   CLINICAL IMPRESSION: Decrease in cadence noted today but was able to walk a longer distance than she was last time. A few moments of rest in between sets but reported feeling really good. 2 incidence of LOB to the Rt side that she corrected independently. Good foot clearance throughout her walking.   REHAB POTENTIAL: Good   CLINICAL DECISION MAKING: Unstable/unpredictable   EVALUATION COMPLEXITY: High     GOALS: Goals reviewed with patient? Yes   SHORT TERM GOALS:   STG Name Target Date Goal status  1 Pt will be able to navigate stairs in and out of the pool independently with use of hand rails Baseline: expected to require assistance in her first visit for learning & confidence 08/31/21 Deferred-does not want to do pool  2 Pt will be able to demo high stepping gait pattern in pool, with noodle support at least one lap across pool without LOB Baseline: will evaluate in pool, consistent shuffling on land for fear of falling 08/31/21   Deferred- does not want to do pool                                                 LONG TERM GOALS:    LTG Name Target Date Goal status  1 5TSTS to improve to at least 18s Baseline: 24s at eval 10/05/21 INITIAL  2 BERG to improve by [place] Baseline: 25/56 at eval 10/05/21 INITIAL  3 Pt will feel confident in her ability to walk household  distances with [facility] or less Baseline: feels that she needs 4WW at eval 10/05/21 INITIAL                                        PLAN: PT FREQUENCY: 1-2x/week   PT DURATION: 8 weeks   PLANNED INTERVENTIONS: Therapeutic exercises, Therapeutic activity, Neuro Muscular re-education, Balance training, Gait training, Patient/Family education, Joint mobilization, Stair training, Aquatic Therapy, Cryotherapy, Moist heat, Taping, and Manual therapy   PLAN FOR NEXT SESSION: continue gait with cane, steps    Shanta Hartner C. Itzamar Traynor PT, DPT 09/06/21 5:02 PM

## 2021-09-10 ENCOUNTER — Encounter (HOSPITAL_BASED_OUTPATIENT_CLINIC_OR_DEPARTMENT_OTHER): Payer: Self-pay | Admitting: Physical Therapy

## 2021-09-10 DIAGNOSIS — N184 Chronic kidney disease, stage 4 (severe): Secondary | ICD-10-CM | POA: Diagnosis not present

## 2021-09-17 ENCOUNTER — Ambulatory Visit: Payer: Medicare Other | Admitting: Podiatry

## 2021-09-17 ENCOUNTER — Other Ambulatory Visit: Payer: Self-pay

## 2021-09-17 ENCOUNTER — Encounter: Payer: Self-pay | Admitting: Podiatry

## 2021-09-17 DIAGNOSIS — M79675 Pain in left toe(s): Secondary | ICD-10-CM

## 2021-09-17 DIAGNOSIS — E119 Type 2 diabetes mellitus without complications: Secondary | ICD-10-CM | POA: Diagnosis not present

## 2021-09-17 DIAGNOSIS — B351 Tinea unguium: Secondary | ICD-10-CM | POA: Diagnosis not present

## 2021-09-17 DIAGNOSIS — M79674 Pain in right toe(s): Secondary | ICD-10-CM | POA: Diagnosis not present

## 2021-09-19 ENCOUNTER — Telehealth: Payer: Self-pay

## 2021-09-19 NOTE — Telephone Encounter (Signed)
Noticed a rash and had some itching on her back, about a week 1/2 after getting her Moderna vaccine. Received vaccine on 08/16/2021 Was taking farxiga for 30 days or so, Dr.Foster her kidney doctor directed her to stop taking the [place]. She has not taken any since last Friday. Was instructued to follow up with PCP as far as what to do being off of the diabetes medication. Unsure if rash is coming from vaccine or the medication, rash has started to dry up

## 2021-09-19 NOTE — Telephone Encounter (Signed)
Error

## 2021-09-19 NOTE — Telephone Encounter (Signed)
Patient called in wanting to speak with Dr.Andy about a rash, advised that it would be best to schedule an appointment but patient declined stating she was advised to talk with Dr.Andy about this rash and her diabetes.

## 2021-09-19 NOTE — Telephone Encounter (Signed)
Please get patient scheduled.  °

## 2021-09-20 ENCOUNTER — Other Ambulatory Visit: Payer: Self-pay | Admitting: Family Medicine

## 2021-09-20 NOTE — Progress Notes (Signed)
Subjective: Teresa Barnes is a 75 y.o. female patient seen today for follow up of  painful thick toenails that are difficult to trim. Pain interferes with ambulation. Aggravating factors include wearing enclosed shoe gear. Pain is relieved with periodic professional debridement.  New problems reported today: None.  Patient did not check blood glucose on today.  PCP is Leamon Arnt, MD. Last visit was: 08/06/2021.Marland Kitchen  Allergies  Allergen Reactions   Prednisone Nausea And Vomiting   Tramadol Nausea Only    Objective: Physical Exam  General: Patient is a pleasant 75 y.o. African American female WD, WN in NAD. [name] x 3.   Neurovascular Examination: CFT <3 seconds b/l LE. Faintly palpable pedal pulses b/l LE. Pedal hair absent b/l LE. Skin temperature gradient WNL b/l. No pain with calf compression b/l. No edema b/l LE. Lower extremity skin temperature gradient within normal limits.  Protective sensation intact 5/5 intact bilaterally with 10g monofilament b/l. Vibratory sensation intact b/l.  Dermatological:  Pedal integument with normal turgor, texture and tone b/l LE. No open wounds b/l. No interdigital macerations b/l. Toenails 1-5 b/l elongated, thickened, discolored with subungual debris. +Tenderness with dorsal palpation of nailplates. No hyperkeratotic or porokeratotic lesions present. Toenails 1-5 b/l elongated, discolored, dystrophic, thickened, crumbly with subungual debris and tenderness to dorsal palpation. Incurvated nailplate lateral border(s) L hallux.  Nail border hypertrophy minimal. There is tenderness to palpation. Sign(s) of infection: no clinical signs of infection noted on examination today..  Musculoskeletal:  Normal muscle strength 5/5 to all lower extremity muscle groups bilaterally. No pain crepitus or joint limitation noted with ROM b/l lower extremities. No gross bony deformities b/l lower extremities.  Assessment: 1. Pain due to onychomycosis of toenails of both  feet   2. Diabetes mellitus without complication ([place])    Plan: Patient was evaluated and treated and all questions answered. Consent given for treatment as described below: -Continue diabetic foot care principles: inspect feet daily, monitor glucose as recommended by PCP and/or Endocrinologist, and follow prescribed diet per PCP, Endocrinologist and/or dietician. -Toenails 2-5 bilaterally and R hallux debrided in length and girth without iatrogenic bleeding with sterile nail nipper and dremel.  -Offending nail border debrided and curretaged L hallux utilizing sterile nail nipper and currette. Border cleansed with alcohol and triple antibiotic applied. No further treatment required by patient/caregiver. -Patient/POA to call should there be question/concern in the interim.  Return in about 3 months (around 12/18/2021).  Marzetta Board, DPM

## 2021-09-20 NOTE — Telephone Encounter (Signed)
Patient is scheduled for tomorrow.

## 2021-09-21 ENCOUNTER — Ambulatory Visit (HOSPITAL_BASED_OUTPATIENT_CLINIC_OR_DEPARTMENT_OTHER): Payer: Medicare Other | Attending: Family Medicine | Admitting: Physical Therapy

## 2021-09-21 ENCOUNTER — Other Ambulatory Visit: Payer: Self-pay

## 2021-09-21 ENCOUNTER — Encounter: Payer: Self-pay | Admitting: Family Medicine

## 2021-09-21 ENCOUNTER — Encounter (HOSPITAL_BASED_OUTPATIENT_CLINIC_OR_DEPARTMENT_OTHER): Payer: Self-pay | Admitting: Physical Therapy

## 2021-09-21 ENCOUNTER — Ambulatory Visit (INDEPENDENT_AMBULATORY_CARE_PROVIDER_SITE_OTHER): Payer: Medicare Other | Admitting: Family Medicine

## 2021-09-21 VITALS — BP 142/60 | HR 107 | Temp 97.7°F | Wt 175.0 lb

## 2021-09-21 DIAGNOSIS — M6281 Muscle weakness (generalized): Secondary | ICD-10-CM | POA: Insufficient documentation

## 2021-09-21 DIAGNOSIS — R262 Difficulty in walking, not elsewhere classified: Secondary | ICD-10-CM | POA: Insufficient documentation

## 2021-09-21 DIAGNOSIS — R21 Rash and other nonspecific skin eruption: Secondary | ICD-10-CM | POA: Diagnosis not present

## 2021-09-21 NOTE — Progress Notes (Signed)
Subjective  CC:  Chief Complaint  Patient presents with   Rash    Present for about a month, realized after covid shot    HPI: Teresa Barnes is a 75 y.o. female who presents to the office today to address the problems listed above in the chief complaint. Patient reports that she had her COVID booster at the end of September.  She then reports that she noted some itching on her left upper shoulder.  She reports that there was a red rash there.  She discussed this with her kidney doctor, patient thought this could be due to [place] which she had started a month prior.  She has held her [place] for 1 week.  She is here for follow-up.  She no longer has a rash.  It is no longer itching.  The rash was never diffuse.  No pain or blistering.  No associated systemic symptoms.  She feels fine  Assessment  1. Rash      Plan  Rash: Possible side effect from injection but unclear.  Doubt it is related to [place] given its a localized.  Will restart Farxiga and monitor.  She has a follow-up appointment in about 3 weeks. Elevated blood pressure: Restart Farxiga and recheck in 3 weeks.  Has been well controlled  Follow up: As scheduled 10/16/2021  No orders of the defined types were placed in this encounter.  No orders of the defined types were placed in this encounter.     I reviewed the patients updated PMH, FH, and SocHx.    Patient Active Problem List   Diagnosis Date Noted   Type 2 diabetes mellitus with stage 4 chronic kidney disease, without long-term current use of insulin ([place]) 08/09/2020    Priority: High   CKD (chronic kidney disease) stage 4, GFR 15-29 ml/min ([place]) 05/31/2020    Priority: High   Mixed hyperlipidemia 12/09/2017    Priority: High   Hypertension associated with diabetes ([place]) 12/09/2017    Priority: High   H/O: upper GI bleed 07/30/2016    Priority: High   S/P cervical spinal fusion 03/26/2019    Priority: Medium    Anemia of chronic disease 09/24/2018     Priority: Medium    History of right breast cancer 12/09/2017    Priority: Medium    Hiatal hernia 12/09/2017    Priority: Medium    Esophageal stricture 12/09/2017    Priority: Medium    Chronic gout 12/09/2017    Priority: Medium    Glaucoma 12/09/2017    Priority: Medium    Degenerative arthritis of right knee 07/03/2017    Priority: Medium    Right lumbar radiculopathy 01/23/2016    Priority: Medium    Arthritis of right acromioclavicular joint 01/02/2016    Priority: Medium    Greater trochanteric bursitis of right hip 11/17/2017    Priority: Low   Acute bursitis of right shoulder 06/03/2019   Adhesive bursitis of left shoulder 08/05/2018   Current Meds  Medication Sig   Cholecalciferol (VITAMIN D3 PO) Take 1,000 mg by mouth daily.   COVID-19 mRNA bivalent vaccine, Moderna, (MODERNA COVID-19 BIVAL BOOSTER) 50 MCG/0.5ML injection Inject into the muscle.   dapagliflozin propanediol (FARXIGA) 5 MG TABS tablet Take by mouth daily.   dorzolamide-timolol (COSOPT) 22.3-6.8 MG/ML ophthalmic solution Place 1 drop into both eyes 2 (two) times daily.   ferrous sulfate 325 (65 FE) MG tablet Take 325 mg by mouth daily with breakfast.   latanoprost (XALATAN)  0.005 % ophthalmic solution Place 1 drop into both eyes at bedtime.   losartan (COZAAR) 50 MG tablet Take 50 mg by mouth daily.   omeprazole (PRILOSEC) 20 MG capsule Take 20 mg by mouth daily.   rosuvastatin (CRESTOR) 20 MG tablet Take 1 tablet by mouth once daily   vitamin B-12 (CYANOCOBALAMIN) 500 MCG tablet Take 500 mcg by mouth every other day.    Allergies: Patient is allergic to prednisone and tramadol. Family History: Patient family history includes Arthritis in her daughter and son; Diabetes in her daughter, mother, sister, and son; Hyperlipidemia in her daughter. Social History:  Patient  reports that she quit smoking about 28 years ago. Her smoking use included cigarettes. She has a 2.50 pack-year smoking history. She  has never used smokeless tobacco. She reports that she does not drink alcohol and does not use drugs.  Review of Systems: Constitutional: Negative for fever malaise or anorexia Cardiovascular: negative for chest pain Respiratory: negative for SOB or persistent cough Gastrointestinal: negative for abdominal pain  Objective  Vitals: BP (!) 142/60   Pulse (!) 107   Temp 97.7 F (36.5 C) (Temporal)   Wt 175 lb (79.4 kg)   SpO2 95%   BMI 30.04 kg/m  General: no acute distress , A&Ox3  Skin:  Warm, over left trap there is a very small dry area of skin, slightly hyperpigmented.  No other rashes noted    Commons side effects, risks, benefits, and alternatives for medications and treatment plan prescribed today were discussed, and the patient expressed understanding of the given instructions. Patient is instructed to call or message via MyChart if he/she has any questions or concerns regarding our treatment plan. No barriers to understanding were identified. We discussed Red Flag symptoms and signs in detail. Patient expressed understanding regarding what to do in case of urgent or emergency type symptoms.  Medication list was reconciled, printed and provided to the patient in AVS. Patient instructions and summary information was reviewed with the patient as documented in the AVS. This note was prepared with assistance of Dragon voice recognition software. Occasional wrong-word or sound-a-like substitutions may have occurred due to the inherent limitations of voice recognition software  This visit occurred during the SARS-CoV-2 public health emergency.  Safety protocols were in place, including screening questions prior to the visit, additional usage of staff PPE, and extensive cleaning of exam room while observing appropriate contact time as indicated for disinfecting solutions.

## 2021-09-21 NOTE — Therapy (Addendum)
OUTPATIENT PHYSICAL THERAPY TREATMENT NOTE/Discharge   Patient Name: Teresa Barnes MRN: [id number] DOB:[birth year], 75 y.o., female Today's Date: 09/21/2021  PCP: Leamon Arnt, MD REFERRING PROVIDER: Leamon Arnt, MD   PT End of Session - 09/21/21 1442     Visit Number 6    Number of Visits 17    Date for PT Re-Evaluation 10/05/21    Authorization Type [employer] MCR Adv    Progress Note Due on Visit 10    PT Start Time 7494    PT Stop Time 1514    PT Time Calculation (min) 35 min    Activity Tolerance Patient tolerated treatment well    Behavior During Therapy [facility] for tasks assessed/performed               Past Medical History:  Diagnosis Date   Anemia    Anemia of chronic disease 09/24/2018   Anxiety    Blood transfusion without reported diagnosis 07/2016   Cancer ([place]) 1991   breast   Chronic gout 12/09/2017   On allopurinol   Diabetes mellitus without complication ([place])    Esophageal stricture 12/09/2017   S/p dilation by GI: Dr. Scarlette Shorts; due to h/o severe recurrent erosive esophagitis.   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   Injected left foot every 19 2018   GERD (gastroesophageal reflux disease)    Glaucoma    Hiatal hernia 12/09/2017   Hyperlipidemia    Hypertension    Right rotator cuff tear 11/24/2015   Injected this and 26 2017   Wears glasses    Past Surgical History:  Procedure Laterality Date   ANTERIOR CERVICAL DECOMP/DISCECTOMY FUSION N/A 03/26/2019   Procedure: Anterior Cervical Discectomy Fusion - Cervical Three-Cervical Four - Cervical Four-Cervical Five - Cervical Five-Cervical Six;  Surgeon: Eustace Moore, MD;  Location: [place];  Service: Neurosurgery;  Laterality: N/A;  Anterior Cervical Discectomy Fusion - Cervical Three-Cervical Four - Cervical Four-Cervical Five - Cervical Five-Cervical Six   CATARACT EXTRACTION W/ INTRAOCULAR LENS  IMPLANT, BILATERAL  2000   COLONOSCOPY     cyst on spine     ESOPHAGOGASTRODUODENOSCOPY (EGD) WITH  PROPOFOL N/A 07/31/2016   Procedure: ESOPHAGOGASTRODUODENOSCOPY (EGD) WITH PROPOFOL;  Surgeon: Manus Gunning, MD;  [facility];  Service: Gastroenterology;  Laterality: N/A;   EYE SURGERY  2011   lt eye blled   MASTECTOMY MODIFIED RADICAL Right 1990   REDUCTION MAMMAPLASTY Left    TRIGGER FINGER RELEASE Right 06/24/2013   Procedure: RELEASE TRIGGER FINGER/A-1 PULLEY PIP RIGHT LONG FINGER;  Surgeon: Cammie Sickle., MD;  Location: [place];  Service: Orthopedics;  Laterality: Right;   TRIGGER FINGER RELEASE     TUBAL LIGATION     Patient Active Problem List   Diagnosis Date Noted   Type 2 diabetes mellitus with stage 4 chronic kidney disease, without long-term current use of insulin ([place]) 08/09/2020   CKD (chronic kidney disease) stage 4, GFR 15-29 ml/min ([place]) 05/31/2020   Acute bursitis of right shoulder 06/03/2019   S/P cervical spinal fusion 03/26/2019   Anemia of chronic disease 09/24/2018   Adhesive bursitis of left shoulder 08/05/2018   History of right breast cancer 12/09/2017   Mixed hyperlipidemia 12/09/2017   Hypertension associated with diabetes ([place]) 12/09/2017   Hiatal hernia 12/09/2017   Esophageal stricture 12/09/2017   Chronic gout 12/09/2017   Glaucoma 12/09/2017   Greater trochanteric bursitis of right hip 11/17/2017   Degenerative arthritis of right knee 07/03/2017  H/O: upper GI bleed 07/30/2016   Right lumbar radiculopathy 01/23/2016   Arthritis of right acromioclavicular joint 01/02/2016    REFERRING DIAG: R29.898 (ICD-10-CM) - Weakness of lower extremity, unspecified laterality   THERAPY DIAG:  Muscle weakness (generalized)  Difficulty in walking, not elsewhere classified  PERTINENT HISTORY: T2DM, h/o CA, chronic gout, h/o Rt hip/knee pain, Rt lumbar radiculopathy  PRECAUTIONS: fall  SUBJECTIVE: I could not even concentrate on my legs because the rash on my back itched so bad.   PAIN:  Are you having pain?  No     OBJECTIVE:  PATIENT SURVEYS:  LEFS 27   COGNITION:            Overall cognitive status: Within functional limits for tasks assessed                          SENSATION:            WFL     MUSCLE LENGTH: Gross limitations in LE flexibility: HS, quads, hip flexors, DF         LE AROM/PROM:   A/PROM Right 08/10/2021 Left 08/10/2021  Hip flexion      Hip abduction      Hip adduction      Hip extension      Hip internal rotation      Hip external rotation      Knee flexion      Knee extension      Ankle dorsiflexion      Ankle plantarflexion      Ankle inversion      Ankle eversion       (Blank rows = not tested)   LE MMT:   MMT Right 08/10/2021 Left 08/10/2021  Hip flexion 5/5 5/5  Hip abduction      Hip adduction      Hip extension      Hip internal rotation      Hip external rotation      Knee flexion 4/5 4/5  Knee extension 5/5 5/5  Ankle dorsiflexion 5/5 5/5  Ankle plantarflexion      Ankle inversion      Ankle eversion       (Blank rows = not tested)         FUNCTIONAL TESTS:  5 times sit to stand: 24s, legs pressing against chair  11/4: 20s Berg Balance Scale: 25   GAIT: Distance walked: about 50 feet from check in to clinic Assistive device utilized: Environmental consultant - 4 wheeled Level of assistance: Modified independence Comments: shuffling heard from bil feet  TODAY'S TREATMENT: 11/4 Nu step 7 min L4 UE & LE Sit<>stand with UE assist with and without airex under feet Attempted sit<>stand without UE assist Manual therapy: STM to Rt piriformis Gait with SPC   10/20  Nu step 5 min L7 Ue & LE  Gait: with cane 3 trials: clinic<> teaching kitchen, clinic<>IT cafe, clinic<>double doors  10/18  Nu step 5 min L4 Ue & Le  Gait- walking in building with SPC- 1 lap clinic<>cafe, 1 lap clinic<>security desk, 1 lap clinic <>just outside of double doors  Sit<>stand 3x5   10/13  Nu step 5 min L5 UE & LE  Walking with RW- cues for step height &  heel strike  LAQ holds for HS stretch  Seated march  Sit<>stand without UE assist  Standing rows red tband, alt rows in wide tandem, bil GHJ ext  Walking without AD  10/5  Stationary bike 5 min L1.7  Gait training- incr step height and heel strike with RW  Standing glut sets  Standing hamstring curls  Standing hip abduction  4" step taps  Standing hip extension in bar plank  Heel raises followed by gastroc stretch  PATIENT EDUCATION:  Education details: exercise form/rationale Person educated: Patient Education method: Explanation, Demonstration, Tactile cues, Verbal cues, and Handouts Education comprehension: verbalized understanding, returned demonstration, verbal cues required, tactile cues required, and needs further education     HOME EXERCISE PROGRAM: [id number]   ASSESSMENT:   CLINICAL IMPRESSION: Demo ambulation without shuffling of Lt foot with very minimal shuffling of Rt. Still not clearing opp foot in step length but does not present LOB. PT cont to provide CGA for safety. Required cuing and encouragement as she was too nervous to stand from the chair wihtout using her hands with feet on airex pad.    REHAB POTENTIAL: Good   CLINICAL DECISION MAKING: Unstable/unpredictable   EVALUATION COMPLEXITY: High     GOALS: Goals reviewed with patient? Yes   SHORT TERM GOALS:   STG Name Target Date Goal status  1 Pt will be able to navigate stairs in and out of the pool independently with use of hand rails Baseline: expected to require assistance in her first visit for learning & confidence 08/31/21 Deferred-does not want to do pool  2 Pt will be able to demo high stepping gait pattern in pool, with noodle support at least one lap across pool without LOB Baseline: will evaluate in pool, consistent shuffling on land for fear of falling 08/31/21   Deferred- does not want to do pool                                                 LONG TERM GOALS:    LTG Name  Target Date Goal status  1 5TSTS to improve to at least 18s Baseline: 24s at eval 10/05/21 INITIAL  2 BERG to improve by [place] Baseline: 25/56 at eval 10/05/21 INITIAL  3 Pt will feel confident in her ability to walk household distances with [facility] or less Baseline: feels that she needs 4WW at eval 10/05/21 INITIAL                                        PLAN: PT FREQUENCY: 1-2x/week   PT DURATION: 8 weeks   PLANNED INTERVENTIONS: Therapeutic exercises, Therapeutic activity, Neuro Muscular re-education, Balance training, Gait training, Patient/Family education, Joint mobilization, Stair training, Aquatic Therapy, Cryotherapy, Moist heat, Taping, and Manual therapy   PLAN FOR NEXT SESSION: continue gait with cane, steps    Undra Trembath C. Nancylee Gaines PT, DPT 09/21/21 7:41 PM    PHYSICAL THERAPY DISCHARGE SUMMARY  Visits from Start of Care: 6  Current functional level related to goals / functional outcomes: See above   Remaining deficits: See above   Education / Equipment: Anatomy of condition, POC, HEP, exercise form/rationale    Patient agrees to discharge. Patient goals were not met. Patient is being discharged due to not returning since the last visit.  Wenceslaus Gist C. Raziel Koenigs PT, DPT 03/26/22 8:58 AM

## 2021-09-21 NOTE — Patient Instructions (Signed)
Please follow up as scheduled for your next visit with me: 10/16/2021   Please restart the farxiga. Your skin is clear right now.   If you have any questions or concerns, please don't hesitate to send me a message via MyChart or call the office at [phone]. Thank you for visiting with [place] today! It's our pleasure caring for you.

## 2021-09-23 NOTE — Therapy (Incomplete)
OUTPATIENT PHYSICAL THERAPY TREATMENT NOTE   Patient Name: Teresa Barnes MRN: [id number] DOB:1946/05/19, 75 y.o., female Today's Date: 09/23/2021  PCP: Leamon Arnt, MD REFERRING PROVIDER: Leamon Arnt, MD       Past Medical History:  Diagnosis Date   Anemia    Anemia of chronic disease 09/24/2018   Anxiety    Blood transfusion without reported diagnosis 07/2016   Cancer ([place]) 1991   breast   Chronic gout 12/09/2017   On allopurinol   Diabetes mellitus without complication ([place])    Esophageal stricture 12/09/2017   S/p dilation by GI: Dr. Scarlette Shorts; due to h/o severe recurrent erosive esophagitis.   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   Injected left foot every 19 2018   GERD (gastroesophageal reflux disease)    Glaucoma    Hiatal hernia 12/09/2017   Hyperlipidemia    Hypertension    Right rotator cuff tear 11/24/2015   Injected this and 26 2017   Wears glasses    Past Surgical History:  Procedure Laterality Date   ANTERIOR CERVICAL DECOMP/DISCECTOMY FUSION N/A 03/26/2019   Procedure: Anterior Cervical Discectomy Fusion - Cervical Three-Cervical Four - Cervical Four-Cervical Five - Cervical Five-Cervical Six;  Surgeon: Eustace Moore, MD;  Location: [place];  Service: Neurosurgery;  Laterality: N/A;  Anterior Cervical Discectomy Fusion - Cervical Three-Cervical Four - Cervical Four-Cervical Five - Cervical Five-Cervical Six   CATARACT EXTRACTION W/ INTRAOCULAR LENS  IMPLANT, BILATERAL  2000   COLONOSCOPY     cyst on spine     ESOPHAGOGASTRODUODENOSCOPY (EGD) WITH PROPOFOL N/A 07/31/2016   Procedure: ESOPHAGOGASTRODUODENOSCOPY (EGD) WITH PROPOFOL;  Surgeon: Manus Gunning, MD;  [facility];  Service: Gastroenterology;  Laterality: N/A;   EYE SURGERY  2011   lt eye blled   MASTECTOMY MODIFIED RADICAL Right 1990   REDUCTION MAMMAPLASTY Left    TRIGGER FINGER RELEASE Right 06/24/2013   Procedure: RELEASE TRIGGER FINGER/A-1 PULLEY PIP RIGHT LONG FINGER;   Surgeon: Cammie Sickle., MD;  Location: [place];  Service: Orthopedics;  Laterality: Right;   TRIGGER FINGER RELEASE     TUBAL LIGATION     Patient Active Problem List   Diagnosis Date Noted   Type 2 diabetes mellitus with stage 4 chronic kidney disease, without long-term current use of insulin ([place]) 08/09/2020   CKD (chronic kidney disease) stage 4, GFR 15-29 ml/min ([place]) 05/31/2020   Acute bursitis of right shoulder 06/03/2019   S/P cervical spinal fusion 03/26/2019   Anemia of chronic disease 09/24/2018   Adhesive bursitis of left shoulder 08/05/2018   History of right breast cancer 12/09/2017   Mixed hyperlipidemia 12/09/2017   Hypertension associated with diabetes ([place]) 12/09/2017   Hiatal hernia 12/09/2017   Esophageal stricture 12/09/2017   Chronic gout 12/09/2017   Glaucoma 12/09/2017   Greater trochanteric bursitis of right hip 11/17/2017   Degenerative arthritis of right knee 07/03/2017   H/O: upper GI bleed 07/30/2016   Right lumbar radiculopathy 01/23/2016   Arthritis of right acromioclavicular joint 01/02/2016    REFERRING DIAG: R29.898 (ICD-10-CM) - Weakness of lower extremity, unspecified laterality   THERAPY DIAG:  No diagnosis found.  PERTINENT HISTORY: T2DM, h/o CA, chronic gout, h/o Rt hip/knee pain, Rt lumbar radiculopathy  PRECAUTIONS: fall  SUBJECTIVE:***  PAIN:  Are you having pain? No     OBJECTIVE:  PATIENT SURVEYS:  LEFS 27   COGNITION:            Overall cognitive status:  Within functional limits for tasks assessed                          SENSATION:            WFL     MUSCLE LENGTH: Gross limitations in LE flexibility: HS, quads, hip flexors, DF         LE AROM/PROM:   A/PROM Right 08/10/2021 Left 08/10/2021  Hip flexion      Hip abduction      Hip adduction      Hip extension      Hip internal rotation      Hip external rotation      Knee flexion      Knee extension      Ankle dorsiflexion       Ankle plantarflexion      Ankle inversion      Ankle eversion       (Blank rows = not tested)   LE MMT:   MMT Right 08/10/2021 Left 08/10/2021  Hip flexion 5/5 5/5  Hip abduction      Hip adduction      Hip extension      Hip internal rotation      Hip external rotation      Knee flexion 4/5 4/5  Knee extension 5/5 5/5  Ankle dorsiflexion 5/5 5/5  Ankle plantarflexion      Ankle inversion      Ankle eversion       (Blank rows = not tested)         FUNCTIONAL TESTS:  5 times sit to stand: 24s, legs pressing against chair  11/4: 20s Berg Balance Scale: 25   GAIT: Distance walked: about 50 feet from check in to clinic Assistive device utilized: Environmental consultant - 4 wheeled Level of assistance: Modified independence Comments: shuffling heard from bil feet  TODAY'S TREATMENT: 11/7  ***  11/4 Nu step 7 min L4 UE & LE Sit<>stand with UE assist with and without airex under feet Attempted sit<>stand without UE assist Manual therapy: STM to Rt piriformis Gait with SPC   10/20  Nu step 5 min L7 Ue & LE  Gait: with cane 3 trials: clinic<> teaching kitchen, clinic<>IT cafe, clinic<>double doors  10/18  Nu step 5 min L4 Ue & Le  Gait- walking in building with SPC- 1 lap clinic<>cafe, 1 lap clinic<>security desk, 1 lap clinic <>just outside of double doors  Sit<>stand 3x5   10/13  Nu step 5 min L5 UE & LE  Walking with RW- cues for step height & heel strike  LAQ holds for HS stretch  Seated march  Sit<>stand without UE assist  Standing rows red tband, alt rows in wide tandem, bil GHJ ext  Walking without AD   10/5  Stationary bike 5 min L1.7  Gait training- incr step height and heel strike with RW  Standing glut sets  Standing hamstring curls  Standing hip abduction  4" step taps  Standing hip extension in bar plank  Heel raises followed by gastroc stretch  PATIENT EDUCATION:  Education details: exercise form/rationale Person educated: Patient Education  method: Consulting civil engineer, Demonstration, Tactile cues, Verbal cues, and Handouts Education comprehension: verbalized understanding, returned demonstration, verbal cues required, tactile cues required, and needs further education     HOME EXERCISE PROGRAM: [id number]   ASSESSMENT:   CLINICAL IMPRESSION: ***  Demo ambulation without shuffling of Lt foot with very minimal shuffling of Rt. Still  not clearing opp foot in step length but does not present LOB. PT cont to provide CGA for safety. Required cuing and encouragement as she was too nervous to stand from the chair wihtout using her hands with feet on airex pad.    REHAB POTENTIAL: Good   CLINICAL DECISION MAKING: Unstable/unpredictable   EVALUATION COMPLEXITY: High     GOALS: Goals reviewed with patient? Yes   SHORT TERM GOALS:   STG Name Target Date Goal status  1 Pt will be able to navigate stairs in and out of the pool independently with use of hand rails Baseline: expected to require assistance in her first visit for learning & confidence 08/31/21 Deferred-does not want to do pool  2 Pt will be able to demo high stepping gait pattern in pool, with noodle support at least one lap across pool without LOB Baseline: will evaluate in pool, consistent shuffling on land for fear of falling 08/31/21   Deferred- does not want to do pool                                                 LONG TERM GOALS:    LTG Name Target Date Goal status  1 5TSTS to improve to at least 18s Baseline: 24s at eval 10/05/21 INITIAL  2 BERG to improve by [place] Baseline: 25/56 at eval 10/05/21 INITIAL  3 Pt will feel confident in her ability to walk household distances with [facility] or less Baseline: feels that she needs 4WW at eval 10/05/21 INITIAL                                        PLAN: PT FREQUENCY: 1-2x/week   PT DURATION: 8 weeks   PLANNED INTERVENTIONS: Therapeutic exercises, Therapeutic activity, Neuro Muscular re-education, Balance  training, Gait training, Patient/Family education, Joint mobilization, Stair training, Aquatic Therapy, Cryotherapy, Moist heat, Taping, and Manual therapy   PLAN FOR NEXT SESSION: continue gait with cane, steps    Elizah Lydon C. Camarion Weier PT, DPT 09/23/21 8:47 PM

## 2021-09-24 ENCOUNTER — Ambulatory Visit (HOSPITAL_BASED_OUTPATIENT_CLINIC_OR_DEPARTMENT_OTHER): Payer: Medicare Other | Admitting: Physical Therapy

## 2021-09-26 ENCOUNTER — Encounter (HOSPITAL_BASED_OUTPATIENT_CLINIC_OR_DEPARTMENT_OTHER): Payer: Medicare Other | Admitting: Physical Therapy

## 2021-09-27 ENCOUNTER — Ambulatory Visit: Payer: Medicare Other

## 2021-10-02 ENCOUNTER — Ambulatory Visit (HOSPITAL_BASED_OUTPATIENT_CLINIC_OR_DEPARTMENT_OTHER): Payer: Medicare Other | Admitting: Physical Therapy

## 2021-10-05 ENCOUNTER — Ambulatory Visit (HOSPITAL_BASED_OUTPATIENT_CLINIC_OR_DEPARTMENT_OTHER): Payer: Medicare Other | Admitting: Physical Therapy

## 2021-10-16 ENCOUNTER — Other Ambulatory Visit: Payer: Self-pay

## 2021-10-16 ENCOUNTER — Ambulatory Visit (INDEPENDENT_AMBULATORY_CARE_PROVIDER_SITE_OTHER): Payer: Medicare Other | Admitting: Family Medicine

## 2021-10-16 ENCOUNTER — Encounter: Payer: Self-pay | Admitting: Family Medicine

## 2021-10-16 VITALS — BP 124/76 | HR 91 | Temp 97.7°F | Ht 64.0 in | Wt 176.2 lb

## 2021-10-16 DIAGNOSIS — N184 Chronic kidney disease, stage 4 (severe): Secondary | ICD-10-CM

## 2021-10-16 DIAGNOSIS — E1122 Type 2 diabetes mellitus with diabetic chronic kidney disease: Secondary | ICD-10-CM | POA: Diagnosis not present

## 2021-10-16 DIAGNOSIS — E1159 Type 2 diabetes mellitus with other circulatory complications: Secondary | ICD-10-CM | POA: Diagnosis not present

## 2021-10-16 DIAGNOSIS — I152 Hypertension secondary to endocrine disorders: Secondary | ICD-10-CM

## 2021-10-16 LAB — POCT GLYCOSYLATED HEMOGLOBIN (HGB A1C): Hemoglobin A1C: 8.2 % — AB (ref 4.0–5.6)

## 2021-10-16 MED ORDER — DAPAGLIFLOZIN PROPANEDIOL 10 MG PO TABS: 10.0000 mg | ORAL_TABLET | Freq: Every day | ORAL | 3 refills | Status: DC

## 2021-10-16 NOTE — Progress Notes (Signed)
Subjective  CC:  Chief Complaint  Patient presents with   Diabetes   Hypertension   Chronic Kidney Disease    HPI: Teresa Barnes is a 75 y.o. female who presents to the office today for follow up of diabetes and problems listed above in the chief complaint.  Diabetes follow up: Her diabetic control is reported as  unchanged. On farxiga 5 and tolerating well . Diet is fine (outside of the holiday last week).  She denies exertional CP or SOB or symptomatic hypoglycemia. She denies foot sores or paresthesias.  CKD with anemia: sees renal next week; has lab appt with [facility]. No edema.  HTN: reports home readings are normal: 120s/70s. Has white coat component in office. No cp or sob  Wt Readings from Last 3 Encounters:  10/16/21 176 lb 3.2 oz (79.9 kg)  09/21/21 175 lb (79.4 kg)  08/06/21 175 lb 6.4 oz (79.6 kg)    BP Readings from Last 3 Encounters:  10/16/21 124/76  09/21/21 (!) 142/60  08/06/21 140/78    Assessment  1. Type 2 diabetes mellitus with stage 4 chronic kidney disease, without long-term current use of insulin ([place])   2. CKD (chronic kidney disease) stage 4, GFR 15-29 ml/min ([facility])   3. Hypertension associated with diabetes ([place])      Plan  Diabetes is currently marginally controlled. Will increase farxiga to 10 and recheck 3 months. May need to restart glp-1.  CKD for renal f/u next week. Anemia on iron.  HTN: good control. Continue home monitoring. Continue losartan 50  Follow up: 3 mo to recheck diabetes. Orders Placed This Encounter  Procedures   POCT HgB A1C   Meds ordered this encounter  Medications   dapagliflozin propanediol (FARXIGA) 10 MG TABS tablet    Sig: Take 1 tablet (10 mg total) by mouth daily.    Dispense:  90 tablet    Refill:  3    Increasing dose from 5. thanks       Immunization History  Administered Date(s) Administered   Fluad Quad(high Dose 65+) 08/18/2019, 08/06/2021   Influenza, High Dose Seasonal PF 08/18/2017,  07/15/2018, 08/14/2020   Influenza,inj,Quad PF,6+ Mos 08/01/2016   Influenza-Unspecified 08/18/2014   Moderna Covid-19 Vaccine Bivalent Booster 18yrs & up 08/16/2021   Moderna Sars-Covid-2 Vaccination 01/01/2020, 01/29/2020, 09/25/2020   Pneumococcal Conjugate-13 12/09/2017   Pneumococcal Polysaccharide-23 09/24/2018   Zoster Recombinat (Shingrix) 10/21/2018, 01/27/2019    Diabetes Related Lab Review: Lab Results  Component Value Date   HGBA1C 8.2 (A) 10/16/2021   HGBA1C 6.3 (A) 08/06/2021   HGBA1C 6.4 04/12/2021    Lab Results  Component Value Date   MICROALBUR 8.1 (H) 03/09/2018   Lab Results  Component Value Date   CREATININE 1.56 (H) 04/12/2021   BUN 37 (H) 04/12/2021   NA 141 04/12/2021   K 4.6 04/12/2021   CL 109 04/12/2021   CO2 25 04/12/2021   Lab Results  Component Value Date   CHOL 150 04/12/2021   CHOL 171 05/03/2020   CHOL 134 08/18/2019   Lab Results  Component Value Date   HDL 57.50 04/12/2021   HDL 52.80 05/03/2020   HDL 47.70 08/18/2019   Lab Results  Component Value Date   LDLCALC 83 04/12/2021   LDLCALC 100 (H) 05/03/2020   LDLCALC 71 08/18/2019   Lab Results  Component Value Date   TRIG 49.0 04/12/2021   TRIG 91.0 05/03/2020   TRIG 78.0 08/18/2019   Lab Results  Component  Value Date   CHOLHDL 3 04/12/2021   CHOLHDL 3 05/03/2020   CHOLHDL 3 08/18/2019   No results found for: LDLDIRECT The 10-year ASCVD risk score (Arnett DK, et al., 2019) is: 28.1%   Values used to calculate the score:     Age: 52 years     Sex: Female     Is Non-Hispanic African American: Yes     Diabetic: Yes     Tobacco smoker: No     Systolic Blood Pressure: 124 mmHg     Is BP treated: Yes     HDL Cholesterol: 57.5 mg/dL     Total Cholesterol: 150 mg/dL I have reviewed the PMH, Fam and Soc history. Patient Active Problem List   Diagnosis Date Noted   Type 2 diabetes mellitus with stage 4 chronic kidney disease, without long-term current use of insulin  ([place]) 08/09/2020    Priority: High   CKD (chronic kidney disease) stage 4, GFR 15-29 ml/min ([place]) 05/31/2020    Priority: High    Normal ultrasound, 07/2020; referred to renal 05/2020    Mixed hyperlipidemia 12/09/2017    Priority: High   Hypertension associated with diabetes ([place]) 12/09/2017    Priority: High   H/O: upper GI bleed 07/30/2016    Priority: High    Due to severe erosive esophagitis; 2017; healed with PPI by egd f/u; has [place] and stricture; s/p blood transfusion. 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 Scarlette Shorts; due to h/o severe recurrent erosive esophagitis.    Chronic gout 12/09/2017    Priority: Medium     On allopurinol    Glaucoma 12/09/2017    Priority: Medium    Degenerative arthritis of right knee 07/03/2017    Priority: Medium     Injected 07/03/2017' Injected August 05, 2018    Right lumbar radiculopathy 01/23/2016    Priority: Medium    Arthritis of right acromioclavicular joint 01/02/2016    Priority: Medium     Injected 01/02/2016 Repeat injection 03/12/2016    Greater trochanteric bursitis of right hip 11/17/2017    Priority: Low    Injected November 17, 2017 Repeat injection May 05, 2018    Acute bursitis of right shoulder 06/03/2019    Injected in June 03, 2019    Adhesive bursitis of left shoulder 08/05/2018    Injected August 05, 2018     Social History: Patient  reports that she quit smoking about 28 years ago. Her smoking use included cigarettes. She has a 2.50 pack-year smoking history. She has never used smokeless tobacco. She  reports that she does not drink alcohol and does not use drugs.  Review of Systems: Ophthalmic: negative for eye pain, loss of vision or double vision Cardiovascular: negative for chest pain Respiratory: negative for SOB or persistent cough Gastrointestinal: negative for abdominal pain Genitourinary: negative for dysuria or gross hematuria MSK: negative for foot lesions Neurologic: negative for weakness or gait disturbance  Objective  Vitals: BP 124/76 Comment: by home readings  Pulse 91   Temp 97.7 F (36.5 C) (Temporal)   Ht 5\' 4"  (1.626 m)   Wt 176 lb 3.2 oz (79.9 kg)   SpO2 98%   BMI 30.24 kg/m  General: well appearing, no acute distress  Psych:  Alert and oriented, normal mood and affect HEENT:  Normocephalic, atraumatic, moist mucous membranes, supple neck  Cardiovascular:  Nl S1 and S2, RRR without murmur, gallop or rub. no edema Respiratory:  Good breath sounds bilaterally, CTAB with normal effort, no rales     Diabetic education: ongoing education regarding chronic disease management for diabetes was given today. We continue to reinforce the ABC's of diabetic management: A1c (<7 or 8 dependent upon patient), tight blood pressure control, and cholesterol management with goal LDL < 100 minimally. We discuss diet strategies, exercise recommendations, medication options and possible side effects. At each visit, we review recommended immunizations and preventive care recommendations for diabetics and stress that good diabetic control can prevent other problems. See below for this patient's data.   Commons side effects, risks, benefits, and alternatives for medications and treatment plan prescribed today were discussed, and the patient expressed understanding of the given instructions. Patient is instructed to call or message via MyChart if he/she has any questions or concerns regarding our treatment plan. No barriers to understanding were identified. We discussed Red Flag  symptoms and signs in detail. Patient expressed understanding regarding what to do in case of urgent or emergency type symptoms.  Medication list was reconciled, printed and provided to the patient in AVS. Patient instructions and summary information was reviewed with the patient as documented in the AVS. This note was prepared with assistance of Dragon voice recognition software. Occasional wrong-word or sound-a-like substitutions may have occurred due to the inherent limitations of voice recognition software  This visit occurred during the SARS-CoV-2 public health emergency.  Safety protocols were in place, including screening questions prior to the visit, additional usage of staff PPE, and extensive cleaning of exam room while observing appropriate contact time as indicated for disinfecting solutions.

## 2021-10-16 NOTE — Patient Instructions (Signed)
Please return in 3 months for diabetes follow up   Please increase farxiga to 10mg  daily.   If you have any questions or concerns, please don't hesitate to send me a message via MyChart or call the office at [phone]. Thank you for visiting with [place] today! It's our pleasure caring for you.

## 2021-10-18 ENCOUNTER — Ambulatory Visit (INDEPENDENT_AMBULATORY_CARE_PROVIDER_SITE_OTHER): Payer: Medicare Other

## 2021-10-18 ENCOUNTER — Other Ambulatory Visit: Payer: Self-pay

## 2021-10-18 DIAGNOSIS — Z0000 Encounter for general adult medical examination without abnormal findings: Secondary | ICD-10-CM | POA: Diagnosis not present

## 2021-10-18 NOTE — Progress Notes (Signed)
Virtual Visit via Telephone Note  I connected with  Teresa Barnes on 10/18/21 at  1:00 PM EST by telephone and verified that I am speaking with the correct person using two identifiers.  Medicare Annual Wellness visit completed telephonically due to Covid-19 pandemic.   Persons participating in this call: This Health Coach and this patient.   Location: Patient: Home Provider: Office    I discussed the limitations, risks, security and privacy concerns of performing an evaluation and management service by telephone and the availability of in person appointments. The patient expressed understanding and agreed to proceed.  Unable to perform video visit due to video visit attempted and failed and/or patient does not have video capability.   Some vital signs may be absent or patient reported.   Willette Brace, LPN   Subjective:   Teresa Barnes is a 75 y.o. female who presents for Medicare Annual (Subsequent) preventive examination.  Review of Systems     Cardiac Risk Factors include: advanced age (>79men, >87 women);diabetes mellitus;hypertension;dyslipidemia;obesity (BMI >30kg/m2)     Objective:    There were no vitals filed for this visit. There is no height or weight on file to calculate BMI.  Advanced Directives 10/18/2021 08/10/2021 11/21/2020 09/21/2020 07/19/2019 03/29/2019 03/26/2019  Does Patient Have a Medical Advance Directive? No Yes No No No No No  Type of Advance Directive - Living will - - - - -  Does patient want to make changes to medical advance directive? - - - No - Patient declined - - -  Would patient like information on creating a medical advance directive? No - Patient declined - No - Patient declined - No - Patient declined No - Patient declined No - Patient declined    Current Medications (verified) Outpatient Encounter Medications as of 10/18/2021  Medication Sig   Cholecalciferol (VITAMIN D3 PO) Take 1,000 mg by mouth daily.   dapagliflozin propanediol  (FARXIGA) 10 MG TABS tablet Take 1 tablet (10 mg total) by mouth daily.   dorzolamide-timolol (COSOPT) 22.3-6.8 MG/ML ophthalmic solution Place 1 drop into both eyes 2 (two) times daily.   ferrous sulfate 325 (65 FE) MG tablet Take 325 mg by mouth daily with breakfast.   latanoprost (XALATAN) 0.005 % ophthalmic solution Place 1 drop into both eyes at bedtime.   losartan (COZAAR) 50 MG tablet Take 50 mg by mouth daily.   omeprazole (PRILOSEC) 20 MG capsule Take 20 mg by mouth daily.   rosuvastatin (CRESTOR) 20 MG tablet Take 1 tablet by mouth once daily   vitamin B-12 (CYANOCOBALAMIN) 500 MCG tablet Take 500 mcg by mouth every other day.   No facility-administered encounter medications on file as of 10/18/2021.    Allergies (verified) Prednisone and Tramadol   History: Past Medical History:  Diagnosis Date   Anemia    Anemia of chronic disease 09/24/2018   Anxiety    Blood transfusion without reported diagnosis 07/2016   Cancer ([place]) 1991   breast   Chronic gout 12/09/2017   On allopurinol   Diabetes mellitus without complication ([place])    Esophageal stricture 12/09/2017   S/p dilation by GI: Dr. Scarlette Shorts; due to h/o severe recurrent erosive esophagitis.   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   Injected left foot every 19 2018   GERD (gastroesophageal reflux disease)    Glaucoma    Hiatal hernia 12/09/2017   Hyperlipidemia    Hypertension    Right rotator cuff tear 11/24/2015   Injected this and 26  2017   Wears glasses    Past Surgical History:  Procedure Laterality Date   ANTERIOR CERVICAL DECOMP/DISCECTOMY FUSION N/A 03/26/2019   Procedure: Anterior Cervical Discectomy Fusion - Cervical Three-Cervical Four - Cervical Four-Cervical Five - Cervical Five-Cervical Six;  Surgeon: Eustace Moore, MD;  Location: [facility];  Service: Neurosurgery;  Laterality: N/A;  Anterior Cervical Discectomy Fusion - Cervical Three-Cervical Four - Cervical Four-Cervical Five - Cervical Five-Cervical Six    CATARACT EXTRACTION W/ INTRAOCULAR LENS  IMPLANT, BILATERAL  2000   COLONOSCOPY     cyst on spine     ESOPHAGOGASTRODUODENOSCOPY (EGD) WITH PROPOFOL N/A 07/31/2016   Procedure: ESOPHAGOGASTRODUODENOSCOPY (EGD) WITH PROPOFOL;  Surgeon: Manus Gunning, MD;  Location: [facility];  Service: Gastroenterology;  Laterality: N/A;   EYE SURGERY  2011   lt eye blled   MASTECTOMY MODIFIED RADICAL Right 1990   REDUCTION MAMMAPLASTY Left    TRIGGER FINGER RELEASE Right 06/24/2013   Procedure: RELEASE TRIGGER FINGER/A-1 PULLEY PIP RIGHT LONG FINGER;  Surgeon: Cammie Sickle., MD;  Location: [facility];  Service: Orthopedics;  Laterality: Right;   TRIGGER FINGER RELEASE     TUBAL LIGATION     Family History  Problem Relation Age of Onset   Diabetes Mother    Diabetes Daughter    Arthritis Daughter    Hyperlipidemia Daughter    Diabetes Son    Arthritis Son    Diabetes Sister    Colon cancer Neg Hx    Cancer Neg Hx    Heart disease Neg Hx    Breast cancer Neg Hx    Stomach cancer Neg Hx    Pancreatic cancer Neg Hx    Esophageal cancer Neg Hx    Liver disease Neg Hx    Social History   Socioeconomic History   Marital status: Married    Spouse name: robert   Number of children: 2   Years of education: Not on file   Highest education level: Not on file  Occupational History   Occupation: retired Educational psychologist; [place]: 2006  Tobacco Use   Smoking status: Former    Packs/day: 0.25    Years: 10.00    Pack years: 2.50    Types: Cigarettes    Quit date: 06/18/1993    Years since quitting: 28.3   Smokeless tobacco: Never  Vaping Use   Vaping Use: Never used  Substance and Sexual Activity   Alcohol use: No   Drug use: No   Sexual activity: Never  Other Topics Concern   Not on file  Social History Narrative   Originally from [place], [place]; worked in [employer] for [employer]; longevity in family   Social Determinants of Radio broadcast assistant  Strain: Low Risk    Difficulty of Paying Living Expenses: Not hard at all  Food Insecurity: No Food Insecurity   Worried About Charity fundraiser in the Last Year: Never true   Arboriculturist in the Last Year: Never true  Transportation Needs: No Transportation Needs   Lack of Transportation (Medical): No   Lack of Transportation (Non-Medical): No  Physical Activity: Inactive   Days of Exercise per Week: 0 days   Minutes of Exercise per Session: 0 min  Stress: No Stress Concern Present   Feeling of Stress : Not at all  Social Connections: Moderately Integrated   Frequency of Communication with Friends and Family: More than three times a week  Frequency of Social Gatherings with Friends and Family: More than three times a week   Attends Religious Services: More than 4 times per year   Active Member of [employer] or Organizations: No   Attends Music therapist: Never   Marital Status: Married    Tobacco Counseling Counseling given: Not Answered   Clinical Intake:  Pre-visit preparation completed: Yes  Pain : No/denies pain     BMI - recorded: 30.24 Nutritional Status: BMI > 30  Obese Nutritional Risks: None Diabetes: Yes CBG done?: No Did pt. bring in CBG monitor from home?: No  How often do you need to have someone help you when you read instructions, pamphlets, or other written materials from your doctor or pharmacy?: 1 - Never  Diabetic?Nutrition Risk Assessment:  Has the patient had any N/V/D within the last 2 months?  No  Does the patient have any non-healing wounds?  No  Has the patient had any unintentional weight loss or weight gain?  No   Diabetes:  Is the patient diabetic?  Yes  If diabetic, was a CBG obtained today?  No  Did the patient bring in their glucometer from home?  No  How often do you monitor your CBG's? As needed.   Financial Strains and Diabetes Management:  Are you having any financial strains with the device, your supplies or  your medication? No .  Does the patient want to be seen by Chronic Care Management for management of their diabetes?  No  Would the patient like to be referred to a Nutritionist or for Diabetic Management?  No   Diabetic Exams:  Diabetic Eye Exam: Completed 07/30/21 Diabetic Foot Exam: Completed 04/12/21   Interpreter Needed?: No  Information entered by :: Charlott Rakes, LPN   Activities of Daily Living In your present state of health, do you have any difficulty performing the following activities: 10/18/2021  Hearing? N  Vision? N  Difficulty concentrating or making decisions? N  Walking or climbing stairs? N  Dressing or bathing? N  Doing errands, shopping? N  Preparing Food and eating ? N  Using the Toilet? N  In the past six months, have you accidently leaked urine? N  Do you have problems with loss of bowel control? N  Managing your Medications? N  Managing your Finances? N  Housekeeping or managing your Housekeeping? N  Some recent data might be hidden    Patient Care Team: Leamon Arnt, MD as PCP - General (Family Medicine) Lyndal Pulley, DO as Consulting Physician (Family Medicine) Irene Shipper, MD as Consulting Physician (Gastroenterology) Hayden Pedro, MD as Consulting Physician (Ophthalmology) Marzetta Board, DPM as Consulting Physician (Podiatry) Eustace Moore, MD as Consulting Physician (Neurosurgery) Andee Lineman, NP as Nurse Practitioner (Nephrology)  Indicate any recent Medical Services you may have received from other than [facility] providers in the past year (date may be approximate).     Assessment:   This is a routine wellness examination for [place].  Hearing/Vision screen Hearing Screening - Comments:: Pt denies any hearing issues  Vision Screening - Comments:: Pt follows Dr Alanda Slim for annual eye exams   Dietary issues and exercise activities discussed: Current Exercise Habits: The patient does not participate in regular exercise  at present   Goals Addressed             This Visit's Progress    Patient Stated       Get rid of walker  Depression Screen PHQ 2/9 Scores 10/18/2021 10/16/2021 09/21/2020 07/19/2019 04/21/2019 07/15/2018 03/09/2018  PHQ - 2 Score 0 0 0 0 0 0 0  PHQ- 9 Score - - - - - - 0    Fall Risk Fall Risk  10/18/2021 10/16/2021 09/21/2020 07/19/2019 04/21/2019  Falls in the past year? 0 0 0 0 0  Number falls in past yr: 0 0 0 0 -  Injury with Fall? 0 0 0 0 -  Risk for fall due to : Impaired vision - Impaired mobility;Impaired vision Impaired balance/gait;Impaired mobility -  Risk for fall due to: Comment use walker for safety - - - -  Follow up Falls prevention discussed - Falls prevention discussed Education provided -    FALL RISK PREVENTION PERTAINING TO THE HOME:  Any stairs in or around the home? Yes  If so, are there any without handrails? No  Home free of loose throw rugs in walkways, pet beds, electrical cords, etc? Yes  Adequate lighting in your home to reduce risk of falls? Yes   ASSISTIVE DEVICES UTILIZED TO PREVENT FALLS:  Life alert? No but have alarm system  Use of a cane, walker or w/c? Yes  Grab bars in the bathroom? Yes  Shower chair or bench in shower? Yes  Elevated toilet seat or a handicapped toilet? Yes   TIMED UP AND GO:  Was the test performed? No .  Cognitive Function: MMSE - Mini Mental State Exam 07/15/2018  Orientation to time 5  Orientation to Place 5  Registration 3  Attention/ Calculation 5  Recall 0  Language- name 2 objects 2  Language- repeat 1  Language- follow 3 step command 3  Language- read & follow direction 1  Write a sentence 1  Copy design 1  Total score 27     6CIT Screen 10/18/2021 09/21/2020  What Year? 0 points 0 points  What month? 0 points 0 points  What time? 0 points -  Count back from 20 0 points 0 points  Months in reverse 0 points 0 points  Repeat phrase 0 points 0 points  Total Score 0 -     Immunizations Immunization History  Administered Date(s) Administered   Fluad Quad(high Dose 65+) 08/18/2019, 08/06/2021   Influenza, High Dose Seasonal PF 08/18/2017, 07/15/2018, 08/14/2020   Influenza,inj,Quad PF,6+ Mos 08/01/2016   Influenza-Unspecified 08/18/2014   Moderna Covid-19 Vaccine Bivalent Booster 70yrs & up 08/16/2021   Moderna Sars-Covid-2 Vaccination 01/01/2020, 01/29/2020, 09/25/2020   Pneumococcal Conjugate-13 12/09/2017   Pneumococcal Polysaccharide-23 09/24/2018   Zoster Recombinat (Shingrix) 10/21/2018, 01/27/2019    TDAP status: Up to date  Flu Vaccine status: Up to date  Pneumococcal vaccine status: Up to date  Covid-19 vaccine status: Completed vaccines  Qualifies for Shingles Vaccine? Yes   Zostavax completed Yes   Shingrix Completed?: Yes  Screening Tests Health Maintenance  Topic Date Due   MAMMOGRAM  02/27/2022   FOOT EXAM  04/12/2022   HEMOGLOBIN A1C  04/15/2022   OPHTHALMOLOGY EXAM  07/30/2022   DEXA SCAN  02/17/2023   Pneumonia Vaccine 57+ Years old  Completed   INFLUENZA VACCINE  Completed   COVID-19 Vaccine  Completed   Hepatitis C Screening  Completed   Zoster Vaccines- Shingrix  Completed   HPV VACCINES  Aged Out   COLONOSCOPY (Pts 45-30yrs Insurance coverage will need to be confirmed)  Discontinued    Health Maintenance  There are no preventive care reminders to display for this patient.  Colorectal cancer screening: No  longer required.   Mammogram status: Completed 02/27/21. Repeat every year  Bone Density status: Completed 02/16/18. Results reflect: Bone density results: NORMAL. Repeat every 3-5 years.  Additional Screening:  Hepatitis C Screening:  Completed 12/09/17  Vision Screening: Recommended annual ophthalmology exams for early detection of glaucoma and other disorders of the eye. Is the patient up to date with their annual eye exam?  Yes  Who is the provider or what is the name of the office in which the  patient attends annual eye exams? Dr Alanda Slim If pt is not established with a provider, would they like to be referred to a provider to establish care? No .   Dental Screening: Recommended annual dental exams for proper oral hygiene  Community Resource Referral / Chronic Care Management: CRR required this visit?  No   CCM required this visit?  No      Plan:     I have personally reviewed and noted the following in the patient's chart:   Medical and social history Use of alcohol, tobacco or illicit drugs  Current medications and supplements including opioid prescriptions.  Functional ability and status Nutritional status Physical activity Advanced directives List of other physicians Hospitalizations, surgeries, and ER visits in previous 12 months Vitals Screenings to include cognitive, depression, and falls Referrals and appointments  In addition, I have reviewed and discussed with patient certain preventive protocols, quality metrics, and best practice recommendations. A written personalized care plan for preventive services as well as general preventive health recommendations were provided to patient.     Willette Brace, LPN   97/07/4800   Nurse Notes: None

## 2021-10-18 NOTE — Patient Instructions (Signed)
[name] , Thank you for taking time to come for your Medicare Wellness Visit. I appreciate your ongoing commitment to your health goals. Please review the following plan we discussed and let me know if I can assist you in the future.   Screening recommendations/referrals: Colonoscopy: No longer required  Mammogram: Done 02/27/21 repeat every year Bone Density: Done 02/16/18 repeat every 3-5 years  Recommended yearly ophthalmology/optometry visit for glaucoma screening and checkup Recommended yearly dental visit for hygiene and checkup  Vaccinations: Influenza vaccine: Done 08/06/21 repeat every year  Pneumococcal vaccine: Up to date Shingles vaccine: Completed 10/21/18 & 01/27/19   Covid-19:Completed 2/13, 3/13, 09/25/20 & 08/16/21  Advanced directives: Advance directive discussed with you today. Even though you declined this today please call our office should you change your mind and we can give you the proper paperwork for you to fill out.  Conditions/risks identified: Get rid of walker   Next appointment: Follow up in one year for your annual wellness visit    Preventive Care 65 Years and Older, Female Preventive care refers to lifestyle choices and visits with your health care provider that can promote health and wellness. What does preventive care include? A yearly physical exam. This is also called an annual well check. Dental exams once or twice a year. Routine eye exams. Ask your health care provider how often you should have your eyes checked. Personal lifestyle choices, including: Daily care of your teeth and gums. Regular physical activity. Eating a healthy diet. Avoiding tobacco and drug use. Limiting alcohol use. Practicing safe sex. Taking low-dose aspirin every day. Taking vitamin and mineral supplements as recommended by your health care provider. What happens during an annual well check? The services and screenings done by your health care provider during your annual  well check will depend on your age, overall health, lifestyle risk factors, and family history of disease. Counseling  Your health care provider may ask you questions about your: Alcohol use. Tobacco use. Drug use. Emotional well-being. Home and relationship well-being. Sexual activity. Eating habits. History of falls. Memory and ability to understand (cognition). Work and work Statistician. Reproductive health. Screening  You may have the following tests or measurements: Height, weight, and BMI. Blood pressure. Lipid and cholesterol levels. These may be checked every 5 years, or more frequently if you are over 15 years old. Skin check. Lung cancer screening. You may have this screening every year starting at age 71 if you have a 30-pack-year history of smoking and currently smoke or have quit within the past 15 years. Fecal occult blood test (FOBT) of the stool. You may have this test every year starting at age 19. Flexible sigmoidoscopy or colonoscopy. You may have a sigmoidoscopy every 5 years or a colonoscopy every 10 years starting at age 50. Hepatitis C blood test. Hepatitis B blood test. Sexually transmitted disease (STD) testing. Diabetes screening. This is done by checking your blood sugar (glucose) after you have not eaten for a while (fasting). You may have this done every 1-3 years. Bone density scan. This is done to screen for osteoporosis. You may have this done starting at age 68. Mammogram. This may be done every 1-2 years. Talk to your health care provider about how often you should have regular mammograms. Talk with your health care provider about your test results, treatment options, and if necessary, the need for more tests. Vaccines  Your health care provider may recommend certain vaccines, such as: Influenza vaccine. This is recommended every  year. Tetanus, diphtheria, and acellular pertussis (Tdap, Td) vaccine. You may need a Td booster every 10 years. Zoster  vaccine. You may need this after age 110. Pneumococcal 13-valent conjugate (PCV13) vaccine. One dose is recommended after age 72. Pneumococcal polysaccharide (PPSV23) vaccine. One dose is recommended after age 52. Talk to your health care provider about which screenings and vaccines you need and how often you need them. This information is not intended to replace advice given to you by your health care provider. Make sure you discuss any questions you have with your health care provider. Document Released: 12/01/2015 Document Revised: 07/24/2016 Document Reviewed: 09/05/2015 Elsevier Interactive Patient Education  2017 [employer] Prevention in the Home Falls can cause injuries. They can happen to people of all ages. There are many things you can do to make your home safe and to help prevent falls. What can I do on the outside of my home? Regularly fix the edges of walkways and driveways and fix any cracks. Remove anything that might make you trip as you walk through a door, such as a raised step or threshold. Trim any bushes or trees on the path to your home. Use bright outdoor lighting. Clear any walking paths of anything that might make someone trip, such as rocks or tools. Regularly check to see if handrails are loose or broken. Make sure that both sides of any steps have handrails. Any raised decks and porches should have guardrails on the edges. Have any leaves, snow, or ice cleared regularly. Use sand or salt on walking paths during winter. Clean up any spills in your garage right away. This includes oil or grease spills. What can I do in the bathroom? Use night lights. Install grab bars by the toilet and in the tub and shower. Do not use towel bars as grab bars. Use non-skid mats or decals in the tub or shower. If you need to sit down in the shower, use a plastic, non-slip stool. Keep the floor dry. Clean up any water that spills on the floor as soon as it happens. Remove  soap buildup in the tub or shower regularly. Attach bath mats securely with double-sided non-slip rug tape. Do not have throw rugs and other things on the floor that can make you trip. What can I do in the bedroom? Use night lights. Make sure that you have a light by your bed that is easy to reach. Do not use any sheets or blankets that are too big for your bed. They should not hang down onto the floor. Have a firm chair that has side arms. You can use this for support while you get dressed. Do not have throw rugs and other things on the floor that can make you trip. What can I do in the kitchen? Clean up any spills right away. Avoid walking on wet floors. Keep items that you use a lot in easy-to-reach places. If you need to reach something above you, use a strong step stool that has a grab bar. Keep electrical cords out of the way. Do not use floor polish or wax that makes floors slippery. If you must use wax, use non-skid floor wax. Do not have throw rugs and other things on the floor that can make you trip. What can I do with my stairs? Do not leave any items on the stairs. Make sure that there are handrails on both sides of the stairs and use them. Fix handrails that are broken or  loose. Make sure that handrails are as long as the stairways. Check any carpeting to make sure that it is firmly attached to the stairs. Fix any carpet that is loose or worn. Avoid having throw rugs at the top or bottom of the stairs. If you do have throw rugs, attach them to the floor with carpet tape. Make sure that you have a light switch at the top of the stairs and the bottom of the stairs. If you do not have them, ask someone to add them for you. What else can I do to help prevent falls? Wear shoes that: Do not have high heels. Have rubber bottoms. Are comfortable and fit you well. Are closed at the toe. Do not wear sandals. If you use a stepladder: Make sure that it is fully opened. Do not climb a  closed stepladder. Make sure that both sides of the stepladder are locked into place. Ask someone to hold it for you, if possible. Clearly mark and make sure that you can see: Any grab bars or handrails. First and last steps. Where the edge of each step is. Use tools that help you move around (mobility aids) if they are needed. These include: Canes. Walkers. Scooters. Crutches. Turn on the lights when you go into a dark area. Replace any light bulbs as soon as they burn out. Set up your furniture so you have a clear path. Avoid moving your furniture around. If any of your floors are uneven, fix them. If there are any pets around you, be aware of where they are. Review your medicines with your doctor. Some medicines can make you feel dizzy. This can increase your chance of falling. Ask your doctor what other things that you can do to help prevent falls. This information is not intended to replace advice given to you by your health care provider. Make sure you discuss any questions you have with your health care provider. Document Released: 08/31/2009 Document Revised: 04/11/2016 Document Reviewed: 12/09/2014 Elsevier Interactive Patient Education  2017 [employer].

## 2021-10-25 DIAGNOSIS — R3129 Other microscopic hematuria: Secondary | ICD-10-CM | POA: Diagnosis not present

## 2021-10-25 DIAGNOSIS — N184 Chronic kidney disease, stage 4 (severe): Secondary | ICD-10-CM | POA: Diagnosis not present

## 2021-10-25 DIAGNOSIS — K2210 Ulcer of esophagus without bleeding: Secondary | ICD-10-CM | POA: Diagnosis not present

## 2021-10-25 DIAGNOSIS — E1122 Type 2 diabetes mellitus with diabetic chronic kidney disease: Secondary | ICD-10-CM | POA: Diagnosis not present

## 2021-10-25 DIAGNOSIS — I129 Hypertensive chronic kidney disease with stage 1 through stage 4 chronic kidney disease, or unspecified chronic kidney disease: Secondary | ICD-10-CM | POA: Diagnosis not present

## 2021-10-25 DIAGNOSIS — N2581 Secondary hyperparathyroidism of renal origin: Secondary | ICD-10-CM | POA: Diagnosis not present

## 2021-10-25 DIAGNOSIS — D638 Anemia in other chronic diseases classified elsewhere: Secondary | ICD-10-CM | POA: Diagnosis not present

## 2021-10-29 DIAGNOSIS — H40013 Open angle with borderline findings, low risk, bilateral: Secondary | ICD-10-CM | POA: Diagnosis not present

## 2021-10-29 DIAGNOSIS — E113593 Type 2 diabetes mellitus with proliferative diabetic retinopathy without macular edema, bilateral: Secondary | ICD-10-CM | POA: Diagnosis not present

## 2021-11-09 ENCOUNTER — Ambulatory Visit: Payer: Medicare Other | Admitting: Family Medicine

## 2021-12-07 ENCOUNTER — Ambulatory Visit: Payer: Medicare Other | Admitting: Podiatry

## 2021-12-07 ENCOUNTER — Encounter: Payer: Self-pay | Admitting: Podiatry

## 2021-12-07 DIAGNOSIS — M79675 Pain in left toe(s): Secondary | ICD-10-CM

## 2021-12-07 DIAGNOSIS — B351 Tinea unguium: Secondary | ICD-10-CM | POA: Diagnosis not present

## 2021-12-07 DIAGNOSIS — M79674 Pain in right toe(s): Secondary | ICD-10-CM

## 2021-12-15 NOTE — Progress Notes (Signed)
°  Subjective:  Patient ID: Teresa Barnes, female    DOB: 1946/11/07,  MRN: [id number]  Teresa Barnes presents to clinic today for preventative diabetic foot care and painful elongated mycotic toenails 1-5 bilaterally which are tender when wearing enclosed shoe gear. Pain is relieved with periodic professional debridement.  Patient did not check blood glucose today.  New problem(s): None. ingrown toenail to the L hallux  PCP is Leamon Arnt, MD , and last visit was 10/16/2021.  Allergies  Allergen Reactions   Prednisone Nausea And Vomiting   Tramadol Nausea Only    Review of Systems: Negative except as noted in the HPI. Objective:   Constitutional Teresa Barnes is a pleasant 76 y.o. African American female, WD, WN in NAD. [name] x 3.   Vascular CFT <3 seconds b/l LE. Faintly palpable DP pulses b/l LE. Faintly palpable PT pulse(s) b/l LE. Pedal hair absent. No pain with calf compression b/l. Lower extremity skin temperature gradient within normal limits. No edema noted b/l LE. No ischemia or gangrene noted b/l LE. No cyanosis or clubbing noted b/l LE.  Neurologic Normal speech. Oriented to person, place, and time. Protective sensation intact 5/5 intact bilaterally with 10g monofilament b/l.  Dermatologic Pedal skin is warm and supple b/l LE. No open wounds b/l LE. No interdigital macerations noted b/l LE. Toenails 2-5 bilaterally and R hallux elongated, discolored, dystrophic, thickened, and crumbly with subungual debris and tenderness to dorsal palpation. Incurvated nailplate medial border(s) L hallux.  Nail border hypertrophy minimal. There is tenderness to palpation. Sign(s) of infection: no clinical signs of infection noted on examination today.. No hyperkeratotic nor porokeratotic lesions present on today's visit.  Orthopedic: Normal muscle strength 5/5 to all lower extremity muscle groups bilaterally. No pain, crepitus or joint limitation noted with ROM b/l LE. No gross bony pedal  deformities b/l. Patient ambulates independently without assistive aids.   Radiographs: None  Last A1c:  Hemoglobin A1C Latest Ref Rng & Units 10/16/2021 08/06/2021 04/12/2021 01/12/2021  HGBA1C 4.0 - 5.6 % 8.2(A) 6.3(A) 6.4 6.0(A)  Some recent data might be hidden   Assessment:   1. Pain due to onychomycosis of toenails of both feet    Plan:  -Examined patient. -Mycotic toenails 2-5 bilaterally and R hallux were debrided in length and girth with sterile nail nippers and dremel without iatrogenic bleeding. -Offending nail border debrided and curretaged L hallux utilizing sterile nail nipper and currette. Border(s) cleansed with alcohol and triple antibiotic ointment applied. Patient instructed to apply Neosporin Cream  to L hallux once daily for 7 days. -Patient/POA to call should there be question/concern in the interim.  Return in about 3 months (around 03/07/2022).  Marzetta Board, DPM

## 2021-12-28 ENCOUNTER — Ambulatory Visit: Payer: Medicare Other | Admitting: Podiatry

## 2022-01-24 ENCOUNTER — Other Ambulatory Visit: Payer: Self-pay | Admitting: Family Medicine

## 2022-01-24 DIAGNOSIS — Z1231 Encounter for screening mammogram for malignant neoplasm of breast: Secondary | ICD-10-CM

## 2022-02-04 ENCOUNTER — Encounter: Payer: Self-pay | Admitting: Family Medicine

## 2022-02-04 ENCOUNTER — Ambulatory Visit (INDEPENDENT_AMBULATORY_CARE_PROVIDER_SITE_OTHER): Payer: Medicare Other | Admitting: Family Medicine

## 2022-02-04 VITALS — BP 147/54 | HR 49 | Temp 97.6°F | Ht 64.0 in | Wt 180.0 lb

## 2022-02-04 DIAGNOSIS — N184 Chronic kidney disease, stage 4 (severe): Secondary | ICD-10-CM | POA: Diagnosis not present

## 2022-02-04 DIAGNOSIS — I10 Essential (primary) hypertension: Secondary | ICD-10-CM | POA: Diagnosis not present

## 2022-02-04 DIAGNOSIS — E1122 Type 2 diabetes mellitus with diabetic chronic kidney disease: Secondary | ICD-10-CM | POA: Diagnosis not present

## 2022-02-04 DIAGNOSIS — E1159 Type 2 diabetes mellitus with other circulatory complications: Secondary | ICD-10-CM

## 2022-02-04 DIAGNOSIS — I152 Hypertension secondary to endocrine disorders: Secondary | ICD-10-CM

## 2022-02-04 LAB — POCT GLYCOSYLATED HEMOGLOBIN (HGB A1C): Hemoglobin A1C: 9.1 % — AB (ref 4.0–5.6)

## 2022-02-04 MED ORDER — OZEMPIC (0.25 OR 0.5 MG/DOSE) 2 MG/1.5ML ~~LOC~~ SOPN: 0.2500 mg | PEN_INJECTOR | SUBCUTANEOUS | 1 refills | Status: AC

## 2022-02-04 NOTE — Progress Notes (Signed)
Subjective  CC:  Chief Complaint  Patient presents with   Diabetes    Pt states that she needs a new Glucometer.    Hypertension   Chronic Kidney Disease    HPI: Teresa Barnes is a 76 y.o. female who presents to the office today for follow up of diabetes and problems listed above in the chief complaint.  Diabetes follow up: Her diabetic control is reported as Worse. Eating lots of baked goods. On farxiga 10; used to be on victoza daily.  She denies exertional CP or SOB or symptomatic hypoglycemia. She denies foot sores or paresthesias. Denies sxs of hyperglycemia but does drink lots of water daily for her kidneys.  CKD: reviewed renal notes from December. No edema.  HTN: with white coat response but hasn't been checking her bps regularly.  HM: mammo is scheduled.   Wt Readings from Last 3 Encounters:  02/04/22 180 lb (81.6 kg)  10/16/21 176 lb 3.2 oz (79.9 kg)  09/21/21 175 lb (79.4 kg)    BP Readings from Last 3 Encounters:  02/04/22 (!) 147/54  10/16/21 124/76  09/21/21 (!) 142/60    Assessment  1. Type 2 diabetes mellitus with stage 4 chronic kidney disease, without long-term current use of insulin (HCC)   2. Hypertension associated with diabetes (HCC)   3. CKD (chronic kidney disease) stage 4, GFR 15-29 ml/min (HCC)      Plan  Diabetes is currently poorly controlled. Educated. Start ozempic 0.25 and continue farxiga 10. Recheck 3 months. Start home glucose monitoring. Stop all baked goods.  HTN: elevated in office today. Recommend restarting home monitoring. Continue ARB CKD on arb and [id number].   Follow up: 3 mo for cpe and dm/htn/lipid recheck. Orders Placed This Encounter  Procedures   POCT HgB A1C   Meds ordered this encounter  Medications   Semaglutide,0.25 or 0.5MG /DOS, (OZEMPIC, 0.25 OR 0.5 MG/DOSE,) 2 MG/1.5ML SOPN    Sig: Inject 0.25 mg into the skin once a week.    Dispense:  2.25 mL    Refill:  1      Immunization History  Administered Date(s)  Administered   Fluad Quad(high Dose 65+) 08/18/2019, 08/06/2021   Influenza, High Dose Seasonal PF 08/18/2017, 07/15/2018, 08/14/2020   Influenza,inj,Quad PF,6+ Mos 08/01/2016   Influenza-Unspecified 08/18/2014   Moderna Covid-19 Vaccine Bivalent Booster 85yrs & up 08/16/2021   Moderna Sars-Covid-2 Vaccination 01/01/2020, 01/29/2020, 09/25/2020   Pneumococcal Conjugate-13 12/09/2017   Pneumococcal Polysaccharide-23 09/24/2018   Zoster Recombinat (Shingrix) 10/21/2018, 01/27/2019    Diabetes Related Lab Review: Lab Results  Component Value Date   HGBA1C 9.1 (A) 02/04/2022   HGBA1C 8.2 (A) 10/16/2021   HGBA1C 6.3 (A) 08/06/2021    Lab Results  Component Value Date   MICROALBUR 8.1 (H) 03/09/2018   Lab Results  Component Value Date   CREATININE 1.56 (H) 04/12/2021   BUN 37 (H) 04/12/2021   NA 141 04/12/2021   K 4.6 04/12/2021   CL 109 04/12/2021   CO2 25 04/12/2021   Lab Results  Component Value Date   CHOL 150 04/12/2021   CHOL 171 05/03/2020   CHOL 134 08/18/2019   Lab Results  Component Value Date   HDL 57.50 04/12/2021   HDL 52.80 05/03/2020   HDL 47.70 08/18/2019   Lab Results  Component Value Date   LDLCALC 83 04/12/2021   LDLCALC 100 (H) 05/03/2020   LDLCALC 71 08/18/2019   Lab Results  Component Value Date   TRIG 49.0  04/12/2021   TRIG 91.0 05/03/2020   TRIG 78.0 08/18/2019   Lab Results  Component Value Date   CHOLHDL 3 04/12/2021   CHOLHDL 3 05/03/2020   CHOLHDL 3 08/18/2019   No results found for: LDLDIRECT The 10-year ASCVD risk score (Arnett DK, et al., 2019) is: 34%   Values used to calculate the score:     Age: 68 years     Sex: Female     Is Non-Hispanic African American: Yes     Diabetic: Yes     Tobacco smoker: No     Systolic Blood Pressure: 147 mmHg     Is BP treated: Yes     HDL Cholesterol: 57.5 mg/dL     Total Cholesterol: 150 mg/dL I have reviewed the PMH, Fam and Soc history. Patient Active Problem List   Diagnosis Date  Noted   Type 2 diabetes mellitus with stage 4 chronic kidney disease, without long-term current use of insulin (HCC) 08/09/2020    Priority: High   CKD (chronic kidney disease) stage 4, GFR 15-29 ml/min (HCC) 05/31/2020    Priority: High    Normal ultrasound, 07/2020; referred to renal 05/2020    Mixed hyperlipidemia 12/09/2017    Priority: High   Hypertension associated with diabetes (HCC) 12/09/2017    Priority: High   H/O: upper GI bleed 07/30/2016    Priority: High    Due to severe erosive esophagitis; 2017; healed with PPI by egd f/u; has HH and stricture; s/p blood transfusion. 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 Yancey Flemings; due to h/o severe recurrent erosive esophagitis.    Chronic gout 12/09/2017    Priority: Medium     On allopurinol    Glaucoma 12/09/2017    Priority: Medium    Degenerative arthritis of right knee 07/03/2017    Priority: Medium     Injected 07/03/2017' Injected August 05, 2018    Right lumbar radiculopathy 01/23/2016    Priority: Medium    Arthritis of right acromioclavicular joint 01/02/2016    Priority: Medium     Injected 01/02/2016 Repeat injection 03/12/2016    Greater trochanteric bursitis of right hip 11/17/2017    Priority: Low    Injected November 17, 2017 Repeat injection May 05, 2018    Acute bursitis of right shoulder 06/03/2019    Injected in June 03, 2019    Adhesive bursitis of left shoulder 08/05/2018    Injected August 05, 2018     Social History: Patient  reports that she quit smoking about 28 years ago. Her  smoking use included cigarettes. She has a 2.50 pack-year smoking history. She has never used smokeless tobacco. She reports that she does not drink alcohol and does not use drugs.  Review of Systems: Ophthalmic: negative for eye pain, loss of vision or double vision Cardiovascular: negative for chest pain Respiratory: negative for SOB or persistent cough Gastrointestinal: negative for abdominal pain Genitourinary: negative for dysuria or gross hematuria MSK: negative for foot lesions  Neurologic: negative for weakness or gait disturbance  Objective  Vitals: BP (!) 147/54   Pulse (!) 49   Temp 97.6 F (36.4 C) (Temporal)   Ht 5\' 4"  (1.626 m)   Wt 180 lb (81.6 kg)   SpO2 99%   BMI 30.90 kg/m  General: well appearing, no acute distress  Psych:  Alert and oriented, normal mood and affect HEENT:  Normocephalic, atraumatic, moist mucous membranes, supple neck  Cardiovascular:  Nl S1 and S2, RRR without murmur, gallop or rub. no edema Respiratory:  Good breath sounds bilaterally, CTAB with normal effort, no rales No edema    Diabetic education: ongoing education regarding chronic disease management for diabetes was given today. We continue to reinforce the ABC's of diabetic management: A1c (<7 or 8 dependent upon patient), tight blood pressure control, and cholesterol management with goal LDL < 100 minimally. We discuss diet strategies, exercise recommendations, medication options and possible side effects. At each visit, we review recommended immunizations and preventive care recommendations for diabetics and stress that good diabetic control can prevent other problems. See below for this patient's data.   Commons side effects, risks, benefits, and alternatives for medications and treatment plan prescribed today were discussed, and the patient expressed understanding of the given instructions. Patient is instructed to call or message via MyChart if he/she has any questions or concerns  regarding our treatment plan. No barriers to understanding were identified. We discussed Red Flag symptoms and signs in detail. Patient expressed understanding regarding what to do in case of urgent or emergency type symptoms.  Medication list was reconciled, printed and provided to the patient in AVS. Patient instructions and summary information was reviewed with the patient as documented in the AVS. This note was prepared with assistance of Dragon voice recognition software. Occasional wrong-word or sound-a-like substitutions may have occurred due to the inherent limitations of voice recognition software  This visit occurred during the SARS-CoV-2 public health emergency.  Safety protocols were in place, including screening questions prior to the visit, additional usage of staff PPE, and extensive cleaning of exam room while observing appropriate contact time as indicated for disinfecting solutions.

## 2022-02-04 NOTE — Patient Instructions (Addendum)
Please return in 3 months for your annual complete physical; please come fasting.  ? ?Please start the ozempic weekly injections in addition to your farxiga '10mg'$  daily.  ?No more sweets or desserts! :) ? ?If you have any questions or concerns, please don't hesitate to send me a message via MyChart or call the office at [phone]. Thank you for visiting with [place] today! It's our pleasure caring for you. 3 months  ?

## 2022-02-07 DIAGNOSIS — N184 Chronic kidney disease, stage 4 (severe): Secondary | ICD-10-CM | POA: Diagnosis not present

## 2022-02-07 DIAGNOSIS — R3129 Other microscopic hematuria: Secondary | ICD-10-CM | POA: Diagnosis not present

## 2022-02-07 DIAGNOSIS — E1122 Type 2 diabetes mellitus with diabetic chronic kidney disease: Secondary | ICD-10-CM | POA: Diagnosis not present

## 2022-02-07 DIAGNOSIS — I129 Hypertensive chronic kidney disease with stage 1 through stage 4 chronic kidney disease, or unspecified chronic kidney disease: Secondary | ICD-10-CM | POA: Diagnosis not present

## 2022-02-07 DIAGNOSIS — D638 Anemia in other chronic diseases classified elsewhere: Secondary | ICD-10-CM | POA: Diagnosis not present

## 2022-02-07 DIAGNOSIS — N2581 Secondary hyperparathyroidism of renal origin: Secondary | ICD-10-CM | POA: Diagnosis not present

## 2022-02-07 DIAGNOSIS — K2210 Ulcer of esophagus without bleeding: Secondary | ICD-10-CM | POA: Diagnosis not present

## 2022-02-28 ENCOUNTER — Ambulatory Visit: Payer: Medicare Other

## 2022-03-08 ENCOUNTER — Ambulatory Visit (INDEPENDENT_AMBULATORY_CARE_PROVIDER_SITE_OTHER): Payer: Medicare Other | Admitting: Podiatry

## 2022-03-08 ENCOUNTER — Encounter: Payer: Self-pay | Admitting: Podiatry

## 2022-03-08 DIAGNOSIS — E119 Type 2 diabetes mellitus without complications: Secondary | ICD-10-CM

## 2022-03-08 DIAGNOSIS — B351 Tinea unguium: Secondary | ICD-10-CM | POA: Diagnosis not present

## 2022-03-08 DIAGNOSIS — M79675 Pain in left toe(s): Secondary | ICD-10-CM

## 2022-03-08 DIAGNOSIS — M79674 Pain in right toe(s): Secondary | ICD-10-CM | POA: Diagnosis not present

## 2022-03-10 ENCOUNTER — Other Ambulatory Visit: Payer: Self-pay | Admitting: Family Medicine

## 2022-03-11 DIAGNOSIS — N184 Chronic kidney disease, stage 4 (severe): Secondary | ICD-10-CM | POA: Diagnosis not present

## 2022-03-13 NOTE — Progress Notes (Deleted)
[place] [address] [place] Phone: [phone] Subjective:    I'm seeing this patient by the request  of:  Leamon Arnt, MD  CC:   [id number]  Last seen 2020  ALICIANNA LITCHFORD is a 76 y.o. female coming in with complaint of sciatic pain?  Onset-  Location Duration-  Character- Aggravating factors- Reliving factors-  Therapies tried-  Severity-     Past Medical History:  Diagnosis Date   Anemia    Anemia of chronic disease 09/24/2018   Anxiety    Blood transfusion without reported diagnosis 07/2016   Cancer ([place]) 1991   breast   Chronic gout 12/09/2017   On allopurinol   Diabetes mellitus without complication ([place])    Esophageal stricture 12/09/2017   S/p dilation by GI: Dr. Scarlette Shorts; due to h/o severe recurrent erosive esophagitis.   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   Injected left foot every 19 2018   GERD (gastroesophageal reflux disease)    Glaucoma    Hiatal hernia 12/09/2017   Hyperlipidemia    Hypertension    Right rotator cuff tear 11/24/2015   Injected this and 26 2017   Wears glasses    Past Surgical History:  Procedure Laterality Date   ANTERIOR CERVICAL DECOMP/DISCECTOMY FUSION N/A 03/26/2019   Procedure: Anterior Cervical Discectomy Fusion - Cervical Three-Cervical Four - Cervical Four-Cervical Five - Cervical Five-Cervical Six;  Surgeon: Eustace Moore, MD;  Location: [facility];  Service: Neurosurgery;  Laterality: N/A;  Anterior Cervical Discectomy Fusion - Cervical Three-Cervical Four - Cervical Four-Cervical Five - Cervical Five-Cervical Six   CATARACT EXTRACTION W/ INTRAOCULAR LENS  IMPLANT, BILATERAL  2000   COLONOSCOPY     cyst on spine     ESOPHAGOGASTRODUODENOSCOPY (EGD) WITH PROPOFOL N/A 07/31/2016   Procedure: ESOPHAGOGASTRODUODENOSCOPY (EGD) WITH PROPOFOL;  Surgeon: Manus Gunning, MD;  Location: [facility];  Service: Gastroenterology;  Laterality: N/A;   EYE SURGERY  2011    lt eye blled   MASTECTOMY MODIFIED RADICAL Right 1990   REDUCTION MAMMAPLASTY Left    TRIGGER FINGER RELEASE Right 06/24/2013   Procedure: RELEASE TRIGGER FINGER/A-1 PULLEY PIP RIGHT LONG FINGER;  Surgeon: Cammie Sickle., MD;  Location: [facility];  Service: Orthopedics;  Laterality: Right;   TRIGGER FINGER RELEASE     TUBAL LIGATION     Social History   Socioeconomic History   Marital status: Married    Spouse name: robert   Number of children: 2   Years of education: Not on file   Highest education level: Not on file  Occupational History   Occupation: retired Educational psychologist; [place]: 2006  Tobacco Use   Smoking status: Former    Packs/day: 0.25    Years: 10.00    Pack years: 2.50    Types: Cigarettes    Quit date: 06/18/1993    Years since quitting: 28.7   Smokeless tobacco: Never  Vaping Use   Vaping Use: Never used  Substance and Sexual Activity   Alcohol use: No   Drug use: No   Sexual activity: Never  Other Topics Concern   Not on file  Social History Narrative   Originally from [place], [place]; worked in [employer] for [employer]; longevity in family   Social Determinants of Radio broadcast assistant Strain: Low Risk    Difficulty of Paying Living Expenses: Not hard at [place]: No [place]  Worried About Charity fundraiser in the Last Year: Never true   [place] in the Last Year: Never true  Transportation Needs: No Transportation Needs   Lack of Transportation (Medical): No   Lack of Transportation (Non-Medical): No  Physical Activity: Inactive   Days of Exercise per Week: 0 days   Minutes of Exercise per Session: 0 min  Stress: No Stress Concern Present   Feeling of Stress : Not at all  Social Connections: Moderately Integrated   Frequency of Communication with Friends and Family: More than three times a week   Frequency of Social Gatherings with Friends and Family: More than three times a week    Attends Religious Services: More than 4 times per year   Active Member of [employer] or Organizations: No   Attends Music therapist: Never   Marital Status: Married   Allergies  Allergen Reactions   Prednisone Nausea And Vomiting   Tramadol Nausea Only   Family History  Problem Relation Age of Onset   Diabetes Mother    Diabetes Daughter    Arthritis Daughter    Hyperlipidemia Daughter    Diabetes Son    Arthritis Son    Diabetes Sister    Colon cancer Neg Hx    Cancer Neg Hx    Heart disease Neg Hx    Breast cancer Neg Hx    Stomach cancer Neg Hx    Pancreatic cancer Neg Hx    Esophageal cancer Neg Hx    Liver disease Neg Hx     Current Outpatient Medications (Endocrine & Metabolic):    dapagliflozin propanediol (FARXIGA) 10 MG TABS tablet, Take 1 tablet (10 mg total) by mouth daily.   Semaglutide,0.25 or 0.'5MG'$ /DOS, (OZEMPIC, 0.25 OR 0.5 MG/DOSE,) 2 MG/1.5ML SOPN, Inject 0.25 mg into the skin once a week.  Current Outpatient Medications (Cardiovascular):    losartan (COZAAR) 50 MG tablet, Take 50 mg by mouth daily.   rosuvastatin (CRESTOR) 20 MG tablet, Take 1 tablet by mouth once daily    Current Outpatient Medications (Hematological):    ferrous sulfate 325 (65 FE) MG tablet, Take 325 mg by mouth daily with breakfast.   vitamin B-12 (CYANOCOBALAMIN) 500 MCG tablet, Take 500 mcg by mouth every other day.  Current Outpatient Medications (Other):    Cholecalciferol (VITAMIN D3 PO), Take 1,000 mg by mouth daily.   dorzolamide-timolol (COSOPT) 22.3-6.8 MG/ML ophthalmic solution, Place 1 drop into both eyes 2 (two) times daily.   latanoprost (XALATAN) 0.005 % ophthalmic solution, Place 1 drop into both eyes at bedtime.   omeprazole (PRILOSEC) 20 MG capsule, Take 20 mg by mouth daily.   sodium bicarbonate 650 MG tablet, Take 650 mg by mouth 2 (two) times daily.   Reviewed prior external information including notes and imaging from  primary care provider As  well as notes that were available from care everywhere and other healthcare systems.  Past medical history, social, surgical and family history all reviewed in electronic medical record.  No pertanent information unless stated regarding to the chief complaint.   Review of Systems:  No headache, visual changes, nausea, vomiting, diarrhea, constipation, dizziness, abdominal pain, skin rash, fevers, chills, night sweats, weight loss, swollen lymph nodes, body aches, joint swelling, chest pain, shortness of breath, mood changes. POSITIVE muscle aches  Objective  There were no vitals taken for this visit.   General: No apparent distress alert and oriented x3 mood and affect normal, dressed appropriately.  HEENT:  Pupils equal, extraocular movements intact  Respiratory: Patient's speak in full sentences and does not appear short of breath  Cardiovascular: No lower extremity edema, non tender, no erythema  Gait normal with good balance and coordination.  MSK:  Non tender with full range of motion and good stability and symmetric strength and tone of shoulders, elbows, wrist, hip, knee and ankles bilaterally.     Impression and Recommendations:     The above documentation has been reviewed and is accurate and complete Delsa Sale

## 2022-03-14 ENCOUNTER — Ambulatory Visit: Payer: Medicare Other | Admitting: Family Medicine

## 2022-03-17 NOTE — Progress Notes (Signed)
?  Subjective:  ?Patient ID: Teresa Barnes, female    DOB: March 09, 1946,  MRN: [id number] ? ?ARIANNE KLINGE presents to clinic today for preventative diabetic foot care and painful thick toenails that are difficult to trim. Pain interferes with ambulation. Aggravating factors include wearing enclosed shoe gear. Pain is relieved with periodic professional debridement. ? ?Patient did not check blood glucose today. ? ?New problem(s): None.  ? ?PCP is Leamon Arnt, MD , and last visit was February 04, 2022. ? ?Allergies  ?Allergen Reactions  ? Prednisone Nausea And Vomiting  ? Tramadol Nausea Only  ? ? ?Review of Systems: Negative except as noted in the HPI. ? ?Objective: No changes noted in today's physical examination. ? ?Constitutional Teresa Barnes is a pleasant 76 y.o. African American female, WD, WN in NAD. [name] x 3.   ?Vascular CFT <3 seconds b/l LE. Faintly palpable DP pulses b/l LE. Faintly palpable PT pulse(s) b/l LE. Pedal hair absent. No pain with calf compression b/l. Lower extremity skin temperature gradient within normal limits. No edema noted b/l LE. No ischemia or gangrene noted b/l LE. No cyanosis or clubbing noted b/l LE.  ?Neurologic Normal speech. Oriented to person, place, and time. Protective sensation intact 5/5 intact bilaterally with 10g monofilament b/l.  ?Dermatologic Pedal skin is warm and supple b/l LE. No open wounds b/l LE. No interdigital macerations noted b/l LE. Toenails 2-5 bilaterally and R hallux elongated, discolored, dystrophic, thickened, and crumbly with subungual debris and tenderness to dorsal palpation. Incurvated nailplate lateral border(s) L hallux.  Nail border hypertrophy minimal. There is tenderness to palpation. Sign(s) of infection: no clinical signs of infection noted on examination today. No hyperkeratotic nor porokeratotic lesions present on today's visit.  ?Orthopedic: Normal muscle strength 5/5 to all lower extremity muscle groups bilaterally. No pain, crepitus or  joint limitation noted with ROM b/l LE. No gross bony pedal deformities b/l. Patient ambulates independently without assistive aids.  ? ?Radiographs: None ? ?  Latest Ref Rng & Units 02/04/2022  ?  1:19 PM 10/16/2021  ? 11:14 AM 08/06/2021  ?  1:45 PM 04/12/2021  ? 12:02 PM  ?Hemoglobin A1C  ?Hemoglobin-A1c 4.0 - 5.6 % 9.1   8.2   6.3   6.4    ? ?Assessment/Plan: ?1. Pain due to onychomycosis of toenails of both feet   ?2. Diabetes mellitus without complication ([place])   ?  ?-Examined patient. ?-Toenails 2-5 bilaterally and right great toe debrided in length and girth without iatrogenic bleeding with sterile nail nipper and dremel.  ?-Offending nail border debrided and curretaged left great toe utilizing sterile nail nipper and currette. Border cleansed with alcohol and triple antibiotic applied. No further treatment required by patient/caregiver. ?-Patient/POA to call should there be question/concern in the interim.  ? ?Return in about 3 months (around 06/07/2022). ? ?Marzetta Board, DPM  ?

## 2022-04-08 ENCOUNTER — Ambulatory Visit: Payer: Medicare Other | Admitting: Family Medicine

## 2022-04-17 ENCOUNTER — Ambulatory Visit: Payer: Medicare Other | Admitting: Family Medicine

## 2022-04-19 DIAGNOSIS — N189 Chronic kidney disease, unspecified: Secondary | ICD-10-CM | POA: Diagnosis not present

## 2022-04-19 DIAGNOSIS — N184 Chronic kidney disease, stage 4 (severe): Secondary | ICD-10-CM | POA: Diagnosis not present

## 2022-04-22 DIAGNOSIS — N184 Chronic kidney disease, stage 4 (severe): Secondary | ICD-10-CM | POA: Diagnosis not present

## 2022-04-22 DIAGNOSIS — D638 Anemia in other chronic diseases classified elsewhere: Secondary | ICD-10-CM | POA: Diagnosis not present

## 2022-04-22 DIAGNOSIS — E1122 Type 2 diabetes mellitus with diabetic chronic kidney disease: Secondary | ICD-10-CM | POA: Diagnosis not present

## 2022-04-22 DIAGNOSIS — K2210 Ulcer of esophagus without bleeding: Secondary | ICD-10-CM | POA: Diagnosis not present

## 2022-04-22 DIAGNOSIS — R3129 Other microscopic hematuria: Secondary | ICD-10-CM | POA: Diagnosis not present

## 2022-04-22 DIAGNOSIS — I129 Hypertensive chronic kidney disease with stage 1 through stage 4 chronic kidney disease, or unspecified chronic kidney disease: Secondary | ICD-10-CM | POA: Diagnosis not present

## 2022-04-22 DIAGNOSIS — N2581 Secondary hyperparathyroidism of renal origin: Secondary | ICD-10-CM | POA: Diagnosis not present

## 2022-04-29 ENCOUNTER — Ambulatory Visit: Payer: Medicare Other

## 2022-05-10 ENCOUNTER — Other Ambulatory Visit (HOSPITAL_COMMUNITY): Payer: Self-pay | Admitting: *Deleted

## 2022-05-13 ENCOUNTER — Encounter (HOSPITAL_COMMUNITY): Payer: Medicare Other

## 2022-05-13 ENCOUNTER — Ambulatory Visit (HOSPITAL_COMMUNITY)
Admission: RE | Admit: 2022-05-13 | Discharge: 2022-05-13 | Disposition: A | Discharge status: Home / Self Care | Payer: Medicare Other | Source: Ambulatory Visit | Attending: Nephrology | Admitting: Nephrology

## 2022-05-13 VITALS — BP 123/52 | HR 52 | Temp 98.2°F | Resp 16

## 2022-05-13 DIAGNOSIS — N184 Chronic kidney disease, stage 4 (severe): Secondary | ICD-10-CM | POA: Insufficient documentation

## 2022-05-13 LAB — POCT HEMOGLOBIN-HEMACUE: Hemoglobin: 8.4 g/dL — ABNORMAL LOW (ref 12.0–15.0)

## 2022-05-13 MED ORDER — EPOETIN ALFA-EPBX 10000 UNIT/ML IJ SOLN: 10000.0000 [IU] | INTRAMUSCULAR | Status: DC

## 2022-05-13 MED ORDER — SODIUM CHLORIDE 0.9 % IV SOLN
510.0000 mg | Freq: Once | INTRAVENOUS | Status: AC
  Administered 2022-05-13: 510 mg via INTRAVENOUS
  Filled 2022-05-13: qty 17

## 2022-05-13 MED ORDER — EPOETIN ALFA-EPBX 10000 UNIT/ML IJ SOLN
INTRAMUSCULAR | Status: AC
  Administered 2022-05-13: 10000 [IU] via SUBCUTANEOUS
  Filled 2022-05-13: qty 1

## 2022-05-15 ENCOUNTER — Ambulatory Visit (INDEPENDENT_AMBULATORY_CARE_PROVIDER_SITE_OTHER): Payer: Medicare Other | Admitting: Family Medicine

## 2022-05-15 VITALS — BP 150/78 | HR 95 | Temp 98.3°F | Ht 64.0 in | Wt 181.8 lb

## 2022-05-15 DIAGNOSIS — D638 Anemia in other chronic diseases classified elsewhere: Secondary | ICD-10-CM | POA: Diagnosis not present

## 2022-05-15 DIAGNOSIS — I152 Hypertension secondary to endocrine disorders: Secondary | ICD-10-CM | POA: Diagnosis not present

## 2022-05-15 DIAGNOSIS — I10 Essential (primary) hypertension: Secondary | ICD-10-CM

## 2022-05-15 DIAGNOSIS — E782 Mixed hyperlipidemia: Secondary | ICD-10-CM

## 2022-05-15 DIAGNOSIS — E1122 Type 2 diabetes mellitus with diabetic chronic kidney disease: Secondary | ICD-10-CM

## 2022-05-15 DIAGNOSIS — E1159 Type 2 diabetes mellitus with other circulatory complications: Secondary | ICD-10-CM

## 2022-05-15 DIAGNOSIS — N184 Chronic kidney disease, stage 4 (severe): Secondary | ICD-10-CM

## 2022-05-15 LAB — MICROALBUMIN / CREATININE URINE RATIO
Creatinine,U: 34.2 mg/dL
Microalb Creat Ratio: 14.5 mg/g (ref 0.0–30.0)
Microalb, Ur: 4.9 mg/dL — ABNORMAL HIGH (ref 0.0–1.9)

## 2022-05-15 LAB — POCT GLYCOSYLATED HEMOGLOBIN (HGB A1C): Hemoglobin A1C: 6.4 % — AB (ref 4.0–5.6)

## 2022-05-15 MED ORDER — OZEMPIC (0.25 OR 0.5 MG/DOSE) 2 MG/3ML ~~LOC~~ SOPN
0.5000 mg | PEN_INJECTOR | SUBCUTANEOUS | 2 refills | Status: DC
Start: 2022-05-15 — End: 2022-08-15

## 2022-05-15 MED ORDER — HYDROCHLOROTHIAZIDE 25 MG PO TABS: 25.0000 mg | ORAL_TABLET | Freq: Every day | ORAL | 3 refills | Status: DC | PRN

## 2022-05-15 NOTE — Patient Instructions (Signed)
Please return in 3 months for diabetes follow up   If you have any questions or concerns, please don't hesitate to send me a message via MyChart or call the office at [phone]. Thank you for visiting with us today! It's our pleasure caring for you.  

## 2022-05-15 NOTE — Progress Notes (Signed)
Subjective  CC:  Chief Complaint  Patient presents with   Annual Exam    Pt here fo annual Exam and is not currently fasting    HPI: Teresa Barnes is a 76 y.o. female who presents to the office today for follow up of diabetes and problems listed above in the chief complaint.  Diabetes follow up: now on farxiga 10 and ozempic 0.25 weekly and doing much better. Has stopped eating baked goods as well.  Her diabetic control is reported as Improved. NO AE from ozempic. She denies exertional CP or SOB or symptomatic hypoglycemia. She denies foot sores or paresthesias. Eye exam due in september CKD: per renal doc, improving!  On farxiga. Anemia CKD now on retacrit and had iron infusion. C/o leg edema wearing compression stockings. Weight is up HTN: hasn't checked home readings but low reading two days ago at retacrit visit. Feels fine. On losartan 50 HLD On rosuvastatin 20 and tolerates well. Non fasting  HM: mammo scheduled. Imms utd  Wt Readings from Last 3 Encounters:  05/15/22 181 lb 12.8 oz (82.5 kg)  02/04/22 180 lb (81.6 kg)  10/16/21 176 lb 3.2 oz (79.9 kg)    BP Readings from Last 3 Encounters:  05/15/22 (!) 150/78  05/13/22 (!) 123/52  02/04/22 (!) 147/54     Assessment  1. Type 2 diabetes mellitus with stage 4 chronic kidney disease, without long-term current use of insulin ([facility])   2. Need for shingles vaccine   3. Hypertension associated with diabetes ([place])   4. Mixed hyperlipidemia   5. CKD (chronic kidney disease) stage 4, GFR 15-29 ml/min ([facility])   6. Anemia of chronic disease      Plan  Diabetes is currently very well controlled. Ozempic increase to 0.38m weekly. Continue farxiga 10. Continue healthy diet. Pt to schedule eye exam.  Recheck lipids, renal and lytes. Following CKD with renal.  Bp_fair control. Add hctz for edema as needed and start home monitoring.  HLD on crestor 20. Recheck today.  Monitoring iron and anemia. On retacrit monthly now.   Follow up:  3 mo for dm and htn. Orders Placed This Encounter  Procedures   CBC with Differential/Platelet   Comp Met (CMET)   Lipid Profile   POCT glycosylated hemoglobin (Hb A1C)   No orders of the defined types were placed in this encounter.     Immunization History  Administered Date(s) Administered   Fluad Quad(high Dose 65+) 08/18/2019, 08/06/2021   Influenza, High Dose Seasonal PF 08/18/2017, 07/15/2018, 08/14/2020   Influenza,inj,Quad PF,6+ Mos 08/01/2016   Influenza-Unspecified 08/18/2014   Moderna Covid-19 Vaccine Bivalent Booster 181yr& up 08/16/2021   Moderna Sars-Covid-2 Vaccination 01/01/2020, 01/29/2020, 09/25/2020   Pneumococcal Conjugate-13 12/09/2017   Pneumococcal Polysaccharide-23 09/24/2018   Zoster Recombinat (Shingrix) 10/21/2018, 01/27/2019    Diabetes Related Lab Review: Lab Results  Component Value Date   HGBA1C 9.1 (A) 02/04/2022   HGBA1C 8.2 (A) 10/16/2021   HGBA1C 6.3 (A) 08/06/2021    Lab Results  Component Value Date   MICROALBUR 8.1 (H) 03/09/2018   Lab Results  Component Value Date   CREATININE 1.56 (H) 04/12/2021   BUN 37 (H) 04/12/2021   NA 141 04/12/2021   K 4.6 04/12/2021   CL 109 04/12/2021   CO2 25 04/12/2021   Lab Results  Component Value Date   CHOL 150 04/12/2021   CHOL 171 05/03/2020   CHOL 134 08/18/2019   Lab Results  Component Value Date  HDL 57.50 04/12/2021   HDL 52.80 05/03/2020   HDL 47.70 08/18/2019   Lab Results  Component Value Date   LDLCALC 83 04/12/2021   LDLCALC 100 (H) 05/03/2020   LDLCALC 71 08/18/2019   Lab Results  Component Value Date   TRIG 49.0 04/12/2021   TRIG 91.0 05/03/2020   TRIG 78.0 08/18/2019   Lab Results  Component Value Date   CHOLHDL 3 04/12/2021   CHOLHDL 3 05/03/2020   CHOLHDL 3 08/18/2019   No results found for: "LDLDIRECT" The 10-year ASCVD risk score (Arnett DK, et al., 2019) is: 34.7%   Values used to calculate the score:     Age: 13 years     Sex: Female     Is  Non-Hispanic African American: Yes     Diabetic: Yes     Tobacco smoker: No     Systolic Blood Pressure: 654 mmHg     Is BP treated: Yes     HDL Cholesterol: 57.5 mg/dL     Total Cholesterol: 150 mg/dL I have reviewed the [place], Fam and Soc history. Patient Active Problem List   Diagnosis Date Noted   Type 2 diabetes mellitus with stage 4 chronic kidney disease, without long-term current use of insulin ([place]) 08/09/2020    Priority: High   CKD (chronic kidney disease) stage 4, GFR 15-29 ml/min ([place]) 05/31/2020    Priority: High    Normal ultrasound, 07/2020; referred to renal 05/2020    Mixed hyperlipidemia 12/09/2017    Priority: High   Hypertension associated with diabetes ([place]) 12/09/2017    Priority: High   H/O: upper GI bleed 07/30/2016    Priority: High    Due to severe erosive esophagitis; 2017; healed with PPI by egd f/u; has Munjor and stricture; s/p blood transfusion. 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 Scarlette Shorts; due to h/o severe recurrent erosive esophagitis.    Chronic gout 12/09/2017    Priority: Medium     On allopurinol    Glaucoma 12/09/2017    Priority: Medium    Degenerative arthritis of right knee 07/03/2017    Priority: Medium     Injected 07/03/2017' Injected August 05, 2018    Right lumbar radiculopathy 01/23/2016    Priority: Medium    Arthritis of right acromioclavicular joint 01/02/2016    Priority: Medium     Injected 01/02/2016 Repeat injection 03/12/2016    Greater trochanteric bursitis of right hip 11/17/2017    Priority: Low    Injected  November 17, 2017 Repeat injection May 05, 2018    Acute bursitis of right shoulder 06/03/2019    Injected in June 03, 2019    Adhesive bursitis of left shoulder 08/05/2018    Injected August 05, 2018     Social History: Patient  reports that she quit smoking about 28 years ago. Her smoking use included cigarettes. She has a 2.50 pack-year smoking history. She has never used smokeless tobacco. She reports that she does not drink alcohol  and does not use drugs.  Review of Systems: Ophthalmic: negative for eye pain, loss of vision or double vision Cardiovascular: negative for chest pain Respiratory: negative for SOB or persistent cough Gastrointestinal: negative for abdominal pain Genitourinary: negative for dysuria or gross hematuria MSK: negative for foot lesions Neurologic: negative for weakness or gait disturbance  Objective  Vitals: BP (!) 150/78   Pulse 95   Temp 98.3 F (36.8 C)   Ht '5\' 4"'  (1.626 m)   Wt 181 lb 12.8 oz (82.5 kg)   SpO2 96%   BMI 31.21 kg/m  General: well appearing, no acute distress  Psych:  Alert and oriented, normal mood and affect HEENT:  Normocephalic, atraumatic, moist mucous membranes, supple neck  Cardiovascular:  Nl S1 and S2, RRR without murmur, gallop or rub. no edema Respiratory:  Good breath sounds bilaterally, CTAB with normal effort, no rales Bilateral edema Foot exam: no erythema, pallor, or cyanosis visible nl proprioception and sensation to monofilament testing bilaterally, +2 distal pulses bilaterally    Diabetic education: ongoing education regarding chronic disease management for diabetes was given today. We continue to reinforce the ABC's of diabetic management: A1c (<7 or 8 dependent upon patient), tight blood pressure control, and cholesterol management with goal LDL < 100 minimally. We discuss diet strategies, exercise recommendations, medication options and possible side effects. At each visit, we review recommended  immunizations and preventive care recommendations for diabetics and stress that good diabetic control can prevent other problems. See below for this patient's data.   Commons side effects, risks, benefits, and alternatives for medications and treatment plan prescribed today were discussed, and the patient expressed understanding of the given instructions. Patient is instructed to call or message via MyChart if he/she has any questions or concerns regarding our treatment plan. No barriers to understanding were identified. We discussed Red Flag symptoms and signs in detail. Patient expressed understanding regarding what to do in case of urgent or emergency type symptoms.  Medication list was reconciled, printed and provided to the patient in AVS. Patient instructions and summary information was reviewed with the patient as documented in the AVS. This note was prepared with assistance of Dragon voice recognition software. Occasional wrong-word or sound-a-like substitutions may have occurred due to the inherent limitations of voice recognition software  This visit occurred during the SARS-CoV-2 public health emergency.  Safety protocols were in place, including screening questions prior to the visit, additional usage of staff PPE, and extensive cleaning of exam room while observing appropriate contact time as indicated for disinfecting solutions.

## 2022-05-16 LAB — COMPREHENSIVE METABOLIC PANEL
ALT: 8 U/L (ref 0–35)
AST: 15 U/L (ref 0–37)
Albumin: 4.3 g/dL (ref 3.5–5.2)
Alkaline Phosphatase: 47 U/L (ref 39–117)
BUN: 39 mg/dL — ABNORMAL HIGH (ref 6–23)
CO2: 24 mEq/L (ref 19–32)
Calcium: 10 mg/dL (ref 8.4–10.5)
Chloride: 105 mEq/L (ref 96–112)
Creatinine, Ser: 1.96 mg/dL — ABNORMAL HIGH (ref 0.40–1.20)
GFR: 24.2 mL/min — ABNORMAL LOW (ref >60.00)
Glucose, Bld: 116 mg/dL — ABNORMAL HIGH (ref 70–99)
Potassium: 4.7 mEq/L (ref 3.5–5.1)
Sodium: 137 mEq/L (ref 135–145)
Total Bilirubin: 0.6 mg/dL (ref 0.2–1.2)
Total Protein: 7.3 g/dL (ref 6.0–8.3)

## 2022-05-16 LAB — CBC WITH DIFFERENTIAL/PLATELET
Basophils Absolute: 0.1 10*3/uL (ref 0.0–0.1)
Basophils Relative: 1.3 % (ref 0.0–3.0)
Eosinophils Absolute: 0.3 10*3/uL (ref 0.0–0.7)
Eosinophils Relative: 5.2 % — ABNORMAL HIGH (ref 0.0–5.0)
HCT: 28.6 % — ABNORMAL LOW (ref 36.0–46.0)
Hemoglobin: 9.4 g/dL — ABNORMAL LOW (ref 12.0–15.0)
Lymphocytes Relative: 31.1 % (ref 12.0–46.0)
Lymphs Abs: 2 10*3/uL (ref 0.7–4.0)
MCHC: 33 g/dL (ref 30.0–36.0)
MCV: 92.7 fl (ref 78.0–100.0)
Monocytes Absolute: 0.8 10*3/uL (ref 0.1–1.0)
Monocytes Relative: 13 % — ABNORMAL HIGH (ref 3.0–12.0)
Neutro Abs: 3.2 10*3/uL (ref 1.4–7.7)
Neutrophils Relative %: 49.4 % (ref 43.0–77.0)
Platelets: 285 10*3/uL (ref 150.0–400.0)
RBC: 3.09 Mil/uL — ABNORMAL LOW (ref 3.87–5.11)
RDW: 14.3 % (ref 11.5–15.5)
WBC: 6.5 10*3/uL (ref 4.0–10.5)

## 2022-05-16 LAB — LIPID PANEL
Cholesterol: 143 mg/dL (ref 0–200)
HDL: 60.1 mg/dL (ref >39.00)
LDL Cholesterol: 71 mg/dL (ref 0–99)
NonHDL: 83.06
Total CHOL/HDL Ratio: 2
Triglycerides: 61 mg/dL (ref 0.0–149.0)
VLDL: 12.2 mg/dL (ref 0.0–40.0)

## 2022-05-17 ENCOUNTER — Encounter: Payer: Self-pay | Admitting: Podiatry

## 2022-05-17 ENCOUNTER — Ambulatory Visit: Payer: Medicare Other | Admitting: Podiatry

## 2022-05-17 DIAGNOSIS — B351 Tinea unguium: Secondary | ICD-10-CM

## 2022-05-17 DIAGNOSIS — M79674 Pain in right toe(s): Secondary | ICD-10-CM

## 2022-05-17 DIAGNOSIS — E119 Type 2 diabetes mellitus without complications: Secondary | ICD-10-CM

## 2022-05-17 DIAGNOSIS — M79675 Pain in left toe(s): Secondary | ICD-10-CM | POA: Diagnosis not present

## 2022-05-17 NOTE — Progress Notes (Signed)
Please call patient: I have reviewed his/her lab results.  Lab results are stable.  We will see how she tolerates the hydrochlorothiazide for her swelling.  No other medication changes are needed at this time.

## 2022-05-27 ENCOUNTER — Encounter (HOSPITAL_COMMUNITY)
Admission: RE | Admit: 2022-05-27 | Discharge: 2022-05-27 | Disposition: A | Discharge status: Home / Self Care | Payer: Medicare Other | Source: Ambulatory Visit | Attending: Nephrology | Admitting: Nephrology

## 2022-05-27 VITALS — BP 106/42 | HR 55 | Resp 16

## 2022-05-27 DIAGNOSIS — N184 Chronic kidney disease, stage 4 (severe): Secondary | ICD-10-CM | POA: Insufficient documentation

## 2022-05-27 LAB — POCT HEMOGLOBIN-HEMACUE: Hemoglobin: 10.2 g/dL — ABNORMAL LOW (ref 12.0–15.0)

## 2022-05-27 MED ORDER — EPOETIN ALFA 10000 UNIT/ML IJ SOLN
10000.0000 [IU] | Freq: Once | INTRAMUSCULAR | Status: AC
  Administered 2022-05-27: 10000 [IU] via SUBCUTANEOUS

## 2022-05-27 MED ORDER — EPOETIN ALFA 10000 UNIT/ML IJ SOLN
INTRAMUSCULAR | Status: AC
  Filled 2022-05-27: qty 1

## 2022-05-27 MED ORDER — EPOETIN ALFA-EPBX 10000 UNIT/ML IJ SOLN: 10000.0000 [IU] | INTRAMUSCULAR | Status: DC

## 2022-06-10 ENCOUNTER — Encounter (HOSPITAL_COMMUNITY)
Admission: RE | Admit: 2022-06-10 | Discharge: 2022-06-10 | Disposition: A | Discharge status: Home / Self Care | Payer: Medicare Other | Source: Ambulatory Visit | Attending: Nephrology | Admitting: Nephrology

## 2022-06-10 VITALS — BP 110/43 | HR 54 | Resp 16

## 2022-06-10 DIAGNOSIS — N184 Chronic kidney disease, stage 4 (severe): Secondary | ICD-10-CM

## 2022-06-10 LAB — IRON AND TIBC
Iron: 51 ug/dL (ref 28–170)
Saturation Ratios: 17 % (ref 10.4–31.8)
TIBC: 297 ug/dL (ref 250–450)
UIBC: 246 ug/dL

## 2022-06-10 LAB — FERRITIN: Ferritin: 97 ng/mL (ref 11–307)

## 2022-06-10 LAB — POCT HEMOGLOBIN-HEMACUE: Hemoglobin: 10.1 g/dL — ABNORMAL LOW (ref 12.0–15.0)

## 2022-06-10 MED ORDER — EPOETIN ALFA 10000 UNIT/ML IJ SOLN
INTRAMUSCULAR | Status: AC
  Filled 2022-06-10: qty 1

## 2022-06-10 MED ORDER — EPOETIN ALFA 10000 UNIT/ML IJ SOLN
10000.0000 [IU] | Freq: Once | INTRAMUSCULAR | Status: AC
  Administered 2022-06-10: 10000 [IU] via SUBCUTANEOUS

## 2022-06-10 MED ORDER — EPOETIN ALFA-EPBX 10000 UNIT/ML IJ SOLN: 10000.0000 [IU] | INTRAMUSCULAR | Status: DC

## 2022-06-11 ENCOUNTER — Ambulatory Visit: Payer: Medicare Other | Admitting: Podiatry

## 2022-06-15 ENCOUNTER — Other Ambulatory Visit: Payer: Self-pay | Admitting: Family Medicine

## 2022-06-24 ENCOUNTER — Encounter (HOSPITAL_COMMUNITY)
Admission: RE | Admit: 2022-06-24 | Discharge: 2022-06-24 | Disposition: A | Discharge status: Home / Self Care | Payer: Medicare Other | Source: Ambulatory Visit | Attending: Nephrology | Admitting: Nephrology

## 2022-06-24 VITALS — BP 130/47 | HR 55 | Resp 16

## 2022-06-24 DIAGNOSIS — N184 Chronic kidney disease, stage 4 (severe): Secondary | ICD-10-CM | POA: Diagnosis not present

## 2022-06-24 LAB — POCT HEMOGLOBIN-HEMACUE: Hemoglobin: 11 g/dL — ABNORMAL LOW (ref 12.0–15.0)

## 2022-06-24 MED ORDER — EPOETIN ALFA-EPBX 10000 UNIT/ML IJ SOLN: 10000.0000 [IU] | INTRAMUSCULAR | Status: DC

## 2022-06-24 MED ORDER — EPOETIN ALFA 10000 UNIT/ML IJ SOLN: 10000.0000 [IU] | Freq: Once | INTRAMUSCULAR | Status: AC

## 2022-06-24 MED ORDER — EPOETIN ALFA 10000 UNIT/ML IJ SOLN
INTRAMUSCULAR | Status: AC
  Administered 2022-06-24: 10000 [IU] via SUBCUTANEOUS
  Filled 2022-06-24: qty 1

## 2022-07-05 ENCOUNTER — Other Ambulatory Visit (HOSPITAL_COMMUNITY): Payer: Self-pay | Admitting: *Deleted

## 2022-07-08 ENCOUNTER — Encounter (HOSPITAL_COMMUNITY)
Admission: RE | Admit: 2022-07-08 | Discharge: 2022-07-08 | Disposition: A | Discharge status: Home / Self Care | Payer: Medicare Other | Source: Ambulatory Visit | Attending: Nephrology | Admitting: Nephrology

## 2022-07-08 ENCOUNTER — Encounter (HOSPITAL_COMMUNITY): Payer: Medicare Other

## 2022-07-08 VITALS — BP 131/45 | HR 42 | Temp 96.7°F | Resp 17

## 2022-07-08 DIAGNOSIS — N184 Chronic kidney disease, stage 4 (severe): Secondary | ICD-10-CM

## 2022-07-08 MED ORDER — EPOETIN ALFA-EPBX 10000 UNIT/ML IJ SOLN: 10000.0000 [IU] | INTRAMUSCULAR | Status: DC

## 2022-07-08 MED ORDER — EPOETIN ALFA 10000 UNIT/ML IJ SOLN: 10000.0000 [IU] | Freq: Once | INTRAMUSCULAR | Status: DC

## 2022-07-08 MED ORDER — SODIUM CHLORIDE 0.9 % IV SOLN
510.0000 mg | Freq: Once | INTRAVENOUS | Status: AC
  Administered 2022-07-08: 510 mg via INTRAVENOUS
  Filled 2022-07-08: qty 510

## 2022-07-15 DIAGNOSIS — H40013 Open angle with borderline findings, low risk, bilateral: Secondary | ICD-10-CM | POA: Diagnosis not present

## 2022-07-15 LAB — HM DIABETES EYE EXAM

## 2022-07-18 NOTE — Progress Notes (Signed)
  Subjective:  Patient ID: KEIDY THURGOOD, female    DOB: 28-Oct-1946,  MRN: [id number]  TANA TREFRY presents to clinic today for at risk foot care. Pt has h/o NIDDM with chronic kidney disease and painful thick toenails that are difficult to trim. Pain interferes with ambulation. Aggravating factors include wearing enclosed shoe gear. Pain is relieved with periodic professional debridement.  New problem(s): None.   PCP is Leamon Arnt, MD , and last visit was  May 15, 2022  Allergies  Allergen Reactions   Prednisone Nausea And Vomiting   Tramadol Nausea Only    Review of Systems: Negative except as noted in the HPI.  Objective: No changes noted in today's physical examination. ASENCION GUISINGER is a pleasant 76 y.o.  female in NAD. [name] x 3.  Vascular Examination: CFT <3 seconds b/l. DP/PT pulses faintly palpable b/l. Skin temperature gradient warm to warm b/l. No pain with calf compression. No ischemia or gangrene. No cyanosis or clubbing noted b/l.    Neurological Examination: Sensation grossly intact b/l with 10 gram monofilament. Vibratory sensation intact b/l.   Dermatological Examination: Pedal skin warm and supple b/l. Toenails 1-5 b/l thick, discolored, elongated with subungual debris and pain on dorsal palpation.  No hyperkeratotic nor porokeratotic lesions present on today's visit.  Musculoskeletal Examination: Muscle strength 5/5 to b/l LE. No pain, crepitus or joint limitation noted with ROM bilateral LE. No gross bony deformities bilaterally.  Radiographs: None  Last A1c:      Latest Ref Rng & Units 05/15/2022    2:18 PM 02/04/2022    1:19 PM 10/16/2021   11:14 AM 08/06/2021    1:45 PM  Hemoglobin A1C  Hemoglobin-A1c 4.0 - 5.6 % 6.4  9.1  8.2  6.3    Assessment/Plan: 1. Pain due to onychomycosis of toenails of both feet   2. Diabetes mellitus without complication ([place])   -Patient was evaluated and treated. All patient's and/or POA's questions/concerns answered  on today's visit. -Examined patient. -Patient to continue soft, supportive shoe gear daily. -Mycotic toenails 1-5 bilaterally were debrided in length and girth with sterile nail nippers and dremel without incident. -Patient/POA to call should there be question/concern in the interim.   Return in about 3 months (around 08/17/2022).  Marzetta Board, DPM

## 2022-07-24 ENCOUNTER — Ambulatory Visit
Admission: RE | Admit: 2022-07-24 | Discharge: 2022-07-24 | Disposition: A | Discharge status: Home / Self Care | Payer: Medicare Other | Source: Ambulatory Visit | Attending: Family Medicine | Admitting: Family Medicine

## 2022-07-24 ENCOUNTER — Encounter (HOSPITAL_COMMUNITY)
Admission: RE | Admit: 2022-07-24 | Discharge: 2022-07-24 | Disposition: A | Discharge status: Home / Self Care | Payer: Medicare Other | Source: Ambulatory Visit | Attending: Nephrology | Admitting: Nephrology

## 2022-07-24 VITALS — BP 105/50 | HR 60 | Temp 98.2°F | Resp 18

## 2022-07-24 DIAGNOSIS — N184 Chronic kidney disease, stage 4 (severe): Secondary | ICD-10-CM | POA: Diagnosis not present

## 2022-07-24 DIAGNOSIS — Z1231 Encounter for screening mammogram for malignant neoplasm of breast: Secondary | ICD-10-CM

## 2022-07-24 LAB — FERRITIN: Ferritin: 375 ng/mL — ABNORMAL HIGH (ref 11–307)

## 2022-07-24 LAB — IRON AND TIBC
Iron: 95 ug/dL (ref 28–170)
Saturation Ratios: 36 % — ABNORMAL HIGH (ref 10.4–31.8)
TIBC: 267 ug/dL (ref 250–450)
UIBC: 172 ug/dL

## 2022-07-24 MED ORDER — EPOETIN ALFA-EPBX 10000 UNIT/ML IJ SOLN
INTRAMUSCULAR | Status: AC
  Administered 2022-07-24: 10000 [IU] via SUBCUTANEOUS
  Filled 2022-07-24: qty 1

## 2022-07-24 MED ORDER — EPOETIN ALFA-EPBX 10000 UNIT/ML IJ SOLN: 10000.0000 [IU] | INTRAMUSCULAR | Status: DC

## 2022-08-01 ENCOUNTER — Encounter (INDEPENDENT_AMBULATORY_CARE_PROVIDER_SITE_OTHER): Payer: Medicare Other | Admitting: Ophthalmology

## 2022-08-01 LAB — POCT HEMOGLOBIN-HEMACUE: Hemoglobin: 11.7 g/dL — ABNORMAL LOW (ref 12.0–15.0)

## 2022-08-08 ENCOUNTER — Other Ambulatory Visit: Payer: Self-pay | Admitting: Family Medicine

## 2022-08-15 ENCOUNTER — Encounter: Payer: Self-pay | Admitting: Family Medicine

## 2022-08-15 ENCOUNTER — Ambulatory Visit (INDEPENDENT_AMBULATORY_CARE_PROVIDER_SITE_OTHER): Payer: Medicare Other | Admitting: Family Medicine

## 2022-08-15 VITALS — BP 100/50 | HR 75 | Temp 98.0°F | Ht 64.0 in | Wt 177.0 lb

## 2022-08-15 DIAGNOSIS — E1159 Type 2 diabetes mellitus with other circulatory complications: Secondary | ICD-10-CM

## 2022-08-15 DIAGNOSIS — Z23 Encounter for immunization: Secondary | ICD-10-CM

## 2022-08-15 DIAGNOSIS — R600 Localized edema: Secondary | ICD-10-CM

## 2022-08-15 DIAGNOSIS — I152 Hypertension secondary to endocrine disorders: Secondary | ICD-10-CM | POA: Diagnosis not present

## 2022-08-15 DIAGNOSIS — N184 Chronic kidney disease, stage 4 (severe): Secondary | ICD-10-CM | POA: Diagnosis not present

## 2022-08-15 DIAGNOSIS — D638 Anemia in other chronic diseases classified elsewhere: Secondary | ICD-10-CM

## 2022-08-15 DIAGNOSIS — E1122 Type 2 diabetes mellitus with diabetic chronic kidney disease: Secondary | ICD-10-CM

## 2022-08-15 LAB — POCT GLYCOSYLATED HEMOGLOBIN (HGB A1C): Hemoglobin A1C: 6 % — AB (ref 4.0–5.6)

## 2022-08-15 MED ORDER — OZEMPIC (0.25 OR 0.5 MG/DOSE) 2 MG/3ML ~~LOC~~ SOPN: 0.5000 mg | PEN_INJECTOR | SUBCUTANEOUS | 3 refills | Status: DC

## 2022-08-15 NOTE — Progress Notes (Signed)
Subjective  CC:  Chief Complaint  Patient presents with   Diabetes    Pt here to F/U with DM     HPI: Teresa Barnes is a 76 y.o. female who presents to the office today for follow up of diabetes and problems listed above in the chief complaint.  Diabetes follow up: Her diabetic control is reported as Unchanged. Doing well on ozempic 0.'5mg'$  weekly and farxiga 10 daily. No AE.  She denies exertional CP or SOB or symptomatic hypoglycemia. She denies foot sores or paresthesias.  CKD: seeing renal next week and schedule for labwork on Monday. Had some leg swelling now on hctz daily and doing much better. Wearing compression stockings as well. Will get renal panel next week to recheck potassium HTN: low bp here but pt denies sxs of lightheadedness, palpitations. Renal aware. On losartan 50 Renal anemia: s/p iron infusions and getting monthly epo. Feeling better.   Wt Readings from Last 3 Encounters:  08/15/22 177 lb (80.3 kg)  05/15/22 181 lb 12.8 oz (82.5 kg)  02/04/22 180 lb (81.6 kg)    BP Readings from Last 3 Encounters:  08/15/22 (!) 100/50  07/24/22 (!) 105/50  07/08/22 (!) 131/45    Assessment  1. Type 2 diabetes mellitus with stage 4 chronic kidney disease, without long-term current use of insulin ([place])   2. Need for immunization against influenza   3. CKD (chronic kidney disease) stage 4, GFR 15-29 ml/min (H[facility])   4. Hypertension associated with diabetes ([place])   5. Anemia of chronic disease   6. Lower extremity edema      Plan  Diabetes is currently very well controlled. On farxiga and ozempic. Continue same dose.  HTN very well controlled; on the low side. Discussed need to monitor for lower blood pressure with weight loss and diabetic meds that can also lower bp + HCTZ. Pt is hesitant to make changes today. Recommend using hctz only as needed for leg swelling. CKD: will await blood work results. Need to monitor K and renal.  CBC is improving with treatments Flu shot  today  Follow up: 6 mo to recheck diabetes and bp. Orders Placed This Encounter  Procedures   Flu Vaccine QUAD High Dose(Fluad)   POCT HgB A1C   Meds ordered this encounter  Medications   OZEMPIC, 0.25 OR 0.5 MG/DOSE, 2 MG/3ML SOPN    Sig: Inject 0.5 mg into the skin once a week.    Dispense:  9 mL    Refill:  3      Immunization History  Administered Date(s) Administered   Fluad Quad(high Dose 65+) 08/18/2019, 08/06/2021, 08/15/2022   Influenza, High Dose Seasonal PF 08/18/2017, 07/15/2018, 08/14/2020   Influenza,inj,Quad PF,6+ Mos 08/01/2016   Influenza-Unspecified 08/18/2014   Moderna Covid-19 Vaccine Bivalent Booster 68yr & up 08/16/2021   Moderna Sars-Covid-2 Vaccination 01/01/2020, 01/29/2020, 09/25/2020   Pneumococcal Conjugate-13 12/09/2017   Pneumococcal Polysaccharide-23 09/24/2018   Zoster Recombinat (Shingrix) 10/21/2018, 01/27/2019    Diabetes Related Lab Review: Lab Results  Component Value Date   HGBA1C 6.0 (A) 08/15/2022   HGBA1C 6.4 (A) 05/15/2022   HGBA1C 9.1 (A) 02/04/2022    Lab Results  Component Value Date   MICROALBUR 4.9 (H) 05/15/2022   Lab Results  Component Value Date   CREATININE 1.96 (H) 05/15/2022   BUN 39 (H) 05/15/2022   NA 137 05/15/2022   K 4.7 05/15/2022   CL 105 05/15/2022   CO2 24 05/15/2022   Lab Results  Component Value Date   CHOL 143 05/15/2022   CHOL 150 04/12/2021   CHOL 171 05/03/2020   Lab Results  Component Value Date   HDL 60.10 05/15/2022   HDL 57.50 04/12/2021   HDL 52.80 05/03/2020   Lab Results  Component Value Date   LDLCALC 71 05/15/2022   LDLCALC 83 04/12/2021   LDLCALC 100 (H) 05/03/2020   Lab Results  Component Value Date   TRIG 61.0 05/15/2022   TRIG 49.0 04/12/2021   TRIG 91.0 05/03/2020   Lab Results  Component Value Date   CHOLHDL 2 05/15/2022   CHOLHDL 3 04/12/2021   CHOLHDL 3 05/03/2020   No results found for: "LDLDIRECT" The 10-year ASCVD risk score (Arnett DK, et al.,  2019) is: 21.4%   Values used to calculate the score:     Age: 76 years     Sex: Female     Is Non-Hispanic African American: Yes     Diabetic: Yes     Tobacco smoker: No     Systolic Blood Pressure: 678 mmHg     Is BP treated: Yes     HDL Cholesterol: 60.1 mg/dL     Total Cholesterol: 143 mg/dL I have reviewed the PMH, Fam and Soc history. Patient Active Problem List   Diagnosis Date Noted   Type 2 diabetes mellitus with stage 4 chronic kidney disease, without long-term current use of insulin ([place]) 08/09/2020    Priority: High   CKD (chronic kidney disease) stage 4, GFR 15-29 ml/min ([place]) 05/31/2020    Priority: High    Normal ultrasound, 07/2020; referred to renal 05/2020    Mixed hyperlipidemia 12/09/2017    Priority: High   Hypertension associated with diabetes ([place]) 12/09/2017    Priority: High   H/O: upper GI bleed 07/30/2016    Priority: High    Due to severe erosive esophagitis; 2017; healed with PPI by egd f/u; has [place] and stricture; s/p blood transfusion. 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 Scarlette Shorts; due to h/o severe recurrent erosive esophagitis.    Chronic gout 12/09/2017    Priority: Medium     On allopurinol    Glaucoma 12/09/2017    Priority: Medium    Degenerative arthritis of right knee 07/03/2017    Priority: Medium     Injected 07/03/2017' Injected August 05, 2018    Right lumbar radiculopathy 01/23/2016    Priority: Medium    Arthritis of right acromioclavicular joint 01/02/2016    Priority: Medium     Injected 01/02/2016 Repeat injection  03/12/2016    Greater trochanteric bursitis of right hip 11/17/2017    Priority: Low    Injected November 17, 2017 Repeat injection May 05, 2018    Lower extremity edema 08/15/2022   Acute bursitis of right shoulder 06/03/2019    Injected in June 03, 2019    Adhesive bursitis of left shoulder 08/05/2018    Injected August 05, 2018     Social History: Patient  reports  that she quit smoking about 29 years ago. Her smoking use included cigarettes. She has a 2.50 pack-year smoking history. She has never used smokeless tobacco. She reports that she does not drink alcohol and does not use drugs.  Review of Systems: Ophthalmic: negative for eye pain, loss of vision or double vision Cardiovascular: negative for chest pain Respiratory: negative for SOB or persistent cough Gastrointestinal: negative for abdominal pain Genitourinary: negative for dysuria or gross hematuria MSK: negative for foot lesions Neurologic: negative for weakness or gait disturbance  Objective  Vitals: BP (!) 100/50   Pulse 75   Temp 98 F (36.7 C)   Ht '5\' 4"'$  (1.626 m)   Wt 177 lb (80.3 kg)   SpO2 96%   BMI 30.38 kg/m  General: well appearing, no acute distress  Psych:  Alert and oriented, normal mood and affect HEENT:  Normocephalic, atraumatic, moist mucous membranes, supple neck  Cardiovascular:  Nl S1 and S2, RRR without murmur, gallop or rub. no edema Respiratory:  Good breath sounds bilaterally, CTAB with normal effort, no rales    Diabetic education: ongoing education regarding chronic disease management for diabetes was given today. We continue to reinforce the ABC's of diabetic management: A1c (<7 or 8 dependent upon patient), tight blood pressure control, and cholesterol management with goal LDL < 100 minimally. We discuss diet strategies, exercise recommendations, medication options and possible side effects. At each visit, we review recommended immunizations and preventive care  recommendations for diabetics and stress that good diabetic control can prevent other problems. See below for this patient's data.   Commons side effects, risks, benefits, and alternatives for medications and treatment plan prescribed today were discussed, and the patient expressed understanding of the given instructions. Patient is instructed to call or message via MyChart if he/she has any questions or concerns regarding our treatment plan. No barriers to understanding were identified. We discussed Red Flag symptoms and signs in detail. Patient expressed understanding regarding what to do in case of urgent or emergency type symptoms.  Medication list was reconciled, printed and provided to the patient in AVS. Patient instructions and summary information was reviewed with the patient as documented in the AVS. This note was prepared with assistance of Dragon voice recognition software. Occasional wrong-word or sound-a-like substitutions may have occurred due to the inherent limitations of voice recognition software

## 2022-08-15 NOTE — Patient Instructions (Addendum)
Please return in 6 months to recheck blood pressure and diabetes.   Please ask your kidney doctor to send me your lab results.   If you have any questions or concerns, please don't hesitate to send me a message via MyChart or call the office at [phone]. Thank you for visiting with [place] today! It's our pleasure caring for you.

## 2022-08-19 ENCOUNTER — Ambulatory Visit: Payer: Medicare Other | Admitting: Podiatry

## 2022-08-19 DIAGNOSIS — I129 Hypertensive chronic kidney disease with stage 1 through stage 4 chronic kidney disease, or unspecified chronic kidney disease: Secondary | ICD-10-CM | POA: Diagnosis not present

## 2022-08-19 DIAGNOSIS — N184 Chronic kidney disease, stage 4 (severe): Secondary | ICD-10-CM | POA: Diagnosis not present

## 2022-08-19 DIAGNOSIS — N2581 Secondary hyperparathyroidism of renal origin: Secondary | ICD-10-CM | POA: Diagnosis not present

## 2022-08-19 DIAGNOSIS — D638 Anemia in other chronic diseases classified elsewhere: Secondary | ICD-10-CM | POA: Diagnosis not present

## 2022-08-19 DIAGNOSIS — E1122 Type 2 diabetes mellitus with diabetic chronic kidney disease: Secondary | ICD-10-CM | POA: Diagnosis not present

## 2022-08-19 DIAGNOSIS — K2210 Ulcer of esophagus without bleeding: Secondary | ICD-10-CM | POA: Diagnosis not present

## 2022-08-19 DIAGNOSIS — R3129 Other microscopic hematuria: Secondary | ICD-10-CM | POA: Diagnosis not present

## 2022-08-21 ENCOUNTER — Encounter (HOSPITAL_COMMUNITY)
Admission: RE | Admit: 2022-08-21 | Discharge: 2022-08-21 | Disposition: A | Discharge status: Home / Self Care | Payer: Medicare Other | Source: Ambulatory Visit | Attending: Nephrology | Admitting: Nephrology

## 2022-08-21 VITALS — BP 93/42 | HR 55 | Temp 96.7°F

## 2022-08-21 DIAGNOSIS — N184 Chronic kidney disease, stage 4 (severe): Secondary | ICD-10-CM | POA: Diagnosis not present

## 2022-08-21 LAB — FERRITIN: Ferritin: 215 ng/mL (ref 11–307)

## 2022-08-21 LAB — IRON AND TIBC
Iron: 67 ug/dL (ref 28–170)
Saturation Ratios: 27 % (ref 10.4–31.8)
TIBC: 248 ug/dL — ABNORMAL LOW (ref 250–450)
UIBC: 181 ug/dL

## 2022-08-21 LAB — POCT HEMOGLOBIN-HEMACUE: Hemoglobin: 11.8 g/dL — ABNORMAL LOW (ref 12.0–15.0)

## 2022-08-21 MED ORDER — EPOETIN ALFA-EPBX 10000 UNIT/ML IJ SOLN
INTRAMUSCULAR | Status: AC
  Filled 2022-08-21: qty 1

## 2022-08-21 MED ORDER — EPOETIN ALFA-EPBX 10000 UNIT/ML IJ SOLN
10000.0000 [IU] | INTRAMUSCULAR | Status: DC
  Administered 2022-08-21: 10000 [IU] via SUBCUTANEOUS

## 2022-08-27 ENCOUNTER — Telehealth: Payer: Self-pay | Admitting: Family Medicine

## 2022-08-27 ENCOUNTER — Other Ambulatory Visit: Payer: Self-pay

## 2022-08-27 MED ORDER — OZEMPIC (0.25 OR 0.5 MG/DOSE) 2 MG/3ML ~~LOC~~ SOPN: 0.5000 mg | PEN_INJECTOR | SUBCUTANEOUS | 3 refills | Status: DC

## 2022-08-27 NOTE — Telephone Encounter (Signed)
Pt states: -Out of medication before 08/15/22 (last ov) -She reached out to [employer] and cannot get through or ahold of anyone. -Has not received any medicine or phone calls from pharmacy.   Pt requests: -call back about follow up and what can be done.

## 2022-08-28 ENCOUNTER — Encounter: Payer: Self-pay | Admitting: Podiatry

## 2022-08-28 ENCOUNTER — Ambulatory Visit: Payer: Medicare Other | Admitting: Podiatry

## 2022-08-28 DIAGNOSIS — M79674 Pain in right toe(s): Secondary | ICD-10-CM

## 2022-08-28 DIAGNOSIS — B351 Tinea unguium: Secondary | ICD-10-CM

## 2022-08-28 DIAGNOSIS — E119 Type 2 diabetes mellitus without complications: Secondary | ICD-10-CM

## 2022-08-28 DIAGNOSIS — M79675 Pain in left toe(s): Secondary | ICD-10-CM | POA: Diagnosis not present

## 2022-08-28 NOTE — Progress Notes (Signed)
  Subjective:  Patient ID: Teresa Barnes, female    DOB: Nov 28, 1945,  MRN: [id number]  ADALEI NOVELL presents to clinic today for:  Chief Complaint  Patient presents with   Nail Problem    Diabetic foot care BS- did not check today A1C-6.0 PCP-Camille Andy PCP [name]-08/15/2022   She relates tenderness to left great toe lateral border. Denies any redness, drainage or swelling from digit.   PCP is Leamon Arnt, MD .  Allergies  Allergen Reactions   Prednisone Nausea And Vomiting   Tramadol Nausea Only    Review of Systems: Negative except as noted in the HPI.  Objective: No changes noted in today's physical examination.  Teresa Barnes is a pleasant 76 y.o. female WD, WN in NAD. [name] x 3.  Vascular Examination: CFT <3 seconds b/l. DP/PT pulses faintly palpable b/l. Skin temperature gradient warm to warm b/l. No pain with calf compression. No ischemia or gangrene. No cyanosis or clubbing noted b/l.    Neurological Examination: Sensation grossly intact b/l with 10 gram monofilament. Vibratory sensation intact b/l.   Dermatological Examination: Pedal skin warm and supple b/l. Toenails 1-5 b/l thick, discolored, elongated with subungual debris and pain on dorsal palpation.    Incurvated nailplate left great toe lateral border(s) with tenderness to palpation. No erythema, no edema, no drainage noted.   No hyperkeratotic nor porokeratotic lesions present on today's visit.  Musculoskeletal Examination: Muscle strength 5/5 to b/l LE. No pain, crepitus or joint limitation noted with ROM bilateral LE. No gross bony deformities bilaterally.  Radiographs: None  Assessment/Plan: 1. Pain due to onychomycosis of toenails of both feet   2. Diabetes mellitus without complication ([place])     No orders of the defined types were placed in this encounter.   -Consent given for treatment as described below: -Stressed the importance of good glycemic control and the detriment of not   controlling glucose levels in relation to the foot. -Toenails 1-5 bilaterally debrided in length and girth without iatrogenic bleeding with sterile nail nipper and dremel.  -Offending nail border debrided and curretaged lateral border left hallux utilizing sterile nail nipper and currette. Border cleansed with alcohol and triple antibiotic applied. No further treatment required by patient/caregiver. Call office if there are any concerns. -Patient/POA to call should there be question/concern in the interim.   Return in about 3 months (around 11/28/2022).  Marzetta Board, DPM

## 2022-09-04 ENCOUNTER — Encounter: Payer: Self-pay | Admitting: Family Medicine

## 2022-09-18 ENCOUNTER — Ambulatory Visit (HOSPITAL_COMMUNITY)
Admission: RE | Admit: 2022-09-18 | Discharge: 2022-09-18 | Disposition: A | Discharge status: Home / Self Care | Payer: Medicare Other | Source: Ambulatory Visit | Attending: Nephrology | Admitting: Nephrology

## 2022-09-18 VITALS — BP 116/46 | HR 57 | Temp 97.1°F | Resp 20

## 2022-09-18 DIAGNOSIS — N184 Chronic kidney disease, stage 4 (severe): Secondary | ICD-10-CM | POA: Diagnosis not present

## 2022-09-18 LAB — IRON AND TIBC
Iron: 84 ug/dL (ref 28–170)
Saturation Ratios: 31 % (ref 10.4–31.8)
TIBC: 267 ug/dL (ref 250–450)
UIBC: 183 ug/dL

## 2022-09-18 LAB — FERRITIN: Ferritin: 154 ng/mL (ref 11–307)

## 2022-09-18 LAB — POCT HEMOGLOBIN-HEMACUE: Hemoglobin: 11.3 g/dL — ABNORMAL LOW (ref 12.0–15.0)

## 2022-09-18 MED ORDER — EPOETIN ALFA-EPBX 10000 UNIT/ML IJ SOLN
INTRAMUSCULAR | Status: AC
  Filled 2022-09-18: qty 1

## 2022-09-18 MED ORDER — EPOETIN ALFA-EPBX 10000 UNIT/ML IJ SOLN
10000.0000 [IU] | INTRAMUSCULAR | Status: DC
  Administered 2022-09-18: 10000 [IU] via SUBCUTANEOUS

## 2022-10-03 ENCOUNTER — Encounter (INDEPENDENT_AMBULATORY_CARE_PROVIDER_SITE_OTHER): Payer: Medicare Other | Admitting: Ophthalmology

## 2022-10-08 ENCOUNTER — Encounter: Payer: Self-pay | Admitting: *Deleted

## 2022-10-08 ENCOUNTER — Telehealth: Payer: Self-pay | Admitting: *Deleted

## 2022-10-08 NOTE — Patient Outreach (Signed)
  Care Coordination   Initial Visit Note   10/08/2022 Name: Teresa Barnes MRN: [id number] DOB: [birth year]  Teresa Barnes is a 76 y.o. year old female who sees Leamon Arnt, MD for primary care. I spoke with  [name] by phone today.  What matters to the patients health and wellness today?  No needs    Goals Addressed             This Visit's Progress    COMPLETED: No needs       Care Coordination Interventions: Provided education to patient and/or caregiver about advanced directives Reviewed medications with patient and discussed adherence with no needed refills Reviewed scheduled/upcoming provider appointments including sufficient transportation Screening for signs and symptoms of depression related to chronic disease state  Assessed social determinant of health barriers         SDOH assessments and interventions completed:  Yes  SDOH Interventions Today    Flowsheet Row Most Recent Value  SDOH Interventions   Food Insecurity Interventions Intervention Not Indicated  Housing Interventions Intervention Not Indicated  Transportation Interventions Intervention Not Indicated  Utilities Interventions Intervention Not Indicated        Care Coordination Interventions Activated:  Yes  Care Coordination Interventions:  Yes, provided   Follow up plan: No further intervention required.   Encounter Outcome:  Pt. Visit Completed   Raina Mina, RN Care Management Coordinator [place] Office [phone]

## 2022-10-08 NOTE — Patient Instructions (Signed)
Visit Information  Thank you for taking time to visit with me today. Please don't hesitate to contact me if I can be of assistance to you.   Following are the goals we discussed today:   Goals Addressed             This Visit's Progress    COMPLETED: No needs       Care Coordination Interventions: Provided education to patient and/or caregiver about advanced directives Reviewed medications with patient and discussed adherence with no needed refills Reviewed scheduled/upcoming provider appointments including sufficient transportation Screening for signs and symptoms of depression related to chronic disease state  Assessed social determinant of health barriers         Please call the care guide team at [phone] if you need to cancel or reschedule your appointment.   If you are experiencing a Mental Health or [place] or need someone to talk to, please call the Suicide and Crisis Lifeline: 988  The patient verbalized understanding of instructions, educational materials, and care plan provided today and DECLINED offer to receive copy of patient instructions, educational materials, and care plan.   No further follow up required: No needs presented today  Raina Mina, RN Care Management Coordinator [place] Office [phone]

## 2022-10-12 ENCOUNTER — Other Ambulatory Visit: Payer: Self-pay | Admitting: Family Medicine

## 2022-10-16 ENCOUNTER — Encounter (HOSPITAL_COMMUNITY)
Admission: RE | Admit: 2022-10-16 | Discharge: 2022-10-16 | Disposition: A | Discharge status: Home / Self Care | Payer: Medicare Other | Source: Ambulatory Visit | Attending: Nephrology | Admitting: Nephrology

## 2022-10-16 ENCOUNTER — Telehealth: Payer: Self-pay | Admitting: Family Medicine

## 2022-10-16 VITALS — BP 137/40 | Temp 97.9°F | Resp 17

## 2022-10-16 DIAGNOSIS — N184 Chronic kidney disease, stage 4 (severe): Secondary | ICD-10-CM | POA: Insufficient documentation

## 2022-10-16 LAB — IRON AND TIBC
Iron: 108 ug/dL (ref 28–170)
Saturation Ratios: 38 % — ABNORMAL HIGH (ref 10.4–31.8)
TIBC: 284 ug/dL (ref 250–450)
UIBC: 176 ug/dL

## 2022-10-16 LAB — POCT HEMOGLOBIN-HEMACUE: Hemoglobin: 11.6 g/dL — ABNORMAL LOW (ref 12.0–15.0)

## 2022-10-16 LAB — FERRITIN: Ferritin: 143 ng/mL (ref 11–307)

## 2022-10-16 MED ORDER — EPOETIN ALFA-EPBX 10000 UNIT/ML IJ SOLN
INTRAMUSCULAR | Status: AC
  Filled 2022-10-16: qty 1

## 2022-10-16 MED ORDER — EPOETIN ALFA-EPBX 10000 UNIT/ML IJ SOLN
10000.0000 [IU] | INTRAMUSCULAR | Status: DC
  Administered 2022-10-16: 10000 [IU] via SUBCUTANEOUS

## 2022-10-16 NOTE — Telephone Encounter (Signed)
Copied from [place] [phone]. Topic: Medicare AWV >> Oct 16, 2022 11:46 AM Gillis Santa wrote: Reason for CRM: LVM FOR PATIENT TO SCHEDULE AWVS [place] --[place]

## 2022-10-22 ENCOUNTER — Ambulatory Visit (INDEPENDENT_AMBULATORY_CARE_PROVIDER_SITE_OTHER): Payer: Medicare Other

## 2022-10-22 VITALS — Wt 177.0 lb

## 2022-10-22 DIAGNOSIS — Z0000 Encounter for general adult medical examination without abnormal findings: Secondary | ICD-10-CM

## 2022-10-22 NOTE — Patient Instructions (Signed)
[name] , Thank you for taking time to come for your Medicare Wellness Visit. I appreciate your ongoing commitment to your health goals. Please review the following plan we discussed and let me know if I can assist you in the future.   These are the goals we discussed:  Goals      Patient Stated     Get walking back like it was     Patient Stated     Get rid of walker      Weight (lb) < 165 lb (74.8 kg)     Lose weight by watching calorie intake.         This is a list of the screening recommended for you and due dates:  Health Maintenance  Topic Date Due   DTaP/Tdap/Td vaccine (1 - Tdap) Never done   COVID-19 Vaccine (5 - 2023-24 season) 07/19/2022   Hemoglobin A1C  02/13/2023   DEXA scan (bone density measurement)  02/17/2023   Yearly kidney function blood test for diabetes  05/16/2023   Yearly kidney health urinalysis for diabetes  05/16/2023   Complete foot exam   05/16/2023   Eye exam for diabetics  07/20/2023   Mammogram  07/25/2023   Medicare Annual Wellness Visit  10/23/2023   Pneumonia Vaccine  Completed   Flu Shot  Completed   Hepatitis C Screening: USPSTF Recommendation to screen - Ages 18-79 yo.  Completed   Zoster (Shingles) Vaccine  Completed   HPV Vaccine  Aged Out   Colon Cancer Screening  Discontinued    Advanced directives: Advance directive discussed with you today. Even though you declined this today please call our office should you change your mind and we can give you the proper paperwork for you to fill out.  Conditions/risks identified: none at this time  Next appointment: Follow up in one year for your annual wellness visit    Preventive Care 65 Years and Older, Female Preventive care refers to lifestyle choices and visits with your health care provider that can promote health and wellness. What does preventive care include? A yearly physical exam. This is also called an annual well check. Dental exams once or twice a year. Routine eye  exams. Ask your health care provider how often you should have your eyes checked. Personal lifestyle choices, including: Daily care of your teeth and gums. Regular physical activity. Eating a healthy diet. Avoiding tobacco and drug use. Limiting alcohol use. Practicing safe sex. Taking low-dose aspirin every day. Taking vitamin and mineral supplements as recommended by your health care provider. What happens during an annual well check? The services and screenings done by your health care provider during your annual well check will depend on your age, overall health, lifestyle risk factors, and family history of disease. Counseling  Your health care provider may ask you questions about your: Alcohol use. Tobacco use. Drug use. Emotional well-being. Home and relationship well-being. Sexual activity. Eating habits. History of falls. Memory and ability to understand (cognition). Work and work Statistician. Reproductive health. Screening  You may have the following tests or measurements: Height, weight, and BMI. Blood pressure. Lipid and cholesterol levels. These may be checked every 5 years, or more frequently if you are over 75 years old. Skin check. Lung cancer screening. You may have this screening every year starting at age 9 if you have a 30-pack-year history of smoking and currently smoke or have quit within the past 15 years. Fecal occult blood test (FOBT) of the stool.  You may have this test every year starting at age 42. Flexible sigmoidoscopy or colonoscopy. You may have a sigmoidoscopy every 5 years or a colonoscopy every 10 years starting at age 62. Hepatitis C blood test. Hepatitis B blood test. Sexually transmitted disease (STD) testing. Diabetes screening. This is done by checking your blood sugar (glucose) after you have not eaten for a while (fasting). You may have this done every 1-3 years. Bone density scan. This is done to screen for osteoporosis. You may have  this done starting at age 69. Mammogram. This may be done every 1-2 years. Talk to your health care provider about how often you should have regular mammograms. Talk with your health care provider about your test results, treatment options, and if necessary, the need for more tests. Vaccines  Your health care provider may recommend certain vaccines, such as: Influenza vaccine. This is recommended every year. Tetanus, diphtheria, and acellular pertussis (Tdap, Td) vaccine. You may need a Td booster every 10 years. Zoster vaccine. You may need this after age 73. Pneumococcal 13-valent conjugate (PCV13) vaccine. One dose is recommended after age 73. Pneumococcal polysaccharide (PPSV23) vaccine. One dose is recommended after age 51. Talk to your health care provider about which screenings and vaccines you need and how often you need them. This information is not intended to replace advice given to you by your health care provider. Make sure you discuss any questions you have with your health care provider. Document Released: 12/01/2015 Document Revised: 07/24/2016 Document Reviewed: 09/05/2015 Elsevier Interactive Patient Education  2017 [place] Prevention in the Home Falls can cause injuries. They can happen to people of all ages. There are many things you can do to make your home safe and to help prevent falls. What can I do on the outside of my home? Regularly fix the edges of walkways and driveways and fix any cracks. Remove anything that might make you trip as you walk through a door, such as a raised step or threshold. Trim any bushes or trees on the path to your home. Use bright outdoor lighting. Clear any walking paths of anything that might make someone trip, such as rocks or tools. Regularly check to see if handrails are loose or broken. Make sure that both sides of any steps have handrails. Any raised decks and porches should have guardrails on the edges. Have any leaves,  snow, or ice cleared regularly. Use sand or salt on walking paths during winter. Clean up any spills in your garage right away. This includes oil or grease spills. What can I do in the bathroom? Use night lights. Install grab bars by the toilet and in the tub and shower. Do not use towel bars as grab bars. Use non-skid mats or decals in the tub or shower. If you need to sit down in the shower, use a plastic, non-slip stool. Keep the floor dry. Clean up any water that spills on the floor as soon as it happens. Remove soap buildup in the tub or shower regularly. Attach bath mats securely with double-sided non-slip rug tape. Do not have throw rugs and other things on the floor that can make you trip. What can I do in the bedroom? Use night lights. Make sure that you have a light by your bed that is easy to reach. Do not use any sheets or blankets that are too big for your bed. They should not hang down onto the floor. Have a firm chair that has  side arms. You can use this for support while you get dressed. Do not have throw rugs and other things on the floor that can make you trip. What can I do in the kitchen? Clean up any spills right away. Avoid walking on wet floors. Keep items that you use a lot in easy-to-reach places. If you need to reach something above you, use a strong step stool that has a grab bar. Keep electrical cords out of the way. Do not use floor polish or wax that makes floors slippery. If you must use wax, use non-skid floor wax. Do not have throw rugs and other things on the floor that can make you trip. What can I do with my stairs? Do not leave any items on the stairs. Make sure that there are handrails on both sides of the stairs and use them. Fix handrails that are broken or loose. Make sure that handrails are as long as the stairways. Check any carpeting to make sure that it is firmly attached to the stairs. Fix any carpet that is loose or worn. Avoid having throw  rugs at the top or bottom of the stairs. If you do have throw rugs, attach them to the floor with carpet tape. Make sure that you have a light switch at the top of the stairs and the bottom of the stairs. If you do not have them, ask someone to add them for you. What else can I do to help prevent falls? Wear shoes that: Do not have high heels. Have rubber bottoms. Are comfortable and fit you well. Are closed at the toe. Do not wear sandals. If you use a stepladder: Make sure that it is fully opened. Do not climb a closed stepladder. Make sure that both sides of the stepladder are locked into place. Ask someone to hold it for you, if possible. Clearly mark and make sure that you can see: Any grab bars or handrails. First and last steps. Where the edge of each step is. Use tools that help you move around (mobility aids) if they are needed. These include: Canes. Walkers. Scooters. Crutches. Turn on the lights when you go into a dark area. Replace any light bulbs as soon as they burn out. Set up your furniture so you have a clear path. Avoid moving your furniture around. If any of your floors are uneven, fix them. If there are any pets around you, be aware of where they are. Review your medicines with your doctor. Some medicines can make you feel dizzy. This can increase your chance of falling. Ask your doctor what other things that you can do to help prevent falls. This information is not intended to replace advice given to you by your health care provider. Make sure you discuss any questions you have with your health care provider. Document Released: 08/31/2009 Document Revised: 04/11/2016 Document Reviewed: 12/09/2014 Elsevier Interactive Patient Education  2017 [employer].

## 2022-10-22 NOTE — Progress Notes (Signed)
I connected with  Teresa Barnes on 10/22/22 by a audio enabled telemedicine application and verified that I am speaking with the correct person using two identifiers.  Patient Location: Home  Provider Location: Office/Clinic  I discussed the limitations of evaluation and management by telemedicine. The patient expressed understanding and agreed to proceed.   Subjective:   Teresa Barnes is a 76 y.o. female who presents for Medicare Annual (Subsequent) preventive examination.  Review of Systems     Cardiac Risk Factors include: advanced age (>75mn, >>30women)     Objective:    Today's Vitals   10/22/22 1143  Weight: 177 lb (80.3 kg)   Body mass index is 30.38 kg/m.     10/22/2022   11:48 AM 10/18/2021    1:18 PM 08/10/2021    1:22 PM 11/21/2020    2:13 PM 09/21/2020    1:16 PM 07/19/2019    3:34 PM 03/29/2019    6:27 PM  Advanced Directives  Does Patient Have a Medical Advance Directive? No No Yes No No No No  Type of Advance Directive   Living will      Does patient want to make changes to medical advance directive?     No - Patient declined    Would patient like information on creating a medical advance directive? No - Patient declined No - Patient declined  No - Patient declined  No - Patient declined No - Patient declined    Current Medications (verified) Outpatient Encounter Medications as of 10/22/2022  Medication Sig   dorzolamide-timolol (COSOPT) 22.3-6.8 MG/ML ophthalmic solution Place 1 drop into both eyes 2 (two) times daily.   FARXIGA 10 MG TABS tablet Take 1 tablet by mouth once daily   latanoprost (XALATAN) 0.005 % ophthalmic solution Place 1 drop into both eyes at bedtime.   losartan (COZAAR) 50 MG tablet Take 50 mg by mouth daily.   omeprazole (PRILOSEC) 20 MG capsule Take 20 mg by mouth daily.   OZEMPIC, 0.25 OR 0.5 MG/DOSE, 2 MG/3ML SOPN Inject 0.5 mg into the skin once a week.   rosuvastatin (CRESTOR) 20 MG tablet Take 1 tablet by mouth once daily    vitamin B-12 (CYANOCOBALAMIN) 500 MCG tablet Take 500 mcg by mouth every other day.   Vitamin D, Cholecalciferol, 25 MCG (1000 UT) TABS Take 1 tablet by mouth daily.   No facility-administered encounter medications on file as of 10/22/2022.    Allergies (verified) Prednisone and Tramadol   History: Past Medical History:  Diagnosis Date   Anemia    Anemia of chronic disease 09/24/2018   Anxiety    Blood transfusion without reported diagnosis 07/2016   Cancer ([place] 1991   breast   Chronic gout 12/09/2017   On allopurinol   Diabetes mellitus without complication ([facility]    Esophageal stricture 12/09/2017   S/p dilation by GI: Dr. JScarlette Shorts due to h/o severe recurrent erosive esophagitis.   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   Injected left foot every 19 2018   GERD (gastroesophageal reflux disease)    Glaucoma    Hiatal hernia 12/09/2017   Hyperlipidemia    Hypertension    Right rotator cuff tear 11/24/2015   Injected this and 26 2017   Wears glasses    Past Surgical History:  Procedure Laterality Date   ANTERIOR CERVICAL DECOMP/DISCECTOMY FUSION N/A 03/26/2019   Procedure: Anterior Cervical Discectomy Fusion - Cervical Three-Cervical Four - Cervical Four-Cervical Five - Cervical Five-Cervical Six;  Surgeon: JSherley Bounds  S, MD;  Location: [facility];  Service: Neurosurgery;  Laterality: N/A;  Anterior Cervical Discectomy Fusion - Cervical Three-Cervical Four - Cervical Four-Cervical Five - Cervical Five-Cervical Six   CATARACT EXTRACTION W/ INTRAOCULAR LENS  IMPLANT, BILATERAL  2000   COLONOSCOPY     cyst on spine     ESOPHAGOGASTRODUODENOSCOPY (EGD) WITH PROPOFOL N/A 07/31/2016   Procedure: ESOPHAGOGASTRODUODENOSCOPY (EGD) WITH PROPOFOL;  Surgeon: Manus Gunning, MD;  Location: [facility];  Service: Gastroenterology;  Laterality: N/A;   EYE SURGERY  2011   lt eye blled   MASTECTOMY MODIFIED RADICAL Right 1990   REDUCTION MAMMAPLASTY Left    1990   TRIGGER FINGER RELEASE  Right 06/24/2013   Procedure: RELEASE TRIGGER FINGER/A-1 PULLEY PIP RIGHT LONG FINGER;  Surgeon: Cammie Sickle., MD;  Location: [facility];  Service: Orthopedics;  Laterality: Right;   TRIGGER FINGER RELEASE     TUBAL LIGATION     Family History  Problem Relation Age of Onset   Diabetes Mother    Diabetes Daughter    Arthritis Daughter    Hyperlipidemia Daughter    Diabetes Son    Arthritis Son    Diabetes Sister    Colon cancer Neg Hx    Cancer Neg Hx    Heart disease Neg Hx    Breast cancer Neg Hx    Stomach cancer Neg Hx    Pancreatic cancer Neg Hx    Esophageal cancer Neg Hx    Liver disease Neg Hx    Social History   Socioeconomic History   Marital status: Married    Spouse name: robert   Number of children: 2   Years of education: Not on file   Highest education level: Not on file  Occupational History   Occupation: retired Educational psychologist; [place]: 2006  Tobacco Use   Smoking status: Former    Packs/day: 0.25    Years: 10.00    Total pack years: 2.50    Types: Cigarettes    Quit date: 06/18/1993    Years since quitting: 29.3   Smokeless tobacco: Never  Vaping Use   Vaping Use: Never used  Substance and Sexual Activity   Alcohol use: No   Drug use: No   Sexual activity: Never  Other Topics Concern   Not on file  Social History Narrative   Originally from [place], [place]; 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; longevity in family   Social Determinants of Health   Financial Resource Strain: [place]  (10/22/2022)   Overall Financial Resource Strain (CARDIA)    Difficulty of Paying Living Expenses: Not hard at all  Food Insecurity: No Food Insecurity (10/22/2022)   Hunger Vital Sign    Worried About Running Out of Food in the Last Year: Never true    [place] in the Last Year: Never true  Transportation Needs: No Transportation Needs (10/22/2022)   PRAPARE - Hydrologist (Medical): No    Lack of  Transportation (Non-Medical): No  Physical Activity: Inactive (10/22/2022)   Exercise Vital Sign    Days of Exercise per Week: 0 days    Minutes of Exercise per Session: 0 min  Stress: No Stress Concern Present (10/22/2022)   [place]    Feeling of Stress : Not at all  Social Connections: Moderately Integrated (10/22/2022)   Social Connection and Isolation Panel [NHANES]  Frequency of Communication with Friends and Family: More than three times a week    Frequency of Social Gatherings with Friends and Family: More than three times a week    Attends Religious Services: More than 4 times per year    Active Member of [employer] or Organizations: No    Attends Music therapist: Never    Marital Status: Married    Tobacco Counseling Counseling given: Not Answered   Clinical Intake:  Pre-visit preparation completed: Yes  Pain : No/denies pain     BMI - recorded: 30.38 Nutritional Status: BMI > 30  Obese Nutritional Risks: None Diabetes: Yes CBG done?: No Did pt. bring in CBG monitor from home?: No  How often do you need to have someone help you when you read instructions, pamphlets, or other written materials from your doctor or pharmacy?: 1 - Never  Diabetic?Nutrition Risk Assessment:  Has the patient had any N/V/D within the last 2 months?  No  Does the patient have any non-healing wounds?  No  Has the patient had any unintentional weight loss or weight gain?  No   Diabetes:  Is the patient diabetic?  Yes  If diabetic, was a CBG obtained today?  No  Did the patient bring in their glucometer from home?  No  How often do you monitor your CBG's? N/A.   Financial Strains and Diabetes Management:  Are you having any financial strains with the device, your supplies or your medication? No .  Does the patient want to be seen by Chronic Care Management for management of their diabetes?  No  Would the  patient like to be referred to a Nutritionist or for Diabetic Management?  No   Diabetic Exams:  Diabetic Eye Exam: Completed 07/19/22 Diabetic Foot Exam: Completed 05/15/22   Interpreter Needed?: No  Information entered by :: Charlott Rakes, LPN   Activities of Daily Living    10/22/2022   11:50 AM  In your present state of health, do you have any difficulty performing the following activities:  Hearing? 0  Vision? 0  Difficulty concentrating or making decisions? 0  Walking or climbing stairs? 1  Comment avoid  Dressing or bathing? 0  Doing errands, shopping? 0  Preparing Food and eating ? N  Using the Toilet? N  In the past six months, have you accidently leaked urine? N  Do you have problems with loss of bowel control? N  Managing your Medications? N  Managing your Finances? N  Housekeeping or managing your Housekeeping? N    Patient Care Team: Leamon Arnt, MD as PCP - General (Family Medicine) Lyndal Pulley, DO as Consulting Physician (Family Medicine) Irene Shipper, MD as Consulting Physician (Gastroenterology) Hayden Pedro, MD as Consulting Physician (Ophthalmology) Marzetta Board, DPM as Consulting Physician (Podiatry) Eustace Moore, MD as Consulting Physician (Neurosurgery) Andee Lineman, NP as Nurse Practitioner (Nephrology)  Indicate any recent Medical Services you may have received from other than [facility] providers in the past year (date may be approximate).     Assessment:   This is a routine wellness examination for Teresa Barnes.  Hearing/Vision screen Hearing Screening - Comments:: Pt denies any hearing issues  Vision Screening - Comments:: Pt follows up with Dr Alanda Slim for annul eye exams   Dietary issues and exercise activities discussed: Current Exercise Habits: The patient does not participate in regular exercise at present   Goals Addressed  This Visit's Progress    No needs       Care Coordination  Interventions: Provided education to patient and/or caregiver about advanced directives Reviewed medications with patient and discussed adherence with no needed refills Reviewed scheduled/upcoming provider appointments including sufficient transportation Screening for signs and symptoms of depression related to chronic disease state  Assessed social determinant of health barriers        Depression Screen    10/22/2022   11:53 AM 10/08/2022    3:57 PM 08/15/2022   10:30 AM 05/15/2022    1:48 PM 10/18/2021    1:17 PM 10/16/2021   11:06 AM 09/21/2020    1:19 PM  PHQ 2/9 Scores  PHQ - 2 Score 0 0 0 0 0 0 0    Fall Risk    10/22/2022   11:50 AM 08/15/2022   10:30 AM 05/15/2022    1:47 PM 02/04/2022    1:17 PM 10/18/2021    1:20 PM  Early in the past year? 0 0 0 0 0  Number falls in past yr: 0 0 0 0 0  Injury with Fall? 0 0 0 1 0  Risk for fall due to : Impaired vision;Impaired balance/gait No Fall Risks No Fall Risks Impaired mobility Impaired vision  Risk for fall due to: Comment uses walker    use walker for safety  Follow up Falls prevention discussed Falls evaluation completed Falls evaluation completed Falls evaluation completed Falls prevention discussed    FALL RISK PREVENTION PERTAINING TO THE HOME:  Any stairs in or around the home? Yes  If so, are there any without handrails? No  Home free of loose throw rugs in walkways, pet beds, electrical cords, etc? Yes  Adequate lighting in your home to reduce risk of falls? Yes   ASSISTIVE DEVICES UTILIZED TO PREVENT FALLS:  Life alert? No  Use of a cane, walker or w/c? Yes  Grab bars in the bathroom? Yes  Shower chair or bench in shower? Yes  Elevated toilet seat or a handicapped toilet? Yes   TIMED UP AND GO:  Was the test performed? No .   Cognitive Function: Pt declined to complete testing stating her mouth hurt , pt knowledgeable to questions asked     07/15/2018    1:17 PM  MMSE - Mini Mental State  Exam  Orientation to time 5  Orientation to Place 5  Registration 3  Attention/ Calculation 5  Recall 0  Language- name 2 objects 2  Language- repeat 1  Language- follow 3 step command 3  Language- read & follow direction 1  Write a sentence 1  Copy design 1  Total score 27        10/22/2022   11:52 AM 10/18/2021    1:23 PM 09/21/2020    1:24 PM  6CIT Screen  What Year? 0 points 0 points 0 points  What month? 0 points 0 points 0 points  What time?  0 points   Count back from 20  0 points 0 points  Months in reverse  0 points 0 points  Repeat phrase  0 points 0 points  Total Score  0 points     Immunizations Immunization History  Administered Date(s) Administered   Fluad Quad(high Dose 65+) 08/18/2019, 08/06/2021, 08/15/2022   Influenza, High Dose Seasonal PF 08/18/2017, 07/15/2018, 08/14/2020   Influenza,inj,Quad PF,6+ Mos 08/01/2016   Influenza-Unspecified 08/18/2014   Moderna Covid-19 Vaccine Bivalent Booster 30yr & up 08/16/2021  Moderna Sars-Covid-2 Vaccination 01/01/2020, 01/29/2020, 09/25/2020   Pneumococcal Conjugate-13 12/09/2017   Pneumococcal Polysaccharide-23 09/24/2018   Zoster Recombinat (Shingrix) 10/21/2018, 01/27/2019    TDAP status: Due, Education has been provided regarding the importance of this vaccine. Advised may receive this vaccine at local pharmacy or Health Dept. Aware to provide a copy of the vaccination record if obtained from local pharmacy or Health Dept. Verbalized acceptance and understanding.  Flu Vaccine status: Up to date  Pneumococcal vaccine status: Up to date  Covid-19 vaccine status: Completed vaccines  Qualifies for Shingles Vaccine? Yes   Zostavax completed Yes   Shingrix Completed?: Yes  Screening Tests Health Maintenance  Topic Date Due   DTaP/Tdap/Td (1 - Tdap) Never done   COVID-19 Vaccine (5 - 2023-24 season) 07/19/2022   HEMOGLOBIN A1C  02/13/2023   DEXA SCAN  02/17/2023   Diabetic kidney evaluation - GFR  measurement  05/16/2023   Diabetic kidney evaluation - Urine ACR  05/16/2023   FOOT EXAM  05/16/2023   OPHTHALMOLOGY EXAM  07/20/2023   MAMMOGRAM  07/25/2023   Medicare Annual Wellness (AWV)  10/23/2023   Pneumonia Vaccine 2+ Years old  Completed   INFLUENZA VACCINE  Completed   Hepatitis C Screening  Completed   Zoster Vaccines- Shingrix  Completed   HPV VACCINES  Aged Out   COLONOSCOPY (Pts 45-64yr Insurance coverage will need to be confirmed)  Discontinued    Health Maintenance  Health Maintenance Due  Topic Date Due   DTaP/Tdap/Td (1 - Tdap) Never done   COVID-19 Vaccine (5 - 2023-24 season) 07/19/2022    Colorectal cancer screening: No longer required.   Mammogram status: No longer required due to 07/24/22.  Bone Density status: Completed 02/16/18. Results reflect: Bone density results: NORMAL. Repeat every 5 years.  Additional Screening:  Hepatitis C Screening:  Completed 12/09/17  Vision Screening: Recommended annual ophthalmology exams for early detection of glaucoma and other disorders of the eye. Is the patient up to date with their annual eye exam?  Yes  Who is the provider or what is the name of the office in which the patient attends annual eye exams? Dr MAlanda Slim If pt is not established with a provider, would they like to be referred to a provider to establish care? No .   Dental Screening: Recommended annual dental exams for proper oral hygiene  Community Resource Referral / Chronic Care Management: CRR required this visit?  No   CCM required this visit?  No      Plan:     I have personally reviewed and noted the following in the patient's chart:   Medical and social history Use of alcohol, tobacco or illicit drugs  Current medications and supplements including opioid prescriptions. Patient is not currently taking opioid prescriptions. Functional ability and status Nutritional status Physical activity Advanced directives List of other  physicians Hospitalizations, surgeries, and ER visits in previous 12 months Vitals Screenings to include cognitive, depression, and falls Referrals and appointments  In addition, I have reviewed and discussed with patient certain preventive protocols, quality metrics, and best practice recommendations. A written personalized care plan for preventive services as well as general preventive health recommendations were provided to patient.     TWillette Brace LPN   102/04/[id number]  Nurse Notes: Pt declined to complete 6CIT stating that her mouth hurt, Pt was knowledgeable and alert to questions asked

## 2022-11-13 ENCOUNTER — Ambulatory Visit (HOSPITAL_COMMUNITY)
Admission: RE | Admit: 2022-11-13 | Discharge: 2022-11-13 | Disposition: A | Discharge status: Home / Self Care | Payer: Medicare Other | Source: Ambulatory Visit | Attending: Nephrology | Admitting: Nephrology

## 2022-11-13 VITALS — BP 127/44 | HR 77 | Temp 97.3°F | Resp 18

## 2022-11-13 DIAGNOSIS — N184 Chronic kidney disease, stage 4 (severe): Secondary | ICD-10-CM | POA: Insufficient documentation

## 2022-11-13 LAB — FERRITIN: Ferritin: 143 ng/mL (ref 11–307)

## 2022-11-13 LAB — IRON AND TIBC
Iron: 98 ug/dL (ref 28–170)
Saturation Ratios: 33 % — ABNORMAL HIGH (ref 10.4–31.8)
TIBC: 300 ug/dL (ref 250–450)
UIBC: 202 ug/dL

## 2022-11-13 LAB — POCT HEMOGLOBIN-HEMACUE: Hemoglobin: 11.8 g/dL — ABNORMAL LOW (ref 12.0–15.0)

## 2022-11-13 MED ORDER — EPOETIN ALFA-EPBX 10000 UNIT/ML IJ SOLN
10000.0000 [IU] | INTRAMUSCULAR | Status: DC
  Administered 2022-11-13: 10000 [IU] via SUBCUTANEOUS

## 2022-11-13 MED ORDER — EPOETIN ALFA-EPBX 10000 UNIT/ML IJ SOLN
INTRAMUSCULAR | Status: AC
  Filled 2022-11-13: qty 1

## 2022-11-19 ENCOUNTER — Other Ambulatory Visit: Payer: Self-pay | Admitting: Family Medicine

## 2022-12-02 ENCOUNTER — Encounter (INDEPENDENT_AMBULATORY_CARE_PROVIDER_SITE_OTHER): Payer: Medicare Other | Admitting: Ophthalmology

## 2022-12-11 ENCOUNTER — Ambulatory Visit (HOSPITAL_COMMUNITY)
Admission: RE | Admit: 2022-12-11 | Discharge: 2022-12-11 | Disposition: A | Discharge status: Home / Self Care | Payer: Medicare Other | Source: Ambulatory Visit | Attending: Nephrology | Admitting: Nephrology

## 2022-12-11 VITALS — BP 112/42 | HR 58 | Temp 98.2°F | Resp 18

## 2022-12-11 DIAGNOSIS — N184 Chronic kidney disease, stage 4 (severe): Secondary | ICD-10-CM | POA: Insufficient documentation

## 2022-12-11 LAB — IRON AND TIBC
Iron: 99 ug/dL (ref 28–170)
Saturation Ratios: 36 % — ABNORMAL HIGH (ref 10.4–31.8)
TIBC: 279 ug/dL (ref 250–450)
UIBC: 180 ug/dL

## 2022-12-11 LAB — FERRITIN: Ferritin: 213 ng/mL (ref 11–307)

## 2022-12-11 LAB — POCT HEMOGLOBIN-HEMACUE: Hemoglobin: 10.8 g/dL — ABNORMAL LOW (ref 12.0–15.0)

## 2022-12-11 MED ORDER — EPOETIN ALFA-EPBX 10000 UNIT/ML IJ SOLN
INTRAMUSCULAR | Status: AC
  Filled 2022-12-11: qty 1

## 2022-12-11 MED ORDER — EPOETIN ALFA-EPBX 10000 UNIT/ML IJ SOLN
10000.0000 [IU] | INTRAMUSCULAR | Status: DC
  Administered 2022-12-11: 10000 [IU] via SUBCUTANEOUS

## 2022-12-17 ENCOUNTER — Other Ambulatory Visit: Payer: Self-pay | Admitting: Family Medicine

## 2022-12-23 ENCOUNTER — Ambulatory Visit: Payer: Medicare Other | Admitting: Podiatry

## 2022-12-23 ENCOUNTER — Encounter: Payer: Self-pay | Admitting: Podiatry

## 2022-12-23 VITALS — BP 142/49

## 2022-12-23 DIAGNOSIS — E119 Type 2 diabetes mellitus without complications: Secondary | ICD-10-CM | POA: Diagnosis not present

## 2022-12-23 DIAGNOSIS — M79675 Pain in left toe(s): Secondary | ICD-10-CM

## 2022-12-23 DIAGNOSIS — M79674 Pain in right toe(s): Secondary | ICD-10-CM | POA: Diagnosis not present

## 2022-12-23 DIAGNOSIS — B351 Tinea unguium: Secondary | ICD-10-CM

## 2022-12-23 DIAGNOSIS — L600 Ingrowing nail: Secondary | ICD-10-CM

## 2022-12-23 NOTE — Progress Notes (Signed)
  Subjective:  Patient ID: Teresa Barnes, female    DOB: 04-Nov-1946,  MRN: [id number]  AVANTE BRACK presents to clinic today for preventative diabetic foot care and painful thick toenails that are difficult to trim. Pain interferes with ambulation. Aggravating factors include wearing enclosed shoe gear. Pain is relieved with periodic professional debridement.  Chief Complaint  Patient presents with   Nail Problem    DFC BS-did not check today A1C-6.2 Jake Seats PCP [name]-09/2022   New problem(s): None.   PCP is [name], MD.  Allergies  Allergen Reactions   Prednisone Nausea And Vomiting   Tramadol Nausea Only    Review of Systems: Negative except as noted in the HPI.  Objective: No changes noted in today's physical examination. Vitals:   12/23/22 1528  BP: (!) 142/49   Teresa Barnes is a pleasant 77 y.o. female WD, WN in NAD. [name] x 3.  Vascular Examination: CFT <3 seconds b/l. DP/PT pulses faintly palpable b/l. Skin temperature gradient warm to warm b/l. No pain with calf compression. No ischemia or gangrene. No cyanosis or clubbing noted b/l.    Neurological Examination: Sensation grossly intact b/l with 10 gram monofilament. Vibratory sensation intact b/l.   Dermatological Examination: Pedal skin warm and supple b/l. Toenails 1-5 b/l thick, discolored, elongated with subungual debris and pain on dorsal palpation.    Incurvated nailplate left great toe lateral border(s) with tenderness to palpation. No erythema, no edema, no drainage noted.   No hyperkeratotic nor porokeratotic lesions present on today's visit.  Musculoskeletal Examination: Muscle strength 5/5 to b/l LE. No pain, crepitus or joint limitation noted with ROM bilateral LE. No gross bony deformities bilaterally.  Radiographs: None  Assessment/Plan: 1. Pain due to onychomycosis of toenails of both feet   2. Ingrown toenail without infection   3. Diabetes mellitus without complication  (HCC)     -Patient was evaluated and treated. All patient's and/or POA's questions/concerns answered on today's visit. -Continue supportive shoe gear daily. -Mycotic toenails 2-5 bilaterally and R hallux were debrided in length and girth with sterile nail nippers and dremel without iatrogenic bleeding. -No invasive procedure(s) performed. Offending nail border debrided and curretaged lateral border left hallux utilizing sterile nail nipper and currette. Border(s) cleansed with alcohol and triple antibiotic ointment applied. Patient/POA/Caregiver/Facility instructed to apply Neosporin Cream  to L hallux once daily for 7 days. Call office if there are any concerns. She is to schedule appointment with Dr. Lilian Kapur should she have any problem with the ingrown toenail. She related understanding. -Patient/POA to call should there be question/concern in the interim.   Return in about 3 months (around 03/23/2023).  Freddie Breech, DPM

## 2023-01-08 ENCOUNTER — Ambulatory Visit (HOSPITAL_COMMUNITY)
Admission: RE | Admit: 2023-01-08 | Discharge: 2023-01-08 | Disposition: A | Discharge status: Home / Self Care | Payer: Medicare Other | Source: Ambulatory Visit | Attending: Nephrology | Admitting: Nephrology

## 2023-01-08 ENCOUNTER — Encounter (INDEPENDENT_AMBULATORY_CARE_PROVIDER_SITE_OTHER): Payer: Medicare Other | Admitting: Ophthalmology

## 2023-01-08 VITALS — BP 145/42 | HR 57 | Temp 97.5°F | Resp 17

## 2023-01-08 DIAGNOSIS — H43811 Vitreous degeneration, right eye: Secondary | ICD-10-CM

## 2023-01-08 DIAGNOSIS — H338 Other retinal detachments: Secondary | ICD-10-CM

## 2023-01-08 DIAGNOSIS — I10 Essential (primary) hypertension: Secondary | ICD-10-CM

## 2023-01-08 DIAGNOSIS — H35033 Hypertensive retinopathy, bilateral: Secondary | ICD-10-CM | POA: Diagnosis not present

## 2023-01-08 DIAGNOSIS — N184 Chronic kidney disease, stage 4 (severe): Secondary | ICD-10-CM | POA: Insufficient documentation

## 2023-01-08 DIAGNOSIS — E113593 Type 2 diabetes mellitus with proliferative diabetic retinopathy without macular edema, bilateral: Secondary | ICD-10-CM | POA: Diagnosis not present

## 2023-01-08 LAB — IRON AND TIBC
Iron: 90 ug/dL (ref 28–170)
Saturation Ratios: 31 % (ref 10.4–31.8)
TIBC: 293 ug/dL (ref 250–450)
UIBC: 203 ug/dL

## 2023-01-08 LAB — POCT HEMOGLOBIN-HEMACUE: Hemoglobin: 9.9 g/dL — ABNORMAL LOW (ref 12.0–15.0)

## 2023-01-08 LAB — FERRITIN: Ferritin: 125 ng/mL (ref 11–307)

## 2023-01-08 MED ORDER — EPOETIN ALFA-EPBX 10000 UNIT/ML IJ SOLN
10000.0000 [IU] | INTRAMUSCULAR | Status: DC
  Administered 2023-01-08: 10000 [IU] via SUBCUTANEOUS

## 2023-01-08 MED ORDER — EPOETIN ALFA-EPBX 10000 UNIT/ML IJ SOLN
INTRAMUSCULAR | Status: AC
  Filled 2023-01-08: qty 1

## 2023-02-12 ENCOUNTER — Encounter (HOSPITAL_COMMUNITY)
Admission: RE | Admit: 2023-02-12 | Discharge: 2023-02-12 | Disposition: A | Discharge status: Home / Self Care | Payer: Medicare Other | Source: Ambulatory Visit | Attending: Nephrology | Admitting: Nephrology

## 2023-02-12 VITALS — BP 143/44 | HR 57 | Temp 98.0°F | Resp 18

## 2023-02-12 DIAGNOSIS — N184 Chronic kidney disease, stage 4 (severe): Secondary | ICD-10-CM | POA: Diagnosis not present

## 2023-02-12 LAB — IRON AND TIBC
Iron: 89 ug/dL (ref 28–170)
Saturation Ratios: 30 % (ref 10.4–31.8)
TIBC: 293 ug/dL (ref 250–450)
UIBC: 204 ug/dL

## 2023-02-12 LAB — POCT HEMOGLOBIN-HEMACUE: Hemoglobin: 9.7 g/dL — ABNORMAL LOW (ref 12.0–15.0)

## 2023-02-12 LAB — FERRITIN: Ferritin: 154 ng/mL (ref 11–307)

## 2023-02-12 MED ORDER — EPOETIN ALFA-EPBX 10000 UNIT/ML IJ SOLN
10000.0000 [IU] | INTRAMUSCULAR | Status: DC
  Administered 2023-02-12: 10000 [IU] via SUBCUTANEOUS

## 2023-02-12 MED ORDER — EPOETIN ALFA-EPBX 10000 UNIT/ML IJ SOLN
INTRAMUSCULAR | Status: AC
  Filled 2023-02-12: qty 1

## 2023-02-13 ENCOUNTER — Ambulatory Visit: Payer: Medicare Other | Admitting: Family Medicine

## 2023-02-17 DIAGNOSIS — K2210 Ulcer of esophagus without bleeding: Secondary | ICD-10-CM | POA: Diagnosis not present

## 2023-02-17 DIAGNOSIS — N2581 Secondary hyperparathyroidism of renal origin: Secondary | ICD-10-CM | POA: Diagnosis not present

## 2023-02-17 DIAGNOSIS — E1122 Type 2 diabetes mellitus with diabetic chronic kidney disease: Secondary | ICD-10-CM | POA: Diagnosis not present

## 2023-02-17 DIAGNOSIS — R3129 Other microscopic hematuria: Secondary | ICD-10-CM | POA: Diagnosis not present

## 2023-02-17 DIAGNOSIS — I129 Hypertensive chronic kidney disease with stage 1 through stage 4 chronic kidney disease, or unspecified chronic kidney disease: Secondary | ICD-10-CM | POA: Diagnosis not present

## 2023-02-17 DIAGNOSIS — N184 Chronic kidney disease, stage 4 (severe): Secondary | ICD-10-CM | POA: Diagnosis not present

## 2023-02-17 DIAGNOSIS — D638 Anemia in other chronic diseases classified elsewhere: Secondary | ICD-10-CM | POA: Diagnosis not present

## 2023-02-18 LAB — LAB REPORT - SCANNED
Creatinine, POC: 49.4 mg/dL
EGFR: 29
Protein/Creatinine Ratio: 356

## 2023-02-19 ENCOUNTER — Ambulatory Visit: Payer: Medicare Other | Admitting: Family Medicine

## 2023-02-26 ENCOUNTER — Ambulatory Visit (HOSPITAL_COMMUNITY)
Admission: RE | Admit: 2023-02-26 | Discharge: 2023-02-26 | Disposition: A | Discharge status: Home / Self Care | Payer: Medicare Other | Source: Ambulatory Visit | Attending: Nephrology | Admitting: Nephrology

## 2023-02-26 VITALS — BP 121/50 | HR 54 | Temp 97.2°F | Resp 17

## 2023-02-26 DIAGNOSIS — N184 Chronic kidney disease, stage 4 (severe): Secondary | ICD-10-CM | POA: Diagnosis not present

## 2023-02-26 LAB — POCT HEMOGLOBIN-HEMACUE: Hemoglobin: 10.5 g/dL — ABNORMAL LOW (ref 12.0–15.0)

## 2023-02-26 MED ORDER — EPOETIN ALFA-EPBX 10000 UNIT/ML IJ SOLN
10000.0000 [IU] | INTRAMUSCULAR | Status: DC
  Administered 2023-02-26: 10000 [IU] via SUBCUTANEOUS

## 2023-02-26 MED ORDER — EPOETIN ALFA-EPBX 10000 UNIT/ML IJ SOLN
INTRAMUSCULAR | Status: AC
  Filled 2023-02-26: qty 1

## 2023-02-28 ENCOUNTER — Ambulatory Visit (INDEPENDENT_AMBULATORY_CARE_PROVIDER_SITE_OTHER): Payer: Medicare Other | Admitting: Family Medicine

## 2023-02-28 ENCOUNTER — Encounter: Payer: Self-pay | Admitting: Family Medicine

## 2023-02-28 VITALS — BP 112/52 | HR 58 | Temp 97.8°F | Ht 64.0 in | Wt 168.2 lb

## 2023-02-28 DIAGNOSIS — I152 Hypertension secondary to endocrine disorders: Secondary | ICD-10-CM

## 2023-02-28 DIAGNOSIS — N184 Chronic kidney disease, stage 4 (severe): Secondary | ICD-10-CM

## 2023-02-28 DIAGNOSIS — R600 Localized edema: Secondary | ICD-10-CM

## 2023-02-28 DIAGNOSIS — E1159 Type 2 diabetes mellitus with other circulatory complications: Secondary | ICD-10-CM | POA: Diagnosis not present

## 2023-02-28 DIAGNOSIS — D638 Anemia in other chronic diseases classified elsewhere: Secondary | ICD-10-CM | POA: Diagnosis not present

## 2023-02-28 DIAGNOSIS — E1122 Type 2 diabetes mellitus with diabetic chronic kidney disease: Secondary | ICD-10-CM | POA: Diagnosis not present

## 2023-02-28 DIAGNOSIS — Z780 Asymptomatic menopausal state: Secondary | ICD-10-CM

## 2023-02-28 DIAGNOSIS — E782 Mixed hyperlipidemia: Secondary | ICD-10-CM | POA: Diagnosis not present

## 2023-02-28 LAB — LIPID PANEL
Cholesterol: 156 mg/dL (ref 0–200)
HDL: 58.3 mg/dL (ref >39.00)
LDL Cholesterol: 81 mg/dL (ref 0–99)
NonHDL: 97.27
Total CHOL/HDL Ratio: 3
Triglycerides: 80 mg/dL (ref 0.0–149.0)
VLDL: 16 mg/dL (ref 0.0–40.0)

## 2023-02-28 LAB — RENAL FUNCTION PANEL
Albumin: 4.3 g/dL (ref 3.5–5.2)
BUN: 58 mg/dL — ABNORMAL HIGH (ref 6–23)
CO2: 24 mEq/L (ref 19–32)
Calcium: 9.5 mg/dL (ref 8.4–10.5)
Chloride: 104 mEq/L (ref 96–112)
Creatinine, Ser: 1.83 mg/dL — ABNORMAL HIGH (ref 0.40–1.20)
GFR: 26.13 mL/min — ABNORMAL LOW (ref >60.00)
Glucose, Bld: 92 mg/dL (ref 70–99)
Phosphorus: 4.3 mg/dL (ref 2.3–4.6)
Potassium: 4.3 mEq/L (ref 3.5–5.1)
Sodium: 137 mEq/L (ref 135–145)

## 2023-02-28 LAB — HEPATIC FUNCTION PANEL
ALT: 11 U/L (ref 0–35)
AST: 17 U/L (ref 0–37)
Albumin: 4.3 g/dL (ref 3.5–5.2)
Alkaline Phosphatase: 50 U/L (ref 39–117)
Bilirubin, Direct: 0.1 mg/dL (ref 0.0–0.3)
Total Bilirubin: 0.6 mg/dL (ref 0.2–1.2)
Total Protein: 7 g/dL (ref 6.0–8.3)

## 2023-02-28 LAB — POCT GLYCOSYLATED HEMOGLOBIN (HGB A1C): Hemoglobin A1C: 5.5 % (ref 4.0–5.6)

## 2023-02-28 LAB — TSH: TSH: 0.57 u[IU]/mL (ref 0.35–5.50)

## 2023-02-28 NOTE — Progress Notes (Unsigned)
Subjective  CC:  Chief Complaint  Patient presents with   Diabetes   Hypertension    HPI: Teresa Barnes is a 77 y.o. female who presents to the office today for follow up of diabetes and problems listed above in the chief complaint.  Diabetes follow up: Her diabetic control is reported as {improved/worse/unchanged:3041574}. *** She denies exertional CP or SOB or symptomatic hypoglycemia. She denies foot sores or paresthesias.  Wt Readings from Last 3 Encounters:  02/28/23 168 lb 3.2 oz (76.3 kg)  10/22/22 177 lb (80.3 kg)  08/15/22 177 lb (80.3 kg)    BP Readings from Last 3 Encounters:  02/28/23 (!) 112/52  02/26/23 (!) 121/50  02/12/23 (!) 143/44    Assessment  No diagnosis found.   Plan  Diabetes is currently {DESC; WELL/POORLY/MARGINALLY:18601} controlled. ***  Follow up: No follow-ups on file.. No orders of the defined types were placed in this encounter.  No orders of the defined types were placed in this encounter.     Immunization History  Administered Date(s) Administered   Fluad Quad(high Dose 65+) 08/18/2019, 08/06/2021, 08/15/2022   Influenza, High Dose Seasonal PF 08/18/2017, 07/15/2018, 08/14/2020   Influenza,inj,Quad PF,6+ Mos 08/01/2016   Influenza-Unspecified 08/18/2014   Moderna Covid-19 Vaccine Bivalent Booster 53yrs & up 08/16/2021   Moderna Sars-Covid-2 Vaccination 01/01/2020, 01/29/2020, 09/25/2020   Pneumococcal Conjugate-13 12/09/2017   Pneumococcal Polysaccharide-23 09/24/2018   Zoster Recombinat (Shingrix) 10/21/2018, 01/27/2019    Diabetes Related Lab Review: Lab Results  Component Value Date   HGBA1C 6.0 (A) 08/15/2022   HGBA1C 6.4 (A) 05/15/2022   HGBA1C 9.1 (A) 02/04/2022    Lab Results  Component Value Date   MICROALBUR 4.9 (H) 05/15/2022   Lab Results  Component Value Date   CREATININE 1.96 (H) 05/15/2022   BUN 39 (H) 05/15/2022   NA 137 05/15/2022   K 4.7 05/15/2022   CL 105 05/15/2022   CO2 24 05/15/2022   Lab  Results  Component Value Date   CHOL 143 05/15/2022   CHOL 150 04/12/2021   CHOL 171 05/03/2020   Lab Results  Component Value Date   HDL 60.10 05/15/2022   HDL 57.50 04/12/2021   HDL 52.80 05/03/2020   Lab Results  Component Value Date   LDLCALC 71 05/15/2022   LDLCALC 83 04/12/2021   LDLCALC 100 (H) 05/03/2020   Lab Results  Component Value Date   TRIG 61.0 05/15/2022   TRIG 49.0 04/12/2021   TRIG 91.0 05/03/2020   Lab Results  Component Value Date   CHOLHDL 2 05/15/2022   CHOLHDL 3 04/12/2021   CHOLHDL 3 05/03/2020   No results found for: "LDLDIRECT" The 10-year ASCVD risk score (Arnett DK, et al., 2019) is: 45.1%   Values used to calculate the score:     Age: 54 years     Sex: Female     Is Non-Hispanic African American: Yes     Diabetic: Yes     Tobacco smoker: Yes     Systolic Blood Pressure: 112 mmHg     Is BP treated: Yes     HDL Cholesterol: 60.1 mg/dL     Total Cholesterol: 143 mg/dL I have reviewed the PMH, Fam and Soc history. Patient Active Problem List   Diagnosis Date Noted   Type 2 diabetes mellitus with stage 4 chronic kidney disease, without long-term current use of insulin 08/09/2020    Priority: High   CKD (chronic kidney disease) stage 4, GFR 15-29 ml/min 05/31/2020  Priority: High    Normal ultrasound, 07/2020; referred to renal 05/2020    Mixed hyperlipidemia 12/09/2017    Priority: High   Hypertension associated with diabetes 12/09/2017    Priority: High   H/O: upper GI bleed 07/30/2016    Priority: High    Due to severe erosive esophagitis; 2017; healed with PPI by egd f/u; has HH and stricture; s/p blood transfusion. 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 Yancey Flemings; due to h/o severe recurrent erosive esophagitis.    Chronic gout 12/09/2017    Priority: Medium     On allopurinol    Glaucoma 12/09/2017    Priority: Medium    Degenerative arthritis of right knee 07/03/2017    Priority: Medium     Injected 07/03/2017' Injected August 05, 2018    Right lumbar radiculopathy 01/23/2016    Priority: Medium    Arthritis of right acromioclavicular joint 01/02/2016    Priority: Medium     Injected 01/02/2016 Repeat injection 03/12/2016    Greater trochanteric bursitis of right hip 11/17/2017    Priority: Low    Injected November 17, 2017 Repeat injection May 05, 2018    Lower extremity edema 08/15/2022   Acute bursitis of right shoulder 06/03/2019    Injected in June 03, 2019    Adhesive bursitis of left shoulder 08/05/2018    Injected August 05, 2018     Social History: Patient  reports that she quit smoking about 29 years ago. Her smoking use included cigarettes. She has a 2.50 pack-year smoking history. She has never used smokeless tobacco. She reports that she does not drink alcohol and does not use drugs.  Review of Systems: Ophthalmic: negative for eye pain, loss of vision or double vision Cardiovascular: negative for chest pain Respiratory: negative for SOB or persistent cough Gastrointestinal: negative for abdominal pain Genitourinary: negative for dysuria or gross hematuria MSK: negative for foot lesions Neurologic: negative for weakness or gait disturbance  Objective  Vitals: BP (!) 112/52   Pulse (!) 58   Temp 97.8 F (36.6 C)   Ht 5\' 4"  (1.626 m)   Wt 168 lb 3.2 oz (76.3 kg)   SpO2 97%   BMI 28.87 kg/m  General: well appearing, no acute distress  Psych:  Alert and oriented, normal mood and affect HEENT:  Normocephalic, atraumatic, moist mucous membranes, supple neck   Cardiovascular:  Nl S1 and S2, RRR without murmur, gallop or rub. no edema Respiratory:  Good breath sounds bilaterally, CTAB with normal effort, no rales Gastrointestinal: normal BS, soft, nontender Skin:  Warm, no rashes Neurologic:   Mental status is normal. normal gait Foot exam: no erythema, pallor, or cyanosis visible nl proprioception and sensation to monofilament testing bilaterally, +2 distal pulses bilaterally    Diabetic education: ongoing education regarding chronic disease management for diabetes was given today. We continue to reinforce the ABC's of diabetic management: A1c (<7 or 8 dependent upon patient), tight blood pressure control, and cholesterol management with  goal LDL < 100 minimally. We discuss diet strategies, exercise recommendations, medication options and possible side effects. At each visit, we review recommended immunizations and preventive care recommendations for diabetics and stress that good diabetic control can prevent other problems. See below for this patient's data.   Commons side effects, risks, benefits, and alternatives for medications and treatment plan prescribed today were discussed, and the patient expressed understanding of the given instructions. Patient is instructed to call or message via MyChart if he/she has any questions or concerns regarding our treatment plan. No barriers to understanding were identified. We discussed Red Flag symptoms and signs in detail. Patient expressed understanding regarding what to do in case of urgent or emergency type symptoms.  Medication list was reconciled, printed and provided to the patient in AVS. Patient instructions and summary information was reviewed with the patient as documented in the AVS. This note was prepared with assistance of Dragon voice recognition software. Occasional wrong-word or sound-a-like substitutions may have occurred due to the inherent limitations of voice recognition software

## 2023-02-28 NOTE — Patient Instructions (Addendum)
Please return in 6 months for your annual complete physical.  I will release your lab results to you on your MyChart account with further instructions. You may see the results before I do, but when I review them I will send you a message with my report or have my assistant call you if things need to be discussed. Please reply to my message with any questions. Thank you!   I have ordered a mammogram and/or bone density for you as we discussed today: they are due in September 2024 [x]   Mammogram  [x]   Bone Density  Please call the office checked below to schedule your appointment:  [x]   [facility]      [address] [place], [place]        [phone]         []   [facility]  [address] [place], [place]  [phone]   If you have any questions or concerns, please don't hesitate to send me a message via MyChart or call the office at [phone]. Thank you for visiting with [place] today! It's our pleasure caring for you.

## 2023-03-01 MED ORDER — CETIRIZINE HCL 10 MG PO TABS: 10.0000 mg | ORAL_TABLET | Freq: Every day | ORAL | 3 refills | Status: DC

## 2023-03-03 ENCOUNTER — Telehealth: Payer: Self-pay | Admitting: Podiatry

## 2023-03-03 NOTE — Telephone Encounter (Signed)
Pt called stating she needed to get in to see you asap it was an emergency that he nail is growing into side of skin. I asked pt if it was an ingrown nail and she said it was. I explained that you did not do the ingrown procedure and I could get her in with another provider and she stated that you do it for her all the time. She is wanting to come in to see you asap and I did explain that you did not have any available appts at this time and I could add her to waitlist. She said you told her to call if she needed to get in and you would work her in.

## 2023-03-03 NOTE — Progress Notes (Signed)
Please call patient: I have reviewed his/her lab results. Her lab results are all stable. Cholesterol looks good. Liver and thyroid are normal. Kidney function is stable and potassium is normal.  Please fax results to kidney doc. Thanks!

## 2023-03-04 ENCOUNTER — Ambulatory Visit: Payer: Medicare Other | Admitting: Podiatry

## 2023-03-04 ENCOUNTER — Ambulatory Visit (INDEPENDENT_AMBULATORY_CARE_PROVIDER_SITE_OTHER): Payer: Medicare Other

## 2023-03-04 VITALS — BP 124/48

## 2023-03-04 DIAGNOSIS — L02612 Cutaneous abscess of left foot: Secondary | ICD-10-CM

## 2023-03-04 DIAGNOSIS — M79675 Pain in left toe(s): Secondary | ICD-10-CM

## 2023-03-04 DIAGNOSIS — M79674 Pain in right toe(s): Secondary | ICD-10-CM

## 2023-03-04 DIAGNOSIS — L600 Ingrowing nail: Secondary | ICD-10-CM | POA: Diagnosis not present

## 2023-03-04 DIAGNOSIS — B351 Tinea unguium: Secondary | ICD-10-CM | POA: Diagnosis not present

## 2023-03-04 DIAGNOSIS — E119 Type 2 diabetes mellitus without complications: Secondary | ICD-10-CM

## 2023-03-04 MED ORDER — DOXYCYCLINE HYCLATE 100 MG PO CAPS
100.0000 mg | ORAL_CAPSULE | Freq: Two times a day (BID) | ORAL | 0 refills | Status: AC
Start: 2023-03-04 — End: 2023-03-14

## 2023-03-04 NOTE — Progress Notes (Signed)
Subjective:  Patient ID: Teresa Barnes, female    DOB: 11-15-1946,  MRN: [id number]  ARMINE RIZZOLO presents to clinic today for preventative diabetic foot care and painful thick toenails that are difficult to trim. Pain interferes with ambulation. Aggravating factors include wearing enclosed shoe gear. Pain is relieved with periodic professional debridement.  Chief Complaint  Patient presents with   Nail Problem    DFC BS-did not check today A1C-5.9 PCP-Andy PCP [name]-Last week   New problem(s): ingrown toenail to the lateral border left hallux. Patient is accompanied by her daughter on today's visit. Patient relates painful left great toe lateral border. States it is tender to touch. She has been applying antibiotic ointment for the past week. Denies any drainage or swelling.  PCP is [name], MD.  Allergies  Allergen Reactions   Prednisone Nausea And Vomiting   Tramadol Nausea Only    Review of Systems: Negative except as noted in the HPI.  Objective:  Vitals:   03/04/23 0816  BP: (!) 124/48   Teresa Barnes is a pleasant 77 y.o. female WD, WN in NAD. [name] x 3.  Vascular Examination: CFT <3 seconds b/l. DP/PT pulses faintly palpable b/l. Skin temperature gradient warm to warm b/l. No pain with calf compression. No ischemia or gangrene. No cyanosis or clubbing noted b/l.    Neurological Examination: Sensation grossly intact b/l with 10 gram monofilament. Vibratory sensation intact b/l.   Dermatological Examination: Pedal skin warm and supple b/l. Toenails 1-5 b/l thick, discolored, elongated with subungual debris and pain on dorsal palpation.    Incurvated nailplate left great toe lateral border(s) with tenderness to palpation. +Tenderness to palpation. Post-debridement, there is localized seropurulent drainage with no penetration into deep tissues.  No hyperkeratotic nor porokeratotic lesions present on today's visit.  Musculoskeletal Examination: Muscle strength  5/5 to b/l LE. No pain, crepitus or joint limitation noted with ROM bilateral LE. No gross bony deformities bilaterally.  Xray findings left foot: No gas in tissues left foot. No bone erosion noted distal phalanx left hallux. No foreign body evident left foot.  Assessment/Plan: 1. Pain due to onychomycosis of toenails of both feet   2. Ingrown toenail with infection   3. Abscess of left great toe   4. Diabetes mellitus without complication     Meds ordered this encounter  Medications   doxycycline (VIBRAMYCIN) 100 MG capsule    Sig: Take 1 capsule (100 mg total) by mouth 2 (two) times daily for 10 days.    Dispense:  20 capsule    Refill:  0    -Patient was evaluated and treated. All patient's and/or POA's questions/concerns answered on today's visit. -Examined patient. -Continue foot and shoe inspections daily. Monitor blood glucose per PCP/Endocrinologist's recommendations. -Patient to continue soft, supportive shoe gear daily. -Toenails were debrided in length and girth 2-5 bilaterally and right great toe with sterile nail nippers and dremel without iatrogenic bleeding.  -No invasive procedure(s) performed. Offending nail border debrided and curretaged lateral border left hallux utilizing sterile nail nipper and currette. Border(s) cleansed with alcohol and triple antibiotic ointment applied. Dispensed written instructions for once daily epsom salt soaks for 7 days. Call office if there are any concerns. -Xray of left foot was performed and reviewed with patient and/or POA. -Rx for Doxycyline 100 mg, #20, to be taken one capsule twice daily for 10 days. -Patient's daughter will sign patient up for MyChart and contact me if condition does not improve. -Patient/POA to  call should there be question/concern in the interim.   Return in about 9 weeks (around 05/06/2023).  Freddie Breech, DPM

## 2023-03-08 ENCOUNTER — Encounter: Payer: Self-pay | Admitting: Podiatry

## 2023-03-08 NOTE — Progress Notes (Incomplete)
Subjective:  Patient ID: Teresa Barnes, female    DOB: Oct 15, 1946,  MRN: [id number]  Teresa Barnes presents to clinic today for preventative diabetic foot care and painful thick toenails that are difficult to trim. Pain interferes with ambulation. Aggravating factors include wearing enclosed shoe gear. Pain is relieved with periodic professional debridement.  Chief Complaint  Patient presents with  . Nail Problem    DFC BS-did not check today A1C-5.9 PCP-Andy PCP [name]-Last week   New problem(s): ingrown toenail to the lateral border left hallux. Patient is accompanied by her daughter on today's visit. Patient relates painful left great toe lateral border. States it is tender to touch. She has been applying antibiotic ointment for the past week. Denies any drainage or swelling.  PCP is [name], MD.  Allergies  Allergen Reactions  . Prednisone Nausea And Vomiting  . Tramadol Nausea Only    Review of Systems: Negative except as noted in the HPI.  Objective:  Vitals:   03/04/23 0816  BP: (!) 124/48   Teresa Barnes is a pleasant 77 y.o. female WD, WN in NAD. [name] x 3.  Vascular Examination: CFT <3 seconds b/l. DP/PT pulses faintly palpable b/l. Skin temperature gradient warm to warm b/l. No pain with calf compression. No ischemia or gangrene. No cyanosis or clubbing noted b/l.    Neurological Examination: Sensation grossly intact b/l with 10 gram monofilament. Vibratory sensation intact b/l.   Dermatological Examination: Pedal skin warm and supple b/l. Toenails 1-5 b/l thick, discolored, elongated with subungual debris and pain on dorsal palpation.    Incurvated nailplate left great toe lateral border(s) with tenderness to palpation. +Tenderness to palpation. Post-debridement, there is localized seropurulent drainage with no penetration into deep tissues.  No hyperkeratotic nor porokeratotic lesions present on today's visit.  Musculoskeletal Examination: Muscle  strength 5/5 to b/l LE. No pain, crepitus or joint limitation noted with ROM bilateral LE. No gross bony deformities bilaterally.  Xray findings left foot: No gas in tissues left foot. No bone erosion noted distal phalanx left hallux. No foreign body evident left foot.   Assessment/Plan: 1. Pain due to onychomycosis of toenails of both feet   2. Ingrown toenail with infection   3. Abscess of left great toe   4. Diabetes mellitus without complication     Meds ordered this encounter  Medications  . doxycycline (VIBRAMYCIN) 100 MG capsule    Sig: Take 1 capsule (100 mg total) by mouth 2 (two) times daily for 10 days.    Dispense:  20 capsule    Refill:  0    -Patient was evaluated and treated. All patient's and/or POA's questions/concerns answered on today's visit. -Examined patient. -Continue foot and shoe inspections daily. Monitor blood glucose per PCP/Endocrinologist's recommendations. -Patient to continue soft, supportive shoe gear daily. -Toenails were debrided in length and girth 2-5 bilaterally and right great toe with sterile nail nippers and dremel without iatrogenic bleeding.  -No invasive procedure(s) performed. Offending nail border debrided and curretaged lateral border left hallux utilizing sterile nail nipper and currette. Border(s) cleansed with alcohol and triple antibiotic ointment applied. Dispensed written instructions for once daily epsom salt soaks for 7 days. Call office if there are any concerns. -Xray of left foot was performed and reviewed with patient and/or POA. -Rx for Doxycyline 100 mg, #20, to be taken one capsule twice daily for 10 days. -Patient/POA to call should there be question/concern in the interim.   No follow-ups on file.  Marzetta Board, DPM

## 2023-03-12 ENCOUNTER — Ambulatory Visit (HOSPITAL_COMMUNITY)
Admission: RE | Admit: 2023-03-12 | Discharge: 2023-03-12 | Disposition: A | Discharge status: Home / Self Care | Payer: Medicare Other | Source: Ambulatory Visit | Attending: Nephrology | Admitting: Nephrology

## 2023-03-12 VITALS — BP 108/40 | HR 73 | Temp 97.4°F | Resp 18

## 2023-03-12 DIAGNOSIS — N184 Chronic kidney disease, stage 4 (severe): Secondary | ICD-10-CM | POA: Insufficient documentation

## 2023-03-12 LAB — FERRITIN: Ferritin: 65 ng/mL (ref 11–307)

## 2023-03-12 LAB — IRON AND TIBC
Iron: 74 ug/dL (ref 28–170)
Saturation Ratios: 22 % (ref 10.4–31.8)
TIBC: 342 ug/dL (ref 250–450)
UIBC: 268 ug/dL

## 2023-03-12 LAB — POCT HEMOGLOBIN-HEMACUE: Hemoglobin: 11.8 g/dL — ABNORMAL LOW (ref 12.0–15.0)

## 2023-03-12 MED ORDER — EPOETIN ALFA-EPBX 10000 UNIT/ML IJ SOLN
INTRAMUSCULAR | Status: AC
  Administered 2023-03-12: 10000 [IU] via SUBCUTANEOUS
  Filled 2023-03-12: qty 1

## 2023-03-12 MED ORDER — EPOETIN ALFA-EPBX 10000 UNIT/ML IJ SOLN: 10000.0000 [IU] | INTRAMUSCULAR | Status: DC

## 2023-03-26 ENCOUNTER — Ambulatory Visit (HOSPITAL_COMMUNITY)
Admission: RE | Admit: 2023-03-26 | Discharge: 2023-03-26 | Disposition: A | Discharge status: Home / Self Care | Payer: Medicare Other | Source: Ambulatory Visit | Attending: Nephrology | Admitting: Nephrology

## 2023-03-26 VITALS — BP 149/48 | HR 57 | Temp 97.3°F | Resp 18

## 2023-03-26 DIAGNOSIS — N184 Chronic kidney disease, stage 4 (severe): Secondary | ICD-10-CM | POA: Diagnosis not present

## 2023-03-26 LAB — POCT HEMOGLOBIN-HEMACUE: Hemoglobin: 10.8 g/dL — ABNORMAL LOW (ref 12.0–15.0)

## 2023-03-26 MED ORDER — EPOETIN ALFA-EPBX 10000 UNIT/ML IJ SOLN: 10000.0000 [IU] | INTRAMUSCULAR | Status: DC

## 2023-03-26 MED ORDER — EPOETIN ALFA-EPBX 10000 UNIT/ML IJ SOLN
INTRAMUSCULAR | Status: AC
  Administered 2023-03-26: 10000 [IU] via SUBCUTANEOUS
  Filled 2023-03-26: qty 1

## 2023-03-27 ENCOUNTER — Other Ambulatory Visit: Payer: Self-pay | Admitting: Family Medicine

## 2023-04-08 ENCOUNTER — Ambulatory Visit: Payer: Medicare Other | Admitting: Podiatry

## 2023-04-09 ENCOUNTER — Ambulatory Visit (HOSPITAL_COMMUNITY)
Admission: RE | Admit: 2023-04-09 | Discharge: 2023-04-09 | Disposition: A | Discharge status: Home / Self Care | Payer: Medicare Other | Source: Ambulatory Visit | Attending: Nephrology | Admitting: Nephrology

## 2023-04-09 VITALS — BP 129/55 | HR 50 | Temp 97.3°F | Resp 16

## 2023-04-09 DIAGNOSIS — N184 Chronic kidney disease, stage 4 (severe): Secondary | ICD-10-CM | POA: Diagnosis not present

## 2023-04-09 LAB — FERRITIN: Ferritin: 49 ng/mL (ref 11–307)

## 2023-04-09 LAB — IRON AND TIBC
Iron: 54 ug/dL (ref 28–170)
Saturation Ratios: 16 % (ref 10.4–31.8)
TIBC: 339 ug/dL (ref 250–450)
UIBC: 285 ug/dL

## 2023-04-09 MED ORDER — EPOETIN ALFA-EPBX 10000 UNIT/ML IJ SOLN: 10000.0000 [IU] | INTRAMUSCULAR | Status: DC

## 2023-04-10 LAB — POCT HEMOGLOBIN-HEMACUE: Hemoglobin: 12.2 g/dL (ref 12.0–15.0)

## 2023-04-23 ENCOUNTER — Encounter (HOSPITAL_COMMUNITY)
Admission: RE | Admit: 2023-04-23 | Discharge: 2023-04-23 | Disposition: A | Discharge status: Home / Self Care | Payer: Medicare Other | Source: Ambulatory Visit | Attending: Nephrology | Admitting: Nephrology

## 2023-04-23 VITALS — BP 137/57 | HR 50 | Temp 97.2°F | Resp 17

## 2023-04-23 DIAGNOSIS — N184 Chronic kidney disease, stage 4 (severe): Secondary | ICD-10-CM | POA: Insufficient documentation

## 2023-04-23 LAB — POCT HEMOGLOBIN-HEMACUE: Hemoglobin: 11.7 g/dL — ABNORMAL LOW (ref 12.0–15.0)

## 2023-04-23 MED ORDER — EPOETIN ALFA-EPBX 10000 UNIT/ML IJ SOLN
INTRAMUSCULAR | Status: AC
  Filled 2023-04-23: qty 1

## 2023-04-23 MED ORDER — EPOETIN ALFA-EPBX 10000 UNIT/ML IJ SOLN
10000.0000 [IU] | INTRAMUSCULAR | Status: DC
  Administered 2023-04-23: 10000 [IU] via SUBCUTANEOUS

## 2023-04-30 ENCOUNTER — Other Ambulatory Visit: Payer: Self-pay | Admitting: Family Medicine

## 2023-04-30 DIAGNOSIS — Z1231 Encounter for screening mammogram for malignant neoplasm of breast: Secondary | ICD-10-CM

## 2023-05-07 ENCOUNTER — Encounter (HOSPITAL_COMMUNITY): Payer: Medicare Other

## 2023-05-14 ENCOUNTER — Ambulatory Visit: Payer: Medicare Other | Admitting: Podiatry

## 2023-05-14 ENCOUNTER — Encounter: Payer: Self-pay | Admitting: Podiatry

## 2023-05-14 VITALS — BP 126/44

## 2023-05-14 DIAGNOSIS — B351 Tinea unguium: Secondary | ICD-10-CM | POA: Diagnosis not present

## 2023-05-14 DIAGNOSIS — M79675 Pain in left toe(s): Secondary | ICD-10-CM

## 2023-05-14 DIAGNOSIS — E119 Type 2 diabetes mellitus without complications: Secondary | ICD-10-CM | POA: Diagnosis not present

## 2023-05-14 DIAGNOSIS — M79674 Pain in right toe(s): Secondary | ICD-10-CM

## 2023-05-14 DIAGNOSIS — L600 Ingrowing nail: Secondary | ICD-10-CM

## 2023-05-18 NOTE — Progress Notes (Signed)
  Subjective:  Patient ID: Teresa Barnes, female    DOB: 01-16-1946,  MRN: [id number]  Teresa Barnes presents to clinic today for preventative diabetic foot care and painful, discolored, thick toenails which interfere with daily activities  Chief Complaint  Patient presents with   Nail Problem    DFC,A1C:5.5,BS: unknown,  LOV:04/24   New problem(s): None.   PCP is [name], MD.  Allergies  Allergen Reactions   Prednisone Nausea And Vomiting   Tramadol Nausea Only    Review of Systems: Negative except as noted in the HPI.  Objective:  Vitals:   05/14/23 1507 05/14/23 1525  BP: (!) 114/39 (!) 126/44   Teresa Barnes is a pleasant 77 y.o. female in NAD. [name] x 3.  Vascular Examination: CFT <3 seconds b/l. DP/PT pulses faintly palpable b/l. Skin temperature gradient warm to warm b/l. No pain with calf compression. No ischemia or gangrene. No cyanosis or clubbing noted b/l.    Neurological Examination: Sensation grossly intact b/l with 10 gram monofilament. Vibratory sensation intact b/l.   Dermatological Examination: Pedal skin warm and supple b/l.   No open wounds. No interdigital macerations.  Toenails 1-5 b/l thick, discolored, elongated with subungual debris and pain on dorsal palpation.    Left great toe lateral border healed. No hyperkeratotic nor porokeratotic lesions present on today's visit.  Musculoskeletal Examination: Muscle strength 5/5 to b/l LE. No pain, crepitus or joint limitation noted with ROM bilateral LE. No gross bony deformities bilaterally.  Radiographs: None  Last A1c:      Latest Ref Rng & Units 02/28/2023   10:17 AM 08/15/2022   10:40 AM  Hemoglobin A1C  Hemoglobin-A1c 4.0 - 5.6 % 5.5  6.0    Assessment/Plan: 1. Pain due to onychomycosis of toenails of both feet   2. Ingrown toenail with infection   3. Diabetes mellitus without complication (HCC)     -Consent given for treatment as described below: -Abscess resolved left great  toe. Resume normal daily activity. -Continue foot and shoe inspections daily. Monitor blood glucose per PCP/Endocrinologist's recommendations. -Mycotic toenails 1-5 bilaterally were debrided in length and girth with sterile nail nippers and dremel without iatrogenic bleeding. -Patient/POA to call should there be question/concern in the interim.   Return in about 9 weeks (around 07/16/2023).  Freddie Breech, DPM

## 2023-05-19 DIAGNOSIS — E1122 Type 2 diabetes mellitus with diabetic chronic kidney disease: Secondary | ICD-10-CM | POA: Diagnosis not present

## 2023-05-19 DIAGNOSIS — N2581 Secondary hyperparathyroidism of renal origin: Secondary | ICD-10-CM | POA: Diagnosis not present

## 2023-05-19 DIAGNOSIS — N184 Chronic kidney disease, stage 4 (severe): Secondary | ICD-10-CM | POA: Diagnosis not present

## 2023-05-19 DIAGNOSIS — D638 Anemia in other chronic diseases classified elsewhere: Secondary | ICD-10-CM | POA: Diagnosis not present

## 2023-05-19 DIAGNOSIS — K2210 Ulcer of esophagus without bleeding: Secondary | ICD-10-CM | POA: Diagnosis not present

## 2023-05-19 DIAGNOSIS — I129 Hypertensive chronic kidney disease with stage 1 through stage 4 chronic kidney disease, or unspecified chronic kidney disease: Secondary | ICD-10-CM | POA: Diagnosis not present

## 2023-05-19 DIAGNOSIS — R3129 Other microscopic hematuria: Secondary | ICD-10-CM | POA: Diagnosis not present

## 2023-05-19 LAB — PROTEIN / CREATININE RATIO, URINE: Albumin, U: 389

## 2023-05-20 LAB — LAB REPORT - SCANNED
Albumin, Urine POC: 4.2
Creatinine, POC: 1.66 mg/dL
EGFR: 31

## 2023-05-21 ENCOUNTER — Encounter (HOSPITAL_COMMUNITY)
Admission: RE | Admit: 2023-05-21 | Discharge: 2023-05-21 | Disposition: A | Discharge status: Home / Self Care | Payer: Medicare Other | Source: Ambulatory Visit | Attending: Nephrology | Admitting: Nephrology

## 2023-05-21 VITALS — BP 142/50 | HR 51 | Temp 97.7°F | Resp 17

## 2023-05-21 DIAGNOSIS — N184 Chronic kidney disease, stage 4 (severe): Secondary | ICD-10-CM | POA: Diagnosis not present

## 2023-05-21 LAB — IRON AND TIBC
Iron: 86 ug/dL (ref 28–170)
Saturation Ratios: 29 % (ref 10.4–31.8)
TIBC: 301 ug/dL (ref 250–450)
UIBC: 215 ug/dL

## 2023-05-21 LAB — POCT HEMOGLOBIN-HEMACUE: Hemoglobin: 11.8 g/dL — ABNORMAL LOW (ref 12.0–15.0)

## 2023-05-21 LAB — FERRITIN: Ferritin: 88 ng/mL (ref 11–307)

## 2023-05-21 MED ORDER — EPOETIN ALFA-EPBX 10000 UNIT/ML IJ SOLN: 10000.0000 [IU] | INTRAMUSCULAR | Status: DC

## 2023-05-21 MED ORDER — EPOETIN ALFA-EPBX 10000 UNIT/ML IJ SOLN
INTRAMUSCULAR | Status: AC
  Administered 2023-05-21: 10000 [IU] via SUBCUTANEOUS
  Filled 2023-05-21: qty 1

## 2023-05-26 LAB — PROTEIN / CREATININE RATIO, URINE: Creatinine, Urine: 37

## 2023-06-03 ENCOUNTER — Other Ambulatory Visit (HOSPITAL_COMMUNITY): Payer: Self-pay | Admitting: *Deleted

## 2023-06-09 ENCOUNTER — Telehealth (INDEPENDENT_AMBULATORY_CARE_PROVIDER_SITE_OTHER): Payer: Medicare Other | Admitting: Podiatry

## 2023-06-09 ENCOUNTER — Other Ambulatory Visit (INDEPENDENT_AMBULATORY_CARE_PROVIDER_SITE_OTHER): Payer: Medicare Other | Admitting: Podiatry

## 2023-06-09 DIAGNOSIS — E119 Type 2 diabetes mellitus without complications: Secondary | ICD-10-CM

## 2023-06-09 DIAGNOSIS — L600 Ingrowing nail: Secondary | ICD-10-CM

## 2023-06-09 MED ORDER — GENTAMICIN SULFATE 0.1 % EX CREA
TOPICAL_CREAM | CUTANEOUS | 1 refills | Status: DC
Start: 2023-06-09 — End: 2023-09-01

## 2023-06-09 MED ORDER — DOXYCYCLINE HYCLATE 100 MG PO CAPS
100.0000 mg | ORAL_CAPSULE | Freq: Two times a day (BID) | ORAL | 0 refills | Status: AC
Start: 2023-06-09 — End: 2023-06-19

## 2023-06-09 NOTE — Telephone Encounter (Signed)
Pt called in requesting a Rx for Antibiotics for her foot.    Please advise

## 2023-06-09 NOTE — Telephone Encounter (Signed)
Returned patient's call regarding her request for painful left great toe. She had purchased a new pair of shoes which were tight and she felt they needed "breaking in". States it has been sore and she would like antibiotics. She has been soaking toe in epsom salt/warm water and applying Neosporin, but toe is still sore. Sent Rx in for Gentamicin Cream and doxycycline 100 mg po bid x 10 days. Informed patient she needs to consider having permanent matrixectomy performed if this continues to be a problem. Will schedule patient for follow up in 10 days after she has completed antibiotics. She is to call if condition worsens.

## 2023-06-09 NOTE — Progress Notes (Signed)
  1. Ingrown toenail without infection   2. Diabetes mellitus without complication (HCC)

## 2023-06-18 ENCOUNTER — Ambulatory Visit (HOSPITAL_COMMUNITY)
Admission: RE | Admit: 2023-06-18 | Discharge: 2023-06-18 | Disposition: A | Discharge status: Home / Self Care | Payer: Medicare Other | Source: Ambulatory Visit | Attending: Nephrology | Admitting: Nephrology

## 2023-06-18 VITALS — BP 133/45 | HR 51 | Temp 97.3°F | Resp 18

## 2023-06-18 DIAGNOSIS — N184 Chronic kidney disease, stage 4 (severe): Secondary | ICD-10-CM | POA: Insufficient documentation

## 2023-06-18 LAB — POCT HEMOGLOBIN-HEMACUE: Hemoglobin: 11.4 g/dL — ABNORMAL LOW (ref 12.0–15.0)

## 2023-06-18 MED ORDER — EPOETIN ALFA-EPBX 10000 UNIT/ML IJ SOLN
10000.0000 [IU] | INTRAMUSCULAR | Status: DC
  Administered 2023-06-18: 10000 [IU] via SUBCUTANEOUS

## 2023-06-18 MED ORDER — EPOETIN ALFA-EPBX 10000 UNIT/ML IJ SOLN
INTRAMUSCULAR | Status: AC
  Filled 2023-06-18: qty 1

## 2023-06-19 ENCOUNTER — Other Ambulatory Visit: Payer: Self-pay | Admitting: Family Medicine

## 2023-06-24 ENCOUNTER — Encounter: Payer: Self-pay | Admitting: Podiatry

## 2023-06-24 ENCOUNTER — Ambulatory Visit: Payer: Medicare Other | Admitting: Podiatry

## 2023-06-24 DIAGNOSIS — E119 Type 2 diabetes mellitus without complications: Secondary | ICD-10-CM | POA: Diagnosis not present

## 2023-06-24 DIAGNOSIS — L600 Ingrowing nail: Secondary | ICD-10-CM | POA: Diagnosis not present

## 2023-06-30 DIAGNOSIS — H40053 Ocular hypertension, bilateral: Secondary | ICD-10-CM | POA: Diagnosis not present

## 2023-06-30 LAB — HM DIABETES EYE EXAM

## 2023-07-04 NOTE — Progress Notes (Signed)
  Subjective:  Patient ID: Teresa Barnes, female    DOB: 04-21-1946,  MRN: [id number]  77 y.o. female presents for folllow up left great toe lateral border ingrown toenail. She has been performing epsom salt soaks, has completed oral antibiotics and applying Gentamicin Cream to digit. She relates toe is a little better, but has a little tenderness.  Chief Complaint  Patient presents with   Nail Problem    L great toe check    PCP is [name], MD , and last visit was February 28, 2023.  Allergies  Allergen Reactions   Prednisone Nausea And Vomiting   Tramadol Nausea Only    Review of Systems: Negative except as noted in the HPI.   Objective:  Teresa Barnes is a pleasant 77 y.o. female WD, WN in NAD.Marland Kitchen [name] x 3.  Vascular Examination: Vascular status intact b/l with palpable pedal pulses. CFT immediate b/l. Pedal hair present. No edema. No pain with calf compression b/l. Skin temperature gradient WNL b/l. No varicosities noted. No cyanosis or clubbing noted.  Neurological Examination: Sensation grossly intact b/l with 10 gram monofilament. Vibratory sensation intact b/l.  Dermatological Examination: Pedal skin with normal turgor, texture and tone b/l. No open wounds nor interdigital macerations noted. Residual nail spicule noted lateral border left hallux. No hyperkeratotic lesions noted b/l.   Musculoskeletal Examination: Muscle strength 5/5 to b/l LE.  No pain, crepitus noted b/l. No gross pedal deformities. Patient ambulates independently without assistive aids.   Radiographs: None Last A1c:      Latest Ref Rng & Units 02/28/2023   10:17 AM 08/15/2022   10:40 AM  Hemoglobin A1C  Hemoglobin-A1c 4.0 - 5.6 % 5.5  6.0     Assessment:   1. Ingrown toenail without infection   2. Diabetes mellitus without complication (HCC)    Plan:  -Consent given for treatment as described below: -Examined patient. -No invasive procedure(s) performed. Offending nail border debrided  and curretaged lateral border left hallux utilizing sterile nail nipper and currette. Border(s) cleansed with alcohol and triple antibiotic ointment applied. Continue once daily epsom salt soaks. Call office if there are any concerns. -Patient/POA to call should there be question/concern in the interim.  Return in about 3 weeks (around 07/15/2023), or follow up with Dr. Lilian Kapur for chronic ingrown toenail lateral border left great toe. She will see 9 weeks after visit with Dr. Lilian Kapur.  Freddie Breech, DPM

## 2023-07-15 ENCOUNTER — Ambulatory Visit: Payer: Medicare Other | Admitting: Podiatry

## 2023-07-16 ENCOUNTER — Ambulatory Visit: Payer: Medicare Other | Admitting: Podiatry

## 2023-07-16 ENCOUNTER — Encounter (HOSPITAL_COMMUNITY): Payer: Medicare Other

## 2023-07-28 ENCOUNTER — Other Ambulatory Visit: Payer: Self-pay | Admitting: Family Medicine

## 2023-07-28 ENCOUNTER — Ambulatory Visit: Payer: Medicare Other

## 2023-07-29 ENCOUNTER — Ambulatory Visit: Payer: Medicare Other | Admitting: Podiatry

## 2023-08-01 ENCOUNTER — Other Ambulatory Visit (HOSPITAL_BASED_OUTPATIENT_CLINIC_OR_DEPARTMENT_OTHER): Payer: Self-pay

## 2023-08-01 MED ORDER — COVID-19 MRNA VAC-TRIS(PFIZER) 30 MCG/0.3ML IM SUSY
0.3000 mL | PREFILLED_SYRINGE | Freq: Once | INTRAMUSCULAR | 0 refills | Status: AC
  Filled 2023-08-01: qty 0.3, 1d supply, fill #0

## 2023-08-07 ENCOUNTER — Ambulatory Visit: Payer: Medicare Other | Admitting: Podiatry

## 2023-08-07 ENCOUNTER — Encounter: Payer: Self-pay | Admitting: Podiatry

## 2023-08-07 VITALS — BP 142/80 | HR 60 | Temp 97.6°F | Resp 18 | Ht 64.0 in | Wt 170.0 lb

## 2023-08-07 DIAGNOSIS — L600 Ingrowing nail: Secondary | ICD-10-CM | POA: Diagnosis not present

## 2023-08-07 MED ORDER — NEOMYCIN-POLYMYXIN-HC 3.5-10000-1 OT SUSP: OTIC | 0 refills | Status: DC

## 2023-08-07 NOTE — Patient Instructions (Signed)

## 2023-08-08 NOTE — Progress Notes (Signed)
Subjective:  Patient ID: Teresa Barnes, female    DOB: 06-24-1946,  MRN: [id number]  Chief Complaint  Patient presents with   Ingrown Toenail    Patient is here for ingrown of great left toe    77 y.o. female presents with the above complaint. History confirmed with patient.  Ankle has been very painful.  Dr. Eloy End sees her to debride the ingrown portion, this does help but it becomes painful again before this.  She is here today for permanent treatment of the ingrown nail.  Her A1c is well-controlled.  Objective:  Physical Exam: warm, good capillary refill, no trophic changes or ulcerative lesions, normal DP and PT pulses, normal sensory exam, and ingrown left hallux lateral border no paronychia.  Assessment:   1. Ingrowing left great toenail      Plan:  Patient was evaluated and treated and all questions answered.    Ingrown Nail, left -Patient elects to proceed with minor surgery to remove ingrown toenail today. Consent reviewed and signed by patient. -Ingrown nail excised. See procedure note. -Educated on post-procedure care including soaking. Written instructions provided and reviewed. -Rx for Cortisporin sent to pharmacy. -Advised on signs and symptoms of infection developing.  We discussed that the phenol likely will create some redness and edema and tenderness around the nailbed as long as it is localized this is to be expected.  Will return as needed if any infection signs develop  Procedure: Excision of Ingrown Toenail Location: Left 1st toe lateral nail borders. Anesthesia: Lidocaine 1% plain; 1.5 mL and Marcaine 0.5% plain; 1.5 mL, digital block. Skin Prep: Betadine. Dressing: Silvadene; telfa; dry, sterile, compression dressing. Technique: Following skin prep, the toe was exsanguinated and a tourniquet was secured at the base of the toe. The affected nail border was freed, split with a nail splitter, and excised. Chemical matrixectomy was then performed with  phenol and irrigated out with alcohol. The tourniquet was then removed and sterile dressing applied. Disposition: Patient tolerated procedure well.    Return if symptoms worsen or fail to improve.

## 2023-09-01 ENCOUNTER — Encounter: Payer: Self-pay | Admitting: Family Medicine

## 2023-09-01 ENCOUNTER — Ambulatory Visit: Payer: Medicare Other | Admitting: Family Medicine

## 2023-09-01 VITALS — BP 110/70 | HR 52 | Temp 98.3°F | Ht 64.0 in | Wt 166.4 lb

## 2023-09-01 DIAGNOSIS — E1159 Type 2 diabetes mellitus with other circulatory complications: Secondary | ICD-10-CM

## 2023-09-01 DIAGNOSIS — E1122 Type 2 diabetes mellitus with diabetic chronic kidney disease: Secondary | ICD-10-CM

## 2023-09-01 DIAGNOSIS — Z23 Encounter for immunization: Secondary | ICD-10-CM | POA: Diagnosis not present

## 2023-09-01 DIAGNOSIS — I152 Hypertension secondary to endocrine disorders: Secondary | ICD-10-CM

## 2023-09-01 DIAGNOSIS — N184 Chronic kidney disease, stage 4 (severe): Secondary | ICD-10-CM | POA: Diagnosis not present

## 2023-09-01 LAB — POCT GLYCOSYLATED HEMOGLOBIN (HGB A1C): Hemoglobin A1C: 5.6 % (ref 4.0–5.6)

## 2023-09-01 MED ORDER — ROSUVASTATIN CALCIUM 20 MG PO TABS: 20.0000 mg | ORAL_TABLET | Freq: Every day | ORAL | 3 refills | Status: DC

## 2023-09-01 NOTE — Progress Notes (Signed)
Subjective  CC:  Chief Complaint  Patient presents with   Diabetes   Hypertension    Dexa is scheduled for 10/05/2023    HPI: Teresa Barnes is a 77 y.o. female who presents to the office today for follow up of diabetes and problems listed above in the chief complaint.  Diabetes follow up: Her diabetic control is reported as Unchanged. Doing well. Feeling well. No concerns with meds.  She denies exertional CP or SOB or symptomatic hypoglycemia. She denies foot sores or paresthesias.  CKD: reviewed renal notes from July 2024. Stable gfr. Feelign well. Tolerating ozempic.  HTN: has been fairly well controlled. Today looks good. Bradycardic. On losartan 50  Wt Readings from Last 3 Encounters:  09/01/23 166 lb 6.4 oz (75.5 kg)  08/07/23 170 lb (77.1 kg)  02/28/23 168 lb 3.2 oz (76.3 kg)    BP Readings from Last 3 Encounters:  09/01/23 110/70  08/07/23 (!) 142/80  06/18/23 (!) 133/45    Assessment  1. Type 2 diabetes mellitus with stage 4 chronic kidney disease, without long-term current use of insulin (HCC)   2. Need for influenza vaccination   3. Hypertension associated with diabetes (HCC)   4. CKD (chronic kidney disease) stage 4, GFR 15-29 ml/min (HCC)      Plan  Diabetes is currently very well controlled. No changes needed Ckd is stable. She is feeling well. Bp is controlled. Avoiding nephrotoxins. Not needed lasix. Edema is stable.  HTN is controlled on arb Flu shot updated.  Follow up: 6 mo for cpe Orders Placed This Encounter  Procedures   Flu Vaccine Trivalent High Dose (Fluad)   POCT HgB A1C   Meds ordered this encounter  Medications   rosuvastatin (CRESTOR) 20 MG tablet    Sig: Take 1 tablet (20 mg total) by mouth at bedtime.    Dispense:  90 tablet    Refill:  3      Immunization History  Administered Date(s) Administered   Fluad Quad(high Dose 65+) 08/18/2019, 08/06/2021, 08/15/2022   Fluad Trivalent(High Dose 65+) 09/01/2023   Influenza, High Dose  Seasonal PF 08/18/2017, 07/15/2018, 08/14/2020   Influenza,inj,Quad PF,6+ Mos 08/01/2016   Influenza-Unspecified 08/18/2014   Moderna Covid-19 Vaccine Bivalent Booster 81yrs & up 08/16/2021   Moderna Sars-Covid-2 Vaccination 01/01/2020, 01/29/2020, 09/25/2020   Pfizer(Comirnaty)Fall Seasonal Vaccine 12 years and older 08/01/2023   Pneumococcal Conjugate-13 12/09/2017   Pneumococcal Polysaccharide-23 09/24/2018   Zoster Recombinant(Shingrix) 10/21/2018, 01/27/2019    Diabetes Related Lab Review: Lab Results  Component Value Date   HGBA1C 5.6 09/01/2023   HGBA1C 5.5 02/28/2023   HGBA1C 6.0 (A) 08/15/2022    Lab Results  Component Value Date   MICROALBUR 4.9 (H) 05/15/2022   Lab Results  Component Value Date   CREATININE 1.83 (H) 02/28/2023   BUN 58 (H) 02/28/2023   NA 137 02/28/2023   K 4.3 02/28/2023   CL 104 02/28/2023   CO2 24 02/28/2023   Lab Results  Component Value Date   CHOL 156 02/28/2023   CHOL 143 05/15/2022   CHOL 150 04/12/2021   Lab Results  Component Value Date   HDL 58.30 02/28/2023   HDL 60.10 05/15/2022   HDL 57.50 04/12/2021   Lab Results  Component Value Date   LDLCALC 81 02/28/2023   LDLCALC 71 05/15/2022   LDLCALC 83 04/12/2021   Lab Results  Component Value Date   TRIG 80.0 02/28/2023   TRIG 61.0 05/15/2022   TRIG 49.0 04/12/2021  Lab Results  Component Value Date   CHOLHDL 3 02/28/2023   CHOLHDL 2 05/15/2022   CHOLHDL 3 04/12/2021   No results found for: "LDLDIRECT" The 10-year ASCVD risk score (Arnett DK, et al., 2019) is: 28.4%   Values used to calculate the score:     Age: 22 years     Sex: Female     Is Non-Hispanic African American: Yes     Diabetic: Yes     Tobacco smoker: No     Systolic Blood Pressure: 110 mmHg     Is BP treated: Yes     HDL Cholesterol: 58.3 mg/dL     Total Cholesterol: 156 mg/dL I have reviewed the PMH, Fam and Soc history. Patient Active Problem List   Diagnosis Date Noted Date Diagnosed    Type 2 diabetes mellitus with stage 4 chronic kidney disease, without long-term current use of insulin (HCC) 08/09/2020     Priority: High   CKD (chronic kidney disease) stage 4, GFR 15-29 ml/min (HCC) 05/31/2020     Priority: High    Normal ultrasound, 07/2020; referred to renal 05/2020    Mixed hyperlipidemia 12/09/2017     Priority: High   Hypertension associated with diabetes (HCC) 12/09/2017     Priority: High   H/O: upper GI bleed 07/30/2016     Priority: High    Due to severe erosive esophagitis; 2017; healed with PPI by egd f/u; has HH and stricture; s/p blood transfusion. Negative for Barretts esophagitis    Lower extremity edema 08/15/2022     Priority: Medium     Ckd; intermittent hctz and compression hose    S/P cervical spinal fusion 03/26/2019     Priority: Medium     03/2019 severe spinal stenosis and cervical myelopathy    Anemia of chronic disease 09/24/2018     Priority: Medium     Renal; epocrit    History of right breast cancer 12/09/2017     Priority: Medium     S/p mastectomy 1990    Hiatal hernia 12/09/2017     Priority: Medium    Esophageal stricture 12/09/2017     Priority: Medium     S/p dilation by GI: Dr. Yancey Flemings; due to h/o severe recurrent erosive esophagitis.    Chronic gout 12/09/2017     Priority: Medium     On allopurinol    Glaucoma 12/09/2017     Priority: Medium    Degenerative arthritis of right knee 07/03/2017     Priority: Medium     Injected 07/03/2017' Injected August 05, 2018    Right lumbar radiculopathy 01/23/2016     Priority: Medium    Arthritis of right acromioclavicular joint 01/02/2016     Priority: Medium     Injected 01/02/2016 Repeat injection 03/12/2016    Greater trochanteric bursitis of right hip 11/17/2017     Priority: Low    Injected November 17, 2017 Repeat injection May 05, 2018    Acute bursitis of right shoulder 06/03/2019     Injected in June 03, 2019    Adhesive bursitis of left shoulder  08/05/2018     Injected August 05, 2018    Acquired involutional ptosis of eyelid 06/25/2012     Social History: Patient  reports that she quit smoking about 30 years ago. Her smoking use included cigarettes. She started smoking about 40 years ago. She has a 2.5 pack-year smoking history. She has never used smokeless tobacco. She reports that she does  not drink alcohol and does not use drugs.  Review of Systems: Ophthalmic: negative for eye pain, loss of vision or double vision Cardiovascular: negative for chest pain Respiratory: negative for SOB or persistent cough Gastrointestinal: negative for abdominal pain Genitourinary: negative for dysuria or gross hematuria MSK: negative for foot lesions Neurologic: negative for weakness or gait disturbance  Objective  Vitals: BP 110/70   Pulse (!) 52   Temp 98.3 F (36.8 C)   Ht 5\' 4"  (1.626 m)   Wt 166 lb 6.4 oz (75.5 kg)   SpO2 98%   BMI 28.56 kg/m  General: well appearing, no acute distress  Psych:  Alert and oriented, normal mood and affect HEENT:  Normocephalic, atraumatic, moist mucous membranes, supple neck  Cardiovascular:  Nl S1 and S2, RRR without murmur, gallop or rub. no edema Respiratory:  Good breath sounds bilaterally, CTAB with normal effort, no rales Gastrointestinal: normal BS, soft, nontender Skin:  Warm, no rashes Neurologic:   Mental status is normal. normal gait Foot exam: no erythema, pallor, or cyanosis visible nl proprioception and sensation to monofilament testing bilaterally, +2 distal pulses bilaterally    Diabetic education: ongoing education regarding chronic disease management for diabetes was given today. We continue to reinforce the ABC's of diabetic management: A1c (<7 or 8 dependent upon patient), tight blood pressure control, and cholesterol management with goal LDL < 100 minimally. We discuss diet strategies, exercise recommendations, medication options and possible side effects. At each visit,  we review recommended immunizations and preventive care recommendations for diabetics and stress that good diabetic control can prevent other problems. See below for this patient's data.   Commons side effects, risks, benefits, and alternatives for medications and treatment plan prescribed today were discussed, and the patient expressed understanding of the given instructions. Patient is instructed to call or message via MyChart if he/she has any questions or concerns regarding our treatment plan. No barriers to understanding were identified. We discussed Red Flag symptoms and signs in detail. Patient expressed understanding regarding what to do in case of urgent or emergency type symptoms.  Medication list was reconciled, printed and provided to the patient in AVS. Patient instructions and summary information was reviewed with the patient as documented in the AVS. This note was prepared with assistance of Dragon voice recognition software. Occasional wrong-word or sound-a-like substitutions may have occurred due to the inherent limitations of voice recognition software

## 2023-09-04 ENCOUNTER — Ambulatory Visit: Payer: Medicare Other

## 2023-09-17 ENCOUNTER — Ambulatory Visit: Payer: Medicare Other | Admitting: Podiatry

## 2023-09-17 ENCOUNTER — Encounter: Payer: Self-pay | Admitting: Podiatry

## 2023-09-17 DIAGNOSIS — M79675 Pain in left toe(s): Secondary | ICD-10-CM

## 2023-09-17 DIAGNOSIS — B351 Tinea unguium: Secondary | ICD-10-CM | POA: Diagnosis not present

## 2023-09-17 DIAGNOSIS — Z9889 Other specified postprocedural states: Secondary | ICD-10-CM | POA: Diagnosis not present

## 2023-09-17 DIAGNOSIS — E119 Type 2 diabetes mellitus without complications: Secondary | ICD-10-CM

## 2023-09-17 DIAGNOSIS — M79674 Pain in right toe(s): Secondary | ICD-10-CM

## 2023-09-21 NOTE — Progress Notes (Signed)
  Subjective:  Patient ID: Teresa Barnes, female    DOB: [birth year],  MRN: [id number]  Teresa Barnes presents to clinic today for preventative diabetic foot care and painful, discolored, thick toenails which interfere with daily activities. Patient had matrixectomy performed in September. States she is not sure it has healed and she is unable to remove scab even after soaking. Denies any redness, drainage or swelling. Although somewhat tender, she has had improvement in pain score when compared to her ingrown toenail. Chief Complaint  Patient presents with   Diabetes    [facility] PT also wants a f/u to ingrown toenail removal in Sept by Dr Lilian Kapur    New problem(s): None.   PCP is [name], MD.  Allergies  Allergen Reactions   Prednisone Nausea And Vomiting   Tramadol Nausea Only    Review of Systems: Negative except as noted in the HPI.  Objective: No changes noted in today's physical examination. There were no vitals filed for this visit. Teresa Barnes is a pleasant 77 y.o. female WD, WN in NAD. [name] x 3.  Vascular Examination: Vascular status intact b/l with palpable pedal pulses. CFT immediate b/l. Pedal hair present. No edema. No pain with calf compression b/l. Skin temperature gradient WNL b/l. No varicosities noted. No cyanosis or clubbing noted.  Neurological Examination: Sensation grossly intact b/l with 10 gram monofilament. Vibratory sensation intact b/l.  Dermatological Examination: Pedal skin with normal turgor, texture and tone b/l. No interdigital macerations noted.   Toenails 2-5 bilaterally and R hallux elongated, discolored, dystrophic, thickened, and crumbly with subungual debris and tenderness to dorsal palpation.   Evidence of partial matrixectomy lateral border left hallux. Dried hemorrhagic scab noted on border. Post-debridement, there is nice, granular tissue. No purulence, no odor, no erythema.  Musculoskeletal Examination: Muscle strength 5/5 to b/l  LE.  No pain, crepitus noted b/l. No gross pedal deformities. Patient ambulates independently without assistive aids.   Radiographs: None  Assessment/Plan: 1. Pain due to onychomycosis of toenails of both feet   2. Status post nail surgery   3. Diabetes mellitus without complication (HCC)    -Consent given for treatment as described below: -Examined patient. -Lateral border left hallux debrided. Cleansed with hydrogen peroxide and alcohol. TAO and dressing applied. She is to continue to soak once daily for one week and apply her Cortisporin Drops. After one week of epsom salt soaks, change to once daily application of Cortisporin Drops. -Patient to continue soft, supportive shoe gear daily. -Toenails were debrided in length and girth 2-5 bilaterally and right great toe with sterile nail nippers and dremel without iatrogenic bleeding.  Patient to follow up with Dr. Lilian Kapur in 3 weeks for toe check. -Patient/POA to call should there be question/concern in the interim.   Return in about 3 months (around 12/18/2023).  Freddie Breech, DPM

## 2023-10-01 DIAGNOSIS — D638 Anemia in other chronic diseases classified elsewhere: Secondary | ICD-10-CM | POA: Diagnosis not present

## 2023-10-01 DIAGNOSIS — E1122 Type 2 diabetes mellitus with diabetic chronic kidney disease: Secondary | ICD-10-CM | POA: Diagnosis not present

## 2023-10-01 DIAGNOSIS — I129 Hypertensive chronic kidney disease with stage 1 through stage 4 chronic kidney disease, or unspecified chronic kidney disease: Secondary | ICD-10-CM | POA: Diagnosis not present

## 2023-10-01 DIAGNOSIS — N2581 Secondary hyperparathyroidism of renal origin: Secondary | ICD-10-CM | POA: Diagnosis not present

## 2023-10-01 DIAGNOSIS — R3129 Other microscopic hematuria: Secondary | ICD-10-CM | POA: Diagnosis not present

## 2023-10-01 DIAGNOSIS — K2210 Ulcer of esophagus without bleeding: Secondary | ICD-10-CM | POA: Diagnosis not present

## 2023-10-01 DIAGNOSIS — N184 Chronic kidney disease, stage 4 (severe): Secondary | ICD-10-CM | POA: Diagnosis not present

## 2023-10-09 ENCOUNTER — Ambulatory Visit: Payer: Medicare Other | Admitting: Podiatry

## 2023-10-09 ENCOUNTER — Encounter: Payer: Self-pay | Admitting: Podiatry

## 2023-10-09 VITALS — Ht 64.0 in | Wt 166.0 lb

## 2023-10-09 DIAGNOSIS — L600 Ingrowing nail: Secondary | ICD-10-CM | POA: Diagnosis not present

## 2023-10-09 NOTE — Progress Notes (Signed)
  Subjective:  Patient ID: Teresa Barnes, female    DOB: [birth year],  MRN: [id number]  Chief Complaint  Patient presents with   Ingrown Toenail    Patient is here for 3 wk f/u for ingrown nail foot looks good healing well, no pain    77 y.o. female presents with the above complaint. History confirmed with patient.  Doing well there is still some scab  Objective:  Physical Exam: warm, good capillary refill, no trophic changes or ulcerative lesions, normal DP and PT pulses, normal sensory exam, and left hallux lateral border matricectomy is healing well small scab left no active signs of infection or nonhealing.  Assessment:   1. Ingrowing left great toenail      Plan:  Patient was evaluated and treated and all questions answered.  She presents today for follow-up on her ingrown nail procedure.  Appears to be healing fine there is some small scab left I debrided this gently.  She may leave open to air at this point discontinue soaks and ointment but during bathing may wash away some of the scab to see if this allows further healing.  Follow-up with me as needed I advised her that should be healed completely by the first of the year.    Return if symptoms worsen or fail to improve.

## 2023-10-14 ENCOUNTER — Other Ambulatory Visit: Payer: Self-pay | Admitting: Family Medicine

## 2023-10-22 ENCOUNTER — Ambulatory Visit (HOSPITAL_COMMUNITY)
Admission: RE | Admit: 2023-10-22 | Discharge: 2023-10-22 | Disposition: A | Discharge status: Home / Self Care | Payer: Medicare Other | Source: Ambulatory Visit | Attending: Nephrology | Admitting: Nephrology

## 2023-10-22 VITALS — BP 134/37 | HR 52 | Temp 97.4°F | Resp 17

## 2023-10-22 DIAGNOSIS — N184 Chronic kidney disease, stage 4 (severe): Secondary | ICD-10-CM | POA: Insufficient documentation

## 2023-10-22 MED ORDER — EPOETIN ALFA-EPBX 10000 UNIT/ML IJ SOLN
INTRAMUSCULAR | Status: AC
  Administered 2023-10-22: 10000 [IU] via SUBCUTANEOUS
  Filled 2023-10-22: qty 1

## 2023-10-22 MED ORDER — EPOETIN ALFA-EPBX 10000 UNIT/ML IJ SOLN
10000.0000 [IU] | INTRAMUSCULAR | Status: DC
Start: 2023-10-22 — End: 2024-11-17

## 2023-10-23 LAB — POCT HEMOGLOBIN-HEMACUE: Hemoglobin: 9 g/dL — ABNORMAL LOW (ref 12.0–15.0)

## 2023-10-29 ENCOUNTER — Ambulatory Visit: Payer: Medicare Other

## 2023-10-29 VITALS — Wt 166.0 lb

## 2023-10-29 DIAGNOSIS — Z0000 Encounter for general adult medical examination without abnormal findings: Secondary | ICD-10-CM | POA: Diagnosis not present

## 2023-10-29 NOTE — Progress Notes (Signed)
Subjective:   Teresa Barnes is a 77 y.o. female who presents for Medicare Annual (Subsequent) preventive examination.  Visit Complete: Virtual I connected with  Teresa Barnes on 10/29/23 by a audio enabled telemedicine application and verified that I am speaking with the correct person using two identifiers.  Patient Location: Home  Provider Location: Home Office  I discussed the limitations of evaluation and management by telemedicine. The patient expressed understanding and agreed to proceed.  Vital Signs: Because this visit was a virtual/telehealth visit, some criteria may be missing or patient reported. Any vitals not documented were not able to be obtained and vitals that have been documented are patient reported.  Cardiac Risk Factors include: advanced age (>64men, >54 women);hypertension;diabetes mellitus;dyslipidemia     Objective:    Today's Vitals   10/29/23 1338  Weight: 166 lb (75.3 kg)   Body mass index is 28.49 kg/m.     10/29/2023    1:43 PM 10/22/2022   11:48 AM 10/18/2021    1:18 PM 08/10/2021    1:22 PM 11/21/2020    2:13 PM 09/21/2020    1:16 PM 07/19/2019    3:34 PM  Advanced Directives  Does Patient Have a Medical Advance Directive? No No No Yes No No No  Type of Advance Directive    Living will     Does patient want to make changes to medical advance directive?      No - Patient declined   Would patient like information on creating a medical advance directive? No - Patient declined No - Patient declined No - Patient declined  No - Patient declined  No - Patient declined    Current Medications (verified) Outpatient Encounter Medications as of 10/29/2023  Medication Sig   dapagliflozin propanediol (FARXIGA) 10 MG TABS tablet Take 1 tablet by mouth once daily   dorzolamide-timolol (COSOPT) 22.3-6.8 MG/ML ophthalmic solution Place 1 drop into both eyes 2 (two) times daily.   furosemide (LASIX) 40 MG tablet Take 40 mg by mouth daily as needed.    latanoprost (XALATAN) 0.005 % ophthalmic solution Place 1 drop into both eyes at bedtime.   losartan (COZAAR) 50 MG tablet Take 50 mg by mouth daily.   omeprazole (PRILOSEC) 20 MG capsule Take 20 mg by mouth daily.   OZEMPIC, 0.25 OR 0.5 MG/DOSE, 2 MG/3ML SOPN INJECT 0.5MG  INTO THE SKIN ONCE A WEEK   rosuvastatin (CRESTOR) 20 MG tablet Take 1 tablet (20 mg total) by mouth at bedtime.   vitamin B-12 (CYANOCOBALAMIN) 500 MCG tablet Take 500 mcg by mouth every other day.   Vitamin D, Cholecalciferol, 25 MCG (1000 UT) TABS Take 1 tablet by mouth daily.   [DISCONTINUED] cetirizine (ZYRTEC) 10 MG tablet Take 1 tablet (10 mg total) by mouth daily.   [DISCONTINUED] neomycin-polymyxin-hydrocortisone (CORTISPORIN) 3.5-10000-1 OTIC suspension Apply 1-2 drops daily after soaking and cover with bandaid   No facility-administered encounter medications on file as of 10/29/2023.    Allergies (verified) Prednisone and Tramadol   History: Past Medical History:  Diagnosis Date   Anemia    Anemia of chronic disease 09/24/2018   Anxiety    Blood transfusion without reported diagnosis 07/2016   Cancer (HCC) 1991   breast   Chronic gout 12/09/2017   On allopurinol   Diabetes mellitus without complication (HCC)    Esophageal stricture 12/09/2017   S/p dilation by GI: Dr. Yancey Flemings; due to h/o severe recurrent erosive esophagitis.   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  Injected left foot every 19 2018   GERD (gastroesophageal reflux disease)    Glaucoma    Hiatal hernia 12/09/2017   Hyperlipidemia    Hypertension    Right rotator cuff tear 11/24/2015   Injected this and 26 2017   Wears glasses    Past Surgical History:  Procedure Laterality Date   ANTERIOR CERVICAL DECOMP/DISCECTOMY FUSION N/A 03/26/2019   Procedure: Anterior Cervical Discectomy Fusion - Cervical Three-Cervical Four - Cervical Four-Cervical Five - Cervical Five-Cervical Six;  Surgeon: Tia Alert, MD;  Location: [facility] OR;  Service:  Neurosurgery;  Laterality: N/A;  Anterior Cervical Discectomy Fusion - Cervical Three-Cervical Four - Cervical Four-Cervical Five - Cervical Five-Cervical Six   CATARACT EXTRACTION W/ INTRAOCULAR LENS  IMPLANT, BILATERAL  2000   COLONOSCOPY     cyst on spine     ESOPHAGOGASTRODUODENOSCOPY (EGD) WITH PROPOFOL N/A 07/31/2016   Procedure: ESOPHAGOGASTRODUODENOSCOPY (EGD) WITH PROPOFOL;  Surgeon: Ruffin Frederick, MD;  Location: [facility];  Service: Gastroenterology;  Laterality: N/A;   EYE SURGERY  2011   lt eye blled   MASTECTOMY MODIFIED RADICAL Right 1990   REDUCTION MAMMAPLASTY Left    1990   TRIGGER FINGER RELEASE Right 06/24/2013   Procedure: RELEASE TRIGGER FINGER/A-1 PULLEY PIP RIGHT LONG FINGER;  Surgeon: Wyn Forster., MD;  Location: [facility];  Service: Orthopedics;  Laterality: Right;   TRIGGER FINGER RELEASE     TUBAL LIGATION     Family History  Problem Relation Age of Onset   Diabetes Mother    Diabetes Daughter    Arthritis Daughter    Hyperlipidemia Daughter    Diabetes Son    Arthritis Son    Diabetes Sister    Colon cancer Neg Hx    Cancer Neg Hx    Heart disease Neg Hx    Breast cancer Neg Hx    Stomach cancer Neg Hx    Pancreatic cancer Neg Hx    Esophageal cancer Neg Hx    Liver disease Neg Hx    Social History   Socioeconomic History   Marital status: Married    Spouse name: robert   Number of children: 2   Years of education: Not on file   Highest education level: Not on file  Occupational History   Occupation: retired Public librarian; logistician    Comment: 2006  Tobacco Use   Smoking status: Former    Current packs/day: 0.00    Average packs/day: 0.3 packs/day for 10.0 years (2.5 ttl pk-yrs)    Types: Cigarettes    Start date: 06/19/1983    Quit date: 06/18/1993    Years since quitting: 30.3   Smokeless tobacco: Never  Vaping Use   Vaping status: Never Used  Substance and Sexual Activity   Alcohol use: No    Drug use: No   Sexual activity: Never  Other Topics Concern   Not on file  Social History Narrative   Originally from [place], [place]; 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; longevity in family   Social Determinants of Health   Financial Resource Strain: Low Risk  (10/29/2023)   Overall Financial Resource Strain (CARDIA)    Difficulty of Paying Living Expenses: Not hard at all  Food Insecurity: No Food Insecurity (10/29/2023)   Hunger Vital Sign    Worried About Running Out of Food in the Last Year: Never true    Ran Out of Food in the Last Year: Never true  Transportation Needs: No Transportation Needs (  10/29/2023)   PRAPARE - Administrator, Civil Service (Medical): No    Lack of Transportation (Non-Medical): No  Physical Activity: Inactive (10/29/2023)   Exercise Vital Sign    Days of Exercise per Week: 0 days    Minutes of Exercise per Session: 0 min  Stress: No Stress Concern Present (10/29/2023)   [employer] of Occupational Health - Occupational Stress Questionnaire    Feeling of Stress : Not at all  Social Connections: Moderately Integrated (10/29/2023)   Social Connection and Isolation Panel [NHANES]    Frequency of Communication with Friends and Family: More than three times a week    Frequency of Social Gatherings with Friends and Family: More than three times a week    Attends Religious Services: More than 4 times per year    Active Member of [employer] or Organizations: No    Attends Engineer, structural: Never    Marital Status: Married    Tobacco Counseling Counseling given: Not Answered   Clinical Intake:  Pre-visit preparation completed: Yes  Pain : No/denies pain     BMI - recorded: 28.49 Nutritional Status: BMI 25 -29 Overweight Nutritional Risks: None Diabetes: Yes CBG done?: No Did pt. bring in CBG monitor from home?: No  How often do you need to have someone help you when you read instructions, pamphlets, or other written materials  from your doctor or pharmacy?: 1 - Never  Interpreter Needed?: No  Information entered by :: Lanier Ensign, LPN   Activities of Daily Living    10/29/2023    1:39 PM  In your present state of health, do you have any difficulty performing the following activities:  Hearing? 0  Vision? 0  Difficulty concentrating or making decisions? 0  Walking or climbing stairs? 1  Comment avoid  Dressing or bathing? 0  Doing errands, shopping? 0  Preparing Food and eating ? N  Using the Toilet? N  In the past six months, have you accidently leaked urine? N  Do you have problems with loss of bowel control? N  Managing your Medications? N  Managing your Finances? N  Housekeeping or managing your Housekeeping? N    Patient Care Team: [name], MD as PCP - General (Family Medicine) Judi Saa, DO as Consulting Physician (Family Medicine) Hilarie Fredrickson, MD as Consulting Physician (Gastroenterology) Sherrie George, MD as Consulting Physician (Ophthalmology) Freddie Breech, DPM as Consulting Physician (Podiatry) Arman Bogus, MD as Consulting Physician (Neurosurgery) Roger Kill, NP as Nurse Practitioner (Nephrology)  Indicate any recent Medical Services you may have received from other than [facility] providers in the past year (date may be approximate).     Assessment:   This is a routine wellness examination for Luzma.  Hearing/Vision screen Hearing Screening - Comments:: Pt denies any hearing issues  Vision Screening - Comments:: Pt follows up with Dr Genia Del for annual eye exams   Goals Addressed             This Visit's Progress    Patient Stated       Get rid of walker        Depression Screen    10/29/2023    1:42 PM 09/01/2023   11:36 AM 02/28/2023   10:12 AM 10/22/2022   11:53 AM 10/08/2022    3:57 PM 08/15/2022   10:30 AM 05/15/2022    1:48 PM  PHQ 2/9 Scores  PHQ - 2 Score 0 0 0  0 0 0 0    Fall Risk    10/29/2023    1:44 PM  09/01/2023   11:35 AM 02/28/2023   10:12 AM 10/22/2022   11:50 AM 08/15/2022   10:30 AM  Fall Risk   Falls in the past year? 0 0 0 0 0  Number falls in past yr: 0 0 0 0 0  Injury with Fall? 0 0 0 0 0  Risk for fall due to : Impaired balance/gait No Fall Risks No Fall Risks Impaired vision;Impaired balance/gait No Fall Risks  Risk for fall due to: Comment    uses walker   Follow up Falls prevention discussed Falls evaluation completed Falls evaluation completed Falls prevention discussed Falls evaluation completed    MEDICARE RISK AT HOME: Medicare Risk at Home Any stairs in or around the home?: Yes If so, are there any without handrails?: No Home free of loose throw rugs in walkways, pet beds, electrical cords, etc?: Yes Adequate lighting in your home to reduce risk of falls?: Yes Life alert?: No Use of a cane, walker or w/c?: Yes Grab bars in the bathroom?: Yes Shower chair or bench in shower?: Yes Elevated toilet seat or a handicapped toilet?: Yes  TIMED UP AND GO:  Was the test performed?  No    Cognitive Function:DECLINED    10/29/2023    2:02 PM 07/15/2018    1:17 PM  MMSE - Mini Mental State Exam  Not completed: Refused   Orientation to time  5  Orientation to Place  5  Registration  3  Attention/ Calculation  5  Recall  0  Language- name 2 objects  2  Language- repeat  1  Language- follow 3 step command  3  Language- read & follow direction  1  Write a sentence  1  Copy design  1  Total score  27        10/22/2022   11:52 AM 10/18/2021    1:23 PM 09/21/2020    1:24 PM  6CIT Screen  What Year? 0 points 0 points 0 points  What month? 0 points 0 points 0 points  What time?  0 points   Count back from 20  0 points 0 points  Months in reverse  0 points 0 points  Repeat phrase  0 points 0 points  Total Score  0 points     Immunizations Immunization History  Administered Date(s) Administered   Fluad Quad(high Dose 65+) 08/18/2019, 08/06/2021, 08/15/2022    Fluad Trivalent(High Dose 65+) 09/01/2023   Influenza, High Dose Seasonal PF 08/18/2017, 07/15/2018, 08/14/2020   Influenza,inj,Quad PF,6+ Mos 08/01/2016   Influenza-Unspecified 08/18/2014   Moderna Covid-19 Vaccine Bivalent Booster 88yrs & up 08/16/2021   Moderna Sars-Covid-2 Vaccination 01/01/2020, 01/29/2020, 09/25/2020   Pfizer(Comirnaty)Fall Seasonal Vaccine 12 years and older 08/01/2023   Pneumococcal Conjugate-13 12/09/2017   Pneumococcal Polysaccharide-23 09/24/2018   Zoster Recombinant(Shingrix) 10/21/2018, 01/27/2019      Flu Vaccine status: Up to date  Pneumococcal vaccine status: Up to date  Covid-19 vaccine status: Information provided on how to obtain vaccines.   Qualifies for Shingles Vaccine? Yes   Zostavax completed Yes   Shingrix Completed?: Yes  Screening Tests Health Maintenance  Topic Date Due   MAMMOGRAM  07/25/2023   COVID-19 Vaccine (6 - 2023-24 season) 12/01/2023   HEMOGLOBIN A1C  03/01/2024   Diabetic kidney evaluation - eGFR measurement  05/19/2024   Diabetic kidney evaluation - Urine ACR  05/25/2024   OPHTHALMOLOGY EXAM  06/29/2024   FOOT EXAM  08/31/2024   Medicare Annual Wellness (AWV)  10/28/2024   Pneumonia Vaccine 69+ Years old  Completed   INFLUENZA VACCINE  Completed   DEXA SCAN  Completed   Hepatitis C Screening  Completed   Zoster Vaccines- Shingrix  Completed   HPV VACCINES  Aged Out   DTaP/Tdap/Td  Discontinued   Colonoscopy  Discontinued    Health Maintenance  Health Maintenance Due  Topic Date Due   MAMMOGRAM  07/25/2023    Colorectal cancer screening: No longer required.   Mammogram status: Ordered 11/26/23 scheduled. Pt provided with contact info and advised to call to schedule appt.   Bone Density status: Completed 02/16/18. Results reflect: Bone density results: NORMAL. Repeat every 2 years.  Additional Screening:  Hepatitis C Screening: Completed 12/09/17  Vision Screening: Recommended annual ophthalmology exams  for early detection of glaucoma and other disorders of the eye. Is the patient up to date with their annual eye exam?  Yes  Who is the provider or what is the name of the office in which the patient attends annual eye exams? Dr Genia Del  If pt is not established with a provider, would they like to be referred to a provider to establish care? No .   Dental Screening: Recommended annual dental exams for proper oral hygiene  Community Resource Referral / Chronic Care Management: CRR required this visit?  No   CCM required this visit?  No     Plan:     I have personally reviewed and noted the following in the patient's chart:   Medical and social history Use of alcohol, tobacco or illicit drugs  Current medications and supplements including opioid prescriptions. Patient is not currently taking opioid prescriptions. Functional ability and status Nutritional status Physical activity Advanced directives List of other physicians Hospitalizations, surgeries, and ER visits in previous 12 months Vitals Screenings to include cognitive, depression, and falls Referrals and appointments  In addition, I have reviewed and discussed with patient certain preventive protocols, quality metrics, and best practice recommendations. A written personalized care plan for preventive services as well as general preventive health recommendations were provided to patient.     Marzella Schlein, LPN   [id number]   After Visit Summary: (MyChart) Due to this being a telephonic visit, the after visit summary with patients personalized plan was offered to patient via MyChart   Nurse Notes: none

## 2023-10-29 NOTE — Patient Instructions (Signed)
[name] , Thank you for taking time to come for your Medicare Wellness Visit. I appreciate your ongoing commitment to your health goals. Please review the following plan we discussed and let me know if I can assist you in the future.   Referrals/Orders/Follow-Ups/Clinician Recommendations: Each day, aim for 6 glasses of water, plenty of protein in your diet and try to get up and walk/ stretch every hour for 5-10 minutes at a time.    This is a list of the screening recommended for you and due dates:  Health Maintenance  Topic Date Due   Mammogram  07/25/2023   Medicare Annual Wellness Visit  10/23/2023   COVID-19 Vaccine (6 - 2023-24 season) 12/01/2023   Hemoglobin A1C  03/01/2024   Yearly kidney function blood test for diabetes  05/19/2024   Yearly kidney health urinalysis for diabetes  05/25/2024   Eye exam for diabetics  06/29/2024   Complete foot exam   08/31/2024   Pneumonia Vaccine  Completed   Flu Shot  Completed   DEXA scan (bone density measurement)  Completed   Hepatitis C Screening  Completed   Zoster (Shingles) Vaccine  Completed   HPV Vaccine  Aged Out   DTaP/Tdap/Td vaccine  Discontinued   Colon Cancer Screening  Discontinued    Advanced directives: (Declined) Advance directive discussed with you today. Even though you declined this today, please call our office should you change your mind, and we can give you the proper paperwork for you to fill out.  Next Medicare Annual Wellness Visit scheduled for next year: Yes

## 2023-11-20 ENCOUNTER — Encounter (HOSPITAL_COMMUNITY): Payer: Medicare Other

## 2023-11-25 ENCOUNTER — Ambulatory Visit (HOSPITAL_COMMUNITY)
Admission: RE | Admit: 2023-11-25 | Discharge: 2023-11-25 | Disposition: A | Discharge status: Home / Self Care | Payer: Medicare Other | Source: Ambulatory Visit | Attending: Nephrology | Admitting: Nephrology

## 2023-11-25 VITALS — BP 136/40 | HR 52 | Resp 18

## 2023-11-25 DIAGNOSIS — N184 Chronic kidney disease, stage 4 (severe): Secondary | ICD-10-CM | POA: Insufficient documentation

## 2023-11-25 LAB — IRON AND TIBC
Iron: 98 ug/dL (ref 28–170)
Saturation Ratios: 33 % — ABNORMAL HIGH (ref 10.4–31.8)
TIBC: 297 ug/dL (ref 250–450)
UIBC: 199 ug/dL

## 2023-11-25 LAB — POCT HEMOGLOBIN-HEMACUE: Hemoglobin: 9.8 g/dL — ABNORMAL LOW (ref 12.0–15.0)

## 2023-11-25 LAB — FERRITIN: Ferritin: 106 ng/mL (ref 11–307)

## 2023-11-25 MED ORDER — EPOETIN ALFA-EPBX 10000 UNIT/ML IJ SOLN
10000.0000 [IU] | INTRAMUSCULAR | Status: DC
Start: 2023-11-25 — End: 2024-12-21
  Administered 2023-11-25: 10000 [IU] via SUBCUTANEOUS

## 2023-11-25 MED ORDER — EPOETIN ALFA-EPBX 10000 UNIT/ML IJ SOLN
INTRAMUSCULAR | Status: AC
  Filled 2023-11-25: qty 1

## 2023-11-26 ENCOUNTER — Ambulatory Visit
Admission: RE | Admit: 2023-11-26 | Discharge: 2023-11-26 | Disposition: A | Discharge status: Home / Self Care | Payer: Medicare Other | Source: Ambulatory Visit | Attending: Family Medicine | Admitting: Family Medicine

## 2023-11-26 DIAGNOSIS — Z1231 Encounter for screening mammogram for malignant neoplasm of breast: Secondary | ICD-10-CM

## 2023-12-16 ENCOUNTER — Other Ambulatory Visit: Payer: Self-pay | Admitting: Family Medicine

## 2023-12-17 ENCOUNTER — Encounter: Payer: Self-pay | Admitting: Podiatry

## 2023-12-17 ENCOUNTER — Ambulatory Visit: Payer: Medicare Other | Admitting: Podiatry

## 2023-12-17 VITALS — Ht 64.0 in | Wt 166.0 lb

## 2023-12-17 DIAGNOSIS — M79674 Pain in right toe(s): Secondary | ICD-10-CM | POA: Diagnosis not present

## 2023-12-17 DIAGNOSIS — M79675 Pain in left toe(s): Secondary | ICD-10-CM

## 2023-12-17 DIAGNOSIS — B351 Tinea unguium: Secondary | ICD-10-CM

## 2023-12-17 DIAGNOSIS — E119 Type 2 diabetes mellitus without complications: Secondary | ICD-10-CM

## 2023-12-21 NOTE — Progress Notes (Signed)
  Subjective:  Patient ID: Teresa Barnes, female    DOB: Feb 28, 1946,  MRN: [id number]  78 y.o. female presents to clinic with  preventative diabetic foot care and painful, elongated thickened toenails x 10 which are symptomatic when wearing enclosed shoe gear. This interferes with his/her daily activities. Patient states left great toe feels much better since she has matrixectomy performed. She has had no problems since her last visit with Dr. Lilian Kapur. Chief Complaint  Patient presents with   [facility]    She is here for a nail trim, her PCP is Dr. Mardelle Matte and seen 3 months ago. Her last A1C was 5.6   New problem(s): None   PCP is [name], MD.  Allergies  Allergen Reactions   Prednisone Nausea And Vomiting   Tramadol Nausea Only    Review of Systems: Negative except as noted in the HPI.   Objective:  Teresa Barnes is a pleasant 78 y.o. female WD, WN in NAD. [name] x 3.  Vascular Examination: Vascular status intact b/l with palpable pedal pulses. CFT immediate b/l. No edema. No pain with calf compression b/l. Skin temperature gradient WNL b/l.   Neurological Examination: Sensation grossly intact b/l with 10 gram monofilament. Vibratory sensation intact b/l.   Dermatological Examination: Pedal skin with normal turgor, texture and tone b/l. Toenails 1-5 b/l thick, discolored, elongated with subungual debris and pain on dorsal palpation. No hyperkeratotic lesions noted b/l. Evidence of partial matrixectomy lateral border left hallux. No underlying erythema, no edema, no drainage. Area is completely epithelialized. Procedure site of lateral border left hallux noted to be completely healed with no erythema, no edema, no drainage, no purulence.   Musculoskeletal Examination: Muscle strength 5/5 to b/l LE. No pain, crepitus or joint limitation noted with ROM bilateral LE. No gross bony deformities bilaterally.  Radiographs: None  Last A1c:      Latest Ref Rng & Units 09/01/2023   11:53  AM 02/28/2023   10:17 AM  Hemoglobin A1C  Hemoglobin-A1c 4.0 - 5.6 % 5.6  5.5    Assessment:   1. Pain due to onychomycosis of toenails of both feet   2. Diabetes mellitus without complication (HCC)     Plan:  -Patient was evaluated today. All questions/concerns addressed on today's visit. -Scab removed from procedure site and cleansed with alcohol. TAO applied. No further treatment required by patient. -Continue foot and shoe inspections daily. Monitor blood glucose per PCP/Endocrinologist's recommendations. -Patient to continue soft, supportive shoe gear daily. -Mycotic toenails 1-5 bilaterally were debrided in length and girth with sterile nail nippers and dremel without incident. -Patient/POA to call should there be question/concern in the interim.  Return in about 3 months (around 03/16/2024).  Freddie Breech, DPM      [place] LOCATION: [address] [place] [address]                   Office [phone]   [facility] LOCATION: [address] [place], [place] [address] Office [phone]

## 2023-12-23 ENCOUNTER — Encounter (HOSPITAL_COMMUNITY)
Admission: RE | Admit: 2023-12-23 | Discharge: 2023-12-23 | Disposition: A | Discharge status: Home / Self Care | Payer: Medicare Other | Source: Ambulatory Visit | Attending: Nephrology | Admitting: Nephrology

## 2023-12-23 VITALS — BP 123/46 | HR 51 | Temp 97.5°F | Resp 18

## 2023-12-23 DIAGNOSIS — N184 Chronic kidney disease, stage 4 (severe): Secondary | ICD-10-CM | POA: Insufficient documentation

## 2023-12-23 LAB — IRON AND TIBC
Iron: 94 ug/dL (ref 28–170)
Saturation Ratios: 31 % (ref 10.4–31.8)
TIBC: 305 ug/dL (ref 250–450)
UIBC: 211 ug/dL

## 2023-12-23 LAB — FERRITIN: Ferritin: 98 ng/mL (ref 11–307)

## 2023-12-23 LAB — POCT HEMOGLOBIN-HEMACUE: Hemoglobin: 10.9 g/dL — ABNORMAL LOW (ref 12.0–15.0)

## 2023-12-23 MED ORDER — EPOETIN ALFA-EPBX 10000 UNIT/ML IJ SOLN
INTRAMUSCULAR | Status: AC
  Filled 2023-12-23: qty 1

## 2023-12-23 MED ORDER — EPOETIN ALFA-EPBX 10000 UNIT/ML IJ SOLN
10000.0000 [IU] | INTRAMUSCULAR | Status: DC
Start: 2023-12-23 — End: 2025-01-18
  Administered 2023-12-23: 10000 [IU] via SUBCUTANEOUS

## 2023-12-29 DIAGNOSIS — H40053 Ocular hypertension, bilateral: Secondary | ICD-10-CM | POA: Diagnosis not present

## 2024-01-01 DIAGNOSIS — I129 Hypertensive chronic kidney disease with stage 1 through stage 4 chronic kidney disease, or unspecified chronic kidney disease: Secondary | ICD-10-CM | POA: Diagnosis not present

## 2024-01-01 DIAGNOSIS — N2581 Secondary hyperparathyroidism of renal origin: Secondary | ICD-10-CM | POA: Diagnosis not present

## 2024-01-01 DIAGNOSIS — D638 Anemia in other chronic diseases classified elsewhere: Secondary | ICD-10-CM | POA: Diagnosis not present

## 2024-01-01 DIAGNOSIS — E1122 Type 2 diabetes mellitus with diabetic chronic kidney disease: Secondary | ICD-10-CM | POA: Diagnosis not present

## 2024-01-01 DIAGNOSIS — N1832 Chronic kidney disease, stage 3b: Secondary | ICD-10-CM | POA: Diagnosis not present

## 2024-01-01 DIAGNOSIS — K2210 Ulcer of esophagus without bleeding: Secondary | ICD-10-CM | POA: Diagnosis not present

## 2024-01-01 DIAGNOSIS — R3129 Other microscopic hematuria: Secondary | ICD-10-CM | POA: Diagnosis not present

## 2024-01-02 LAB — LAB REPORT - SCANNED
Calcium: 9.7
Creatinine, POC: 51.7 mg/dL
EGFR: 25
PTH: 68

## 2024-01-05 ENCOUNTER — Encounter (INDEPENDENT_AMBULATORY_CARE_PROVIDER_SITE_OTHER): Payer: Medicare Other | Admitting: Ophthalmology

## 2024-01-12 ENCOUNTER — Other Ambulatory Visit: Payer: Self-pay | Admitting: Family Medicine

## 2024-01-20 ENCOUNTER — Encounter (HOSPITAL_COMMUNITY)
Admission: RE | Admit: 2024-01-20 | Discharge: 2024-01-20 | Disposition: A | Discharge status: Home / Self Care | Payer: Medicare Other | Source: Ambulatory Visit | Attending: Nephrology | Admitting: Nephrology

## 2024-01-20 VITALS — BP 146/43 | HR 51 | Temp 97.7°F | Resp 16

## 2024-01-20 DIAGNOSIS — N184 Chronic kidney disease, stage 4 (severe): Secondary | ICD-10-CM | POA: Diagnosis not present

## 2024-01-20 LAB — FERRITIN: Ferritin: 64 ng/mL (ref 11–307)

## 2024-01-20 LAB — IRON AND TIBC
Iron: 83 ug/dL (ref 28–170)
Saturation Ratios: 25 % (ref 10.4–31.8)
TIBC: 329 ug/dL (ref 250–450)
UIBC: 246 ug/dL

## 2024-01-20 LAB — POCT HEMOGLOBIN-HEMACUE: Hemoglobin: 11.2 g/dL — ABNORMAL LOW (ref 12.0–15.0)

## 2024-01-20 MED ORDER — EPOETIN ALFA-EPBX 10000 UNIT/ML IJ SOLN
INTRAMUSCULAR | Status: AC
  Filled 2024-01-20: qty 1

## 2024-01-20 MED ORDER — EPOETIN ALFA-EPBX 10000 UNIT/ML IJ SOLN
10000.0000 [IU] | INTRAMUSCULAR | Status: DC
  Administered 2024-01-20: 10000 [IU] via SUBCUTANEOUS

## 2024-02-02 ENCOUNTER — Encounter (INDEPENDENT_AMBULATORY_CARE_PROVIDER_SITE_OTHER): Payer: Medicare Other | Admitting: Ophthalmology

## 2024-02-12 ENCOUNTER — Other Ambulatory Visit: Payer: Self-pay | Admitting: Family Medicine

## 2024-02-13 ENCOUNTER — Other Ambulatory Visit: Payer: Self-pay | Admitting: Family Medicine

## 2024-02-17 ENCOUNTER — Ambulatory Visit (HOSPITAL_COMMUNITY)
Admission: RE | Admit: 2024-02-17 | Discharge: 2024-02-17 | Disposition: A | Discharge status: Home / Self Care | Payer: Medicare Other | Source: Ambulatory Visit | Attending: Nephrology | Admitting: Nephrology

## 2024-02-17 VITALS — BP 155/60 | HR 53 | Temp 97.4°F | Resp 17

## 2024-02-17 DIAGNOSIS — N184 Chronic kidney disease, stage 4 (severe): Secondary | ICD-10-CM | POA: Insufficient documentation

## 2024-02-17 LAB — POCT HEMOGLOBIN-HEMACUE: Hemoglobin: 10.9 g/dL — ABNORMAL LOW (ref 12.0–15.0)

## 2024-02-17 LAB — IRON AND TIBC
Iron: 84 ug/dL (ref 28–170)
Saturation Ratios: 26 % (ref 10.4–31.8)
TIBC: 323 ug/dL (ref 250–450)
UIBC: 239 ug/dL

## 2024-02-17 LAB — FERRITIN: Ferritin: 70 ng/mL (ref 11–307)

## 2024-02-17 MED ORDER — EPOETIN ALFA-EPBX 10000 UNIT/ML IJ SOLN
10000.0000 [IU] | INTRAMUSCULAR | Status: DC
  Administered 2024-02-17: 10000 [IU] via SUBCUTANEOUS

## 2024-02-17 MED ORDER — EPOETIN ALFA-EPBX 10000 UNIT/ML IJ SOLN
INTRAMUSCULAR | Status: AC
  Filled 2024-02-17: qty 1

## 2024-03-01 ENCOUNTER — Ambulatory Visit: Payer: Medicare Other | Admitting: Family Medicine

## 2024-03-08 ENCOUNTER — Ambulatory Visit: Payer: Medicare Other | Admitting: Family Medicine

## 2024-03-10 ENCOUNTER — Encounter: Payer: Self-pay | Admitting: Family Medicine

## 2024-03-10 ENCOUNTER — Ambulatory Visit: Payer: Medicare Other | Admitting: Family Medicine

## 2024-03-10 VITALS — BP 141/55 | HR 50 | Temp 97.7°F | Ht 64.0 in | Wt 169.4 lb

## 2024-03-10 DIAGNOSIS — R600 Localized edema: Secondary | ICD-10-CM | POA: Diagnosis not present

## 2024-03-10 DIAGNOSIS — Z7985 Long-term (current) use of injectable non-insulin antidiabetic drugs: Secondary | ICD-10-CM

## 2024-03-10 DIAGNOSIS — Z853 Personal history of malignant neoplasm of breast: Secondary | ICD-10-CM | POA: Diagnosis not present

## 2024-03-10 DIAGNOSIS — D638 Anemia in other chronic diseases classified elsewhere: Secondary | ICD-10-CM | POA: Diagnosis not present

## 2024-03-10 DIAGNOSIS — Z7984 Long term (current) use of oral hypoglycemic drugs: Secondary | ICD-10-CM

## 2024-03-10 DIAGNOSIS — E1122 Type 2 diabetes mellitus with diabetic chronic kidney disease: Secondary | ICD-10-CM | POA: Diagnosis not present

## 2024-03-10 DIAGNOSIS — E782 Mixed hyperlipidemia: Secondary | ICD-10-CM

## 2024-03-10 DIAGNOSIS — N184 Chronic kidney disease, stage 4 (severe): Secondary | ICD-10-CM

## 2024-03-10 DIAGNOSIS — E1159 Type 2 diabetes mellitus with other circulatory complications: Secondary | ICD-10-CM

## 2024-03-10 DIAGNOSIS — E119 Type 2 diabetes mellitus without complications: Secondary | ICD-10-CM

## 2024-03-10 DIAGNOSIS — I152 Hypertension secondary to endocrine disorders: Secondary | ICD-10-CM

## 2024-03-10 LAB — CBC WITH DIFFERENTIAL/PLATELET
Basophils Absolute: 0.1 10*3/uL (ref 0.0–0.1)
Basophils Relative: 1.8 % (ref 0.0–3.0)
Eosinophils Absolute: 0.3 10*3/uL (ref 0.0–0.7)
Eosinophils Relative: 7.5 % — ABNORMAL HIGH (ref 0.0–5.0)
HCT: 34.9 % — ABNORMAL LOW (ref 36.0–46.0)
Hemoglobin: 11.2 g/dL — ABNORMAL LOW (ref 12.0–15.0)
Lymphocytes Relative: 38.7 % (ref 12.0–46.0)
Lymphs Abs: 1.7 10*3/uL (ref 0.7–4.0)
MCHC: 32.2 g/dL (ref 30.0–36.0)
MCV: 94.1 fl (ref 78.0–100.0)
Monocytes Absolute: 0.5 10*3/uL (ref 0.1–1.0)
Monocytes Relative: 12.1 % — ABNORMAL HIGH (ref 3.0–12.0)
Neutro Abs: 1.8 10*3/uL (ref 1.4–7.7)
Neutrophils Relative %: 39.9 % — ABNORMAL LOW (ref 43.0–77.0)
Platelets: 222 10*3/uL (ref 150.0–400.0)
RBC: 3.71 Mil/uL — ABNORMAL LOW (ref 3.87–5.11)
RDW: 15.4 % (ref 11.5–15.5)
WBC: 4.4 10*3/uL (ref 4.0–10.5)

## 2024-03-10 LAB — LIPID PANEL
Cholesterol: 183 mg/dL (ref 0–200)
HDL: 71.6 mg/dL (ref >39.00)
LDL Cholesterol: 101 mg/dL — ABNORMAL HIGH (ref 0–99)
NonHDL: 111.09
Total CHOL/HDL Ratio: 3
Triglycerides: 51 mg/dL (ref 0.0–149.0)
VLDL: 10.2 mg/dL (ref 0.0–40.0)

## 2024-03-10 LAB — COMPREHENSIVE METABOLIC PANEL WITH GFR
ALT: 10 U/L (ref 0–35)
AST: 19 U/L (ref 0–37)
Albumin: 4.3 g/dL (ref 3.5–5.2)
Alkaline Phosphatase: 46 U/L (ref 39–117)
BUN: 48 mg/dL — ABNORMAL HIGH (ref 6–23)
CO2: 26 meq/L (ref 19–32)
Calcium: 9.5 mg/dL (ref 8.4–10.5)
Chloride: 105 meq/L (ref 96–112)
Creatinine, Ser: 1.85 mg/dL — ABNORMAL HIGH (ref 0.40–1.20)
GFR: 25.61 mL/min — ABNORMAL LOW (ref >60.00)
Glucose, Bld: 88 mg/dL (ref 70–99)
Potassium: 4.4 meq/L (ref 3.5–5.1)
Sodium: 138 meq/L (ref 135–145)
Total Bilirubin: 0.8 mg/dL (ref 0.2–1.2)
Total Protein: 7.3 g/dL (ref 6.0–8.3)

## 2024-03-10 LAB — HEMOGLOBIN A1C: Hgb A1c MFr Bld: 6.1 % (ref 4.6–6.5)

## 2024-03-10 LAB — TSH: TSH: 0.61 u[IU]/mL (ref 0.35–5.50)

## 2024-03-10 LAB — MICROALBUMIN / CREATININE URINE RATIO
Creatinine,U: 56.4 mg/dL
Microalb Creat Ratio: 129.5 mg/g — ABNORMAL HIGH (ref 0.0–30.0)
Microalb, Ur: 7.3 mg/dL — ABNORMAL HIGH (ref 0.0–1.9)

## 2024-03-10 NOTE — Patient Instructions (Signed)
 Please return in 6 months for recheck  I will release your lab results to you on your MyChart account with further instructions. You may see the results before I do, but when I review them I will send you a message with my report or have my assistant call you if things need to be discussed. Please reply to my message with any questions. Thank you!   If you have any questions or concerns, please don't hesitate to send me a message via MyChart or call the office at [phone]. Thank you for visiting with us  today! It's our pleasure caring for you.   VISIT SUMMARY:  Today, you came in for a routine follow-up and blood work. We discussed your blood pressure fluctuations and the need for routine blood tests. You mentioned that your blood pressure tends to be higher during office visits, and you have not had blood work done in about a year. You are scheduled to have blood work done next week with your kidney doctor, but you are open to having it done today to avoid multiple draws. You also mentioned that you use a walker for mobility and find it challenging to get onto the examination table. Overall, you reported doing 'pretty good' in terms of getting around and had no new concerns or symptoms.  YOUR PLAN:  -HYPERTENSION: Hypertension means high blood pressure. Your blood pressure was elevated today despite taking losartan 50 mg. We recommend taking your losartan before appointments for more accurate readings. If your systolic blood pressure remains high, we may need to adjust your medication dose.  -WELLNESS VISIT: This was a routine visit with no new issues. We will order blood tests for A1c, cholesterol, thyroid , and urine to monitor your overall health. We will also coordinate with your kidney doctor to avoid duplicate blood draws.  INSTRUCTIONS:  Please take your losartan before your next appointment for a more accurate blood pressure reading. We will order the necessary blood tests today to  avoid multiple draws. Follow up with your kidney doctor as scheduled for your comprehensive panel.

## 2024-03-10 NOTE — Progress Notes (Signed)
 Subjective  CC:  Chief Complaint  Patient presents with   Annual Exam    Pt here for annual Exam and is not currently fasting   Diabetes   Hypertension    HPI: Teresa Barnes is a 78 y.o. female who presents to the office today to address the problems listed above in the chief complaint. Discussed the use of AI scribe software for clinical note transcription with the patient, who gave verbal consent to proceed.  History of Present Illness   Teresa Barnes is a 78 year old female with hypertension who presents for routine follow-up and blood work.  HTN f/u: Feeling well. Taking medications w/o adverse effects. No symptoms of CHF, angina; no palpitations, sob, cp or lower extremity edema. Compliant with meds.  Elevated in office today. Compliant with meds.   DM f/u: no sxs of high or low blood sugar. On farxiga . Normal appetite. No foot concerns. On ARB and statin. Imms current.  H/o spinal fusion and lumbar radiculopathy with unsteady gait . Uses walker. Gets around "ok". No recent falls  CKD due to diabetes stage 4/ w/o new symptoms. On farxiga  and arb. Anemia. Some edema on lasix .       Assessment  1. Type 2 diabetes mellitus with stage 4 chronic kidney disease, without long-term current use of insulin  (HCC)   2. CKD (chronic kidney disease) stage 4, GFR 15-29 ml/min (HCC)   3. Hypertension associated with diabetes (HCC)   4. Mixed hyperlipidemia   5. Anemia of chronic disease   6. History of right breast cancer   7. Lower extremity edema   8. Long-term (current) use of injectable non-insulin  antidiabetic drugs   9. Diabetes mellitus treated with oral medication (HCC)      Plan  Assessment and Plan    Hypertension Systolic blood pressure elevated despite losartan 50 mg. Possible need for dose adjustment. - Advise taking losartan before appointments for accurate readings. - Consider losartan dose adjustment if systolic remains elevated. - she has f/u with renal next  week. -check labs  HLD on statin for recheck today  DM: check A1c. Control has been good. Routine diabetic care up to date  Fall prevention discussed  CKD on farxiga  and arb. Check labs. F/u with renal.   Wellness Visit Routine visit with no acute issues. Blood work needed for routine monitoring. - Order A1c, cholesterol, thyroid , and urine tests. - Coordinate with nephrologist to avoid duplicate blood draws.        Orders Placed This Encounter  Procedures   CBC with Differential/Platelet   Comprehensive metabolic panel with GFR   Lipid panel   Hemoglobin A1c   TSH   Microalbumin / creatinine urine ratio   No orders of the defined types were placed in this encounter.    I reviewed the patients updated PMH, FH, and SocHx.    Patient Active Problem List   Diagnosis Date Noted   Type 2 diabetes mellitus with stage 4 chronic kidney disease, without long-term current use of insulin  (HCC) 08/09/2020    Priority: High   CKD (chronic kidney disease) stage 4, GFR 15-29 ml/min (HCC) 05/31/2020    Priority: High   Mixed hyperlipidemia 12/09/2017    Priority: High   Hypertension associated with diabetes (HCC) 12/09/2017    Priority: High   H/O: upper GI bleed 07/30/2016    Priority: High   Lower extremity edema 08/15/2022    Priority: Medium    S/P cervical spinal fusion 03/26/2019  Priority: Medium    Anemia of chronic disease 09/24/2018    Priority: Medium    History of right breast cancer 12/09/2017    Priority: Medium    Hiatal hernia 12/09/2017    Priority: Medium    Esophageal stricture 12/09/2017    Priority: Medium    Chronic gout 12/09/2017    Priority: Medium    Glaucoma 12/09/2017    Priority: Medium    Degenerative arthritis of right knee 07/03/2017    Priority: Medium    Right lumbar radiculopathy 01/23/2016    Priority: Medium    Arthritis of right acromioclavicular joint 01/02/2016    Priority: Medium    Greater trochanteric bursitis of right  hip 11/17/2017    Priority: Low   Acute bursitis of right shoulder 06/03/2019   Adhesive bursitis of left shoulder 08/05/2018   Acquired involutional ptosis of eyelid 06/25/2012   Current Meds  Medication Sig   dorzolamide-timolol  (COSOPT) 22.3-6.8 MG/ML ophthalmic solution Place 1 drop into both eyes 2 (two) times daily.   FARXIGA  10 MG TABS tablet Take 1 tablet by mouth once daily   furosemide  (LASIX ) 40 MG tablet Take 40 mg by mouth daily as needed.   latanoprost  (XALATAN ) 0.005 % ophthalmic solution Place 1 drop into both eyes at bedtime.   losartan (COZAAR) 50 MG tablet Take 50 mg by mouth daily.   omeprazole  (PRILOSEC) 20 MG capsule Take 20 mg by mouth daily.   OZEMPIC , 0.25 OR 0.5 MG/DOSE, 2 MG/3ML SOPN INJECT 0.5MG  INTO THE SKIN ONCE WEEKLY   rosuvastatin  (CRESTOR ) 20 MG tablet Take 1 tablet (20 mg total) by mouth at bedtime.   vitamin B-12 (CYANOCOBALAMIN ) 500 MCG tablet Take 500 mcg by mouth every other day.   Vitamin D, Cholecalciferol, 25 MCG (1000 UT) TABS Take 1 tablet by mouth daily.    Allergies: Patient is allergic to prednisone and tramadol . Family History: Patient family history includes Arthritis in her daughter and son; Diabetes in her daughter, mother, sister, and son; Hyperlipidemia in her daughter. Social History:  Patient  reports that she quit smoking about 30 years ago. Her smoking use included cigarettes. She started smoking about 40 years ago. She has a 2.5 pack-year smoking history. She has never used smokeless tobacco. She reports that she does not drink alcohol  and does not use drugs.  Review of Systems: Constitutional: Negative for fever malaise or anorexia Cardiovascular: negative for chest pain Respiratory: negative for SOB or persistent cough Gastrointestinal: negative for abdominal pain  Objective  Vitals: BP (!) 141/55   Pulse (!) 50   Temp 97.7 F (36.5 C)   Ht 5\' 4"  (1.626 m)   Wt 169 lb 6.4 oz (76.8 kg)   SpO2 98%   BMI 29.08 kg/m   General: no acute distress , A&Ox3, frail HEENT: PEERL, conjunctiva normal, neck is supple Cardiovascular:  RRR no rub Respiratory:  Good breath sounds bilaterally, CTAB with normal respiratory effort Skin:  Warm, no rashes Ext tr edema  Commons side effects, risks, benefits, and alternatives for medications and treatment plan prescribed today were discussed, and the patient expressed understanding of the given instructions. Patient is instructed to call or message via MyChart if he/she has any questions or concerns regarding our treatment plan. No barriers to understanding were identified. We discussed Red Flag symptoms and signs in detail. Patient expressed understanding regarding what to do in case of urgent or emergency type symptoms.  Medication list was reconciled, printed and provided to the patient  in AVS. Patient instructions and summary information was reviewed with the patient as documented in the AVS. This note was prepared with assistance of Dragon voice recognition software. Occasional wrong-word or sound-a-like substitutions may have occurred due to the inherent limitations of voice recognition software

## 2024-03-12 NOTE — Progress Notes (Signed)
 Please call patient:lab work shows kidney function is stable; hemoglobin is stable on retacrit . Cholesterol levels are above goal now: please ensure she is taking her crestor  20mg  nightly. Diabetic control remains good. No changes are needed at this time. Follow up with nephrology to recheck blood pressure.

## 2024-03-16 ENCOUNTER — Encounter (HOSPITAL_COMMUNITY)
Admission: RE | Admit: 2024-03-16 | Discharge: 2024-03-16 | Disposition: A | Discharge status: Home / Self Care | Source: Ambulatory Visit | Attending: Nephrology | Admitting: Nephrology

## 2024-03-16 DIAGNOSIS — N184 Chronic kidney disease, stage 4 (severe): Secondary | ICD-10-CM | POA: Diagnosis not present

## 2024-03-16 LAB — POCT HEMOGLOBIN-HEMACUE: Hemoglobin: 9.5 g/dL — ABNORMAL LOW (ref 12.0–15.0)

## 2024-03-16 MED ORDER — EPOETIN ALFA 10000 UNIT/ML IJ SOLN
INTRAMUSCULAR | Status: AC
  Filled 2024-03-16: qty 1

## 2024-03-16 MED ORDER — EPOETIN ALFA-EPBX 10000 UNIT/ML IJ SOLN: 10000.0000 [IU] | INTRAMUSCULAR | Status: DC

## 2024-03-16 MED ORDER — EPOETIN ALFA 10000 UNIT/ML IJ SOLN
10000.0000 [IU] | Freq: Once | INTRAMUSCULAR | Status: AC
  Administered 2024-03-16: 10000 [IU] via SUBCUTANEOUS

## 2024-03-17 ENCOUNTER — Ambulatory Visit: Payer: Medicare Other | Admitting: Podiatry

## 2024-03-24 ENCOUNTER — Ambulatory Visit: Admitting: Podiatry

## 2024-03-24 ENCOUNTER — Encounter: Payer: Self-pay | Admitting: Podiatry

## 2024-03-24 VITALS — Ht 64.0 in | Wt 169.4 lb

## 2024-03-24 DIAGNOSIS — B351 Tinea unguium: Secondary | ICD-10-CM

## 2024-03-24 DIAGNOSIS — E119 Type 2 diabetes mellitus without complications: Secondary | ICD-10-CM

## 2024-03-24 DIAGNOSIS — M79674 Pain in right toe(s): Secondary | ICD-10-CM

## 2024-03-24 DIAGNOSIS — M79675 Pain in left toe(s): Secondary | ICD-10-CM | POA: Diagnosis not present

## 2024-03-28 NOTE — Progress Notes (Signed)
  Subjective:  Patient ID: Teresa Barnes, female    DOB: 15-May-1946,  MRN: [id number]  78 y.o. female presents preventative diabetic foot care and painful elongated mycotic toenails 1-5 bilaterally which are tender when wearing enclosed shoe gear. Pain is relieved with periodic professional debridement. Chief Complaint  Patient presents with   Nail Problem    Pt is here for [facility] last A1C was 5.9 PCP is Dr Jonelle Neri and LOV was in April.   New problem(s): None   PCP is Luevenia Saha, MD Allergies  Allergen Reactions   Prednisone Nausea And Vomiting   Tramadol  Nausea Only    Review of Systems: Negative except as noted in the HPI.   Objective:  Teresa Barnes is a pleasant 78 y.o. female WD, WN in NAD. [name] x 3.  Vascular Examination: Vascular status intact b/l with palpable pedal pulses. CFT immediate b/l. Pedal hair present. No edema. No pain with calf compression b/l. Skin temperature gradient WNL b/l. No varicosities noted. No cyanosis or clubbing noted.  Neurological Examination: Sensation grossly intact b/l with 10 gram monofilament. Vibratory sensation intact b/l.  Dermatological Examination: Pedal skin with normal turgor, texture and tone b/l. No open wounds nor interdigital macerations noted. Toenails 1-5 b/l thick, discolored, elongated with subungual debris and pain on dorsal palpation. No hyperkeratotic lesions noted b/l.   Musculoskeletal Examination: Muscle strength 5/5 to b/l LE.  No pain, crepitus noted b/l. No gross pedal deformities. Patient ambulates with walker assistance.  Radiographs: None Last A1c:      Latest Ref Rng & Units 03/10/2024   11:45 AM 09/01/2023   11:53 AM  Hemoglobin A1C  Hemoglobin-A1c 4.6 - 6.5 % 6.1  5.6    Assessment:   1. Pain due to onychomycosis of toenails of both feet   2. Diabetes mellitus without complication (HCC)     Plan:  Consent given for treatment. Patient examined. All patient's and/or POA's questions/concerns addressed  on today's visit. Toenails 1-5 debrided in length and girth without incident. Continue foot and shoe inspections daily. Monitor blood glucose per PCP/Endocrinologist's recommendations. Continue soft, supportive shoe gear daily. Report any pedal injuries to medical professional. Call office if there are any questions/concerns. -Patient/POA to call should there be question/concern in the interim.  Return in about 3 months (around 06/24/2024).  Teresa Barnes, DPM      [facility] [place] LOCATION: [address] [place] [address]                   Office [phone]   [facility] LOCATION: [address] [place], [place] [address] Office [phone]

## 2024-03-31 ENCOUNTER — Other Ambulatory Visit: Payer: Self-pay

## 2024-03-31 MED ORDER — ROSUVASTATIN CALCIUM 10 MG PO TABS: 10.0000 mg | ORAL_TABLET | Freq: Every day | ORAL | 3 refills | Status: DC

## 2024-04-07 ENCOUNTER — Other Ambulatory Visit: Payer: Self-pay | Admitting: Family Medicine

## 2024-04-13 ENCOUNTER — Ambulatory Visit (HOSPITAL_COMMUNITY)
Admission: RE | Admit: 2024-04-13 | Discharge: 2024-04-13 | Disposition: A | Discharge status: Home / Self Care | Source: Ambulatory Visit | Attending: Nephrology | Admitting: Nephrology

## 2024-04-13 VITALS — BP 153/49 | HR 81 | Temp 98.1°F | Resp 16

## 2024-04-13 DIAGNOSIS — N184 Chronic kidney disease, stage 4 (severe): Secondary | ICD-10-CM | POA: Diagnosis not present

## 2024-04-13 LAB — POCT HEMOGLOBIN-HEMACUE: Hemoglobin: 10.6 g/dL — ABNORMAL LOW (ref 12.0–15.0)

## 2024-04-13 LAB — IRON AND TIBC
Iron: 85 ug/dL (ref 28–170)
Saturation Ratios: 27 % (ref 10.4–31.8)
TIBC: 316 ug/dL (ref 250–450)
UIBC: 231 ug/dL

## 2024-04-13 LAB — FERRITIN: Ferritin: 70 ng/mL (ref 11–307)

## 2024-04-13 MED ORDER — EPOETIN ALFA-EPBX 10000 UNIT/ML IJ SOLN
10000.0000 [IU] | INTRAMUSCULAR | Status: DC
  Administered 2024-04-13: 10000 [IU] via SUBCUTANEOUS

## 2024-04-13 MED ORDER — EPOETIN ALFA-EPBX 10000 UNIT/ML IJ SOLN
INTRAMUSCULAR | Status: AC
  Filled 2024-04-13: qty 1

## 2024-04-27 ENCOUNTER — Encounter (HOSPITAL_COMMUNITY)
Admission: RE | Admit: 2024-04-27 | Discharge: 2024-04-27 | Disposition: A | Discharge status: Home / Self Care | Source: Ambulatory Visit | Attending: Nephrology | Admitting: Nephrology

## 2024-04-27 VITALS — BP 130/66 | HR 50 | Temp 97.2°F | Resp 16

## 2024-04-27 DIAGNOSIS — D638 Anemia in other chronic diseases classified elsewhere: Secondary | ICD-10-CM | POA: Diagnosis not present

## 2024-04-27 DIAGNOSIS — E1122 Type 2 diabetes mellitus with diabetic chronic kidney disease: Secondary | ICD-10-CM | POA: Diagnosis not present

## 2024-04-27 DIAGNOSIS — N184 Chronic kidney disease, stage 4 (severe): Secondary | ICD-10-CM | POA: Diagnosis not present

## 2024-04-27 LAB — POCT HEMOGLOBIN-HEMACUE: Hemoglobin: 10.6 g/dL — ABNORMAL LOW (ref 12.0–15.0)

## 2024-04-27 MED ORDER — EPOETIN ALFA-EPBX 10000 UNIT/ML IJ SOLN
INTRAMUSCULAR | Status: AC
  Filled 2024-04-27: qty 1

## 2024-04-27 MED ORDER — EPOETIN ALFA-EPBX 10000 UNIT/ML IJ SOLN
10000.0000 [IU] | Freq: Once | INTRAMUSCULAR | Status: AC
  Administered 2024-04-27: 10000 [IU] via SUBCUTANEOUS

## 2024-05-11 ENCOUNTER — Encounter (HOSPITAL_COMMUNITY)
Admission: RE | Admit: 2024-05-11 | Discharge: 2024-05-11 | Disposition: A | Discharge status: Home / Self Care | Source: Ambulatory Visit | Attending: Nephrology

## 2024-05-11 VITALS — BP 128/51 | HR 56 | Temp 97.0°F | Resp 16

## 2024-05-11 DIAGNOSIS — D638 Anemia in other chronic diseases classified elsewhere: Secondary | ICD-10-CM | POA: Diagnosis not present

## 2024-05-11 DIAGNOSIS — E1122 Type 2 diabetes mellitus with diabetic chronic kidney disease: Secondary | ICD-10-CM | POA: Diagnosis not present

## 2024-05-11 DIAGNOSIS — N184 Chronic kidney disease, stage 4 (severe): Secondary | ICD-10-CM

## 2024-05-11 LAB — IRON AND TIBC
Iron: 92 ug/dL (ref 28–170)
Saturation Ratios: 29 % (ref 10.4–31.8)
TIBC: 322 ug/dL (ref 250–450)
UIBC: 230 ug/dL

## 2024-05-11 LAB — POCT HEMOGLOBIN-HEMACUE: Hemoglobin: 10.7 g/dL — ABNORMAL LOW (ref 12.0–15.0)

## 2024-05-11 LAB — FERRITIN: Ferritin: 28 ng/mL (ref 11–307)

## 2024-05-11 MED ORDER — EPOETIN ALFA-EPBX 10000 UNIT/ML IJ SOLN
INTRAMUSCULAR | Status: AC
  Filled 2024-05-11: qty 1

## 2024-05-11 MED ORDER — EPOETIN ALFA-EPBX 10000 UNIT/ML IJ SOLN
10000.0000 [IU] | INTRAMUSCULAR | Status: DC
  Administered 2024-05-11: 10000 [IU] via SUBCUTANEOUS

## 2024-05-25 ENCOUNTER — Encounter (HOSPITAL_COMMUNITY)

## 2024-06-01 ENCOUNTER — Encounter (HOSPITAL_COMMUNITY)

## 2024-06-08 ENCOUNTER — Encounter (HOSPITAL_COMMUNITY)

## 2024-06-08 ENCOUNTER — Encounter (HOSPITAL_COMMUNITY)
Admission: RE | Admit: 2024-06-08 | Discharge: 2024-06-08 | Disposition: A | Discharge status: Home / Self Care | Source: Ambulatory Visit | Attending: Nephrology | Admitting: Nephrology

## 2024-06-08 VITALS — BP 131/45 | HR 62 | Temp 97.8°F

## 2024-06-08 DIAGNOSIS — N184 Chronic kidney disease, stage 4 (severe): Secondary | ICD-10-CM | POA: Diagnosis not present

## 2024-06-08 LAB — FERRITIN: Ferritin: 67 ng/mL (ref 11–307)

## 2024-06-08 LAB — IRON AND TIBC
Iron: 73 ug/dL (ref 28–170)
Saturation Ratios: 23 % (ref 10.4–31.8)
TIBC: 318 ug/dL (ref 250–450)
UIBC: 245 ug/dL

## 2024-06-08 LAB — POCT HEMOGLOBIN-HEMACUE: Hemoglobin: 11.4 g/dL — ABNORMAL LOW (ref 12.0–15.0)

## 2024-06-08 MED ORDER — EPOETIN ALFA-EPBX 10000 UNIT/ML IJ SOLN
INTRAMUSCULAR | Status: AC
Start: 2024-06-08 — End: 2024-06-08
  Filled 2024-06-08: qty 1

## 2024-06-08 MED ORDER — EPOETIN ALFA-EPBX 10000 UNIT/ML IJ SOLN
10000.0000 [IU] | INTRAMUSCULAR | Status: DC
  Administered 2024-06-08: 10000 [IU] via SUBCUTANEOUS

## 2024-06-13 ENCOUNTER — Other Ambulatory Visit: Payer: Self-pay | Admitting: Family Medicine

## 2024-06-15 ENCOUNTER — Encounter (HOSPITAL_COMMUNITY)

## 2024-06-20 ENCOUNTER — Encounter (HOSPITAL_COMMUNITY): Payer: Self-pay

## 2024-06-22 ENCOUNTER — Encounter (HOSPITAL_COMMUNITY)
Admission: RE | Admit: 2024-06-22 | Discharge: 2024-06-22 | Disposition: A | Discharge status: Home / Self Care | Payer: Self-pay | Source: Ambulatory Visit | Attending: Nephrology | Admitting: Nephrology

## 2024-06-22 DIAGNOSIS — N184 Chronic kidney disease, stage 4 (severe): Secondary | ICD-10-CM | POA: Insufficient documentation

## 2024-06-22 LAB — POCT HEMOGLOBIN-HEMACUE: Hemoglobin: 12 g/dL (ref 12.0–15.0)

## 2024-06-22 MED ORDER — EPOETIN ALFA-EPBX 10000 UNIT/ML IJ SOLN
INTRAMUSCULAR | Status: AC
  Filled 2024-06-22: qty 1

## 2024-06-22 MED ORDER — EPOETIN ALFA-EPBX 10000 UNIT/ML IJ SOLN: 10000.0000 [IU] | INTRAMUSCULAR | Status: DC

## 2024-06-28 DIAGNOSIS — H40053 Ocular hypertension, bilateral: Secondary | ICD-10-CM | POA: Diagnosis not present

## 2024-06-28 LAB — OPHTHALMOLOGY REPORT-SCANNED

## 2024-06-30 ENCOUNTER — Ambulatory Visit (INDEPENDENT_AMBULATORY_CARE_PROVIDER_SITE_OTHER): Admitting: Podiatry

## 2024-06-30 ENCOUNTER — Encounter: Payer: Self-pay | Admitting: Podiatry

## 2024-06-30 DIAGNOSIS — K2210 Ulcer of esophagus without bleeding: Secondary | ICD-10-CM | POA: Diagnosis not present

## 2024-06-30 DIAGNOSIS — N2581 Secondary hyperparathyroidism of renal origin: Secondary | ICD-10-CM | POA: Diagnosis not present

## 2024-06-30 DIAGNOSIS — M79675 Pain in left toe(s): Secondary | ICD-10-CM | POA: Diagnosis not present

## 2024-06-30 DIAGNOSIS — E119 Type 2 diabetes mellitus without complications: Secondary | ICD-10-CM

## 2024-06-30 DIAGNOSIS — I129 Hypertensive chronic kidney disease with stage 1 through stage 4 chronic kidney disease, or unspecified chronic kidney disease: Secondary | ICD-10-CM | POA: Diagnosis not present

## 2024-06-30 DIAGNOSIS — B351 Tinea unguium: Secondary | ICD-10-CM

## 2024-06-30 DIAGNOSIS — M79674 Pain in right toe(s): Secondary | ICD-10-CM

## 2024-06-30 DIAGNOSIS — N184 Chronic kidney disease, stage 4 (severe): Secondary | ICD-10-CM | POA: Diagnosis not present

## 2024-06-30 DIAGNOSIS — R809 Proteinuria, unspecified: Secondary | ICD-10-CM | POA: Diagnosis not present

## 2024-06-30 DIAGNOSIS — D638 Anemia in other chronic diseases classified elsewhere: Secondary | ICD-10-CM | POA: Diagnosis not present

## 2024-06-30 DIAGNOSIS — E1122 Type 2 diabetes mellitus with diabetic chronic kidney disease: Secondary | ICD-10-CM | POA: Diagnosis not present

## 2024-06-30 LAB — LAB REPORT - SCANNED
Creatinine, POC: 61.1 mg/dL
EGFR: 31

## 2024-07-06 ENCOUNTER — Encounter (HOSPITAL_COMMUNITY): Payer: Self-pay

## 2024-07-06 NOTE — Progress Notes (Signed)
  Subjective:  Patient ID: Teresa Barnes, female    DOB: 05/24/1946,  MRN: [id number]  Teresa Barnes presents to clinic today for preventative diabetic foot care for painful thick toenails that are difficult to trim. Pain interferes with ambulation. Aggravating factors include wearing enclosed shoe gear. Pain is relieved with periodic professional debridement.  Chief Complaint  Patient presents with   Diabetes    DFC NIDDM A1C 6.2. LOV with PCP 02/2024.   New problem(s): None.   PCP is Jodie Lavern CROME, MD.  Allergies  Allergen Reactions   Prednisone Nausea And Vomiting   Tramadol  Nausea Only    Review of Systems: Negative except as noted in the HPI.  Objective: No changes noted in today's physical examination. There were no vitals filed for this visit. Teresa Barnes is a pleasant 78 y.o. female WD, WN in NAD. [name] x 3.  Vascular Examination: Capillary refill time immediate b/l. Vascular status intact b/l with palpable pedal pulses. Pedal hair present b/l. No pain with calf compression b/l. Skin temperature gradient WNL b/l. No cyanosis or clubbing b/l. No ischemia or gangrene noted b/l. No edema noted b/l LE.  Neurological Examination: Sensation grossly intact b/l with 10 gram monofilament. Vibratory sensation intact b/l.   Dermatological Examination: Pedal skin with normal turgor, texture and tone b/l.  No open wounds. No interdigital macerations.   Toenails 1-5 b/l thick, discolored, elongated with subungual debris and pain on dorsal palpation.   No corns, calluses, nor porokeratotic lesions.  Musculoskeletal Examination: Muscle strength 5/5 to all lower extremity muscle groups bilaterally. No pain, crepitus or joint limitation noted with ROM bilateral LE. No gross bony deformities bilaterally. Utilizes walker for ambulation assistance.  Radiographs: None  Last A1c:      Latest Ref Rng & Units 03/10/2024   11:45 AM 09/01/2023   11:53 AM  Hemoglobin A1C  Hemoglobin-A1c  4.6 - 6.5 % 6.1  5.6    Assessment/Plan: 1. Pain due to onychomycosis of toenails of both feet   2. Diabetes mellitus without complication [facility])   Patient was evaluated and treated. All patient's and/or POA's questions/concerns addressed on today's visit. Mycotic toenails 1-5 debrided in length and girth without incident.  Continue daily foot inspections and monitor blood glucose per PCP/Endocrinologist's recommendations.Continue soft, supportive shoe gear daily. Report any pedal injuries to medical professional. Call office if there are any quesitons/concerns. -Patient/POA to call should there be question/concern in the interim.   No follow-ups on file.  Delon CROME Merlin, DPM      [place] LOCATION: [address] [place] [address]                   Office [phone]   [facility] LOCATION: [address] [place], [place] [address] Office [phone]

## 2024-07-20 ENCOUNTER — Encounter (HOSPITAL_COMMUNITY)

## 2024-07-23 ENCOUNTER — Telehealth: Payer: Self-pay | Admitting: Pharmacy Technician

## 2024-07-23 NOTE — Telephone Encounter (Signed)
 Auth Submission: NO AUTH NEEDED Site of care: Site of care: [facility] Payer: [employer] MEDICARE Medication & CPT/J Code(s) submitted: [id number] RETACRIT  Diagnosis Code: N18.4 Route of submission (phone, fax, portal):  Phone # Fax # Auth type: Buy/Bill HB Units/visits requested:  Reference number:  Approval from: 05/12/24 to 11/17/24

## 2024-08-10 ENCOUNTER — Encounter (HOSPITAL_COMMUNITY): Payer: Self-pay

## 2024-08-26 ENCOUNTER — Other Ambulatory Visit (HOSPITAL_BASED_OUTPATIENT_CLINIC_OR_DEPARTMENT_OTHER): Payer: Self-pay

## 2024-08-26 MED ORDER — FLUZONE HIGH-DOSE 0.5 ML IM SUSY
0.5000 mL | PREFILLED_SYRINGE | Freq: Once | INTRAMUSCULAR | 0 refills | Status: AC
Start: 2024-08-26 — End: 2024-08-27
  Filled 2024-08-26: qty 0.5, 1d supply, fill #0

## 2024-09-09 ENCOUNTER — Encounter: Payer: Self-pay | Admitting: Family Medicine

## 2024-09-09 ENCOUNTER — Ambulatory Visit: Admitting: Family Medicine

## 2024-09-09 VITALS — BP 154/64 | HR 68 | Temp 98.5°F | Ht 64.0 in | Wt 168.6 lb

## 2024-09-09 DIAGNOSIS — E1159 Type 2 diabetes mellitus with other circulatory complications: Secondary | ICD-10-CM | POA: Diagnosis not present

## 2024-09-09 DIAGNOSIS — E782 Mixed hyperlipidemia: Secondary | ICD-10-CM

## 2024-09-09 DIAGNOSIS — I10 Essential (primary) hypertension: Secondary | ICD-10-CM

## 2024-09-09 DIAGNOSIS — E1122 Type 2 diabetes mellitus with diabetic chronic kidney disease: Secondary | ICD-10-CM

## 2024-09-09 DIAGNOSIS — I152 Hypertension secondary to endocrine disorders: Secondary | ICD-10-CM

## 2024-09-09 DIAGNOSIS — N184 Chronic kidney disease, stage 4 (severe): Secondary | ICD-10-CM | POA: Diagnosis not present

## 2024-09-09 DIAGNOSIS — E113593 Type 2 diabetes mellitus with proliferative diabetic retinopathy without macular edema, bilateral: Secondary | ICD-10-CM | POA: Insufficient documentation

## 2024-09-09 LAB — POCT GLYCOSYLATED HEMOGLOBIN (HGB A1C): Hemoglobin A1C: 5.7 % — AB (ref 4.0–5.6)

## 2024-09-09 MED ORDER — ROSUVASTATIN CALCIUM 20 MG PO TABS: 20.0000 mg | ORAL_TABLET | Freq: Every day | ORAL | Status: DC

## 2024-09-09 NOTE — Progress Notes (Signed)
 Subjective  CC:  Chief Complaint  Patient presents with   Diabetes   Hypertension    HPI: Teresa Barnes is a 78 y.o. female who presents to the office today for follow up of diabetes and problems listed above in the chief complaint.  Discussed the use of AI scribe software for clinical note transcription with the patient, who gave verbal consent to proceed.  History of Present Illness Teresa Barnes is an 78 year old female with hypertension and diabetes who presents for routine follow-up.  Lumbosacral radicular pain - Ongoing pain in the buttocks radiating to the back of the leg - Onset after an exercise session - Pain is bothersome but manageable with Tylenol , used on days with arthritis flares - Avoids prednisone and tramadol  due to prior adverse reactions  Hypertension - Elevated blood pressure readings during medical visits, most recent 160/154 mmHg - Attributes elevated readings to 'white coat syndrome' - Blood pressure tends to normalize after relaxation - No home blood pressure monitoring - same at recent nephrology visit: renal did not want to adjust her medications given h/o lows and hyperkalemia on losartan.   Diabetes mellitus - well controlled. Tolerating glp1.  - No chest pain - No visual or foot complications  Hyperlipidemia - Currently taking rosuvastatin  20 mg nightly for cholesterol management - Previously on incorrect 10 mg dose, now corrected. - LDL was above goal due to incorrect dose in April.    Peripheral edema - Not currently taking Lasix  for leg swelling, as it is not needed  Immunization status and vaccine reactions - Received influenza vaccine - Plans to receive COVID-19 vaccine soon - Experiences mild, transient cold-like symptoms after influenza vaccination  Dietary modifications and renal function - Increased intake of greens and beets - Believes dietary changes have positively impacted kidney function  Hematologic status - No  longer receiving monthly injections for blood count retacrit . Renal function is stable to slightly improved.     Wt Readings from Last 3 Encounters:  09/09/24 168 lb 9.6 oz (76.5 kg)  03/24/24 169 lb 6.4 oz (76.8 kg)  03/10/24 169 lb 6.4 oz (76.8 kg)    BP Readings from Last 3 Encounters:  09/09/24 (!) 154/64  06/22/24 (!) (P) 154/30  06/08/24 (!) 131/45    Assessment  1. Type 2 diabetes mellitus with stage 4 chronic kidney disease, without long-term current use of insulin  (HCC)   2. 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 CKD (chronic kidney disease) stage 4, GFR 15-29 ml/min (HCC)   4. Mixed hyperlipidemia   5. Hypertension associated with diabetes (HCC)   6. White coat syndrome with diagnosis of hypertension      Plan  Assessment and Plan Assessment & Plan Lumbosacral radiculopathy Chronic lumbosacral radiculopathy with pain radiating from the buttocks to the back of the leg. Pain is bothersome but manageable with acetaminophen . She avoids prednisone and tramadol  due to adverse reactions. - Continue acetaminophen  as needed for pain management, up to twice daily if necessary.  Chronic kidney disease, stage 4 Chronic kidney disease stage 4 with a focus on maintaining optimal blood pressure to protect kidney function. Blood pressure readings fluctuate, possibly due to white coat syndrome. - Monitor blood pressure at home with a cuff, potentially available through Medicare. On losartan farxiga  and ozempic   Hypertension Hypertension with fluctuating readings, possibly due to white coat syndrome. Current medication regimen  is not adjusted due to variable blood pressure readings. She has not taken her medication yet today. - Monitor blood pressure at home to ensure stability and avoid overtreatment.  Type 2 diabetes mellitus Type 2 diabetes mellitus is well-controlled   Continue meds and diet.   Mixed hyperlipidemia Mixed hyperlipidemia with previous dosing error of rosuvastatin . She was on 10 mg instead of the prescribed 20 mg. Currently taking the correct 20 mg dose. - Verify rosuvastatin  prescription is for 20 mg and take nightly. - Recheck cholesterol levels at next appointment.  General Health Maintenance Received flu vaccination. Plans to receive COVID-19 vaccination. Discussed normal post-vaccination symptoms such as feeling achy and cold-like symptoms. - Receive COVID-19 vaccination.    Follow up: 6 mo for recheck and cpe Orders Placed This Encounter  Procedures   POCT HgB A1C   No orders of the defined types were placed in this encounter.     Immunization History  Administered Date(s) Administered   Fluad Quad(high Dose 65+) 08/18/2019, 08/06/2021, 08/15/2022   Fluad Trivalent(High Dose 65+) 09/01/2023   INFLUENZA, HIGH DOSE SEASONAL PF 08/18/2017, 07/15/2018, 08/14/2020, 08/26/2024   Influenza,inj,Quad PF,6+ Mos 08/01/2016   Influenza-Unspecified 08/18/2014   Moderna Covid-19 Vaccine Bivalent Booster 30yrs & up 08/16/2021   Moderna Sars-Covid-2 Vaccination 01/01/2020, 01/29/2020, 09/25/2020   Pfizer(Comirnaty )Fall Seasonal Vaccine 12 years and older 08/01/2023   Pneumococcal Conjugate-13 12/09/2017   Pneumococcal Polysaccharide-23 09/24/2018   Zoster Recombinant(Shingrix) 10/21/2018, 01/27/2019    Diabetes Related Lab Review: Lab Results  Component Value Date   HGBA1C 5.7 (A) 09/09/2024   HGBA1C 6.1 03/10/2024   HGBA1C 5.6 09/01/2023    Lab Results  Component Value Date   MICROALBUR 7.3 (H) 03/10/2024   Lab Results  Component Value Date   CREATININE 1.85 (H) 03/10/2024   BUN 48 (H) 03/10/2024   NA 138 03/10/2024   K 4.4 03/10/2024   CL 105 03/10/2024   CO2 26 03/10/2024   Lab Results  Component Value Date   CHOL 183 03/10/2024   CHOL 156 02/28/2023   CHOL 143 05/15/2022   Lab Results  Component Value Date    HDL 71.60 03/10/2024   HDL 58.30 02/28/2023   HDL 60.10 05/15/2022   Lab Results  Component Value Date   LDLCALC 101 (H) 03/10/2024   LDLCALC 81 02/28/2023   LDLCALC 71 05/15/2022   Lab Results  Component Value Date   TRIG 51.0 03/10/2024   TRIG 80.0 02/28/2023   TRIG 61.0 05/15/2022   Lab Results  Component Value Date   CHOLHDL 3 03/10/2024   CHOLHDL 3 02/28/2023   CHOLHDL 2 05/15/2022   No results found for: LDLDIRECT The ASCVD Risk score (Arnett DK, et al., 2019) failed to calculate for the following reasons:   The 2019 ASCVD risk score is only valid for ages 51 to 39 I have reviewed the PMH, Fam and Soc history. Patient Active Problem List   Diagnosis Date Noted   Type 2 diabetes mellitus with stage 4 chronic kidney disease, without long-term current use of insulin  (HCC) 08/09/2020    Priority: High   CKD (chronic kidney disease) stage 4, GFR 15-29 ml/min (HCC) 05/31/2020    Priority: High    Normal ultrasound, 07/2020; referred to renal 05/2020    Mixed hyperlipidemia 12/09/2017    Priority: High   Hypertension associated with diabetes (HCC) 12/09/2017    Priority: High   H/O: upper GI bleed 07/30/2016    Priority: High    Due to severe  erosive esophagitis; 2017; healed with PPI by egd f/u; has HH and stricture; s/p blood transfusion. 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 Norleen Kiang; due to h/o severe recurrent erosive esophagitis.     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 Her smoking use included cigarettes. She started smoking about 41 years ago. She has a 2.5 pack-year smoking history. She has never used smokeless tobacco. She reports that she does not drink alcohol  and does not use drugs.  Review of Systems: Ophthalmic: negative for eye pain, loss of vision or double vision Cardiovascular: negative for chest pain Respiratory: negative for SOB or persistent cough Gastrointestinal: negative for abdominal pain Genitourinary: negative for dysuria or gross hematuria MSK: negative for foot lesions Neurologic: negative for weakness or gait disturbance  Objective  Vitals: BP (!) 154/64   Pulse 68   Temp 98.5 F (36.9 C)   Ht 5' 4 (1.626 m)   Wt 168 lb 9.6 oz (76.5 kg)   SpO2 97%   BMI 28.94 kg/m  General: well appearing, no acute distress  Psych:  Alert and oriented, normal mood and affect HEENT:  Normocephalic, atraumatic, moist mucous membranes,  supple neck  Cardiovascular:  Nl S1 and S2, RRR with murmur, no gallop or rub. no edema Respiratory:  Good breath sounds bilaterally, CTAB with normal effort, no rales Gastrointestinal: normal BS, soft, nontender Skin:  Warm, no rashes Neurologic:   Mental status is normal. normal gait Foot exam: no erythema, pallor, or cyanosis visible nl proprioception and sensation to monofilament testing bilaterally, +2 distal pulses bilaterally  Diabetic education: ongoing education regarding chronic disease management for diabetes was given today. We continue to reinforce the ABC's of diabetic management: A1c (<7 or 8 dependent upon patient), tight blood  pressure control, and cholesterol management with goal LDL < 100 minimally. We discuss diet strategies, exercise recommendations, medication options and possible side effects. At each visit, we review recommended immunizations and preventive care recommendations for diabetics and stress that good diabetic control can prevent other problems. See below for this patient's data. Commons side effects, risks, benefits, and alternatives for medications and treatment plan prescribed today were discussed, and the patient expressed understanding of the given instructions. Patient is instructed to call or message via MyChart if he/she has any questions or concerns regarding our treatment plan. No barriers to understanding were identified. We discussed Red Flag symptoms and signs in detail. Patient expressed understanding regarding what to do in case of urgent or emergency type symptoms.  Medication list was reconciled, printed and provided to the patient in AVS. Patient instructions and summary information was reviewed with the patient as documented in the AVS. This note was prepared with assistance of Dragon voice recognition software. Occasional wrong-word or sound-a-like substitutions may have occurred due to the inherent limitations of voice recognition software

## 2024-09-09 NOTE — Patient Instructions (Signed)
 Please return in 6 months for your annual complete physical; please come fasting.  For follow up on chronic medical conditions   If you have any questions or concerns, please don't hesitate to send me a message via MyChart or call the office at [phone]. Thank you for visiting with us  today! It's our pleasure caring for you.

## 2024-09-23 ENCOUNTER — Other Ambulatory Visit: Payer: Self-pay | Admitting: Family Medicine

## 2024-10-06 ENCOUNTER — Other Ambulatory Visit (HOSPITAL_BASED_OUTPATIENT_CLINIC_OR_DEPARTMENT_OTHER): Payer: Self-pay

## 2024-10-06 LAB — LAB REPORT - SCANNED
Albumin, Urine POC: 95.2
Creatinine, POC: 55.2 mg/dL
EGFR: 30
Microalb Creat Ratio: 172

## 2024-10-06 MED ORDER — COMIRNATY 30 MCG/0.3ML IM SUSY
0.3000 mL | PREFILLED_SYRINGE | Freq: Once | INTRAMUSCULAR | 0 refills | Status: AC
  Filled 2024-10-06: qty 0.3, 1d supply, fill #0

## 2024-10-12 ENCOUNTER — Other Ambulatory Visit (HOSPITAL_COMMUNITY): Payer: Self-pay | Admitting: Nephrology

## 2024-10-12 DIAGNOSIS — D631 Anemia in chronic kidney disease: Secondary | ICD-10-CM | POA: Insufficient documentation

## 2024-10-20 ENCOUNTER — Ambulatory Visit: Admitting: Podiatry

## 2024-10-20 DIAGNOSIS — Z91198 Patient's noncompliance with other medical treatment and regimen for other reason: Secondary | ICD-10-CM

## 2024-10-20 NOTE — Progress Notes (Signed)
 1. Failure to attend appointment with reason given    Appt canceled and rescheduled.

## 2024-10-25 ENCOUNTER — Inpatient Hospital Stay (HOSPITAL_COMMUNITY)
Admission: RE | Admit: 2024-10-25 | Discharge: 2024-10-25 | Disposition: A | Source: Ambulatory Visit | Attending: Nephrology

## 2024-10-25 VITALS — BP 124/44 | HR 55 | Temp 97.8°F | Resp 17

## 2024-10-25 DIAGNOSIS — D631 Anemia in chronic kidney disease: Secondary | ICD-10-CM

## 2024-10-25 LAB — POCT HEMOGLOBIN-HEMACUE: Hemoglobin: 9.1 g/dL — ABNORMAL LOW (ref 12.0–15.0)

## 2024-10-25 MED ORDER — EPOETIN ALFA-EPBX 10000 UNIT/ML IJ SOLN
10000.0000 [IU] | Freq: Once | INTRAMUSCULAR | Status: AC
  Administered 2024-10-25: 10000 [IU] via SUBCUTANEOUS

## 2024-10-25 MED ORDER — EPOETIN ALFA-EPBX 10000 UNIT/ML IJ SOLN
INTRAMUSCULAR | Status: AC
  Filled 2024-10-25: qty 1

## 2024-10-28 ENCOUNTER — Encounter: Payer: Self-pay | Admitting: Podiatry

## 2024-10-28 ENCOUNTER — Ambulatory Visit: Admitting: Podiatry

## 2024-10-28 DIAGNOSIS — M79674 Pain in right toe(s): Secondary | ICD-10-CM

## 2024-10-28 DIAGNOSIS — M79675 Pain in left toe(s): Secondary | ICD-10-CM

## 2024-10-28 DIAGNOSIS — E1122 Type 2 diabetes mellitus with diabetic chronic kidney disease: Secondary | ICD-10-CM | POA: Diagnosis not present

## 2024-10-28 DIAGNOSIS — N184 Chronic kidney disease, stage 4 (severe): Secondary | ICD-10-CM

## 2024-10-28 DIAGNOSIS — B351 Tinea unguium: Secondary | ICD-10-CM

## 2024-11-06 ENCOUNTER — Encounter: Payer: Self-pay | Admitting: Podiatry

## 2024-11-06 NOTE — Progress Notes (Signed)
"  °  Subjective:  Patient ID: Teresa Barnes, female    DOB: Sep 05, 1946,  MRN: [id number]  Teresa Barnes presents to clinic today for preventative diabetic foot care for painful mycotic toenails x 10 which interfere with daily activities. Pain is relieved with periodic professional debridement.  Chief Complaint  Patient presents with   Nail Problem    Thick painful toenails, 4 month follow up    Diabetes    A1C  5.7 - A1C last week was 5.1   New problem(s): None.   PCP is Jodie Lavern CROME, MD. Teresa Barnes 09/09/24.  Allergies[1]  Review of Systems: Negative except as noted in the HPI.  Objective: No changes noted in today's physical examination. There were no vitals filed for this visit. Teresa Barnes is a pleasant 78 y.o. female WD, WN in NAD. [name] x 3.  Vascular Examination: Capillary refill time immediate b/l. Vascular status intact b/l with palpable pedal pulses. Pedal hair present b/l. No pain with calf compression b/l. Skin temperature gradient WNL b/l. No cyanosis or clubbing b/l. No ischemia or gangrene noted b/l. No edema noted b/l LE.  Neurological Examination: Sensation grossly intact b/l with 10 gram monofilament. Vibratory sensation intact b/l.   Dermatological Examination: Pedal skin with normal turgor, texture and tone b/l.  No open wounds. No interdigital macerations.   Toenails 1-5 b/l thick, discolored, elongated with subungual debris and pain on dorsal palpation.   No corns, calluses, nor porokeratotic lesions.  Musculoskeletal Examination: Muscle strength 5/5 to all lower extremity muscle groups bilaterally. No pain, crepitus or joint limitation noted with ROM bilateral LE. No gross bony deformities bilaterally. Utilizes walker for ambulation assistance.  Radiographs: None  Assessment/Plan: 1. Pain due to onychomycosis of toenails of both feet   2. Type 2 diabetes mellitus with stage 4 chronic kidney disease, without long-term current use of insulin  [facility])   Consent  given for treatment. Patient examined. All patient's and/or POA's questions/concerns addressed on today's visit. Toenails 1-5 b/l debrided in length and girth without incident. Continue foot and shoe inspections daily. Monitor blood glucose per PCP/Endocrinologist's recommendations. Continue soft, supportive shoe gear daily. Report any pedal injuries to medical professional. Call office if there are any questions/concerns. -Patient/POA to call should there be question/concern in the interim.   Return in about 3 months (around 01/26/2025).  Teresa Barnes Merlin, DPM      [place] LOCATION: [address] [place] [address]                   Office [phone]   [facility] LOCATION: [address] [place], [place] [address] Office [phone]     [1]  Allergies Allergen Reactions   Prednisone Nausea And Vomiting   Tramadol  Nausea Only   "

## 2024-11-08 ENCOUNTER — Ambulatory Visit: Payer: Medicare Other

## 2024-11-08 VITALS — Ht 64.0 in | Wt 168.0 lb

## 2024-11-08 DIAGNOSIS — Z0000 Encounter for general adult medical examination without abnormal findings: Secondary | ICD-10-CM

## 2024-11-08 NOTE — Patient Instructions (Signed)
 Teresa Barnes,  Thank you for taking the time for your Medicare Wellness Visit. I appreciate your continued commitment to your health goals. Please review the care plan we discussed, and feel free to reach out if I can assist you further.  Please note that Annual Wellness Visits do not include a physical exam. Some assessments may be limited, especially if the visit was conducted virtually. If needed, we may recommend an in-person follow-up with your provider.  Ongoing Care Seeing your primary care provider every 3 to 6 months helps us  monitor your health and provide consistent, personalized care.   Referrals If a referral was made during today's visit and you haven't received any updates within two weeks, please contact the referred provider directly to check on the status.  Recommended Screenings:  Health Maintenance  Topic Date Due   Eye exam for diabetics  06/29/2024   Breast Cancer Screening  11/25/2024   Hemoglobin A1C  03/10/2025   COVID-19 Vaccine (7 - 2025-26 season) 04/05/2025   Complete foot exam   09/09/2025   Yearly kidney function blood test for diabetes  10/06/2025   Yearly kidney health urinalysis for diabetes  10/06/2025   Medicare Annual Wellness Visit  11/08/2025   Pneumococcal Vaccine for age over 58  Completed   Flu Shot  Completed   Osteoporosis screening with Bone Density Scan  Completed   Zoster (Shingles) Vaccine  Completed   Meningitis B Vaccine  Aged Out   DTaP/Tdap/Td vaccine  Discontinued   Colon Cancer Screening  Discontinued   Hepatitis C Screening  Discontinued       10/29/2023    1:43 PM  Advanced Directives  Does Patient Have a Medical Advance Directive? No  Would patient like information on creating a medical advance directive? No - Patient declined    Vision: Annual vision screenings are recommended for early detection of glaucoma, cataracts, and diabetic retinopathy. These exams can also reveal signs of chronic conditions such as diabetes and  high blood pressure.  Dental: Annual dental screenings help detect early signs of oral cancer, gum disease, and other conditions linked to overall health, including heart disease and diabetes.  Please see the attached documents for additional preventive care recommendations.

## 2024-11-08 NOTE — Progress Notes (Signed)
 "  Chief Complaint  Patient presents with   Medicare Wellness     Subjective:   Teresa Barnes is a 78 y.o. female who presents for a Medicare Annual Wellness Visit.  Visit info / Clinical Intake: Medicare Wellness Visit Type:: Subsequent Annual Wellness Visit Persons participating in visit and providing information:: patient Medicare Wellness Visit Mode:: Telephone If telephone:: video declined Since this visit was completed virtually, some vitals may be partially provided or unavailable. Missing vitals are due to the limitations of the virtual format.: Unable to obtain vitals - no equipment If Telephone or Video please confirm:: I connected with patient using audio/video enable telemedicine. I verified patient identity with two identifiers, discussed telehealth limitations, and patient agreed to proceed. Patient Location:: home Provider Location:: home office Interpreter Needed?: No Pre-visit prep was completed: yes AWV questionnaire completed by patient prior to visit?: no Living arrangements:: lives with spouse/significant other Patient's Overall Health Status Rating: very good Typical amount of pain: some Does pain affect daily life?: no Are you currently prescribed opioids?: no  Dietary Habits and Nutritional Risks How many meals a day?: 3 Eats fruit and vegetables daily?: yes Most meals are obtained by: preparing own meals In the last 2 weeks, have you had any of the following?: none Diabetic:: (!) yes Any non-healing wounds?: no How often do you check your BS?: as needed Would you like to be referred to a Nutritionist or for Diabetic Management? : no  Functional Status Activities of Daily Living (to include ambulation/medication): Independent Ambulation: Independent with device- listed below Home Assistive Devices/Equipment: Walker (specify Type); Eyeglasses Medication Administration: Independent Home Management (perform basic housework or laundry):  Independent Manage your own finances?: yes Primary transportation is: driving Concerns about vision?: no *vision screening is required for WTM* Concerns about hearing?: no  Fall Screening Falls in the past year?: 0 Number of falls in past year: 0 Was there an injury with Fall?: 0 Fall Risk Category Calculator: 0 Patient Fall Risk Level: Low Fall Risk  Fall Risk Patient at Risk for Falls Due to: Impaired balance/gait Fall risk Follow up: Falls evaluation completed  Home and Transportation Safety: All rugs have non-skid backing?: N/A, no rugs All stairs or steps have railings?: yes Grab bars in the bathtub or shower?: yes Have non-skid surface in bathtub or shower?: yes Good home lighting?: yes Regular seat belt use?: yes Hospital stays in the last year:: no  Cognitive Assessment Difficulty concentrating, remembering, or making decisions? : no Will 6CIT or Mini Cog be Completed: yes What year is it?: 0 points What month is it?: 0 points Give patient an address phrase to remember (5 components): [address] [place]  About what time is it?: 0 points Count backwards from 20 to 1: 0 points Say the months of the year in reverse: 0 points Repeat the address phrase from earlier: 0 points 6 CIT Score: 0 points  Reviewed/Updated  Reviewed/Updated: Reviewed All (Medical, Surgical, Family, Medications, Allergies, Care Teams, Patient Goals)    Allergies (verified) Prednisone and Tramadol    Current Medications (verified) Outpatient Encounter Medications as of 11/08/2024  Medication Sig   dorzolamide-timolol  (COSOPT) 22.3-6.8 MG/ML ophthalmic solution Place 1 drop into both eyes 2 (two) times daily.   FARXIGA  10 MG TABS tablet Take 1 tablet by mouth once daily   latanoprost  (XALATAN ) 0.005 % ophthalmic solution Place 1 drop into both eyes at bedtime.   losartan (COZAAR) 50 MG tablet Take 50 mg by mouth daily.   omeprazole  (  PRILOSEC) 20 MG capsule Take 20 mg by mouth daily.    OZEMPIC , 0.25 OR 0.5 MG/DOSE, 2 MG/3ML SOPN Inject 0.5 mg into the skin once a week.   rosuvastatin  (CRESTOR ) 20 MG tablet TAKE 1 TABLET BY MOUTH AT BEDTIME   vitamin B-12 (CYANOCOBALAMIN ) 500 MCG tablet Take 500 mcg by mouth every other day.   Vitamin D, Cholecalciferol, 25 MCG (1000 UT) TABS Take 1 tablet by mouth daily.   furosemide  (LASIX ) 40 MG tablet Take 40 mg by mouth daily as needed. (Patient not taking: Reported on 11/08/2024)   No facility-administered encounter medications on file as of 11/08/2024.    History: Past Medical History:  Diagnosis Date   Anemia    Anemia of chronic disease 09/24/2018   Anxiety    Blood transfusion without reported diagnosis 07/2016   Cancer [facility]) 1991   breast   Chronic gout 12/09/2017   On allopurinol    Diabetes mellitus without complication (HCC)    Esophageal stricture 12/09/2017   S/p dilation by GI: Dr. Norleen Kiang; due to h/o severe recurrent erosive esophagitis.   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   Injected left foot every 19 2018   GERD (gastroesophageal reflux disease)    Glaucoma    Hiatal hernia 12/09/2017   Hyperlipidemia    Hypertension    Right rotator cuff tear 11/24/2015   Injected this and 26 2017   Wears glasses    Past Surgical History:  Procedure Laterality Date   ANTERIOR CERVICAL DECOMP/DISCECTOMY FUSION N/A 03/26/2019   Procedure: Anterior Cervical Discectomy Fusion - Cervical Three-Cervical Four - Cervical Four-Cervical Five - Cervical Five-Cervical Six;  Surgeon: Joshua Alm RAMAN, MD;  Location: [facility] OR;  Service: Neurosurgery;  Laterality: N/A;  Anterior Cervical Discectomy Fusion - Cervical Three-Cervical Four - Cervical Four-Cervical Five - Cervical Five-Cervical Six   CATARACT EXTRACTION W/ INTRAOCULAR LENS  IMPLANT, BILATERAL  2000   COLONOSCOPY     cyst on spine     ESOPHAGOGASTRODUODENOSCOPY (EGD) WITH PROPOFOL  N/A 07/31/2016   Procedure: ESOPHAGOGASTRODUODENOSCOPY (EGD) WITH PROPOFOL ;  Surgeon: Elspeth Deward Naval, MD;  Location: [facility];  Service: Gastroenterology;  Laterality: N/A;   EYE SURGERY  2011   lt eye blled   MASTECTOMY MODIFIED RADICAL Right 1990   REDUCTION MAMMAPLASTY Left    1990   TRIGGER FINGER RELEASE Right 06/24/2013   Procedure: RELEASE TRIGGER FINGER/A-1 PULLEY PIP RIGHT LONG FINGER;  Surgeon: Lamar LULLA Leonor Mickey., MD;  Location: [facility];  Service: Orthopedics;  Laterality: Right;   TRIGGER FINGER RELEASE     TUBAL LIGATION     Family History  Problem Relation Age of Onset   Diabetes Mother    Diabetes Daughter    Arthritis Daughter    Hyperlipidemia Daughter    Diabetes Son    Arthritis Son    Diabetes Sister    Colon cancer Neg Hx    Cancer Neg Hx    Heart disease Neg Hx    Breast cancer Neg Hx    Stomach cancer Neg Hx    Pancreatic cancer Neg Hx    Esophageal cancer Neg Hx    Liver disease Neg Hx    Social History   Occupational History   Occupation: retired public librarian; logistician    Comment: 2006  Tobacco Use   Smoking status: Former    Current packs/day: 0.00    Average packs/day: 0.3 packs/day for 10.0 years (2.5 ttl pk-yrs)    Types: Cigarettes  Start date: 06/19/1983    Quit date: 06/18/1993    Years since quitting: 31.4   Smokeless tobacco: Never  Vaping Use   Vaping status: Never Used  Substance and Sexual Activity   Alcohol  use: No   Drug use: No   Sexual activity: Never   Tobacco Counseling Counseling given: Not Answered  SDOH Screenings   Food Insecurity: No Food Insecurity (11/08/2024)  Housing: Low Risk (11/08/2024)  Transportation Needs: No Transportation Needs (11/08/2024)  Utilities: Not At Risk (11/08/2024)  Depression (PHQ2-9): Low Risk (11/08/2024)  Financial Resource Strain: Low Risk (10/29/2023)  Physical Activity: Inactive (11/08/2024)  Social Connections: Moderately Integrated (11/08/2024)  Stress: No Stress Concern Present (11/08/2024)  Tobacco Use: Medium Risk (11/08/2024)   Health Literacy: Adequate Health Literacy (11/08/2024)   See flowsheets for full screening details  Depression Screen PHQ 2 & 9 Depression Scale- Over the past 2 weeks, how often have you been bothered by any of the following problems? Little interest or pleasure in doing things: 0 Feeling down, depressed, or hopeless (PHQ Adolescent also includes...irritable): 0 PHQ-2 Total Score: 0     Goals Addressed               This Visit's Progress     going back to the [employer] (pt-stated)        Going back to the [facility]              Objective:    Today's Vitals   11/08/24 1253  Weight: 168 lb (76.2 kg)  Height: 5' 4 (1.626 m)   Body mass index is 28.84 kg/m.  Hearing/Vision screen Hearing Screening - Comments:: Pt denies any hearing issues  Vision Screening - Comments:: Wears rx glasses - up to date with routine eye exams with  Dr Regenia  Immunizations and Health Maintenance Health Maintenance  Topic Date Due   OPHTHALMOLOGY EXAM  06/29/2024   Mammogram  11/25/2024   HEMOGLOBIN A1C  03/10/2025   COVID-19 Vaccine (7 - 2025-26 season) 04/05/2025   FOOT EXAM  09/09/2025   Diabetic kidney evaluation - eGFR measurement  10/06/2025   Diabetic kidney evaluation - Urine ACR  10/06/2025   Medicare Annual Wellness (AWV)  11/08/2025   Pneumococcal Vaccine: 50+ Years  Completed   Influenza Vaccine  Completed   Bone Density Scan  Completed   Zoster Vaccines- Shingrix  Completed   Meningococcal B Vaccine  Aged Out   DTaP/Tdap/Td  Discontinued   Colonoscopy  Discontinued   Hepatitis C Screening  Discontinued        Assessment/Plan:  This is a routine wellness examination for Aliza.  Patient Care Team: Jodie Lavern CROME, MD as PCP - General (Family Medicine) Claudene Arthea HERO, DO as Consulting Physician (Family Medicine) Abran Norleen SAILOR, MD as Consulting Physician (Gastroenterology) Alvia Norleen BIRCH, MD as Consulting Physician (Ophthalmology) Gaynel Delon CROME, DPM as  Consulting Physician (Podiatry) Joshua Alm Hamilton, MD as Consulting Physician (Neurosurgery) Jerrye Burnard HERO, NP as Nurse Practitioner (Nephrology) Jerrye Katheryn BROCKS, MD as Consulting Physician (Nephrology)  I have personally reviewed and noted the following in the patients chart:   Medical and social history Use of alcohol , tobacco or illicit drugs  Current medications and supplements including opioid prescriptions. Functional ability and status Nutritional status Physical activity Advanced directives List of other physicians Hospitalizations, surgeries, and ER visits in previous 12 months Vitals Screenings to include cognitive, depression, and falls Referrals and appointments  No orders of the defined types were placed in this  encounter.  In addition, I have reviewed and discussed with patient certain preventive protocols, quality metrics, and best practice recommendations. A written personalized care plan for preventive services as well as general preventive health recommendations were provided to patient.   Ellouise VEAR Haws, LPN   [id number]   Return in about 53 weeks (around 11/14/2025).  After Visit Summary: (Declined) Due to this being a telephonic visit, with patients personalized plan was offered to patient but patient Declined AVS at this time   Nurse Notes: HM Addressed: requested ophthalmology results   "

## 2024-11-22 ENCOUNTER — Ambulatory Visit (HOSPITAL_COMMUNITY)
Admission: RE | Admit: 2024-11-22 | Discharge: 2024-11-22 | Disposition: A | Discharge status: Home / Self Care | Source: Ambulatory Visit | Attending: Nephrology | Admitting: Nephrology

## 2024-11-22 VITALS — BP 123/50 | HR 54 | Temp 97.2°F | Resp 16

## 2024-11-22 DIAGNOSIS — D631 Anemia in chronic kidney disease: Secondary | ICD-10-CM | POA: Diagnosis present

## 2024-11-22 DIAGNOSIS — N184 Chronic kidney disease, stage 4 (severe): Secondary | ICD-10-CM | POA: Insufficient documentation

## 2024-11-22 LAB — IRON AND TIBC
Iron: 63 ug/dL (ref 28–170)
Saturation Ratios: 21 % (ref 10.4–31.8)
TIBC: 293 ug/dL (ref 250–450)
UIBC: 230 ug/dL

## 2024-11-22 LAB — POCT HEMOGLOBIN-HEMACUE: Hemoglobin: 9.3 g/dL — ABNORMAL LOW (ref 12.0–15.0)

## 2024-11-22 LAB — FERRITIN: Ferritin: 101 ng/mL (ref 11–307)

## 2024-11-22 MED ORDER — EPOETIN ALFA-EPBX 10000 UNIT/ML IJ SOLN
10000.0000 [IU] | Freq: Once | INTRAMUSCULAR | Status: AC
  Administered 2024-11-22: 10000 [IU] via SUBCUTANEOUS

## 2024-11-22 MED ORDER — EPOETIN ALFA-EPBX 10000 UNIT/ML IJ SOLN
INTRAMUSCULAR | Status: AC
  Filled 2024-11-22: qty 1

## 2024-11-29 ENCOUNTER — Other Ambulatory Visit (HOSPITAL_COMMUNITY): Payer: Self-pay | Admitting: Nephrology

## 2024-11-29 ENCOUNTER — Telehealth (HOSPITAL_COMMUNITY): Payer: Self-pay | Admitting: Pharmacy Technician

## 2024-11-29 NOTE — Progress Notes (Signed)
 Received updated Retacrit  orders  Dose change: 10000 units SQ every 2 weeks  Labs; POCT HgB every visit; iron panel and ferritin monthly  Last dose 11/22/2024 Next scheduled dose is 12/20/24 but will need to move to sooner if possible.  Sherry Pennant, PharmD, MPH, BCPS, CPP Clinical Pharmacist

## 2024-11-29 NOTE — Telephone Encounter (Signed)
 Auth Submission: NO AUTH NEEDED Site of care: [facility] Payer: [employer] MEDICARE Medication & CPT/J Code(s) submitted: [id number] - RETACRIT  NON-ESRD  Diagnosis Code:  Route of submission (phone, fax, portal):  Phone # Fax # Auth type: Buy/Bill HB Units/visits requested: 10,000 units every 2 weeks (dose change) Reference number: [id number] Approval from: 11/29/2024 to 03/29/25    Dagoberto Armour, CPhT [facility] Phone: [phone] 11/29/2024

## 2024-12-07 ENCOUNTER — Encounter (HOSPITAL_COMMUNITY)
Admission: RE | Admit: 2024-12-07 | Discharge: 2024-12-07 | Disposition: A | Discharge status: Home / Self Care | Source: Ambulatory Visit | Attending: Nephrology | Admitting: Nephrology

## 2024-12-07 VITALS — BP 148/39 | HR 63 | Temp 97.4°F | Resp 17

## 2024-12-07 DIAGNOSIS — N184 Chronic kidney disease, stage 4 (severe): Secondary | ICD-10-CM | POA: Insufficient documentation

## 2024-12-07 DIAGNOSIS — D631 Anemia in chronic kidney disease: Secondary | ICD-10-CM | POA: Insufficient documentation

## 2024-12-07 LAB — POCT HEMOGLOBIN-HEMACUE: Hemoglobin: 10 g/dL — ABNORMAL LOW (ref 12.0–15.0)

## 2024-12-07 MED ORDER — EPOETIN ALFA-EPBX 10000 UNIT/ML IJ SOLN
INTRAMUSCULAR | Status: AC
  Filled 2024-12-07: qty 1

## 2024-12-07 MED ORDER — EPOETIN ALFA-EPBX 10000 UNIT/ML IJ SOLN
10000.0000 [IU] | Freq: Once | INTRAMUSCULAR | Status: AC
  Administered 2024-12-07: 10000 [IU] via SUBCUTANEOUS

## 2024-12-20 ENCOUNTER — Inpatient Hospital Stay (HOSPITAL_COMMUNITY): Admission: RE | Admit: 2024-12-20 | Source: Ambulatory Visit

## 2024-12-31 ENCOUNTER — Encounter (HOSPITAL_COMMUNITY)

## 2025-01-03 ENCOUNTER — Encounter (HOSPITAL_COMMUNITY)

## 2025-01-27 ENCOUNTER — Ambulatory Visit: Admitting: Podiatry

## 2025-11-14 ENCOUNTER — Ambulatory Visit
